# Patient Record
Sex: Female | Born: 1950 | Race: Black or African American | Hispanic: No | Marital: Single | Smoking: Current every day smoker
Health system: Southern US, Community
[De-identification: ages and names within clinical notes are randomized; demographics above are authoritative.]

## PROBLEM LIST (undated history)

## (undated) DIAGNOSIS — F329 Major depressive disorder, single episode, unspecified: Secondary | ICD-10-CM

## (undated) DIAGNOSIS — J9610 Chronic respiratory failure, unspecified whether with hypoxia or hypercapnia: Secondary | ICD-10-CM

## (undated) DIAGNOSIS — I10 Essential (primary) hypertension: Secondary | ICD-10-CM

## (undated) DIAGNOSIS — R7303 Prediabetes: Secondary | ICD-10-CM

## (undated) DIAGNOSIS — I5032 Chronic diastolic (congestive) heart failure: Secondary | ICD-10-CM

## (undated) DIAGNOSIS — N183 Chronic kidney disease, stage 3 unspecified: Secondary | ICD-10-CM

## (undated) DIAGNOSIS — K5520 Angiodysplasia of colon without hemorrhage: Secondary | ICD-10-CM

## (undated) DIAGNOSIS — F419 Anxiety disorder, unspecified: Secondary | ICD-10-CM

## (undated) DIAGNOSIS — M1990 Unspecified osteoarthritis, unspecified site: Secondary | ICD-10-CM

## (undated) DIAGNOSIS — E785 Hyperlipidemia, unspecified: Secondary | ICD-10-CM

## (undated) DIAGNOSIS — F32A Depression, unspecified: Secondary | ICD-10-CM

## (undated) DIAGNOSIS — J8410 Pulmonary fibrosis, unspecified: Secondary | ICD-10-CM

## (undated) DIAGNOSIS — D649 Anemia, unspecified: Secondary | ICD-10-CM

## (undated) DIAGNOSIS — R51 Headache: Secondary | ICD-10-CM

## (undated) DIAGNOSIS — R0602 Shortness of breath: Secondary | ICD-10-CM

## (undated) DIAGNOSIS — K219 Gastro-esophageal reflux disease without esophagitis: Secondary | ICD-10-CM

## (undated) DIAGNOSIS — I480 Paroxysmal atrial fibrillation: Secondary | ICD-10-CM

## (undated) HISTORY — DX: Chronic respiratory failure, unspecified whether with hypoxia or hypercapnia: J96.10

## (undated) HISTORY — DX: Angiodysplasia of colon without hemorrhage: K55.20

## (undated) HISTORY — PX: HEMORRHOID SURGERY: SHX153

---

## 1999-11-10 ENCOUNTER — Encounter: Payer: Self-pay | Admitting: Emergency Medicine

## 1999-11-10 ENCOUNTER — Emergency Department (HOSPITAL_COMMUNITY): Admission: EM | Admit: 1999-11-10 | Discharge: 1999-11-10 | Payer: Self-pay | Admitting: Emergency Medicine

## 2000-07-15 ENCOUNTER — Emergency Department (HOSPITAL_COMMUNITY): Admission: EM | Admit: 2000-07-15 | Discharge: 2000-07-15 | Payer: Self-pay | Admitting: Emergency Medicine

## 2001-03-19 ENCOUNTER — Encounter: Payer: Self-pay | Admitting: Emergency Medicine

## 2001-03-19 ENCOUNTER — Emergency Department (HOSPITAL_COMMUNITY): Admission: EM | Admit: 2001-03-19 | Discharge: 2001-03-19 | Payer: Self-pay | Admitting: Internal Medicine

## 2001-09-30 ENCOUNTER — Emergency Department (HOSPITAL_COMMUNITY): Admission: EM | Admit: 2001-09-30 | Discharge: 2001-09-30 | Payer: Self-pay | Admitting: *Deleted

## 2003-04-22 ENCOUNTER — Encounter: Payer: Self-pay | Admitting: Emergency Medicine

## 2003-04-22 ENCOUNTER — Emergency Department (HOSPITAL_COMMUNITY): Admission: EM | Admit: 2003-04-22 | Discharge: 2003-04-22 | Payer: Self-pay | Admitting: Emergency Medicine

## 2004-12-26 ENCOUNTER — Ambulatory Visit: Payer: Self-pay | Admitting: Family Medicine

## 2004-12-27 ENCOUNTER — Ambulatory Visit: Payer: Self-pay | Admitting: *Deleted

## 2005-01-01 ENCOUNTER — Ambulatory Visit: Payer: Self-pay | Admitting: Family Medicine

## 2005-01-07 ENCOUNTER — Ambulatory Visit: Payer: Self-pay | Admitting: Family Medicine

## 2005-01-16 ENCOUNTER — Ambulatory Visit: Payer: Self-pay | Admitting: Family Medicine

## 2005-01-21 ENCOUNTER — Ambulatory Visit: Payer: Self-pay | Admitting: Family Medicine

## 2005-01-29 ENCOUNTER — Ambulatory Visit: Payer: Self-pay | Admitting: Family Medicine

## 2005-02-04 ENCOUNTER — Ambulatory Visit: Payer: Self-pay | Admitting: Family Medicine

## 2005-02-13 ENCOUNTER — Ambulatory Visit: Payer: Self-pay | Admitting: Family Medicine

## 2005-02-28 ENCOUNTER — Ambulatory Visit: Payer: Self-pay | Admitting: Family Medicine

## 2005-03-06 ENCOUNTER — Ambulatory Visit: Payer: Self-pay | Admitting: Family Medicine

## 2005-03-24 ENCOUNTER — Ambulatory Visit: Payer: Self-pay | Admitting: Family Medicine

## 2005-03-24 LAB — CONVERTED CEMR LAB: Pap Smear: NORMAL

## 2005-04-01 ENCOUNTER — Ambulatory Visit (HOSPITAL_COMMUNITY): Admission: RE | Admit: 2005-04-01 | Discharge: 2005-04-01 | Payer: Self-pay | Admitting: Family Medicine

## 2005-04-02 ENCOUNTER — Ambulatory Visit: Payer: Self-pay | Admitting: Family Medicine

## 2005-04-02 ENCOUNTER — Ambulatory Visit (HOSPITAL_COMMUNITY): Admission: RE | Admit: 2005-04-02 | Discharge: 2005-04-02 | Payer: Self-pay | Admitting: Internal Medicine

## 2005-04-10 ENCOUNTER — Ambulatory Visit: Payer: Self-pay | Admitting: Family Medicine

## 2005-04-14 ENCOUNTER — Ambulatory Visit: Payer: Self-pay | Admitting: Family Medicine

## 2005-04-15 ENCOUNTER — Ambulatory Visit (HOSPITAL_COMMUNITY): Admission: RE | Admit: 2005-04-15 | Discharge: 2005-04-15 | Payer: Self-pay | Admitting: Internal Medicine

## 2005-04-17 ENCOUNTER — Ambulatory Visit: Payer: Self-pay | Admitting: Family Medicine

## 2005-04-28 ENCOUNTER — Ambulatory Visit: Payer: Self-pay | Admitting: Family Medicine

## 2005-05-05 ENCOUNTER — Ambulatory Visit: Payer: Self-pay | Admitting: Family Medicine

## 2005-06-02 ENCOUNTER — Ambulatory Visit: Payer: Self-pay | Admitting: Family Medicine

## 2005-06-30 ENCOUNTER — Ambulatory Visit: Payer: Self-pay | Admitting: Family Medicine

## 2005-07-23 ENCOUNTER — Ambulatory Visit: Payer: Self-pay | Admitting: Internal Medicine

## 2005-08-21 ENCOUNTER — Ambulatory Visit: Payer: Self-pay | Admitting: Internal Medicine

## 2005-09-17 ENCOUNTER — Ambulatory Visit: Payer: Self-pay | Admitting: Family Medicine

## 2005-10-20 ENCOUNTER — Ambulatory Visit: Payer: Self-pay | Admitting: Family Medicine

## 2005-11-19 ENCOUNTER — Ambulatory Visit: Payer: Self-pay | Admitting: Family Medicine

## 2005-12-11 ENCOUNTER — Ambulatory Visit: Payer: Self-pay | Admitting: Family Medicine

## 2006-01-01 ENCOUNTER — Ambulatory Visit: Payer: Self-pay | Admitting: Family Medicine

## 2006-01-29 ENCOUNTER — Ambulatory Visit: Payer: Self-pay | Admitting: Family Medicine

## 2006-02-19 ENCOUNTER — Ambulatory Visit: Payer: Self-pay | Admitting: Family Medicine

## 2006-02-23 ENCOUNTER — Ambulatory Visit: Payer: Self-pay | Admitting: Family Medicine

## 2006-03-02 ENCOUNTER — Ambulatory Visit: Payer: Self-pay | Admitting: Family Medicine

## 2006-03-09 ENCOUNTER — Ambulatory Visit: Payer: Self-pay | Admitting: Family Medicine

## 2006-03-12 ENCOUNTER — Ambulatory Visit: Payer: Self-pay | Admitting: Family Medicine

## 2006-03-19 ENCOUNTER — Ambulatory Visit: Payer: Self-pay | Admitting: Family Medicine

## 2006-04-02 ENCOUNTER — Ambulatory Visit: Payer: Self-pay | Admitting: Internal Medicine

## 2006-04-23 ENCOUNTER — Ambulatory Visit: Payer: Self-pay | Admitting: Internal Medicine

## 2006-05-21 ENCOUNTER — Ambulatory Visit: Payer: Self-pay | Admitting: Internal Medicine

## 2006-06-16 ENCOUNTER — Emergency Department (HOSPITAL_COMMUNITY): Admission: EM | Admit: 2006-06-16 | Discharge: 2006-06-16 | Payer: Self-pay | Admitting: Emergency Medicine

## 2006-06-18 ENCOUNTER — Ambulatory Visit: Payer: Self-pay | Admitting: Internal Medicine

## 2006-07-13 ENCOUNTER — Ambulatory Visit: Payer: Self-pay | Admitting: Family Medicine

## 2006-07-15 ENCOUNTER — Ambulatory Visit: Payer: Self-pay | Admitting: Family Medicine

## 2006-07-22 ENCOUNTER — Ambulatory Visit: Payer: Self-pay | Admitting: Family Medicine

## 2006-08-18 ENCOUNTER — Ambulatory Visit: Payer: Self-pay | Admitting: Family Medicine

## 2006-09-16 ENCOUNTER — Ambulatory Visit: Payer: Self-pay | Admitting: Family Medicine

## 2006-10-14 ENCOUNTER — Ambulatory Visit: Payer: Self-pay | Admitting: Family Medicine

## 2006-11-11 ENCOUNTER — Ambulatory Visit: Payer: Self-pay | Admitting: Family Medicine

## 2006-12-09 ENCOUNTER — Ambulatory Visit: Payer: Self-pay | Admitting: Family Medicine

## 2007-01-06 ENCOUNTER — Ambulatory Visit: Payer: Self-pay | Admitting: Family Medicine

## 2007-01-16 ENCOUNTER — Emergency Department (HOSPITAL_COMMUNITY): Admission: EM | Admit: 2007-01-16 | Discharge: 2007-01-16 | Payer: Self-pay | Admitting: Emergency Medicine

## 2007-01-20 ENCOUNTER — Ambulatory Visit: Payer: Self-pay | Admitting: Family Medicine

## 2007-02-15 ENCOUNTER — Ambulatory Visit: Payer: Self-pay | Admitting: Family Medicine

## 2007-03-22 ENCOUNTER — Ambulatory Visit: Payer: Self-pay | Admitting: Family Medicine

## 2007-04-05 ENCOUNTER — Ambulatory Visit: Payer: Self-pay | Admitting: Family Medicine

## 2007-05-03 ENCOUNTER — Ambulatory Visit: Payer: Self-pay | Admitting: Family Medicine

## 2007-05-12 ENCOUNTER — Ambulatory Visit: Payer: Self-pay | Admitting: Family Medicine

## 2007-05-13 ENCOUNTER — Encounter (INDEPENDENT_AMBULATORY_CARE_PROVIDER_SITE_OTHER): Payer: Self-pay | Admitting: Family Medicine

## 2007-05-13 DIAGNOSIS — Z8679 Personal history of other diseases of the circulatory system: Secondary | ICD-10-CM

## 2007-05-13 DIAGNOSIS — M109 Gout, unspecified: Secondary | ICD-10-CM | POA: Insufficient documentation

## 2007-05-13 DIAGNOSIS — I10 Essential (primary) hypertension: Secondary | ICD-10-CM | POA: Insufficient documentation

## 2007-05-13 DIAGNOSIS — F17210 Nicotine dependence, cigarettes, uncomplicated: Secondary | ICD-10-CM

## 2007-05-13 DIAGNOSIS — E785 Hyperlipidemia, unspecified: Secondary | ICD-10-CM | POA: Insufficient documentation

## 2007-05-13 DIAGNOSIS — K644 Residual hemorrhoidal skin tags: Secondary | ICD-10-CM | POA: Insufficient documentation

## 2007-05-13 DIAGNOSIS — Z9189 Other specified personal risk factors, not elsewhere classified: Secondary | ICD-10-CM | POA: Insufficient documentation

## 2007-05-13 DIAGNOSIS — F329 Major depressive disorder, single episode, unspecified: Secondary | ICD-10-CM | POA: Insufficient documentation

## 2007-05-26 ENCOUNTER — Ambulatory Visit: Payer: Self-pay | Admitting: Family Medicine

## 2007-06-09 ENCOUNTER — Ambulatory Visit: Payer: Self-pay | Admitting: Family Medicine

## 2007-06-16 ENCOUNTER — Ambulatory Visit: Payer: Self-pay | Admitting: Family Medicine

## 2007-06-29 ENCOUNTER — Ambulatory Visit: Payer: Self-pay | Admitting: Internal Medicine

## 2007-06-29 LAB — CONVERTED CEMR LAB
Alkaline Phosphatase: 86 units/L (ref 39–117)
CO2: 23 meq/L (ref 19–32)
Cholesterol: 155 mg/dL (ref 0–200)
Creatinine, Ser: 1.03 mg/dL (ref 0.40–1.20)
Glucose, Bld: 83 mg/dL (ref 70–99)
HDL: 47 mg/dL (ref >39)
LDL Cholesterol: 88 mg/dL (ref 0–99)
Sodium: 143 meq/L (ref 135–145)
Total Bilirubin: 0.4 mg/dL (ref 0.3–1.2)
Total CHOL/HDL Ratio: 3.3
Total Protein: 7.4 g/dL (ref 6.0–8.3)
Triglycerides: 101 mg/dL (ref <150)
VLDL: 20 mg/dL (ref 0–40)

## 2007-07-14 ENCOUNTER — Ambulatory Visit: Payer: Self-pay | Admitting: Family Medicine

## 2007-08-11 ENCOUNTER — Encounter (INDEPENDENT_AMBULATORY_CARE_PROVIDER_SITE_OTHER): Payer: Self-pay | Admitting: *Deleted

## 2007-08-11 ENCOUNTER — Ambulatory Visit: Payer: Self-pay | Admitting: Internal Medicine

## 2007-09-21 ENCOUNTER — Ambulatory Visit: Payer: Self-pay | Admitting: Internal Medicine

## 2007-10-18 ENCOUNTER — Ambulatory Visit: Payer: Self-pay | Admitting: Internal Medicine

## 2007-11-15 ENCOUNTER — Ambulatory Visit: Payer: Self-pay | Admitting: Internal Medicine

## 2007-12-13 ENCOUNTER — Ambulatory Visit: Payer: Self-pay | Admitting: Internal Medicine

## 2008-01-13 ENCOUNTER — Ambulatory Visit: Payer: Self-pay | Admitting: Family Medicine

## 2008-02-10 ENCOUNTER — Ambulatory Visit: Payer: Self-pay | Admitting: Internal Medicine

## 2008-03-09 ENCOUNTER — Ambulatory Visit: Payer: Self-pay | Admitting: Family Medicine

## 2008-04-06 ENCOUNTER — Ambulatory Visit: Payer: Self-pay | Admitting: Internal Medicine

## 2008-05-09 ENCOUNTER — Ambulatory Visit: Payer: Self-pay | Admitting: Internal Medicine

## 2008-06-06 ENCOUNTER — Ambulatory Visit: Payer: Self-pay | Admitting: Internal Medicine

## 2008-07-04 ENCOUNTER — Encounter (INDEPENDENT_AMBULATORY_CARE_PROVIDER_SITE_OTHER): Payer: Self-pay | Admitting: Family Medicine

## 2008-07-04 ENCOUNTER — Ambulatory Visit: Payer: Self-pay | Admitting: Internal Medicine

## 2008-08-07 ENCOUNTER — Ambulatory Visit: Payer: Self-pay | Admitting: Family Medicine

## 2008-08-07 LAB — CONVERTED CEMR LAB: Prothrombin Time: 29.4 s — ABNORMAL HIGH (ref 11.6–15.2)

## 2008-08-30 ENCOUNTER — Ambulatory Visit: Payer: Self-pay | Admitting: Family Medicine

## 2008-08-30 LAB — CONVERTED CEMR LAB
Albumin: 3.9 g/dL (ref 3.5–5.2)
Alkaline Phosphatase: 75 units/L (ref 39–117)
BUN: 13 mg/dL (ref 6–23)
Basophils Absolute: 0.1 10*3/uL (ref 0.0–0.1)
Basophils Relative: 1 % (ref 0–1)
CO2: 21 meq/L (ref 19–32)
Eosinophils Relative: 4 % (ref 0–5)
Glucose, Bld: 102 mg/dL — ABNORMAL HIGH (ref 70–99)
HCT: 39 % (ref 36.0–46.0)
Hemoglobin: 12.4 g/dL (ref 12.0–15.0)
MCHC: 31.8 g/dL (ref 30.0–36.0)
MCV: 81.8 fL (ref 78.0–100.0)
Monocytes Absolute: 0.5 10*3/uL (ref 0.1–1.0)
Monocytes Relative: 6 % (ref 3–12)
Neutro Abs: 4.9 10*3/uL (ref 1.7–7.7)
RBC: 4.77 M/uL (ref 3.87–5.11)
RDW: 15.8 % — ABNORMAL HIGH (ref 11.5–15.5)
Sodium: 141 meq/L (ref 135–145)
TSH: 0.845 microintl units/mL (ref 0.350–4.50)
Total Bilirubin: 0.4 mg/dL (ref 0.3–1.2)
Total Protein: 7 g/dL (ref 6.0–8.3)

## 2008-09-26 ENCOUNTER — Ambulatory Visit: Payer: Self-pay | Admitting: Family Medicine

## 2008-09-26 LAB — CONVERTED CEMR LAB
INR: 1.9 — ABNORMAL HIGH (ref 0.0–1.5)
Prothrombin Time: 23.2 s — ABNORMAL HIGH (ref 11.6–15.2)

## 2008-10-30 ENCOUNTER — Ambulatory Visit: Payer: Self-pay | Admitting: Internal Medicine

## 2008-10-30 ENCOUNTER — Encounter: Payer: Self-pay | Admitting: Family Medicine

## 2008-10-30 LAB — CONVERTED CEMR LAB: INR: 2.1 — ABNORMAL HIGH (ref 0.0–1.5)

## 2008-12-05 ENCOUNTER — Ambulatory Visit: Payer: Self-pay | Admitting: Family Medicine

## 2008-12-20 ENCOUNTER — Ambulatory Visit: Payer: Self-pay | Admitting: Family Medicine

## 2009-01-17 ENCOUNTER — Ambulatory Visit: Payer: Self-pay | Admitting: Adult Health

## 2009-01-17 ENCOUNTER — Ambulatory Visit: Payer: Self-pay | Admitting: Family Medicine

## 2009-02-14 ENCOUNTER — Ambulatory Visit: Payer: Self-pay | Admitting: Family Medicine

## 2009-03-14 ENCOUNTER — Ambulatory Visit: Payer: Self-pay | Admitting: Family Medicine

## 2009-04-11 ENCOUNTER — Ambulatory Visit: Payer: Self-pay | Admitting: Family Medicine

## 2009-04-19 ENCOUNTER — Inpatient Hospital Stay (HOSPITAL_COMMUNITY): Admission: EM | Admit: 2009-04-19 | Discharge: 2009-04-21 | Payer: Self-pay | Admitting: Cardiovascular Disease

## 2009-04-19 ENCOUNTER — Encounter: Payer: Self-pay | Admitting: Emergency Medicine

## 2009-04-24 ENCOUNTER — Ambulatory Visit: Payer: Self-pay | Admitting: Internal Medicine

## 2009-05-14 ENCOUNTER — Ambulatory Visit: Payer: Self-pay | Admitting: Family Medicine

## 2009-05-14 ENCOUNTER — Ambulatory Visit: Payer: Self-pay | Admitting: Internal Medicine

## 2009-06-04 ENCOUNTER — Ambulatory Visit: Payer: Self-pay | Admitting: Family Medicine

## 2009-07-02 ENCOUNTER — Ambulatory Visit: Payer: Self-pay | Admitting: Family Medicine

## 2009-07-09 ENCOUNTER — Emergency Department (HOSPITAL_COMMUNITY): Admission: EM | Admit: 2009-07-09 | Discharge: 2009-07-09 | Payer: Self-pay | Admitting: Emergency Medicine

## 2009-07-25 ENCOUNTER — Ambulatory Visit: Payer: Self-pay | Admitting: Internal Medicine

## 2009-07-25 ENCOUNTER — Ambulatory Visit: Payer: Self-pay | Admitting: Family Medicine

## 2009-07-25 LAB — CONVERTED CEMR LAB
AST: 16 units/L (ref 0–37)
Alkaline Phosphatase: 56 units/L (ref 39–117)
BUN: 7 mg/dL (ref 6–23)
Basophils Absolute: 0 10*3/uL (ref 0.0–0.1)
Creatinine, Ser: 0.88 mg/dL (ref 0.40–1.20)
Eosinophils Absolute: 0.2 10*3/uL (ref 0.0–0.7)
Eosinophils Relative: 2 % (ref 0–5)
HCT: 38.3 % (ref 36.0–46.0)
HDL: 48 mg/dL (ref >39)
LDL Cholesterol: 83 mg/dL (ref 0–99)
MCV: 82.2 fL (ref 78.0–100.0)
Neutrophils Relative %: 72 % (ref 43–77)
Platelets: 303 10*3/uL (ref 150–400)
RDW: 16.7 % — ABNORMAL HIGH (ref 11.5–15.5)
TSH: 1.061 microintl units/mL (ref 0.350–4.500)
Total Bilirubin: 0.3 mg/dL (ref 0.3–1.2)
Total CHOL/HDL Ratio: 3.1
VLDL: 18 mg/dL (ref 0–40)

## 2009-08-22 ENCOUNTER — Ambulatory Visit: Payer: Self-pay | Admitting: Family Medicine

## 2009-09-19 ENCOUNTER — Ambulatory Visit: Payer: Self-pay | Admitting: Family Medicine

## 2009-10-17 ENCOUNTER — Ambulatory Visit: Payer: Self-pay | Admitting: Family Medicine

## 2009-10-31 ENCOUNTER — Ambulatory Visit: Payer: Self-pay | Admitting: Family Medicine

## 2009-11-01 ENCOUNTER — Ambulatory Visit: Payer: Self-pay | Admitting: Internal Medicine

## 2009-11-14 ENCOUNTER — Ambulatory Visit: Payer: Self-pay | Admitting: Internal Medicine

## 2009-12-05 ENCOUNTER — Ambulatory Visit: Payer: Self-pay | Admitting: Internal Medicine

## 2010-01-02 ENCOUNTER — Ambulatory Visit: Payer: Self-pay | Admitting: Internal Medicine

## 2010-01-02 ENCOUNTER — Encounter (INDEPENDENT_AMBULATORY_CARE_PROVIDER_SITE_OTHER): Payer: Self-pay | Admitting: Adult Health

## 2010-01-02 LAB — CONVERTED CEMR LAB: Chlamydia, DNA Probe: NEGATIVE

## 2010-01-07 ENCOUNTER — Ambulatory Visit (HOSPITAL_COMMUNITY): Admission: RE | Admit: 2010-01-07 | Discharge: 2010-01-07 | Payer: Self-pay | Admitting: Internal Medicine

## 2010-01-30 ENCOUNTER — Ambulatory Visit: Payer: Self-pay | Admitting: Internal Medicine

## 2010-01-30 ENCOUNTER — Ambulatory Visit: Payer: Self-pay | Admitting: Adult Health

## 2010-01-30 LAB — CONVERTED CEMR LAB
Albumin: 4 g/dL (ref 3.5–5.2)
Alkaline Phosphatase: 78 units/L (ref 39–117)
CO2: 24 meq/L (ref 19–32)
Chloride: 109 meq/L (ref 96–112)
Glucose, Bld: 88 mg/dL (ref 70–99)
Hgb A1c MFr Bld: 6.2 % — ABNORMAL HIGH (ref 4.6–6.1)
LDL Cholesterol: 74 mg/dL (ref 0–99)
Potassium: 4.1 meq/L (ref 3.5–5.3)
Sodium: 143 meq/L (ref 135–145)
Total Protein: 6.9 g/dL (ref 6.0–8.3)
Triglycerides: 72 mg/dL (ref <150)

## 2010-02-28 ENCOUNTER — Ambulatory Visit: Payer: Self-pay | Admitting: Adult Health

## 2010-03-28 ENCOUNTER — Ambulatory Visit: Payer: Self-pay | Admitting: Adult Health

## 2010-04-23 ENCOUNTER — Ambulatory Visit: Payer: Self-pay | Admitting: Internal Medicine

## 2010-04-25 ENCOUNTER — Ambulatory Visit: Payer: Self-pay | Admitting: Adult Health

## 2010-05-18 ENCOUNTER — Inpatient Hospital Stay (HOSPITAL_COMMUNITY): Admission: EM | Admit: 2010-05-18 | Discharge: 2010-05-27 | Payer: Self-pay | Admitting: Emergency Medicine

## 2010-05-18 ENCOUNTER — Emergency Department (HOSPITAL_COMMUNITY): Admission: EM | Admit: 2010-05-18 | Discharge: 2010-05-18 | Payer: Self-pay | Admitting: Family Medicine

## 2010-05-19 ENCOUNTER — Ambulatory Visit: Payer: Self-pay | Admitting: Internal Medicine

## 2010-05-20 ENCOUNTER — Encounter (INDEPENDENT_AMBULATORY_CARE_PROVIDER_SITE_OTHER): Payer: Self-pay | Admitting: Family Medicine

## 2010-05-23 ENCOUNTER — Ambulatory Visit: Payer: Self-pay | Admitting: Psychiatry

## 2010-05-24 ENCOUNTER — Ambulatory Visit: Payer: Self-pay | Admitting: Vascular Surgery

## 2010-05-24 ENCOUNTER — Ambulatory Visit: Payer: Self-pay | Admitting: Psychiatry

## 2010-05-24 ENCOUNTER — Encounter (INDEPENDENT_AMBULATORY_CARE_PROVIDER_SITE_OTHER): Payer: Self-pay | Admitting: Internal Medicine

## 2010-06-03 ENCOUNTER — Ambulatory Visit: Payer: Self-pay | Admitting: Family Medicine

## 2010-06-04 ENCOUNTER — Encounter (INDEPENDENT_AMBULATORY_CARE_PROVIDER_SITE_OTHER): Payer: Self-pay | Admitting: Adult Health

## 2010-06-04 LAB — CONVERTED CEMR LAB
BUN: 15 mg/dL (ref 6–23)
CO2: 23 meq/L (ref 19–32)
Calcium: 9.7 mg/dL (ref 8.4–10.5)
Chloride: 106 meq/L (ref 96–112)
Creatinine, Ser: 0.97 mg/dL (ref 0.40–1.20)
Hemoglobin: 12 g/dL (ref 12.0–15.0)
Lymphocytes Relative: 18 % (ref 12–46)
MCHC: 31.3 g/dL (ref 30.0–36.0)
Monocytes Absolute: 0.4 10*3/uL (ref 0.1–1.0)
Monocytes Relative: 4 % (ref 3–12)
Neutro Abs: 8.3 10*3/uL — ABNORMAL HIGH (ref 1.7–7.7)
RBC: 4.43 M/uL (ref 3.87–5.11)

## 2010-07-01 ENCOUNTER — Ambulatory Visit: Payer: Self-pay | Admitting: Internal Medicine

## 2010-07-01 ENCOUNTER — Ambulatory Visit: Payer: Self-pay | Admitting: Adult Health

## 2010-07-08 ENCOUNTER — Ambulatory Visit: Payer: Self-pay | Admitting: Internal Medicine

## 2010-07-08 LAB — CONVERTED CEMR LAB
Eosinophils Relative: 2 % (ref 0–5)
HCT: 36.1 % (ref 36.0–46.0)
Hemoglobin: 11.3 g/dL — ABNORMAL LOW (ref 12.0–15.0)
Lymphocytes Relative: 16 % (ref 12–46)
Lymphs Abs: 1.7 10*3/uL (ref 0.7–4.0)
Monocytes Absolute: 0.6 10*3/uL (ref 0.1–1.0)
TSH: 1.488 microintl units/mL (ref 0.350–4.500)
WBC: 10.7 10*3/uL — ABNORMAL HIGH (ref 4.0–10.5)

## 2010-07-15 ENCOUNTER — Telehealth (INDEPENDENT_AMBULATORY_CARE_PROVIDER_SITE_OTHER): Payer: Self-pay | Admitting: *Deleted

## 2010-07-22 ENCOUNTER — Ambulatory Visit: Payer: Self-pay | Admitting: Internal Medicine

## 2010-08-08 ENCOUNTER — Encounter (INDEPENDENT_AMBULATORY_CARE_PROVIDER_SITE_OTHER): Payer: Self-pay | Admitting: Internal Medicine

## 2010-08-29 ENCOUNTER — Encounter (INDEPENDENT_AMBULATORY_CARE_PROVIDER_SITE_OTHER): Payer: Self-pay | Admitting: Internal Medicine

## 2010-08-29 LAB — CONVERTED CEMR LAB
INR: 2.25 — ABNORMAL HIGH (ref <1.50)
Prothrombin Time: 25 s — ABNORMAL HIGH (ref 11.6–15.2)

## 2010-09-23 ENCOUNTER — Encounter (INDEPENDENT_AMBULATORY_CARE_PROVIDER_SITE_OTHER): Payer: Self-pay | Admitting: Internal Medicine

## 2010-09-23 LAB — CONVERTED CEMR LAB
INR: 2.52 — ABNORMAL HIGH
Prothrombin Time: 27.3 s — ABNORMAL HIGH

## 2010-12-15 ENCOUNTER — Encounter: Payer: Self-pay | Admitting: Internal Medicine

## 2010-12-24 NOTE — Progress Notes (Signed)
Summary: triage/bruise/coumadin patient  Phone Note Call from Patient   Caller: Patient Reason for Call: Talk to Nurse Summary of Call: Patient came in today to pick up medications and wanted to talk to nurse. She is on coumadin and has a large bruise approx. 13cm x 5cm on her right leg. She isn't sure how she got it. She just noticed it today. Patient denies any other s/s, SOB, other bruising or bleeding when brushing her teeth. Her next PT/INR appointment is next Monday 08/22/10. I discussed this with pharmacist Toni Amend. Patient to watch bruise and let us know if it gets larger or if anyother areas appear, or any bleeding. Patient states understanding and says she is taking her coumadin as prescribed. Initial call taken by: Conchita Paris,  July 15, 2010 3:24 PM

## 2011-01-13 ENCOUNTER — Inpatient Hospital Stay (HOSPITAL_COMMUNITY)
Admission: EM | Admit: 2011-01-13 | Discharge: 2011-01-24 | DRG: 347 | Disposition: A | Discharge status: Home Health | Payer: Medicaid Other | Attending: Emergency Medicine | Admitting: Emergency Medicine

## 2011-01-13 DIAGNOSIS — K2990 Gastroduodenitis, unspecified, without bleeding: Secondary | ICD-10-CM

## 2011-01-13 DIAGNOSIS — I2510 Atherosclerotic heart disease of native coronary artery without angina pectoris: Secondary | ICD-10-CM | POA: Diagnosis present

## 2011-01-13 DIAGNOSIS — K2970 Gastritis, unspecified, without bleeding: Secondary | ICD-10-CM

## 2011-01-13 DIAGNOSIS — R791 Abnormal coagulation profile: Secondary | ICD-10-CM | POA: Diagnosis present

## 2011-01-13 DIAGNOSIS — F209 Schizophrenia, unspecified: Secondary | ICD-10-CM | POA: Diagnosis present

## 2011-01-13 DIAGNOSIS — E876 Hypokalemia: Secondary | ICD-10-CM | POA: Diagnosis present

## 2011-01-13 DIAGNOSIS — E785 Hyperlipidemia, unspecified: Secondary | ICD-10-CM | POA: Diagnosis present

## 2011-01-13 DIAGNOSIS — R578 Other shock: Secondary | ICD-10-CM | POA: Diagnosis present

## 2011-01-13 DIAGNOSIS — R7309 Other abnormal glucose: Secondary | ICD-10-CM | POA: Diagnosis present

## 2011-01-13 DIAGNOSIS — I10 Essential (primary) hypertension: Secondary | ICD-10-CM | POA: Diagnosis present

## 2011-01-13 DIAGNOSIS — F17200 Nicotine dependence, unspecified, uncomplicated: Secondary | ICD-10-CM | POA: Diagnosis present

## 2011-01-13 DIAGNOSIS — F329 Major depressive disorder, single episode, unspecified: Secondary | ICD-10-CM | POA: Diagnosis present

## 2011-01-13 DIAGNOSIS — D649 Anemia, unspecified: Secondary | ICD-10-CM

## 2011-01-13 DIAGNOSIS — Z7901 Long term (current) use of anticoagulants: Secondary | ICD-10-CM

## 2011-01-13 DIAGNOSIS — D72829 Elevated white blood cell count, unspecified: Secondary | ICD-10-CM | POA: Diagnosis present

## 2011-01-13 DIAGNOSIS — I4891 Unspecified atrial fibrillation: Secondary | ICD-10-CM | POA: Diagnosis present

## 2011-01-13 DIAGNOSIS — T45515A Adverse effect of anticoagulants, initial encounter: Secondary | ICD-10-CM | POA: Diagnosis present

## 2011-01-13 DIAGNOSIS — K921 Melena: Secondary | ICD-10-CM

## 2011-01-13 DIAGNOSIS — F3289 Other specified depressive episodes: Secondary | ICD-10-CM | POA: Diagnosis present

## 2011-01-13 DIAGNOSIS — K644 Residual hemorrhoidal skin tags: Secondary | ICD-10-CM | POA: Diagnosis present

## 2011-01-13 DIAGNOSIS — I509 Heart failure, unspecified: Secondary | ICD-10-CM | POA: Diagnosis present

## 2011-01-13 DIAGNOSIS — I5033 Acute on chronic diastolic (congestive) heart failure: Secondary | ICD-10-CM | POA: Diagnosis not present

## 2011-01-13 DIAGNOSIS — N179 Acute kidney failure, unspecified: Secondary | ICD-10-CM | POA: Diagnosis present

## 2011-01-13 DIAGNOSIS — D62 Acute posthemorrhagic anemia: Secondary | ICD-10-CM | POA: Diagnosis present

## 2011-01-13 DIAGNOSIS — K648 Other hemorrhoids: Principal | ICD-10-CM | POA: Diagnosis present

## 2011-01-13 LAB — DIFFERENTIAL
Basophils Absolute: 0 10*3/uL (ref 0.0–0.1)
Lymphs Abs: 1.8 10*3/uL (ref 0.7–4.0)
Monocytes Absolute: 0.8 10*3/uL (ref 0.1–1.0)
Monocytes Relative: 4 % (ref 3–12)
Neutrophils Relative %: 87 % — ABNORMAL HIGH (ref 43–77)

## 2011-01-13 LAB — COMPREHENSIVE METABOLIC PANEL
AST: 17 U/L (ref 0–37)
Albumin: 2.4 g/dL — ABNORMAL LOW (ref 3.5–5.2)
BUN: 111 mg/dL — ABNORMAL HIGH (ref 6–23)
Calcium: 7.7 mg/dL — ABNORMAL LOW (ref 8.4–10.5)
Chloride: 98 mEq/L (ref 96–112)
Creatinine, Ser: 2.87 mg/dL — ABNORMAL HIGH (ref 0.4–1.2)
GFR calc Af Amer: 20 mL/min — ABNORMAL LOW (ref >60)
Total Bilirubin: 0.4 mg/dL (ref 0.3–1.2)

## 2011-01-13 LAB — POCT I-STAT, CHEM 8
Calcium, Ion: 0.99 mmol/L — ABNORMAL LOW (ref 1.12–1.32)
Glucose, Bld: 120 mg/dL — ABNORMAL HIGH (ref 70–99)
HCT: 15 % — ABNORMAL LOW (ref 36.0–46.0)
TCO2: 20 mmol/L (ref 0–100)

## 2011-01-13 LAB — URINALYSIS, ROUTINE W REFLEX MICROSCOPIC
Protein, ur: NEGATIVE mg/dL
Specific Gravity, Urine: 1.019 (ref 1.005–1.030)
Urine Glucose, Fasting: NEGATIVE mg/dL
Urobilinogen, UA: 0.2 mg/dL (ref 0.0–1.0)

## 2011-01-13 LAB — CBC
MCHC: 34.2 g/dL (ref 30.0–36.0)
RDW: 15.4 % (ref 11.5–15.5)
WBC: 20.2 10*3/uL — ABNORMAL HIGH (ref 4.0–10.5)

## 2011-01-13 LAB — ABO/RH: ABO/RH(D): O POS

## 2011-01-13 LAB — PROTIME-INR
INR: >10 (ref 0.00–1.49)
Prothrombin Time: >90 seconds — ABNORMAL HIGH (ref 11.6–15.2)

## 2011-01-13 LAB — OCCULT BLOOD, POC DEVICE: Fecal Occult Bld: POSITIVE

## 2011-01-13 LAB — CK TOTAL AND CKMB (NOT AT ARMC)
CK, MB: 1.7 ng/mL (ref 0.3–4.0)
Relative Index: INVALID (ref 0.0–2.5)
Total CK: 71 U/L (ref 7–177)

## 2011-01-14 ENCOUNTER — Encounter: Payer: Self-pay | Admitting: Gastroenterology

## 2011-01-14 DIAGNOSIS — K2990 Gastroduodenitis, unspecified, without bleeding: Secondary | ICD-10-CM | POA: Diagnosis not present

## 2011-01-14 DIAGNOSIS — K921 Melena: Secondary | ICD-10-CM | POA: Diagnosis not present

## 2011-01-14 DIAGNOSIS — D649 Anemia, unspecified: Secondary | ICD-10-CM | POA: Diagnosis not present

## 2011-01-14 DIAGNOSIS — K2970 Gastritis, unspecified, without bleeding: Secondary | ICD-10-CM

## 2011-01-14 LAB — PREPARE FRESH FROZEN PLASMA
Unit division: 0
Unit division: 0

## 2011-01-14 LAB — IRON AND TIBC
Iron: 30 ug/dL — ABNORMAL LOW (ref 42–135)
Saturation Ratios: 8 % — ABNORMAL LOW (ref 20–55)
TIBC: 363 ug/dL (ref 250–470)
UIBC: 333 ug/dL

## 2011-01-14 LAB — PROTIME-INR
INR: 1.95 — ABNORMAL HIGH (ref 0.00–1.49)
Prothrombin Time: 22.4 seconds — ABNORMAL HIGH (ref 11.6–15.2)

## 2011-01-14 LAB — LIPID PANEL
Cholesterol: 113 mg/dL (ref 0–200)
Total CHOL/HDL Ratio: 2.6 RATIO

## 2011-01-14 LAB — HEMOGLOBIN AND HEMATOCRIT, BLOOD: Hemoglobin: 9.3 g/dL — ABNORMAL LOW (ref 12.0–15.0)

## 2011-01-14 LAB — BASIC METABOLIC PANEL
CO2: 25 mEq/L (ref 19–32)
Calcium: 7.9 mg/dL — ABNORMAL LOW (ref 8.4–10.5)
Creatinine, Ser: 1.12 mg/dL (ref 0.4–1.2)
GFR calc Af Amer: >60 mL/min (ref >60)

## 2011-01-14 LAB — CBC
Platelets: 211 10*3/uL (ref 150–400)
RDW: 15.9 % — ABNORMAL HIGH (ref 11.5–15.5)
WBC: 12.9 10*3/uL — ABNORMAL HIGH (ref 4.0–10.5)

## 2011-01-15 ENCOUNTER — Encounter: Payer: Self-pay | Admitting: Gastroenterology

## 2011-01-15 DIAGNOSIS — K623 Rectal prolapse: Secondary | ICD-10-CM

## 2011-01-15 DIAGNOSIS — K922 Gastrointestinal hemorrhage, unspecified: Secondary | ICD-10-CM | POA: Diagnosis not present

## 2011-01-15 DIAGNOSIS — K648 Other hemorrhoids: Secondary | ICD-10-CM

## 2011-01-15 LAB — BASIC METABOLIC PANEL
BUN: 12 mg/dL (ref 6–23)
CO2: 28 mEq/L (ref 19–32)
Chloride: 109 mEq/L (ref 96–112)
GFR calc non Af Amer: >60 mL/min (ref >60)
Glucose, Bld: 90 mg/dL (ref 70–99)
Potassium: 3.5 mEq/L (ref 3.5–5.1)

## 2011-01-15 LAB — CROSSMATCH
Unit division: 0
Unit division: 0

## 2011-01-15 LAB — CBC
HCT: 25.9 % — ABNORMAL LOW (ref 36.0–46.0)
Hemoglobin: 8.6 g/dL — ABNORMAL LOW (ref 12.0–15.0)
MCV: 84.9 fL (ref 78.0–100.0)
RDW: 15.6 % — ABNORMAL HIGH (ref 11.5–15.5)
WBC: 16.2 10*3/uL — ABNORMAL HIGH (ref 4.0–10.5)

## 2011-01-15 LAB — PREPARE FRESH FROZEN PLASMA: Unit division: 0

## 2011-01-16 ENCOUNTER — Inpatient Hospital Stay (HOSPITAL_COMMUNITY): Payer: Medicaid Other

## 2011-01-16 ENCOUNTER — Other Ambulatory Visit: Payer: Self-pay | Admitting: General Surgery

## 2011-01-16 LAB — BASIC METABOLIC PANEL
BUN: 9 mg/dL (ref 6–23)
CO2: 26 mEq/L (ref 19–32)
Chloride: 107 mEq/L (ref 96–112)
Creatinine, Ser: 0.88 mg/dL (ref 0.4–1.2)
Glucose, Bld: 96 mg/dL (ref 70–99)
Potassium: 3.5 mEq/L (ref 3.5–5.1)

## 2011-01-16 LAB — CBC
HCT: 25.1 % — ABNORMAL LOW (ref 36.0–46.0)
Hemoglobin: 8 g/dL — ABNORMAL LOW (ref 12.0–15.0)
MCH: 27.3 pg (ref 26.0–34.0)
MCHC: 31.9 g/dL (ref 30.0–36.0)
MCV: 85.7 fL (ref 78.0–100.0)
RDW: 16.1 % — ABNORMAL HIGH (ref 11.5–15.5)

## 2011-01-17 DIAGNOSIS — I4891 Unspecified atrial fibrillation: Secondary | ICD-10-CM

## 2011-01-17 LAB — MAGNESIUM: Magnesium: 1.5 mg/dL (ref 1.5–2.5)

## 2011-01-17 LAB — CBC
HCT: 27.8 % — ABNORMAL LOW (ref 36.0–46.0)
Hemoglobin: 9.1 g/dL — ABNORMAL LOW (ref 12.0–15.0)
MCHC: 32.7 g/dL (ref 30.0–36.0)
MCV: 85.5 fL (ref 78.0–100.0)
RDW: 15.6 % — ABNORMAL HIGH (ref 11.5–15.5)

## 2011-01-17 LAB — BASIC METABOLIC PANEL
BUN: 6 mg/dL (ref 6–23)
CO2: 26 mEq/L (ref 19–32)
Calcium: 8.6 mg/dL (ref 8.4–10.5)
GFR calc non Af Amer: 55 mL/min — ABNORMAL LOW (ref >60)
Glucose, Bld: 164 mg/dL — ABNORMAL HIGH (ref 70–99)
Sodium: 134 mEq/L — ABNORMAL LOW (ref 135–145)

## 2011-01-17 LAB — CARDIAC PANEL(CRET KIN+CKTOT+MB+TROPI)
Relative Index: INVALID (ref 0.0–2.5)
Total CK: 89 U/L (ref 7–177)

## 2011-01-18 LAB — URINALYSIS, MICROSCOPIC ONLY
Bilirubin Urine: NEGATIVE
Ketones, ur: NEGATIVE mg/dL
Leukocytes, UA: NEGATIVE
Nitrite: NEGATIVE
Protein, ur: NEGATIVE mg/dL
Urobilinogen, UA: 0.2 mg/dL (ref 0.0–1.0)

## 2011-01-18 LAB — CARDIAC PANEL(CRET KIN+CKTOT+MB+TROPI)
CK, MB: 2 ng/mL (ref 0.3–4.0)
Relative Index: INVALID (ref 0.0–2.5)
Troponin I: 0.09 ng/mL — ABNORMAL HIGH (ref 0.00–0.06)

## 2011-01-18 LAB — BASIC METABOLIC PANEL
BUN: 17 mg/dL (ref 6–23)
Calcium: 9 mg/dL (ref 8.4–10.5)
GFR calc non Af Amer: 49 mL/min — ABNORMAL LOW (ref >60)
Glucose, Bld: 125 mg/dL — ABNORMAL HIGH (ref 70–99)

## 2011-01-18 LAB — CBC
HCT: 26 % — ABNORMAL LOW (ref 36.0–46.0)
MCHC: 32.7 g/dL (ref 30.0–36.0)
MCV: 85.8 fL (ref 78.0–100.0)
Platelets: 344 10*3/uL (ref 150–400)
RDW: 16 % — ABNORMAL HIGH (ref 11.5–15.5)
WBC: 19.8 10*3/uL — ABNORMAL HIGH (ref 4.0–10.5)

## 2011-01-19 ENCOUNTER — Inpatient Hospital Stay (HOSPITAL_COMMUNITY): Payer: Medicaid Other

## 2011-01-19 LAB — BASIC METABOLIC PANEL
BUN: 16 mg/dL (ref 6–23)
Calcium: 8.7 mg/dL (ref 8.4–10.5)
Chloride: 101 mEq/L (ref 96–112)
Creatinine, Ser: 1.12 mg/dL (ref 0.4–1.2)
GFR calc Af Amer: >60 mL/min (ref >60)

## 2011-01-19 LAB — CBC
MCH: 27.3 pg (ref 26.0–34.0)
MCHC: 31.3 g/dL (ref 30.0–36.0)
MCV: 87.2 fL (ref 78.0–100.0)
Platelets: 360 10*3/uL (ref 150–400)
RBC: 3.04 MIL/uL — ABNORMAL LOW (ref 3.87–5.11)
RDW: 16.2 % — ABNORMAL HIGH (ref 11.5–15.5)

## 2011-01-19 LAB — MAGNESIUM: Magnesium: 1.8 mg/dL (ref 1.5–2.5)

## 2011-01-20 LAB — CBC
HCT: 27.4 % — ABNORMAL LOW (ref 36.0–46.0)
Hemoglobin: 8.7 g/dL — ABNORMAL LOW (ref 12.0–15.0)
MCH: 28 pg (ref 26.0–34.0)
MCHC: 31.8 g/dL (ref 30.0–36.0)
RDW: 16.3 % — ABNORMAL HIGH (ref 11.5–15.5)

## 2011-01-20 LAB — DIFFERENTIAL
Basophils Relative: 1 % (ref 0–1)
Eosinophils Relative: 1 % (ref 0–5)
Monocytes Absolute: 1.4 10*3/uL — ABNORMAL HIGH (ref 0.1–1.0)
Monocytes Relative: 8 % (ref 3–12)
Neutro Abs: 12.1 10*3/uL — ABNORMAL HIGH (ref 1.7–7.7)

## 2011-01-20 LAB — BASIC METABOLIC PANEL
CO2: 26 mEq/L (ref 19–32)
Calcium: 8.4 mg/dL (ref 8.4–10.5)
Creatinine, Ser: 0.96 mg/dL (ref 0.4–1.2)
GFR calc Af Amer: >60 mL/min (ref >60)
Glucose, Bld: 101 mg/dL — ABNORMAL HIGH (ref 70–99)

## 2011-01-20 LAB — BRAIN NATRIURETIC PEPTIDE: Pro B Natriuretic peptide (BNP): 556 pg/mL — ABNORMAL HIGH (ref 0.0–100.0)

## 2011-01-21 LAB — CBC
HCT: 28.4 % — ABNORMAL LOW (ref 36.0–46.0)
MCV: 87.7 fL (ref 78.0–100.0)
Platelets: 226 10*3/uL (ref 150–400)
RBC: 3.24 MIL/uL — ABNORMAL LOW (ref 3.87–5.11)
WBC: 13 10*3/uL — ABNORMAL HIGH (ref 4.0–10.5)

## 2011-01-21 LAB — BASIC METABOLIC PANEL
Chloride: 104 mEq/L (ref 96–112)
Creatinine, Ser: 1.04 mg/dL (ref 0.4–1.2)
GFR calc Af Amer: >60 mL/min (ref >60)
GFR calc non Af Amer: 54 mL/min — ABNORMAL LOW (ref >60)
Potassium: 4.2 mEq/L (ref 3.5–5.1)

## 2011-01-21 NOTE — Procedures (Signed)
Summary: Upper Endoscopy  Patient: [name] Note: All result statuses are Final unless otherwise noted.  Tests: (1) Upper Endoscopy (EGD)   EGD Upper Endoscopy       DONE     [facility]     [address] [place], [place]  [address]           ENDOSCOPY PROCEDURE REPORT           PATIENT:  [name], [name]  MR#:  [id number]     BIRTHDATE:  06-02-1951, 59 yrs. old  GENDER:  female     ENDOSCOPIST:  Rachael Fee, MD     PROCEDURE DATE:  01/14/2011     PROCEDURE:  EGD, diagnostic 43235     ASA CLASS:  Class II     INDICATIONS:  Gi bleeding, minor hematemsis; Hb 5.1, INR>10, BUN     very high     MEDICATIONS:  Fentanyl 100 mcg IV, Versed 7.5 mg IV     TOPICAL ANESTHETIC:  Cetacaine Spray           DESCRIPTION OF PROCEDURE:   After the risks benefits and     alternatives of the procedure were thoroughly explained, informed     consent was obtained.  The  endoscope was introduced through the     mouth and advanced to the third portion of the duodenum, without     limitations.  The instrument was slowly withdrawn as the mucosa     was fully examined.     <<PROCEDUREIMAGES>>     There was very minor non-specific gastritis. Otherwise completely     normal examination deep into duodenum (see image1, image2, image3,     image4, image5, and image6).    Retroflexed views revealed no     abnormalities.    The scope was then withdrawn from the patient     and the procedure completed.     COMPLICATIONS:  None           ENDOSCOPIC IMPRESSION:     1) Mild gastritis, otherwise normal examination.     2) No clear explanation for recent bleeding, perhaps small     Dielafoy's lesion?           RECOMMENDATIONS:     She really presented like an UGI bleed, but the EGD deep into     duodenum was quite unrevealing.  Will plan on colonoscopy     tomorrow.  Can stop PPI drip, change to once daily ppi.           ______________________________     Rachael Fee, MD        n.     eSIGNED:   Rachael Fee at 01/14/2011 09:25 AM           Manson Passey, [id number]  Note: An exclamation mark (!) indicates a result that was not dispersed into the flowsheet. Document Creation Date: 01/14/2011 9:25 AM _______________________________________________________________________  (1) Order result status: Final Collection or observation date-time: 01/14/2011 09:18 Requested date-time:  Receipt date-time:  Reported date-time:  Referring Physician:   Ordering Physician: Rob Bunting [phone]) Specimen Source:  Source: Launa Grill Order Number: [id number] Lab site:

## 2011-01-21 NOTE — Procedures (Signed)
Summary: Colonoscopy  Patient: Allisha Harter Note: All result statuses are Final unless otherwise noted.  Tests: (1) Colonoscopy (COL)   COL Colonoscopy           DONE     [facility]     [address] [place], [place]  [address]           COLONOSCOPY PROCEDURE REPORT     PATIENT:  Caley, Ciaramitaro  MR#:  [id number]     BIRTHDATE:  [birth year], 59 yrs. old  GENDER:  female     ENDOSCOPIST:  Rachael Fee, MD     PROCEDURE DATE:  01/15/2011     PROCEDURE:  Diagnostic Colonoscopy     ASA CLASS:  Class II     INDICATIONS:  GI bleeding, EGD yesterday essentially normal; EGD     04/2010 by Dr. Elnoria Howard for CGE was normal     MEDICATIONS:   Fentanyl 100 mcg IV, Versed 8 mg IV     DESCRIPTION OF PROCEDURE:   After the risks benefits and     alternatives of the procedure were thoroughly explained, informed     consent was obtained.  Digital rectal exam was performed and     revealed rectal prolapse.   The Pentax Colonoscope [id number]     endoscope was introduced through the anus and advanced to the     cecum, which was identified by both the appendix and ileocecal     valve, without limitations.  The quality of the prep was adequate,     using MoviPrep.  The instrument was then slowly withdrawn as the     colon was fully examined.     <<PROCEDUREIMAGES>>     FINDINGS:  There was a 2cm rectal prolapse that I reduced at     begining of procedure. There were also clear external and internal     hemorrhoids. Internal hemorrhoids appeared thrombosed vs. anal     mass vs. inflammation from rectal prolasping. There was     intermittent oozing at this internal site (see image1 and image4).     This was otherwise a normal examination of the colon (see image2     and image3).   Retroflexed views in the rectum revealed no     abnormalities.    The scope was then withdrawn from the patient     and the procedure completed.     COMPLICATIONS:  None           ENDOSCOPIC IMPRESSION:    1) Internal and external hemorrhoids, large and     thrombosed/inflammed     2) Short rectal prolapse that was reduced (this may simply have     been very large hemorrhoids but seemed more like short segment of     prolapse.  Internally, appeared that she has thrombosed hemorrhoid     but anal mass is a possibility.  This ano-rectal process is     probably cause of her recent bleeding, INR >10 obviously     contributed to the amount of blood loss.     2) Otherwise normal examination           RECOMMENDATIONS:     She needs to be on twice daily anal care (suppositories), needs     to be on miralax to keep stools soft.     Should consult general surgery to evaluate rectal prolapse as     well as get opinion  on possible anal mass.     Would hold coumadin for another 7 days.           ______________________________     Rachael Fee, MD           n.     eSIGNED:   Rachael Fee at 01/15/2011 10:00 AM           Manson Passey, [id number]  Note: An exclamation mark (!) indicates a result that was not dispersed into the flowsheet. Document Creation Date: 01/15/2011 10:01 AM _______________________________________________________________________  (1) Order result status: Final Collection or observation date-time: 01/15/2011 09:46 Requested date-time:  Receipt date-time:  Reported date-time:  Referring Physician:   Ordering Physician: Rob Bunting [phone]) Specimen Source:  Source: Launa Grill Order Number: [id number] Lab site:

## 2011-01-22 LAB — CARDIAC PANEL(CRET KIN+CKTOT+MB+TROPI)
CK, MB: 1.7 ng/mL (ref 0.3–4.0)
Total CK: 51 U/L (ref 7–177)
Troponin I: 0.03 ng/mL (ref 0.00–0.06)

## 2011-01-22 LAB — CBC
HCT: 26.8 % — ABNORMAL LOW (ref 36.0–46.0)
Hemoglobin: 8.2 g/dL — ABNORMAL LOW (ref 12.0–15.0)
MCHC: 30.6 g/dL (ref 30.0–36.0)
RBC: 3.07 MIL/uL — ABNORMAL LOW (ref 3.87–5.11)

## 2011-01-22 LAB — BRAIN NATRIURETIC PEPTIDE: Pro B Natriuretic peptide (BNP): 587 pg/mL — ABNORMAL HIGH (ref 0.0–100.0)

## 2011-01-23 LAB — CBC
HCT: 24.8 % — ABNORMAL LOW (ref 36.0–46.0)
MCHC: 31.5 g/dL (ref 30.0–36.0)
Platelets: 437 10*3/uL — ABNORMAL HIGH (ref 150–400)
RDW: 16.5 % — ABNORMAL HIGH (ref 11.5–15.5)
WBC: 12.5 10*3/uL — ABNORMAL HIGH (ref 4.0–10.5)

## 2011-01-23 LAB — BASIC METABOLIC PANEL
Calcium: 9 mg/dL (ref 8.4–10.5)
GFR calc Af Amer: 56 mL/min — ABNORMAL LOW (ref >60)
GFR calc non Af Amer: 46 mL/min — ABNORMAL LOW (ref >60)
Glucose, Bld: 113 mg/dL — ABNORMAL HIGH (ref 70–99)
Potassium: 4.2 mEq/L (ref 3.5–5.1)
Sodium: 140 mEq/L (ref 135–145)

## 2011-01-23 LAB — CARDIAC PANEL(CRET KIN+CKTOT+MB+TROPI)
CK, MB: 1.7 ng/mL (ref 0.3–4.0)
Relative Index: INVALID (ref 0.0–2.5)
Total CK: 49 U/L (ref 7–177)
Total CK: 54 U/L (ref 7–177)
Troponin I: 0.03 ng/mL (ref 0.00–0.06)

## 2011-01-24 LAB — CBC
HCT: 25.7 % — ABNORMAL LOW (ref 36.0–46.0)
Hemoglobin: 8 g/dL — ABNORMAL LOW (ref 12.0–15.0)
MCH: 26.6 pg (ref 26.0–34.0)
MCHC: 31.1 g/dL (ref 30.0–36.0)
RBC: 3.01 MIL/uL — ABNORMAL LOW (ref 3.87–5.11)

## 2011-01-24 LAB — BASIC METABOLIC PANEL
CO2: 27 mEq/L (ref 19–32)
Calcium: 9.3 mg/dL (ref 8.4–10.5)
Chloride: 105 mEq/L (ref 96–112)
Creatinine, Ser: 1.08 mg/dL (ref 0.4–1.2)
GFR calc Af Amer: >60 mL/min (ref >60)
Glucose, Bld: 108 mg/dL — ABNORMAL HIGH (ref 70–99)

## 2011-02-07 ENCOUNTER — Emergency Department (HOSPITAL_COMMUNITY): Payer: Medicaid Other

## 2011-02-07 ENCOUNTER — Inpatient Hospital Stay (HOSPITAL_COMMUNITY)
Admission: EM | Admit: 2011-02-07 | Discharge: 2011-02-13 | DRG: 392 | Disposition: A | Discharge status: Home / Self Care | Payer: Medicaid Other | Attending: Internal Medicine | Admitting: Internal Medicine

## 2011-02-07 DIAGNOSIS — I5032 Chronic diastolic (congestive) heart failure: Secondary | ICD-10-CM | POA: Diagnosis present

## 2011-02-07 DIAGNOSIS — R066 Hiccough: Secondary | ICD-10-CM | POA: Diagnosis present

## 2011-02-07 DIAGNOSIS — R4182 Altered mental status, unspecified: Secondary | ICD-10-CM | POA: Diagnosis present

## 2011-02-07 DIAGNOSIS — N179 Acute kidney failure, unspecified: Secondary | ICD-10-CM | POA: Diagnosis present

## 2011-02-07 DIAGNOSIS — E785 Hyperlipidemia, unspecified: Secondary | ICD-10-CM | POA: Diagnosis present

## 2011-02-07 DIAGNOSIS — D649 Anemia, unspecified: Secondary | ICD-10-CM | POA: Diagnosis present

## 2011-02-07 DIAGNOSIS — A088 Other specified intestinal infections: Principal | ICD-10-CM | POA: Diagnosis present

## 2011-02-07 DIAGNOSIS — I4892 Unspecified atrial flutter: Secondary | ICD-10-CM | POA: Diagnosis present

## 2011-02-07 DIAGNOSIS — T433X5A Adverse effect of phenothiazine antipsychotics and neuroleptics, initial encounter: Secondary | ICD-10-CM | POA: Diagnosis present

## 2011-02-07 DIAGNOSIS — I4891 Unspecified atrial fibrillation: Secondary | ICD-10-CM | POA: Diagnosis present

## 2011-02-07 DIAGNOSIS — E876 Hypokalemia: Secondary | ICD-10-CM | POA: Diagnosis present

## 2011-02-07 DIAGNOSIS — I509 Heart failure, unspecified: Secondary | ICD-10-CM | POA: Diagnosis present

## 2011-02-07 LAB — BLOOD GAS, ARTERIAL
Bicarbonate: 21 mEq/L (ref 20.0–24.0)
FIO2: 0.21 %
Patient temperature: 98.6
pCO2 arterial: 27.1 mmHg — ABNORMAL LOW (ref 35.0–45.0)
pH, Arterial: 7.501 — ABNORMAL HIGH (ref 7.350–7.400)

## 2011-02-07 LAB — POCT I-STAT, CHEM 8
Calcium, Ion: 1.14 mmol/L (ref 1.12–1.32)
Chloride: 109 mEq/L (ref 96–112)
Creatinine, Ser: 1.7 mg/dL — ABNORMAL HIGH (ref 0.4–1.2)
Glucose, Bld: 142 mg/dL — ABNORMAL HIGH (ref 70–99)
Hemoglobin: 10.5 g/dL — ABNORMAL LOW (ref 12.0–15.0)
Potassium: 3.3 mEq/L — ABNORMAL LOW (ref 3.5–5.1)

## 2011-02-07 LAB — CARDIAC PANEL(CRET KIN+CKTOT+MB+TROPI)
CK, MB: 1.8 ng/mL (ref 0.3–4.0)
Total CK: 67 U/L (ref 7–177)

## 2011-02-07 LAB — URINALYSIS, ROUTINE W REFLEX MICROSCOPIC
Bilirubin Urine: NEGATIVE
Bilirubin Urine: NEGATIVE
Hgb urine dipstick: NEGATIVE
Hgb urine dipstick: NEGATIVE
Ketones, ur: NEGATIVE mg/dL
Protein, ur: NEGATIVE mg/dL
Protein, ur: 30 mg/dL — AB
Urobilinogen, UA: 0.2 mg/dL (ref 0.0–1.0)
Urobilinogen, UA: 0.2 mg/dL (ref 0.0–1.0)

## 2011-02-07 LAB — MAGNESIUM: Magnesium: 1.9 mg/dL (ref 1.5–2.5)

## 2011-02-07 LAB — COMPREHENSIVE METABOLIC PANEL
Albumin: 3.8 g/dL (ref 3.5–5.2)
BUN: 14 mg/dL (ref 6–23)
Calcium: 9.9 mg/dL (ref 8.4–10.5)
Chloride: 103 mEq/L (ref 96–112)
Creatinine, Ser: 1.33 mg/dL — ABNORMAL HIGH (ref 0.4–1.2)
Total Bilirubin: 0.7 mg/dL (ref 0.3–1.2)
Total Protein: 8.5 g/dL — ABNORMAL HIGH (ref 6.0–8.3)

## 2011-02-07 LAB — DIFFERENTIAL
Eosinophils Absolute: 0.1 10*3/uL (ref 0.0–0.7)
Eosinophils Relative: 1 % (ref 0–5)
Lymphocytes Relative: 12 % (ref 12–46)
Lymphs Abs: 1.5 10*3/uL (ref 0.7–4.0)
Monocytes Absolute: 0.6 10*3/uL (ref 0.1–1.0)

## 2011-02-07 LAB — POCT CARDIAC MARKERS
CKMB, poc: 1.2 ng/mL (ref 1.0–8.0)
Myoglobin, poc: 69.1 ng/mL (ref 12–200)
Troponin i, poc: <0.05 ng/mL (ref 0.00–0.09)

## 2011-02-07 LAB — GLUCOSE, CAPILLARY
Glucose-Capillary: 150 mg/dL — ABNORMAL HIGH (ref 70–99)
Glucose-Capillary: 148 mg/dL — ABNORMAL HIGH (ref 70–99)

## 2011-02-07 LAB — CBC
HCT: 30.5 % — ABNORMAL LOW (ref 36.0–46.0)
MCHC: 30.2 g/dL (ref 30.0–36.0)
MCV: 82.7 fL (ref 78.0–100.0)
Platelets: 537 10*3/uL — ABNORMAL HIGH (ref 150–400)
RDW: 16.2 % — ABNORMAL HIGH (ref 11.5–15.5)

## 2011-02-07 LAB — URINE MICROSCOPIC-ADD ON

## 2011-02-07 LAB — CK TOTAL AND CKMB (NOT AT ARMC): CK, MB: 1.6 ng/mL (ref 0.3–4.0)

## 2011-02-08 LAB — COMPREHENSIVE METABOLIC PANEL
Albumin: 3.6 g/dL (ref 3.5–5.2)
BUN: 11 mg/dL (ref 6–23)
Creatinine, Ser: 0.85 mg/dL (ref 0.4–1.2)
Glucose, Bld: 165 mg/dL — ABNORMAL HIGH (ref 70–99)
Total Bilirubin: 0.8 mg/dL (ref 0.3–1.2)
Total Protein: 7.7 g/dL (ref 6.0–8.3)

## 2011-02-08 LAB — CARDIAC PANEL(CRET KIN+CKTOT+MB+TROPI)
CK, MB: 2.8 ng/mL (ref 0.3–4.0)
Total CK: 59 U/L (ref 7–177)
Troponin I: 0.03 ng/mL (ref 0.00–0.06)

## 2011-02-08 LAB — URINE CULTURE: Culture  Setup Time: 201203160855

## 2011-02-08 LAB — CBC
Hemoglobin: 9.4 g/dL — ABNORMAL LOW (ref 12.0–15.0)
RBC: 3.73 MIL/uL — ABNORMAL LOW (ref 3.87–5.11)
WBC: 14.4 10*3/uL — ABNORMAL HIGH (ref 4.0–10.5)

## 2011-02-08 LAB — BRAIN NATRIURETIC PEPTIDE: Pro B Natriuretic peptide (BNP): 341 pg/mL — ABNORMAL HIGH (ref 0.0–100.0)

## 2011-02-09 ENCOUNTER — Inpatient Hospital Stay (HOSPITAL_COMMUNITY): Payer: Medicaid Other

## 2011-02-09 LAB — LIPID PANEL
Cholesterol: 158 mg/dL (ref 0–200)
Triglycerides: 87 mg/dL (ref <150)

## 2011-02-09 LAB — BASIC METABOLIC PANEL
BUN: 7 mg/dL (ref 6–23)
BUN: 10 mg/dL (ref 6–23)
BUN: 12 mg/dL (ref 6–23)
BUN: 12 mg/dL (ref 6–23)
BUN: 5 mg/dL — ABNORMAL LOW (ref 6–23)
BUN: 9 mg/dL (ref 6–23)
CO2: 22 mEq/L (ref 19–32)
CO2: 23 mEq/L (ref 19–32)
CO2: 24 mEq/L (ref 19–32)
CO2: 24 mEq/L (ref 19–32)
CO2: 25 mEq/L (ref 19–32)
CO2: 26 mEq/L (ref 19–32)
CO2: 27 mEq/L (ref 19–32)
Calcium: 9.6 mg/dL (ref 8.4–10.5)
Calcium: 9 mg/dL (ref 8.4–10.5)
Chloride: 101 mEq/L (ref 96–112)
Chloride: 102 mEq/L (ref 96–112)
Chloride: 103 mEq/L (ref 96–112)
Chloride: 107 mEq/L (ref 96–112)
Creatinine, Ser: 0.81 mg/dL (ref 0.4–1.2)
Creatinine, Ser: 0.76 mg/dL (ref 0.4–1.2)
Creatinine, Ser: 0.93 mg/dL (ref 0.4–1.2)
GFR calc Af Amer: >60 mL/min (ref >60)
GFR calc Af Amer: >60 mL/min (ref >60)
GFR calc non Af Amer: >60 mL/min (ref >60)
GFR calc non Af Amer: 49 mL/min — ABNORMAL LOW (ref >60)
GFR calc non Af Amer: 57 mL/min — ABNORMAL LOW (ref >60)
GFR calc non Af Amer: >60 mL/min (ref >60)
GFR calc non Af Amer: >60 mL/min (ref >60)
Glucose, Bld: 131 mg/dL — ABNORMAL HIGH (ref 70–99)
Glucose, Bld: 112 mg/dL — ABNORMAL HIGH (ref 70–99)
Glucose, Bld: 116 mg/dL — ABNORMAL HIGH (ref 70–99)
Glucose, Bld: 117 mg/dL — ABNORMAL HIGH (ref 70–99)
Glucose, Bld: 124 mg/dL — ABNORMAL HIGH (ref 70–99)
Glucose, Bld: 127 mg/dL — ABNORMAL HIGH (ref 70–99)
Glucose, Bld: 90 mg/dL (ref 70–99)
Potassium: 2.9 mEq/L — ABNORMAL LOW (ref 3.5–5.1)
Potassium: 3.1 mEq/L — ABNORMAL LOW (ref 3.5–5.1)
Potassium: 3.4 mEq/L — ABNORMAL LOW (ref 3.5–5.1)
Potassium: 3.5 mEq/L (ref 3.5–5.1)
Potassium: 3.5 mEq/L (ref 3.5–5.1)
Sodium: 137 mEq/L (ref 135–145)
Sodium: 137 mEq/L (ref 135–145)
Sodium: 137 mEq/L (ref 135–145)
Sodium: 138 mEq/L (ref 135–145)
Sodium: 139 mEq/L (ref 135–145)

## 2011-02-09 LAB — URINALYSIS, MICROSCOPIC ONLY
Bilirubin Urine: NEGATIVE
Glucose, UA: NEGATIVE mg/dL
Hgb urine dipstick: NEGATIVE
Ketones, ur: NEGATIVE mg/dL
Leukocytes, UA: NEGATIVE
Nitrite: NEGATIVE
Protein, ur: NEGATIVE mg/dL
Specific Gravity, Urine: 1.01 (ref 1.005–1.030)
Urobilinogen, UA: 0.2 mg/dL (ref 0.0–1.0)
pH: 5.5 (ref 5.0–8.0)

## 2011-02-09 LAB — CROSSMATCH
ABO/RH(D): O POS
Antibody Screen: NEGATIVE

## 2011-02-09 LAB — PREPARE FRESH FROZEN PLASMA

## 2011-02-09 LAB — CARDIAC PANEL(CRET KIN+CKTOT+MB+TROPI)
CK, MB: 1.2 ng/mL (ref 0.3–4.0)
CK, MB: 1.9 ng/mL (ref 0.3–4.0)
CK, MB: 2 ng/mL (ref 0.3–4.0)
CK, MB: 3 ng/mL (ref 0.3–4.0)
Relative Index: 1.3 (ref 0.0–2.5)
Relative Index: INVALID (ref 0.0–2.5)
Relative Index: INVALID (ref 0.0–2.5)
Total CK: 234 U/L — ABNORMAL HIGH (ref 7–177)
Total CK: 49 U/L (ref 7–177)
Total CK: 55 U/L (ref 7–177)

## 2011-02-09 LAB — URINALYSIS, ROUTINE W REFLEX MICROSCOPIC
Ketones, ur: NEGATIVE mg/dL
Leukocytes, UA: NEGATIVE
Protein, ur: 100 mg/dL — AB
Urobilinogen, UA: 0.2 mg/dL (ref 0.0–1.0)

## 2011-02-09 LAB — PROTIME-INR
INR: 1.28 (ref 0.00–1.49)
INR: 1.59 — ABNORMAL HIGH (ref 0.00–1.49)
INR: 2.29 — ABNORMAL HIGH (ref 0.00–1.49)
INR: 1.13 (ref 0.00–1.49)
INR: 1.47 (ref 0.00–1.49)
INR: 2 — ABNORMAL HIGH (ref 0.00–1.49)
Prothrombin Time: 18.8 seconds — ABNORMAL HIGH (ref 11.6–15.2)
Prothrombin Time: 25 seconds — ABNORMAL HIGH (ref 11.6–15.2)
Prothrombin Time: 28.8 seconds — ABNORMAL HIGH (ref 11.6–15.2)
Prothrombin Time: 14.4 s (ref 11.6–15.2)
Prothrombin Time: 17.7 s — ABNORMAL HIGH (ref 11.6–15.2)

## 2011-02-09 LAB — CBC
HCT: 31 % — ABNORMAL LOW (ref 36.0–46.0)
HCT: 33 % — ABNORMAL LOW (ref 36.0–46.0)
HCT: 35.9 % — ABNORMAL LOW (ref 36.0–46.0)
HCT: 42.1 % (ref 36.0–46.0)
HCT: 44.1 % (ref 36.0–46.0)
HCT: 46 % (ref 36.0–46.0)
HCT: 47.5 % — ABNORMAL HIGH (ref 36.0–46.0)
HCT: 48 % — ABNORMAL HIGH (ref 36.0–46.0)
HCT: 49.4 % — ABNORMAL HIGH (ref 36.0–46.0)
Hemoglobin: 10.7 g/dL — ABNORMAL LOW (ref 12.0–15.0)
Hemoglobin: 11.1 g/dL — ABNORMAL LOW (ref 12.0–15.0)
Hemoglobin: 12.1 g/dL (ref 12.0–15.0)
Hemoglobin: 14.4 g/dL (ref 12.0–15.0)
Hemoglobin: 15.7 g/dL — ABNORMAL HIGH (ref 12.0–15.0)
Hemoglobin: 15.7 g/dL — ABNORMAL HIGH (ref 12.0–15.0)
Hemoglobin: 16.5 g/dL — ABNORMAL HIGH (ref 12.0–15.0)
MCH: 25.4 pg — ABNORMAL LOW (ref 26.0–34.0)
MCH: 28.5 pg (ref 26.0–34.0)
MCH: 28.1 pg (ref 26.0–34.0)
MCH: 28.3 pg (ref 26.0–34.0)
MCH: 28.4 pg (ref 26.0–34.0)
MCH: 28.7 pg (ref 26.0–34.0)
MCH: 28.8 pg (ref 26.0–34.0)
MCH: 29.2 pg (ref 26.0–34.0)
MCHC: 31.3 g/dL (ref 30.0–36.0)
MCHC: 32.8 g/dL (ref 30.0–36.0)
MCHC: 33 g/dL (ref 30.0–36.0)
MCHC: 33.4 g/dL (ref 30.0–36.0)
MCHC: 33.4 g/dL (ref 30.0–36.0)
MCHC: 33.6 g/dL (ref 30.0–36.0)
MCHC: 34 g/dL (ref 30.0–36.0)
MCV: 85.8 fL (ref 78.0–100.0)
MCV: 85 fL (ref 78.0–100.0)
MCV: 85.3 fL (ref 78.0–100.0)
MCV: 85.7 fL (ref 78.0–100.0)
MCV: 85.7 fL (ref 78.0–100.0)
MCV: 85.7 fL (ref 78.0–100.0)
Platelets: 212 10*3/uL (ref 150–400)
Platelets: 233 10*3/uL (ref 150–400)
Platelets: 168 10*3/uL (ref 150–400)
Platelets: 213 K/uL (ref 150–400)
Platelets: 231 K/uL (ref 150–400)
RBC: 3.73 MIL/uL — ABNORMAL LOW (ref 3.87–5.11)
RBC: 3.76 MIL/uL — ABNORMAL LOW (ref 3.87–5.11)
RBC: 3.85 MIL/uL — ABNORMAL LOW (ref 3.87–5.11)
RBC: 4.96 MIL/uL (ref 3.87–5.11)
RBC: 5.15 MIL/uL — ABNORMAL HIGH (ref 3.87–5.11)
RBC: 5.37 MIL/uL — ABNORMAL HIGH (ref 3.87–5.11)
RBC: 5.53 MIL/uL — ABNORMAL HIGH (ref 3.87–5.11)
RBC: 5.63 MIL/uL — ABNORMAL HIGH (ref 3.87–5.11)
RDW: 15.9 % — ABNORMAL HIGH (ref 11.5–15.5)
RDW: 14.3 % (ref 11.5–15.5)
RDW: 14.6 % (ref 11.5–15.5)
RDW: 14.7 % (ref 11.5–15.5)
RDW: 14.9 % (ref 11.5–15.5)
RDW: 15 % (ref 11.5–15.5)
WBC: 10.9 10*3/uL — ABNORMAL HIGH (ref 4.0–10.5)
WBC: 11.1 10*3/uL — ABNORMAL HIGH (ref 4.0–10.5)
WBC: 12.9 10*3/uL — ABNORMAL HIGH (ref 4.0–10.5)
WBC: 13.4 10*3/uL — ABNORMAL HIGH (ref 4.0–10.5)
WBC: 16.6 K/uL — ABNORMAL HIGH (ref 4.0–10.5)
WBC: 17.4 K/uL — ABNORMAL HIGH (ref 4.0–10.5)
WBC: 19.4 10*3/uL — ABNORMAL HIGH (ref 4.0–10.5)
WBC: 20.6 10*3/uL — ABNORMAL HIGH (ref 4.0–10.5)

## 2011-02-09 LAB — BASIC METABOLIC PANEL WITH GFR
BUN: 8 mg/dL (ref 6–23)
CO2: 23 meq/L (ref 19–32)
Calcium: 9 mg/dL (ref 8.4–10.5)
Chloride: 100 meq/L (ref 96–112)
Creatinine, Ser: 0.9 mg/dL (ref 0.4–1.2)
GFR calc non Af Amer: >60 mL/min
Glucose, Bld: 123 mg/dL — ABNORMAL HIGH (ref 70–99)
Potassium: 3.5 meq/L (ref 3.5–5.1)
Sodium: 134 meq/L — ABNORMAL LOW (ref 135–145)

## 2011-02-09 LAB — DIFFERENTIAL
Basophils Absolute: 0 10*3/uL (ref 0.0–0.1)
Basophils Absolute: 0.1 10*3/uL (ref 0.0–0.1)
Basophils Absolute: 0.1 K/uL (ref 0.0–0.1)
Basophils Relative: 0 % (ref 0–1)
Basophils Relative: 0 % (ref 0–1)
Basophils Relative: 1 % (ref 0–1)
Basophils Relative: 1 % (ref 0–1)
Eosinophils Absolute: 0 10*3/uL (ref 0.0–0.7)
Eosinophils Absolute: 0 10*3/uL (ref 0.0–0.7)
Eosinophils Absolute: 0.1 K/uL (ref 0.0–0.7)
Eosinophils Relative: 0 % (ref 0–5)
Eosinophils Relative: 0 % (ref 0–5)
Eosinophils Relative: 0 % (ref 0–5)
Eosinophils Relative: 1 % (ref 0–5)
Lymphocytes Relative: 11 % — ABNORMAL LOW (ref 12–46)
Lymphocytes Relative: 12 % (ref 12–46)
Lymphocytes Relative: 14 % (ref 12–46)
Lymphocytes Relative: 15 % (ref 12–46)
Lymphs Abs: 1.6 K/uL (ref 0.7–4.0)
Lymphs Abs: 2.3 10*3/uL (ref 0.7–4.0)
Lymphs Abs: 2.7 10*3/uL (ref 0.7–4.0)
Lymphs Abs: 2.8 10*3/uL (ref 0.7–4.0)
Monocytes Absolute: 0.8 K/uL (ref 0.1–1.0)
Monocytes Absolute: 0.9 10*3/uL (ref 0.1–1.0)
Monocytes Absolute: 0.9 10*3/uL (ref 0.1–1.0)
Monocytes Absolute: 0.9 10*3/uL (ref 0.1–1.0)
Monocytes Absolute: 1.4 10*3/uL — ABNORMAL HIGH (ref 0.1–1.0)
Monocytes Relative: 5 % (ref 3–12)
Monocytes Relative: 6 % (ref 3–12)
Monocytes Relative: 6 % (ref 3–12)
Monocytes Relative: 7 % (ref 3–12)
Neutro Abs: 10.9 K/uL — ABNORMAL HIGH (ref 1.7–7.7)
Neutro Abs: 13.1 10*3/uL — ABNORMAL HIGH (ref 1.7–7.7)
Neutro Abs: 17 10*3/uL — ABNORMAL HIGH (ref 1.7–7.7)
Neutrophils Relative %: 78 % — ABNORMAL HIGH (ref 43–77)
Neutrophils Relative %: 81 % — ABNORMAL HIGH (ref 43–77)

## 2011-02-09 LAB — URINE CULTURE: Culture  Setup Time: 201203170036

## 2011-02-09 LAB — TSH: TSH: 0.581 u[IU]/mL (ref 0.350–4.500)

## 2011-02-09 LAB — POCT I-STAT, CHEM 8
BUN: 11 mg/dL (ref 6–23)
Chloride: 105 mEq/L (ref 96–112)
Creatinine, Ser: 1 mg/dL (ref 0.4–1.2)
Sodium: 136 mEq/L (ref 135–145)

## 2011-02-09 LAB — CULTURE, ROUTINE-ABSCESS: Culture: NO GROWTH

## 2011-02-09 LAB — CK TOTAL AND CKMB (NOT AT ARMC)
Total CK: 555 U/L — ABNORMAL HIGH (ref 7–177)
Total CK: 566 U/L — ABNORMAL HIGH (ref 7–177)

## 2011-02-09 LAB — URINE MICROSCOPIC-ADD ON

## 2011-02-09 LAB — POCT CARDIAC MARKERS
CKMB, poc: 10.8 ng/mL (ref 1.0–8.0)
Myoglobin, poc: >500 ng/mL (ref 12–200)
Troponin i, poc: <0.05 ng/mL (ref 0.00–0.09)

## 2011-02-09 LAB — MAGNESIUM
Magnesium: 1.7 mg/dL (ref 1.5–2.5)
Magnesium: 1.8 mg/dL (ref 1.5–2.5)

## 2011-02-09 LAB — CULTURE, BLOOD (ROUTINE X 2)
Culture: NO GROWTH
Culture: NO GROWTH

## 2011-02-09 LAB — T4, FREE: Free T4: 1.51 ng/dL (ref 0.80–1.80)

## 2011-02-09 LAB — RAPID URINE DRUG SCREEN, HOSP PERFORMED
Amphetamines: NOT DETECTED
Barbiturates: NOT DETECTED
Benzodiazepines: NOT DETECTED
Cocaine: NOT DETECTED
Opiates: NOT DETECTED
Tetrahydrocannabinol: NOT DETECTED

## 2011-02-09 LAB — TROPONIN I: Troponin I: 0.03 ng/mL (ref 0.00–0.06)

## 2011-02-09 LAB — LACTIC ACID, PLASMA: Lactic Acid, Venous: 2 mmol/L (ref 0.5–2.2)

## 2011-02-09 LAB — HEMOGLOBIN AND HEMATOCRIT, BLOOD: HCT: 34.1 % — ABNORMAL LOW (ref 36.0–46.0)

## 2011-02-09 LAB — ABO/RH: ABO/RH(D): O POS

## 2011-02-09 LAB — HEMOCCULT GUIAC POC 1CARD (OFFICE): Fecal Occult Bld: NEGATIVE

## 2011-02-09 MED ORDER — IOHEXOL 300 MG/ML  SOLN
125.0000 mL | Freq: Once | INTRAMUSCULAR | Status: AC | PRN
  Administered 2011-02-09: 125 mL via INTRAVENOUS

## 2011-02-10 ENCOUNTER — Inpatient Hospital Stay (HOSPITAL_COMMUNITY): Payer: Medicaid Other

## 2011-02-10 LAB — COMPREHENSIVE METABOLIC PANEL
AST: 18 U/L (ref 0–37)
Albumin: 2.9 g/dL — ABNORMAL LOW (ref 3.5–5.2)
Alkaline Phosphatase: 58 U/L (ref 39–117)
Chloride: 108 mEq/L (ref 96–112)
Creatinine, Ser: 2.03 mg/dL — ABNORMAL HIGH (ref 0.4–1.2)
GFR calc Af Amer: 30 mL/min — ABNORMAL LOW (ref >60)
Potassium: 3.4 mEq/L — ABNORMAL LOW (ref 3.5–5.1)
Total Bilirubin: 0.3 mg/dL (ref 0.3–1.2)
Total Protein: 6.2 g/dL (ref 6.0–8.3)

## 2011-02-10 LAB — CBC
Platelets: 533 10*3/uL — ABNORMAL HIGH (ref 150–400)
RBC: 3.48 MIL/uL — ABNORMAL LOW (ref 3.87–5.11)
WBC: 13.1 10*3/uL — ABNORMAL HIGH (ref 4.0–10.5)

## 2011-02-10 LAB — MAGNESIUM: Magnesium: 1.9 mg/dL (ref 1.5–2.5)

## 2011-02-10 LAB — BASIC METABOLIC PANEL
Chloride: 106 mEq/L (ref 96–112)
GFR calc Af Amer: 29 mL/min — ABNORMAL LOW (ref >60)
Potassium: 3.4 mEq/L — ABNORMAL LOW (ref 3.5–5.1)

## 2011-02-11 LAB — CBC
Hemoglobin: 8 g/dL — ABNORMAL LOW (ref 12.0–15.0)
MCH: 25.1 pg — ABNORMAL LOW (ref 26.0–34.0)
MCHC: 30.9 g/dL (ref 30.0–36.0)
MCV: 81.2 fL (ref 78.0–100.0)

## 2011-02-11 LAB — BASIC METABOLIC PANEL
BUN: 29 mg/dL — ABNORMAL HIGH (ref 6–23)
CO2: 22 mEq/L (ref 19–32)
Calcium: 9 mg/dL (ref 8.4–10.5)
Creatinine, Ser: 1.8 mg/dL — ABNORMAL HIGH (ref 0.4–1.2)
GFR calc Af Amer: 35 mL/min — ABNORMAL LOW (ref >60)

## 2011-02-11 LAB — HEMOGLOBIN AND HEMATOCRIT, BLOOD: HCT: 26.3 % — ABNORMAL LOW (ref 36.0–46.0)

## 2011-02-11 NOTE — Progress Notes (Signed)
[name], [name] NO.:  [id number]  MEDICAL RECORD NO.:  [id number]           PATIENT TYPE:  I  LOCATION:  1231                         FACILITY:  [facility]  PHYSICIAN:  Hillery Aldo, M.D.   DATE OF BIRTH:  1951-02-17                                PROGRESS NOTE   PRIMARY CARE PHYSICIAN: Dineen Kid. Reche Dixon, MD.  CURRENT DIAGNOSES: 1. Gastroenteritis with intractable nausea and vomiting. 2. Acute renal failure secondary to dehydration, contrast nephropathy,     and ACE inhibitor use. 3. Leukocytosis, resolved. 4. Acute-on-chronic normocytic anemia with a history of     gastrointestinal bleeding. 5. Hypokalemia. 6. Hypertension. 7. Atrial fibrillation/flutter 8. Tobacco abuse. 9. Chronic diastolic congestive heart failure. 10.Hyperlipidemia. 11.History of schizophrenia. 12.Thrombocytosis. 13.Transient altered mental status secondary to Thorazine     administration for hiccups. 14.Hiccups.  DISCHARGE MEDICATIONS: Will be dictated at the time of actual discharge.  CONSULTATIONS: None.  BRIEF ADMISSION HISTORY OF PRESENT ILLNESS: [name] is a 60 year old female who presented to the hospital with a chief complaint of intractable nausea and vomiting.  The patient was also found to have mild leukocytosis and dehydration on presentation and subsequently was referred to the hospitalist service for inpatient evaluation and treatment.  For the full details, please see my dictated history and physical on the chart.  PROCEDURES AND DIAGNOSTIC STUDIES: 1. Acute abdominal series on February 07, 2011, showed no acute disease.     Cardiomegaly.  Nonspecific and nonobstructive bowel gas pattern     with no free abdominal air. 2. CT scan of the head on February 07, 2011, was normal appearance for     age. 3. CT scan of the chest on February 09, 2011, showed mild mediastinal     adenopathy with consideration for neoplasm or opportunistic     infection in the  differential.  There are nonspecific increased     interstitial markings in the lungs. 4. CT scan of the abdomen and pelvis on February 09, 2011, showed high-     density material in the gallbladder with the differential including     sludge, milk of calcium bile, and recent IV contrast     administration. 5. Abdominal ultrasound on February 10, 2011, was negative.  DISCHARGE LABORATORY VALUES: Will be dictated at  the time of actual discharge.  HOSPITAL COURSE BY PROBLEM: 1. Viral gastroenteritis:  The patient did present with intractable     nausea and vomiting with pyelonephritis, colitis, small bowel     obstruction, and pancreatitis all in the differential.  These were     effectively ruled out early in her hospital course.  Because of     concerns for a possible cholecystitis, CT scanning of the abdomen     and pelvis as well as abdominal ultrasound was performed, which was     also negative.  At this point, the patient's symptoms have resolved     with bowel rest and her diet has successfully been advanced and she     is tolerating solid foods without any reoccurrence of nausea or     vomiting. 2. Acute renal failure:  The patient initially presented with acute     renal failure secondary to dehydration.  Her ACE inhibitor was held     and she was hydrated with resolution of the renal failure.  Because     of hypertension, once her renal function was back to baseline, the     ACE inhibitor was resumed.  She received IV contrast for diagnostic     studies as noted above and her renal function deteriorated.  At     this time, she is being hydrated and the ACE inhibitor is again on     hold.  Her current renal failure is likely from contrast     nephropathy in the setting of ACE inhibitor use.  We expect full     resolution of her kidney function with hydration and we will     continue to monitor this closely. 3. Leukocytosis, likely due to viral gastroenteritis.  The patient's      white count has normalized. 4. Normocytic anemia:  The patient does have a history of recent GI     bleeding and her hemoglobin, which had been stable over the course     of her hospital stay, decreased by 1.7 g over the past 24 hours and     she does report some rectal bleeding that she thinks is     hemorrhoidal.  We will do serial hemoglobin and hematocrit checks     to ensure stability of her hemoglobin and treat her hemorrhoids     with Anusol-HC suppositories.  She is not on any blood thinning     medications or aspirin at this time. 5. Hypokalemia:  The patient's potassium is fully repleted in her IV     fluids. 6. Hypertension.  The patient's blood pressure is currently     controlled. 7. Atrial fibrillation/flutter:  The patient is not a candidate for     anticoagulation given her recent GI bleeding.  She originally had     rate control issues and was put on an IV Cardizem drip.  When she     was able to tolerate p.o.'s, her beta blocker was resumed.  She is     currently rate controlled and in normal sinus rhythm.  We are also     resuming amiodarone. 8. Chronic diastolic congestive heart failure:  The patient has     remained well compensated throughout her hospital stay. 9. Hyperlipidemia:  The patient's medications are currently on hold     due to intractable nausea and vomiting.  We are slowly     reintroducing her medications as she feels that some of her home     medicines were contributing to her nausea and vomiting. 10.History of schizophrenia:  The patient's mood and affect have been     stable. 11.Thrombocytosis:  Felt to be reactive.  We will continue to monitor     her platelet count closely. 12.Altered mental status:  Secondary to Thorazine administration for     hiccupping.  CT scan of her head was obtained and was negative and     a blood gas did not reveal any evidence of hypercarbia or hypoxia.     Her mental status is currently back to  baseline.  DISPOSITION: The patient will be monitored for further reduction in her creatinine and for stability of her hemoglobin and hematocrit.  She can safely be discharged home when her creatinine continues to trend down and her hemoglobin and hematocrit  prove stable.  A discharge summary addendum will be dictated at the time of actual discharge.     Hillery Aldo, M.D.     CR/MEDQ  D:  02/11/2011  T:  02/11/2011  Job:  [id number]  cc:   Dineen Kid. Reche Dixon, M.D. Fax: [phone]  Electronically Signed by Hillery Aldo M.D. on 02/11/2011 03:31:52 PM

## 2011-02-12 LAB — CBC
HCT: 23.9 % — ABNORMAL LOW (ref 36.0–46.0)
Hemoglobin: 7.4 g/dL — ABNORMAL LOW (ref 12.0–15.0)
MCH: 25.3 pg — ABNORMAL LOW (ref 26.0–34.0)
MCHC: 31 g/dL (ref 30.0–36.0)
MCV: 81.6 fL (ref 78.0–100.0)
RDW: 16.1 % — ABNORMAL HIGH (ref 11.5–15.5)

## 2011-02-12 LAB — BASIC METABOLIC PANEL
BUN: 20 mg/dL (ref 6–23)
CO2: 22 mEq/L (ref 19–32)
Calcium: 8.7 mg/dL (ref 8.4–10.5)
Creatinine, Ser: 1.13 mg/dL (ref 0.4–1.2)
GFR calc non Af Amer: 49 mL/min — ABNORMAL LOW (ref >60)
Glucose, Bld: 121 mg/dL — ABNORMAL HIGH (ref 70–99)

## 2011-02-13 LAB — CBC
Hemoglobin: 8.1 g/dL — ABNORMAL LOW (ref 12.0–15.0)
MCH: 24.6 pg — ABNORMAL LOW (ref 26.0–34.0)
MCHC: 30.3 g/dL (ref 30.0–36.0)
RDW: 16.1 % — ABNORMAL HIGH (ref 11.5–15.5)

## 2011-02-13 LAB — DIFFERENTIAL
Basophils Relative: 1 % (ref 0–1)
Lymphocytes Relative: 14 % (ref 12–46)
Monocytes Absolute: 0.5 10*3/uL (ref 0.1–1.0)
Monocytes Relative: 6 % (ref 3–12)
Neutro Abs: 7.3 10*3/uL (ref 1.7–7.7)
Neutrophils Relative %: 78 % — ABNORMAL HIGH (ref 43–77)

## 2011-02-13 LAB — BASIC METABOLIC PANEL
Calcium: 9.3 mg/dL (ref 8.4–10.5)
Creatinine, Ser: 0.93 mg/dL (ref 0.4–1.2)
GFR calc Af Amer: >60 mL/min (ref >60)

## 2011-02-13 LAB — CARDIAC PANEL(CRET KIN+CKTOT+MB+TROPI)
CK, MB: 2.1 ng/mL (ref 0.3–4.0)
Relative Index: INVALID (ref 0.0–2.5)
Troponin I: 0.01 ng/mL (ref 0.00–0.06)
Troponin I: 0.01 ng/mL (ref 0.00–0.06)

## 2011-02-13 LAB — MAGNESIUM: Magnesium: 2 mg/dL (ref 1.5–2.5)

## 2011-02-21 NOTE — H&P (Signed)
[name], [name] NO.:  [id number]  MEDICAL RECORD NO.:  [id number]           PATIENT TYPE:  E  LOCATION:  WLED                         FACILITY:  [facility]  PHYSICIAN:  Hillery Aldo, M.D.   DATE OF BIRTH:  1951-01-10  DATE OF ADMISSION:  02/07/2011 DATE OF DISCHARGE:                             HISTORY & PHYSICAL   PRIMARY CARE PHYSICIAN:  Insurance underwriter.  CARDIOLOGY:  Dr. Clarene Duke with [facility].  CHIEF COMPLAINT:  Nausea.  HISTORY OF PRESENT ILLNESS:  [name] is a 60 year old African American female with past medical history of atrial fibrillation and recent admission February 20 through March 2 secondary to GI bleed related to hemorrhoids in setting of supratherapeutic INR.  The patient presents to [facility] emergency department today with complaints of sudden onset of nausea last evening.  The patient states she has not been actively vomiting; however, she has been spitting up, quite frequently, white sputum.  She reports normal bowel movement yesterday with no recent diarrhea, melena, hematochezia, or abdominal pain.  She denies any recent fever or chills.  The patient does endorse some burning upon urination yesterday as well as some nonradiating midsternal chest pain last evening that is since resolved.  Upon evaluation in the ER, the patient continues to have persistent nausea with slightly elevated white count of 12,000.  The patient is to be admitted at this time by [facility] for further evaluation and treatment.  PAST MEDICAL HISTORY: 1. Recent admission, January 13, 2011 through January 24, 2011,     secondary to GI bleed related to hemorrhoids with supratherapeutic     INR.     a.     Patient also found to have mild gastritis and discharged on      PPI.     b.     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 Donell Beers. 2. History of  atrial fibrillation, currently off Coumadin secondary to     recent GI bleed. 3. Hypertension. 4. History of schizophrenia. 5. Hyperlipidemia. 6. Tobacco abuse. 7. Grade 1 diastolic dysfunction per 2D echocardiogram obtained     January 17, 2011, with left ventricular ejection fraction of 55%     to 60%. 8. Question of appendicitis in May 2010, treated conservatively with     antibiotic therapy only.  MEDICATIONS: 1. Potassium chloride 20 mEq p.o. daily. 2. Vytorin 10/20 p.o. q.h.s. 3. Dibucaine 1% ointment applied q.i.d. p.r.n. hemorrhoids. 4. Calcium 600 mg plus vitamin D 400 mg p.o. daily. 5. Acetaminophen 325 mg 2 tabs p.o. q.4 h. p.r.n. pain. 6. Metoprolol 25 mg tab, 1 tab p.o. q.a.m., half tab p.o. q.h.s. 7. Loratadine 10 mg p.o. daily. 8. Lisinopril 10 mg p.o. q.a.m. 9. Bisacodyl 5 mg p.o. as needed for constipation. 10.Protonix 40 mg p.o. daily. 11.Colace 100 mg p.o. q.h.s. 12.Furosemide 40 mg p.o. daily. 13.Amiodarone 200 mg p.o. daily. 14.Tramadol 50 mg p.o. b.i.d. p.r.n. pain. 15.Citalopram 20 mg p.o. daily 16.Aspirin 325 mg p.o. daily.  ALLERGIES:  PENICILLIN causes facial swelling.  FAMILY HISTORY:  Has been reviewed and is noncontributory to this admission.  SOCIAL HISTORY:  The patient is single.  She lives with her son and his children.  She is currently unemployed.  She smokes about a pack of cigarettes per day.  She denies any alcohol use.  REVIEW OF SYSTEMS:  As stated in the HPI, otherwise negative.  PHYSICAL EXAM:  VITAL SIGNS:  Blood pressure on arrival 208/124 now down to 176/88, heart rate 66, respirations 20, temperature 97.6, O2 sat is 99% on room air. GENERAL:  This is an [place] American female, slightly diaphoretic, in no acute distress. HEAD:  Normocephalic, atraumatic. EYES:  Extraocular movements are intact without scleral icterus or injection. EAR, NOSE, AND THROAT:  Mucous membranes are slightly dry.  No oropharyngeal lesions are  noted. NECK:  Supple with no thyromegaly or lymphadenopathy.  No JVD or carotid bruits. CARDIOVASCULAR:  S1-S2, regular rate and rhythm.  No murmur, rub, or gallop.  No lower extremity edema. RESPIRATORY:  Patient with decreased inspiratory effort.  Lung sounds are clear to auscultation bilaterally.  No wheezes, rales, or crackles. No increased work of breathing. GI:  Abdomen is soft, nontender, nondistended, with hypoactive bowel sounds.  No appreciated masses or hepatosplenomegaly.  No rebound tenderness. NEUROLOGICALLY:  The patient is able to move all extremities x4 without motor or sensory deficit on exam. PSYCHOLOGICALLY:  The patient is alert and oriented x4 with normal mood and affect.  PERTINENT LABS:  White cell count 12.3 with an absolute neutrophilic count of 10.0, platelet count 537, hemoglobin 9.2 up from 8.0 on March 2, hematocrit 30.5.  Sodium 141, potassium 3.3, chloride 109, CO2 22, BUN 19, creatinine 1.7 up from 1.08 on March 2, glucose 142.  Cardiac point of cares are negative x2.  At time of dictation, CMET and lipase are pending.  EKG shows sinus rhythm at 61 beats per minute with no acute T-wave abnormalities.  ASSESSMENT/PLAN: 1. Persistent nausea.  Unclear etiology at this point.  Question     infectious versus viral versus other.  Will admit the patient     overnight.  Will check liver function tests as well as lipase.     Will start with a plain film abdomen and consider CT pending     abdominal x-ray result.  Will ask staff to re-collect urine sample     as first sample is contaminated.  Will check urine culture and     sensitivity.  Will order for supportive treatment. 2. Leukocytosis.  The patient is afebrile and nontoxic appearing.     Physical exam does not show an acute abdomen.  Will hold empiric     treatment for now.  Again, will check acute abdominal film and     recheck urinalysis with culture and sensitivity. 3. Acute renal failure.  Suspect  prerenal in nature secondary to     dehydration in setting of problem #1 and with home diuretic and     angiotensin-converting enzyme inhibitor.  Will hold home     medications and order for very gentle intravenous fluids,     rechecking the patient's kidney function in the morning to     determine need for further workup. 4. Hypokalemia.  Will replete intravenously and recheck in the     morning. 5. Anemia.  Improved from January 24, 2011.  Will continue to monitor. 6. Hypertension, uncontrolled.  Improved since arrival to the     emergency department.  The patient has not taken her medications  this morning and is unable to tolerate p.o.'s right now.  Will     order for p.r.n. hydralazine. 7. History of atrial fibrillation.  Patient no longer on Coumadin     secondary to recent gastrointestinal bleed.  Will monitor the     patient on Telemetry Unit. 8. Recent chest pain.  The patient's symptoms are nonspecific.  No     evidence of coronary artery disease in review of the patient's     chart; however, given that the patient is a poor historian and     symptoms are nonspecific, will cycle cardiac enzymes to rule out     acute coronary syndrome. 9. Chronic diastolic heart failure.  Again, the patient actually     appears volume depleted on exam.  Will monitor volume status with     intravenous fluids. 10.Prophylaxis.  Will order for sequential compression devices for     deep vein thrombosis prophylaxis and proton pump inhibitor therapy     for gastrointestinal prophylaxis. 11.Code status.  The patient is a full code.     Cordelia Pen, NP   ______________________________ Hillery Aldo, M.D.    LE/MEDQ  D:  02/07/2011  T:  02/07/2011  Job:  [id number]  cc:   [facility]  Electronically Signed by Cordelia Pen NP on 02/14/2011 10:48:34 AM Electronically Signed by Hillery Aldo M.D. on 02/21/2011 07:05:13 AM

## 2011-03-01 LAB — URINALYSIS, ROUTINE W REFLEX MICROSCOPIC
Glucose, UA: NEGATIVE mg/dL
Hgb urine dipstick: NEGATIVE
Protein, ur: NEGATIVE mg/dL

## 2011-03-01 LAB — URINE MICROSCOPIC-ADD ON

## 2011-03-01 LAB — COMPREHENSIVE METABOLIC PANEL
AST: 30 U/L (ref 0–37)
Albumin: 4 g/dL (ref 3.5–5.2)
Alkaline Phosphatase: 70 U/L (ref 39–117)
Chloride: 104 mEq/L (ref 96–112)
GFR calc Af Amer: >60 mL/min (ref >60)
Potassium: 3 mEq/L — ABNORMAL LOW (ref 3.5–5.1)
Sodium: 138 mEq/L (ref 135–145)
Total Bilirubin: 0.7 mg/dL (ref 0.3–1.2)

## 2011-03-01 LAB — CBC
Platelets: 242 10*3/uL (ref 150–400)
WBC: 11.1 10*3/uL — ABNORMAL HIGH (ref 4.0–10.5)

## 2011-03-01 LAB — POCT CARDIAC MARKERS: Troponin i, poc: <0.05 ng/mL (ref 0.00–0.09)

## 2011-03-01 LAB — DIFFERENTIAL
Basophils Absolute: 0.1 10*3/uL (ref 0.0–0.1)
Basophils Relative: 1 % (ref 0–1)
Eosinophils Relative: 1 % (ref 0–5)
Lymphocytes Relative: 27 % (ref 12–46)
Monocytes Absolute: 0.5 10*3/uL (ref 0.1–1.0)

## 2011-03-04 LAB — CBC
HCT: 37.4 % (ref 36.0–46.0)
HCT: 40.9 % (ref 36.0–46.0)
Hemoglobin: 11.8 g/dL — ABNORMAL LOW (ref 12.0–15.0)
Hemoglobin: 12.6 g/dL (ref 12.0–15.0)
Hemoglobin: 13.3 g/dL (ref 12.0–15.0)
MCHC: 33.6 g/dL (ref 30.0–36.0)
MCV: 82.6 fL (ref 78.0–100.0)
MCV: 82.7 fL (ref 78.0–100.0)
RBC: 4.25 MIL/uL (ref 3.87–5.11)
RBC: 4.53 MIL/uL (ref 3.87–5.11)
RBC: 4.91 MIL/uL (ref 3.87–5.11)
RDW: 15.6 % — ABNORMAL HIGH (ref 11.5–15.5)
WBC: 11.2 10*3/uL — ABNORMAL HIGH (ref 4.0–10.5)
WBC: 14.5 10*3/uL — ABNORMAL HIGH (ref 4.0–10.5)

## 2011-03-04 LAB — COMPREHENSIVE METABOLIC PANEL
BUN: 7 mg/dL (ref 6–23)
CO2: 22 mEq/L (ref 19–32)
Calcium: 9.8 mg/dL (ref 8.4–10.5)
Creatinine, Ser: 0.96 mg/dL (ref 0.4–1.2)
GFR calc non Af Amer: 60 mL/min — ABNORMAL LOW (ref >60)
Glucose, Bld: 145 mg/dL — ABNORMAL HIGH (ref 70–99)

## 2011-03-04 LAB — BASIC METABOLIC PANEL
BUN: 7 mg/dL (ref 6–23)
CO2: 25 mEq/L (ref 19–32)
CO2: 27 mEq/L (ref 19–32)
Calcium: 8.9 mg/dL (ref 8.4–10.5)
Chloride: 107 mEq/L (ref 96–112)
Chloride: 107 mEq/L (ref 96–112)
Creatinine, Ser: 0.82 mg/dL (ref 0.4–1.2)
GFR calc Af Amer: >60 mL/min (ref >60)
Glucose, Bld: 148 mg/dL — ABNORMAL HIGH (ref 70–99)
Sodium: 140 mEq/L (ref 135–145)

## 2011-03-04 LAB — DIFFERENTIAL
Eosinophils Relative: 0 % (ref 0–5)
Monocytes Absolute: 0.7 10*3/uL (ref 0.1–1.0)
Monocytes Relative: 6 % (ref 3–12)
Neutrophils Relative %: 83 % — ABNORMAL HIGH (ref 43–77)

## 2011-03-04 LAB — LIPASE, BLOOD: Lipase: 15 U/L (ref 11–59)

## 2011-03-04 LAB — URINALYSIS, ROUTINE W REFLEX MICROSCOPIC
Bilirubin Urine: NEGATIVE
Nitrite: NEGATIVE
Specific Gravity, Urine: 1.012 (ref 1.005–1.030)
pH: 6 (ref 5.0–8.0)

## 2011-03-04 LAB — GLUCOSE, CAPILLARY: Glucose-Capillary: 137 mg/dL — ABNORMAL HIGH (ref 70–99)

## 2011-03-04 LAB — POCT CARDIAC MARKERS: Troponin i, poc: <0.05 ng/mL (ref 0.00–0.09)

## 2011-03-04 LAB — CARDIAC PANEL(CRET KIN+CKTOT+MB+TROPI)
CK, MB: 3.2 ng/mL (ref 0.3–4.0)
Relative Index: 1.4 (ref 0.0–2.5)
Troponin I: 0.03 ng/mL (ref 0.00–0.06)
Troponin I: 0.04 ng/mL (ref 0.00–0.06)

## 2011-03-04 LAB — URINE CULTURE: Colony Count: NO GROWTH

## 2011-03-04 LAB — PROTIME-INR
INR: 1.8 — ABNORMAL HIGH (ref 0.00–1.49)
INR: 2.9 — ABNORMAL HIGH (ref 0.00–1.49)

## 2011-03-04 LAB — TSH: TSH: 0.545 u[IU]/mL (ref 0.350–4.500)

## 2011-03-10 ENCOUNTER — Emergency Department (HOSPITAL_COMMUNITY): Payer: Medicaid Other

## 2011-03-10 ENCOUNTER — Encounter (HOSPITAL_COMMUNITY): Payer: Self-pay

## 2011-03-10 ENCOUNTER — Inpatient Hospital Stay (HOSPITAL_COMMUNITY): Payer: Medicaid Other

## 2011-03-10 ENCOUNTER — Inpatient Hospital Stay (HOSPITAL_COMMUNITY)
Admission: EM | Admit: 2011-03-10 | Discharge: 2011-03-14 | DRG: 872 | Disposition: A | Discharge status: Home / Self Care | Payer: Medicaid Other | Attending: Internal Medicine | Admitting: Internal Medicine

## 2011-03-10 DIAGNOSIS — E876 Hypokalemia: Secondary | ICD-10-CM | POA: Diagnosis present

## 2011-03-10 DIAGNOSIS — E860 Dehydration: Secondary | ICD-10-CM | POA: Diagnosis present

## 2011-03-10 DIAGNOSIS — I4892 Unspecified atrial flutter: Secondary | ICD-10-CM | POA: Diagnosis present

## 2011-03-10 DIAGNOSIS — I4891 Unspecified atrial fibrillation: Secondary | ICD-10-CM | POA: Diagnosis present

## 2011-03-10 DIAGNOSIS — I5032 Chronic diastolic (congestive) heart failure: Secondary | ICD-10-CM | POA: Diagnosis present

## 2011-03-10 DIAGNOSIS — D509 Iron deficiency anemia, unspecified: Secondary | ICD-10-CM | POA: Diagnosis present

## 2011-03-10 DIAGNOSIS — F17200 Nicotine dependence, unspecified, uncomplicated: Secondary | ICD-10-CM | POA: Diagnosis present

## 2011-03-10 DIAGNOSIS — F411 Generalized anxiety disorder: Secondary | ICD-10-CM | POA: Diagnosis present

## 2011-03-10 DIAGNOSIS — I10 Essential (primary) hypertension: Secondary | ICD-10-CM | POA: Diagnosis present

## 2011-03-10 DIAGNOSIS — F209 Schizophrenia, unspecified: Secondary | ICD-10-CM | POA: Diagnosis present

## 2011-03-10 DIAGNOSIS — I509 Heart failure, unspecified: Secondary | ICD-10-CM | POA: Diagnosis present

## 2011-03-10 DIAGNOSIS — R1031 Right lower quadrant pain: Secondary | ICD-10-CM | POA: Diagnosis present

## 2011-03-10 DIAGNOSIS — N179 Acute kidney failure, unspecified: Secondary | ICD-10-CM | POA: Diagnosis present

## 2011-03-10 DIAGNOSIS — R112 Nausea with vomiting, unspecified: Secondary | ICD-10-CM | POA: Diagnosis present

## 2011-03-10 DIAGNOSIS — K2990 Gastroduodenitis, unspecified, without bleeding: Secondary | ICD-10-CM | POA: Diagnosis present

## 2011-03-10 DIAGNOSIS — A419 Sepsis, unspecified organism: Principal | ICD-10-CM | POA: Diagnosis present

## 2011-03-10 DIAGNOSIS — K2970 Gastritis, unspecified, without bleeding: Secondary | ICD-10-CM | POA: Diagnosis present

## 2011-03-10 DIAGNOSIS — E785 Hyperlipidemia, unspecified: Secondary | ICD-10-CM | POA: Diagnosis present

## 2011-03-10 DIAGNOSIS — D72829 Elevated white blood cell count, unspecified: Secondary | ICD-10-CM | POA: Diagnosis present

## 2011-03-10 LAB — URINALYSIS, ROUTINE W REFLEX MICROSCOPIC
Bilirubin Urine: NEGATIVE
Nitrite: NEGATIVE
Nitrite: NEGATIVE
Protein, ur: 100 mg/dL — AB
Specific Gravity, Urine: 1.024 (ref 1.005–1.030)
Urobilinogen, UA: 1 mg/dL (ref 0.0–1.0)
Urobilinogen, UA: 0.2 mg/dL (ref 0.0–1.0)

## 2011-03-10 LAB — URINE MICROSCOPIC-ADD ON

## 2011-03-10 LAB — CBC
Hemoglobin: 11.9 g/dL — ABNORMAL LOW (ref 12.0–15.0)
Platelets: 534 10*3/uL — ABNORMAL HIGH (ref 150–400)
RBC: 4.95 MIL/uL (ref 3.87–5.11)
WBC: 20.5 10*3/uL — ABNORMAL HIGH (ref 4.0–10.5)

## 2011-03-10 LAB — DIFFERENTIAL
Basophils Absolute: 0 10*3/uL (ref 0.0–0.1)
Basophils Relative: 0 % (ref 0–1)
Eosinophils Absolute: 0 10*3/uL (ref 0.0–0.7)
Neutro Abs: 17.2 10*3/uL — ABNORMAL HIGH (ref 1.7–7.7)
Neutrophils Relative %: 84 % — ABNORMAL HIGH (ref 43–77)

## 2011-03-10 LAB — HEPATIC FUNCTION PANEL
ALT: 17 U/L (ref 0–35)
AST: 29 U/L (ref 0–37)
Albumin: 4.2 g/dL (ref 3.5–5.2)
Alkaline Phosphatase: 74 U/L (ref 39–117)
Indirect Bilirubin: 0.5 mg/dL (ref 0.3–0.9)
Total Protein: 8.6 g/dL — ABNORMAL HIGH (ref 6.0–8.3)

## 2011-03-10 LAB — POCT CARDIAC MARKERS
Myoglobin, poc: >500 ng/mL (ref 12–200)
Troponin i, poc: <0.05 ng/mL (ref 0.00–0.09)

## 2011-03-10 LAB — PROCALCITONIN: Procalcitonin: 0.32 ng/mL

## 2011-03-10 LAB — GLUCOSE, CAPILLARY: Glucose-Capillary: 154 mg/dL — ABNORMAL HIGH (ref 70–99)

## 2011-03-10 LAB — POCT I-STAT, CHEM 8
BUN: 32 mg/dL — ABNORMAL HIGH (ref 6–23)
Chloride: 99 mEq/L (ref 96–112)
Creatinine, Ser: 4.1 mg/dL — ABNORMAL HIGH (ref 0.4–1.2)
Sodium: 135 mEq/L (ref 135–145)
TCO2: 22 mmol/L (ref 0–100)

## 2011-03-10 LAB — TYPE AND SCREEN: Antibody Screen: NEGATIVE

## 2011-03-10 LAB — BLOOD GAS, VENOUS
Bicarbonate: 21.3 mEq/L (ref 20.0–24.0)
O2 Saturation: 39.3 %
Patient temperature: 98.6
TCO2: 19.8 mmol/L (ref 0–100)
pH, Ven: 7.352 — ABNORMAL HIGH (ref 7.250–7.300)
pO2, Ven: 28.8 mmHg — CL (ref 30.0–45.0)

## 2011-03-10 LAB — MRSA PCR SCREENING: MRSA by PCR: NEGATIVE

## 2011-03-10 LAB — LACTIC ACID, PLASMA: Lactic Acid, Venous: 3.5 mmol/L — ABNORMAL HIGH (ref 0.5–2.2)

## 2011-03-11 LAB — CBC
HCT: 31 % — ABNORMAL LOW (ref 36.0–46.0)
HCT: 29.7 % — ABNORMAL LOW (ref 36.0–46.0)
Hemoglobin: 9.4 g/dL — ABNORMAL LOW (ref 12.0–15.0)
MCHC: 31.3 g/dL (ref 30.0–36.0)
MCV: 76.5 fL — ABNORMAL LOW (ref 78.0–100.0)
MCV: 75.6 fL — ABNORMAL LOW (ref 78.0–100.0)
RBC: 4.05 MIL/uL (ref 3.87–5.11)
RDW: 16.5 % — ABNORMAL HIGH (ref 11.5–15.5)
WBC: 17.9 10*3/uL — ABNORMAL HIGH (ref 4.0–10.5)

## 2011-03-11 LAB — BASIC METABOLIC PANEL
BUN: 19 mg/dL (ref 6–23)
BUN: 13 mg/dL (ref 6–23)
CO2: 24 mEq/L (ref 19–32)
Calcium: 9.4 mg/dL (ref 8.4–10.5)
Chloride: 107 mEq/L (ref 96–112)
GFR calc non Af Amer: 57 mL/min — ABNORMAL LOW (ref >60)
Glucose, Bld: 125 mg/dL — ABNORMAL HIGH (ref 70–99)
Glucose, Bld: 115 mg/dL — ABNORMAL HIGH (ref 70–99)
Potassium: 3.8 mEq/L (ref 3.5–5.1)
Sodium: 137 mEq/L (ref 135–145)

## 2011-03-11 LAB — DIFFERENTIAL
Lymphocytes Relative: 7 % — ABNORMAL LOW (ref 12–46)
Lymphs Abs: 1.3 10*3/uL (ref 0.7–4.0)
Neutrophils Relative %: 85 % — ABNORMAL HIGH (ref 43–77)

## 2011-03-11 LAB — CLOSTRIDIUM DIFFICILE BY PCR: Toxigenic C. Difficile by PCR: NEGATIVE

## 2011-03-11 LAB — TSH: TSH: 0.637 u[IU]/mL (ref 0.350–4.500)

## 2011-03-12 ENCOUNTER — Inpatient Hospital Stay (HOSPITAL_COMMUNITY): Payer: Medicaid Other

## 2011-03-12 DIAGNOSIS — R112 Nausea with vomiting, unspecified: Secondary | ICD-10-CM

## 2011-03-12 DIAGNOSIS — R109 Unspecified abdominal pain: Secondary | ICD-10-CM

## 2011-03-12 LAB — DIFFERENTIAL
Lymphs Abs: 2 10*3/uL (ref 0.7–4.0)
Monocytes Relative: 5 % (ref 3–12)
Neutro Abs: 15.9 10*3/uL — ABNORMAL HIGH (ref 1.7–7.7)
Neutrophils Relative %: 84 % — ABNORMAL HIGH (ref 43–77)

## 2011-03-12 LAB — BASIC METABOLIC PANEL
BUN: 9 mg/dL (ref 6–23)
CO2: 22 mEq/L (ref 19–32)
Chloride: 106 mEq/L (ref 96–112)
Creatinine, Ser: 0.95 mg/dL (ref 0.4–1.2)

## 2011-03-12 LAB — CBC
Hemoglobin: 9.9 g/dL — ABNORMAL LOW (ref 12.0–15.0)
MCH: 23.9 pg — ABNORMAL LOW (ref 26.0–34.0)
MCV: 75.6 fL — ABNORMAL LOW (ref 78.0–100.0)
RBC: 4.14 MIL/uL (ref 3.87–5.11)

## 2011-03-12 LAB — AMYLASE: Amylase: 58 U/L (ref 0–105)

## 2011-03-12 LAB — LIPASE, BLOOD: Lipase: 27 U/L (ref 11–59)

## 2011-03-12 MED ORDER — TECHNETIUM TC 99M MEBROFENIN IV KIT
5.2000 | PACK | Freq: Once | INTRAVENOUS | Status: AC | PRN
  Administered 2011-03-12: 5 via INTRAVENOUS

## 2011-03-13 ENCOUNTER — Inpatient Hospital Stay (HOSPITAL_COMMUNITY): Payer: Medicaid Other

## 2011-03-13 LAB — CBC
MCH: 23 pg — ABNORMAL LOW (ref 26.0–34.0)
Platelets: 328 10*3/uL (ref 150–400)
RBC: 3.74 MIL/uL — ABNORMAL LOW (ref 3.87–5.11)
RDW: 17.2 % — ABNORMAL HIGH (ref 11.5–15.5)
WBC: 15 10*3/uL — ABNORMAL HIGH (ref 4.0–10.5)

## 2011-03-13 LAB — COMPREHENSIVE METABOLIC PANEL
AST: 16 U/L (ref 0–37)
Albumin: 3 g/dL — ABNORMAL LOW (ref 3.5–5.2)
Chloride: 105 mEq/L (ref 96–112)
Creatinine, Ser: 0.98 mg/dL (ref 0.4–1.2)
GFR calc Af Amer: >60 mL/min (ref >60)
Total Bilirubin: 0.8 mg/dL (ref 0.3–1.2)
Total Protein: 6.4 g/dL (ref 6.0–8.3)

## 2011-03-14 LAB — STOOL CULTURE

## 2011-03-14 LAB — BASIC METABOLIC PANEL
BUN: 9 mg/dL (ref 6–23)
CO2: 24 mEq/L (ref 19–32)
Chloride: 107 mEq/L (ref 96–112)
Creatinine, Ser: 1.06 mg/dL (ref 0.4–1.2)
Glucose, Bld: 86 mg/dL (ref 70–99)

## 2011-03-14 LAB — CBC
HCT: 25.5 % — ABNORMAL LOW (ref 36.0–46.0)
Hemoglobin: 8 g/dL — ABNORMAL LOW (ref 12.0–15.0)
MCH: 24.1 pg — ABNORMAL LOW (ref 26.0–34.0)
MCV: 76.8 fL — ABNORMAL LOW (ref 78.0–100.0)
RBC: 3.32 MIL/uL — ABNORMAL LOW (ref 3.87–5.11)

## 2011-03-16 LAB — CULTURE, BLOOD (ROUTINE X 2)
Culture  Setup Time: 201204162317
Culture: NO GROWTH

## 2011-03-18 NOTE — H&P (Signed)
[name], BOYDEN NO.:  [id number]  MEDICAL RECORD NO.:  [id number]           PATIENT TYPE:  E  LOCATION:  WLED                         FACILITY:  [facility]  PHYSICIAN:  Erick Blinks, MD     DATE OF BIRTH:  [birth year]  DATE OF ADMISSION:  03/10/2011 DATE OF DISCHARGE:                             HISTORY & PHYSICAL   PRIMARY CARE PHYSICIAN:  [facility].  CHIEF COMPLAINT:  Abdominal pain.  HISTORY OF PRESENT ILLNESS:  [name] who presents to the hospital with complaints of abdominal pain.  The patient reports onset of her symptoms approximately 2 to 3 days ago when she was visiting her daughter.  The patient reports that 3 to 4 hours after dinner the patient started to have abdominal pain and vomiting.  She has vomited multiple times and is unable to keep anything down.  She reports suprapubic and right lower quadrant abdominal pain.  She reports feeling feverish at home, also having some chills at that time as well.  Denies any dysuria.  She says she has not had any diarrhea, although during my exam she has had to move her bowels twice with liquidy stool.  She denies any chest pain or shortness of breath.  She has no significant cough.  She feels generally weak and fatigued and often dizzy upon standing.  On arrival to the emergency room, the patient was found to have elevated white count of 20,000 and acute renal failure with a creatinine of 4.1.  She has been referred for admission.  PAST MEDICAL HISTORY: 1. Hypertension. 2. Hyperlipidemia. 3. History of atrial fibrillation, not a candidate for Coumadin due to     GI bleeding. 4. Chronic diastolic congestive heart failure. 5. Schizophrenia. 6. Tobacco abuse. 7. Chronic normocytic anemia.  ALLERGIES:  PENICILLIN which causes swelling  MEDICATIONS:  Prior to admission: 1. Xanax. 2. Aspirin. 3. Calcium. 4. Feosol. 5. Ferrex. 6. Furosemide. 7. Lisinopril. 8.  Metoclopramide. 9. Metoprolol. 10.MiraLax. 11.Potassium. 12.Stool softener. 13.Vytorin.  SOCIAL HISTORY:  The patient smokes less than half a pack a day.  She denies any alcohol use and she denies any illicit drug use.  She lives by herself.  FAMILY HISTORY:  Noncontributory to this admission.  REVIEW OF SYSTEMS:  As per HPI.  PHYSICAL EXAMINATION:  VITAL SIGNS:  Temperature 97.6, respiratory rate 20, heart rate was initially 102 and currently ranging between 70 and occasionally going up to 120, blood pressure initially 92/56 and currently 107/63, pulse ox of 100%. GENERAL:  The patient appears uncomfortable, lying in bed. HEENT:  Normocephalic, atraumatic.  Mucous membranes are very dry. NECK:  Supple. CHEST:  Clear to auscultation bilaterally. CARDIAC:  Exam shows S1, S2 with irregular rate and rhythm. ABDOMEN:  Soft, nontender.  Bowel sounds are active. EXTREMITIES:  Showed no signs of cyanosis, clubbing or edema. NEUROLOGIC:  The patient has equal strength bilaterally.  Cranial nerves II through XII appear grossly intact.LABORATORY DATA:  WBC 20.5, hemoglobin 11.9, platelet count of 534, lactic acid of 3.5, CK of 235.  Cardiac enzymes point-of-care negative. Liver function tests within normal limits.  Sodium 135, potassium  3.2, chloride 99, bicarb 22, BUN 32, creatinine 4.1, glucose of 135, ionized calcium of 1.21.  ABG shows pH of 7.35.  CT of abdomen and pelvis does not show any significant change from prior films.  EKG shows sinus rhythm with frequent PVCs and PACs.  ASSESSMENT/PLAN: 1. Sepsis, unclear etiology. 2. Acute renal failure. 3. Nausea and vomiting., possibly gastroenteritis. 4. Leukocytosis. 5. History of hypertension. 6. Hyperlipidemia. 7. History of atrial fibrillation, not a candidate for Coumadin. 8. Chronic diastolic congestive heart failure. 9. Tobacco abuse. 10.Hypokalemia. 11.Full code.  PLAN:  Due to the patient's current presentation, we  will admit her to the step-down unit for closer observation.  There is definitely a concern for sepsis due to the hemodynamic instability, elevated WBC count, and lactic acid, although right now clear source of infection is not found, we will check a urinalysis as well as a chest x-ray as this has not been checked in the emergency room.  We will start the patient empirically on antibiotics with vancomycin and Primaxin since she is allergic to penicillin.  We will check blood cultures, urine culture, and since the patient has had frequent bowel movements here in the emergency room and she has recently been hospitalized, we will check stool C diff as well as culture and ova and parasites.  Regarding her renal failure, this is likely secondary to dehydration in the setting of an ACE inhibitor.  We will check a renal ultrasound to rule out any structural abnormalities and continue with IV hydration.  The patient currently has blood pressure on lower side.  We will continue to hold her antihypertensives for now until her blood pressure improves.  We will ask for tobacco cessation consult.  Further orders will be per the clinical course.   Erick Blinks, MD     JM/MEDQ  D:  03/10/2011  T:  03/10/2011  Job:  [id number]  Electronically Signed by Durward Mallard Eusebio Blazejewski  on 03/18/2011 09:43:49 PM

## 2011-03-26 NOTE — Discharge Summary (Signed)
[name], [name]            ACCOUNT NO.:  [id number]  MEDICAL RECORD NO.:  [id number]           PATIENT TYPE:  I  LOCATION:  1422                         FACILITY:  [facility]  PHYSICIAN:  Kathlen Mody, MD       DATE OF BIRTH:  28-Nov-1950  DATE OF ADMISSION:  03/10/2011 DATE OF DISCHARGE:  03/14/2011                              DISCHARGE SUMMARY   PRIMARY CARE PHYSICIAN:  [facility].  DISCHARGE DIAGNOSES: 1. Gastroenteritis. 2. Acute renal failure. 3. Hypertension. 4. Hyperlipidemia. 5. Atrial fibrillation, not on Coumadin. 6. Diastolic congestive heart failure. 7. Hypokalemia. 8. Tobacco abuse. 9. Normocytic anemia. 10.Schizophrenia.  DISCHARGE MEDICATIONS: 1. Hydralazine 25 mg 3 times a day. 2. Zofran 4 mg every 6 hours. 3. Protonix 40 mg daily. 4. Metoprolol 25 mg b.i.d. 5. Alprazolam 0.5 mg 3 times a day. 6. Aspirin 325 mg daily 7. Celexa 1 tablet daily. 8. Docusate 100 mg at bedtime. 9. Metoclopramide 5 mg t.i.d. a.c. 10.Nu-Iron 150 mg daily. 11.Calcium tablets 500/125 mg 1 tablet daily. 12.Polyethylene glycol 17 g as needed. 13.Potassium chloride 20 mEq daily. 14.Vytorin 10/20 mg daily at bedtime.  LABORATORY DATA:  Pertinent labs, on admission, the patient had CBC which showed a WBC count of 20.5, hemoglobin of 11.9, hematocrit of 36.9, platelets of 534,000.  Point-of-care cardiac markers showed a myoglobin of 500.  Hepatic panel showed a total protein of 8.6, CK was 235.  Lactic acid was 3.5.  Urinalysis showed negative for nitrites and leukocytes.  Stool occult was positive.  Procalcitonin was normal.  MRSA PCR screen negative.  TSH was normal.  Repeat CBC the next day showed a WBC count 17.9, hemoglobin of 9.4.  C. diff was negative.  Urine culture showed no growth.  On the day of discharge, the patient's CBC showed a WBC count of 11.2, hemoglobin of 8, hematocrit of 25.5, MCV of 76.8. Basic metabolic panel showed a sodium of 137, potassium of  3.4, creatinine of 1.06, BUN of 9.  On admission, the patient's creatinine was 4.1.  RADIOLOGICAL STUDIES:  The patient had CT abdomen and pelvis without contrast which showed increased density demonstrating the gallbladder. No evidence of focal infiltration, fluid collection or lymphadenopathy. No change since prior study.  Chest x-ray showed increased prominence in the right lung base, could represent a typical infection.  No consolidated airspace disease.  Ultrasound of the kidney did not show any hydronephrosis.  HIDA scan was normal.  Repeat x-ray did not show any acute cardiopulmonary disease and the chest x-ray also showed no acute cardiopulmonary disease.  Mild pulmonary venous congestion cannot be excluded.  CONSULTS:  GI consult called.  PROCEDURES:  None.  BRIEF HOSPITAL COURSE:  I have a 59 year old lady with history of atrial fibrillation, not a candidate for Coumadin, secondary to GI bleed, chronic diastolic heart failure, schizophrenia, hypertension, hyperlipidemia, chronic abdominal pain with complaints of abdominal pain, nausea, vomiting and decreased p.o. intake on admission.  She was initially admitted for possible gastroenteritis.  She was put n.p.o. She was also worked up for sepsis.  Her procalcitonin was normal.  Even though on admission, she was found to have  high leukocytosis.  Her chest x-ray was clear.  Her CT abdomen and pelvis was clear.  HIDA scan was negative.  Her leukocytosis could be secondary to dehydration, as her creatinine was 4.1 on admission.  The patient was also empirically started on antibiotics, vancomycin and Primaxin.  She could be having a urinary tract infection.  Her UA was clean and the culture also did not grow any organisms.  Over the days of her hospitalization, her WBC count was gradually improving.  She was afebrile and her antibiotics were stopped.  Over the days of her hospitalization, her nausea, vomiting has also improved.   Gastroenterology consult was also called, who recommended HIDA scan.  Further increased density in the CT abdomen and pelvis.  HIDA scan was normal.  GI suggested that her symptoms of nausea, vomiting, could be secondary to gastroparesis.  She was on Reglan already t.i.d. a.c. and h.s.  On the day of discharge, the patient's nausea and vomiting has completely resolved.  We could not find a source of infection.  Her antibiotics were stopped and she remained afebrile over 48 hours.  Her WBC count was 11.2.  Acute renal failure and that the patient's creatinine was 4.1.  Urine was clean.  Ultrasound of kidney did not show any hydronephrosis.  She was most likely dehydrated and IV fluids were given.  Her creatinine normalized.  Hypertension, controlled.  Hyperlipidemia, controlled.  Chronic atrial fibrillation, not on Coumadin secondary to GI bleed.  She was on aspirin and she was controlled with metoprolol.  Tobacco cessation, counseling was given. Hypokalemia: kd ur 40 meq was given prior to discharge.   On the day of discharge, the patient's vitals include temperature of 98.2, pulse is 72, respirations 18, blood pressure of 100/58, saturating 100% on room air.  Exam was within normal limits.  FOLLOWUP:  Follow up with PCP in about 1 to 2 weeks.  Follow up with the gastroenterologist in about 2 to 4 weeks as needed.          ______________________________ Kathlen Mody, MD     VA/MEDQ  D:  03/25/2011  T:  03/26/2011  Job:  [id number]  Electronically Signed by Kathlen Mody MD on 03/26/2011 04:59:19 PM

## 2011-04-06 ENCOUNTER — Emergency Department (HOSPITAL_COMMUNITY)
Admission: EM | Admit: 2011-04-06 | Discharge: 2011-04-06 | Disposition: A | Discharge status: Home / Self Care | Payer: Self-pay | Attending: Emergency Medicine | Admitting: Emergency Medicine

## 2011-04-06 ENCOUNTER — Emergency Department (HOSPITAL_COMMUNITY): Payer: Self-pay

## 2011-04-06 DIAGNOSIS — Z7982 Long term (current) use of aspirin: Secondary | ICD-10-CM | POA: Insufficient documentation

## 2011-04-06 DIAGNOSIS — I509 Heart failure, unspecified: Secondary | ICD-10-CM | POA: Insufficient documentation

## 2011-04-06 DIAGNOSIS — E785 Hyperlipidemia, unspecified: Secondary | ICD-10-CM | POA: Insufficient documentation

## 2011-04-06 DIAGNOSIS — Z8659 Personal history of other mental and behavioral disorders: Secondary | ICD-10-CM | POA: Insufficient documentation

## 2011-04-06 DIAGNOSIS — R109 Unspecified abdominal pain: Secondary | ICD-10-CM | POA: Insufficient documentation

## 2011-04-06 DIAGNOSIS — F411 Generalized anxiety disorder: Secondary | ICD-10-CM | POA: Insufficient documentation

## 2011-04-06 DIAGNOSIS — I4891 Unspecified atrial fibrillation: Secondary | ICD-10-CM | POA: Insufficient documentation

## 2011-04-06 DIAGNOSIS — Z79899 Other long term (current) drug therapy: Secondary | ICD-10-CM | POA: Insufficient documentation

## 2011-04-06 DIAGNOSIS — R112 Nausea with vomiting, unspecified: Secondary | ICD-10-CM | POA: Insufficient documentation

## 2011-04-06 DIAGNOSIS — I10 Essential (primary) hypertension: Secondary | ICD-10-CM | POA: Insufficient documentation

## 2011-04-06 DIAGNOSIS — R61 Generalized hyperhidrosis: Secondary | ICD-10-CM | POA: Insufficient documentation

## 2011-04-06 LAB — COMPREHENSIVE METABOLIC PANEL
Alkaline Phosphatase: 72 U/L (ref 39–117)
BUN: 19 mg/dL (ref 6–23)
CO2: 20 mEq/L (ref 19–32)
Chloride: 105 mEq/L (ref 96–112)
Glucose, Bld: 121 mg/dL — ABNORMAL HIGH (ref 70–99)
Potassium: 2.9 mEq/L — ABNORMAL LOW (ref 3.5–5.1)
Total Bilirubin: 0.2 mg/dL — ABNORMAL LOW (ref 0.3–1.2)

## 2011-04-06 LAB — URINALYSIS, ROUTINE W REFLEX MICROSCOPIC
Bilirubin Urine: NEGATIVE
Glucose, UA: NEGATIVE mg/dL
Ketones, ur: NEGATIVE mg/dL
pH: 7 (ref 5.0–8.0)

## 2011-04-06 LAB — POCT I-STAT, CHEM 8
BUN: 20 mg/dL (ref 6–23)
Calcium, Ion: 1.02 mmol/L — ABNORMAL LOW (ref 1.12–1.32)
Chloride: 109 mEq/L (ref 96–112)
Glucose, Bld: 117 mg/dL — ABNORMAL HIGH (ref 70–99)

## 2011-04-06 LAB — DIFFERENTIAL
Eosinophils Relative: 1 % (ref 0–5)
Lymphocytes Relative: 20 % (ref 12–46)
Lymphs Abs: 2.8 10*3/uL (ref 0.7–4.0)
Monocytes Relative: 6 % (ref 3–12)

## 2011-04-06 LAB — CBC
HCT: 24 % — ABNORMAL LOW (ref 36.0–46.0)
MCH: 21.9 pg — ABNORMAL LOW (ref 26.0–34.0)
MCV: 71.9 fL — ABNORMAL LOW (ref 78.0–100.0)
RBC: 3.34 MIL/uL — ABNORMAL LOW (ref 3.87–5.11)
RDW: 17.5 % — ABNORMAL HIGH (ref 11.5–15.5)
WBC: 13.7 10*3/uL — ABNORMAL HIGH (ref 4.0–10.5)

## 2011-04-06 LAB — LIPASE, BLOOD: Lipase: 40 U/L (ref 11–59)

## 2011-04-06 LAB — GLUCOSE, CAPILLARY: Glucose-Capillary: 124 mg/dL — ABNORMAL HIGH (ref 70–99)

## 2011-04-07 LAB — URINE CULTURE

## 2011-04-08 NOTE — Discharge Summary (Signed)
NAMEENEDINA, PAIR NO.:  [id number]   MEDICAL RECORD NO.:  [id number]          PATIENT TYPE:  INP   LOCATION:  3741                         FACILITY:  [facility]   PHYSICIAN:  Thereasa Solo. Little, M.D. DATE OF BIRTH:  [birth year]   DATE OF ADMISSION:  04/19/2009  DATE OF DISCHARGE:  04/21/2009                               DISCHARGE SUMMARY   ADDENDUM   After discussion with Dr. Daiva Eves and Dr. Lindie Spruce, it was decided to send  Ms. Escalante home on Cipro 500 mg b.i.d. for 7 days and Flagyl 500 mg  t.i.d. for 7 days.  She is allergic to PENICILLIN.  We realize this may  interfere with her Coumadin, but Dr. Daiva Eves felt there was really no  other good choice for antibiotics.  That being the case, we will hold  her Coumadin today, Apr 21, 2009 and starting tomorrow, Apr 22, 2009.  She will take half her usual dose, she will take 2.5 mg daily.  She will  have a protime on Tuesday at [facility].  She had been instructed to  contact Dr. Rosezena Sensor office for followup next week.  These instructions  will be also given to her family.      Abelino Derrick, P.A.    ______________________________  Thereasa Solo. Little, M.D.    Lenard Lance  D:  04/21/2009  T:  04/22/2009  Job:  [id number]

## 2011-04-08 NOTE — Consult Note (Signed)
NAMENAYLAH, CORK NO.:  [id number]   MEDICAL RECORD NO.:  [id number]          PATIENT TYPE:  INP   LOCATION:  2922                         FACILITY:  [facility]   PHYSICIAN:  Maisie Fus A. Cornett, M.D.DATE OF BIRTH:  Aug 15, 1951   DATE OF CONSULTATION:  04/19/2009  DATE OF DISCHARGE:                                 CONSULTATION   TIME OF CONSULTATION:  At 07:40 a.m.   REQUESTING PHYSICIAN:  April Palumbo-Rasch, MD in the emergency  department at [facility].   CONSULTING SURGEON:  Thomas A. Cornett, MD   ADMITTING PHYSICIAN:  Richard A. Alanda Amass, MD, Cardiology.   PRIMARY CARE PHYSICIAN:  [facility].   REASON FOR CONSULTATION:  Acute appendicitis.   HISTORY OF PRESENT ILLNESS:  Ms. Barman is a 60 year old black female  with a history of atrial fibrillation, myocardial infarction 4 years  ago, and a question of DVT versus a PE per the patient, hypertension,  dyslipidemia, and borderline diabetes mellitus, who presented to [facility] Emergency Department last night with a complaint of nausea,  vomiting, and right lower quadrant abdominal pain.  The patient  currently states that she began to feel sick with nausea and vomiting on  Monday morning.  She then states that she began to get right lower  quadrant abdominal pain Monday evening.  She states that this pain  occasionally radiates to her umbilicus, but mostly stays in her right  lower quadrant.  She states at this time she has been unable to eat or  drink anything because she cannot keep it down.  She denies any fevers  or chills.  At this time due her pain as well as nausea and vomiting,  she presented to the emergency department where she was found to be in  atrial fibrillation with rapid ventricular response with a pulse of 158.  Also, due to her abdominal pain, a CT scan of the abdomen and pelvis  were completed, which subsequently showed acute appendicitis without  evidence of abscess or rupture.   She was then transferred to [facility]  due to her cardiac history and placed on IV diltiazem.  At this time, we  were consulted secondary to the patient's acute appendicitis.   REVIEW OF SYSTEMS:  Please see HPI.  The patient admits to having  abdominal pain, nausea, and vomiting with some slight diarrhea.  No  blood was seen.  She does admit to having atrial fibrillation and  myocardial infarction 4 years ago in [place] while visiting some  friends.  She also states that she was placed on her Coumadin in  [place] because she got told she had a blood clot, but she is not  sure where the blood clot was.  Since then, she has not followed up with  the cardiologist.  She only follows up with [facility].  All other  systems are currently negative.   PAST MEDICAL HISTORY:  1. History of atrial fibrillation.  2. History of myocardial infarction 4 years ago in [place].  3. History of hypertension.  4. History of dyslipidemia.  5. History of borderline diabetes  mellitus.  6. Questionable history of DVT versus PE.   PAST SURGICAL HISTORY:  None.   SOCIAL HISTORY:  The patient is widowed and has 4 children.  She admits  to smoking less than half pack of cigarettes a day.  She denies alcohol,  but does admit to occasional marijuana use.   ALLERGIES:  PENICILLIN.   MEDICATIONS:  1. Vytorin 10/20 mg.  2. Toprol-XL 50 mg.  3. Tiazac 300 mg.  4. Coumadin 5 mg.  5. Budesonide spray 2 puffs daily.  6. Currently, the patient is on diltiazem 30 mg an hour .   PHYSICAL EXAMINATION:  GENERAL:  Ms. Abee is a 60 year old black  female who is well developed, well nourished, and currently lying in  bed, in no acute distress.  VITAL SIGNS:  Temperature has not been recorded, pulse ranges anywhere  between 110 and 140, respirations anywhere from 13-24, and blood  pressure ranging from 111/62 to 169/90.  HEENT:  Head is normocephalic and atraumatic.  Sclerae noninjected.   Pupils are equal, round, and reactive to light.  Ears and nose without  any obvious masses or lesions.  No rhinorrhea.  Mouth is pink and dry.  Throat shows no exudate.  NECK:  Supple.  Trachea is midline.  No thyromegaly.  HEART:  Irregularly irregular and tachycardiac without any evidence of  murmurs, gallops, or rubs.  +2 carotid, radial, and pedal pulses  bilaterally.  LUNGS:  Clear to auscultation bilaterally.  No wheezes, rhonchi, or  rales noted.  Respiratory effort is nonlabored.  ABDOMEN:  Soft, nontender, and nondistended with active bowel sounds.  Peritoneal signs are present.  No scars or hernias are noted.  The  patient does state that she got pain medicine, however, the nurse states  that she got pain medicine at 05:30 this a.m. which was 0.5 mg dose of  Dilaudid and still the patient currently has no abdominal pain.  MUSCULOSKELETAL:  All 4 extremities are symmetrical with no cyanosis,  clubbing, or edema noted.  NEUROLOGIC:  Cranial nerves II through XII appear to be grossly intact.  PSYCH:  The patient is alert and oriented x3 with an appropriate affect.   LABORATORY DATA AND DIAGNOSTICS:  White blood cell count 14,500,  hemoglobin 12.6, hematocrit 37.4, and platelet count is 257,000.  Sodium  140, potassium 3.0, glucose 148, BUN 7, creatinine 0.82, and INR 2.8.  Cardiac markers were all negative.  CT of the abdomen and pelvis shows  acute appendicitis without evidence of abscess or perforation.  Chest x-  ray shows minimal cardiac enlargement and a recent EKG shows atrial  fibrillation with rapid ventricular response.   IMPRESSION:  1. Acute appendicitis.  2. Atrial fibrillation with rapid ventricular response.  3. History of myocardial infarction 4 years ago in [place].  4. History of hypertension.  5. Dyslipidemia.  6. Borderline diabetes mellitus.  7. Hypokalemia.  8. History of deep vein thrombosis versus pulmonary embolism in the      past per the  patient.   PLAN:  At this time, not much is known about the patient's cardiac  history from when she was in [place] 4 years ago.  She currently  does not follow up with the cardiologist or has not seen a cardiologist  in the past 4 years.  She is followed, however, by [facility].  The  patient is stable and at this time, we would recommend starting the  patient on broad-spectrum antibiotics including Invanz and  allow  Cardiology to sort the patient out and make sure that she is stable from  a cardiac standpoint in case the patient does not improve with  conservative measures and would need surgical intervention.  I agree  with holding Coumadin at this time as her INR is 2.8.  If the patient  does need surgical intervention in the future and her INR continues to  remain supratherapeutic, then the patient may need FFP at that time.  Otherwise, the patient has low potassium and therefore, we will replace  this.  Otherwise, we will follow the patient closely with you.      Letha Cape, PA      Maisie Fus A. Cornett, M.D.  Electronically Signed    KEO/MEDQ  D:  04/19/2009  T:  04/19/2009  Job:  [id number]   cc:   [facility]  Richard A. Alanda Amass, M.D.

## 2011-04-08 NOTE — Discharge Summary (Signed)
[name], [name] NO.:  [id number]   MEDICAL RECORD NO.:  [id number]          PATIENT TYPE:  INP   LOCATION:  3741                         FACILITY:  [facility]   PHYSICIAN:  Thereasa Solo. Little, M.D. DATE OF BIRTH:  1951/03/12   DATE OF ADMISSION:  04/19/2009  DATE OF DISCHARGE:  04/21/2009                               DISCHARGE SUMMARY   DISCHARGE DIAGNOSES:  1. Paroxysmal atrial fibrillation, the patient has been on Coumadin.      Review of her records from [facility] indicates she      had atrial fibrillation at that time as well.  2. Possible acute appendicitis, plan is for conservative therapy.  3. History of schizophrenia according to [facility] records      from 2006.  4. History of smoking.  5. Treated dyslipidemia.   HOSPITAL COURSE:  The patient is a 60 year old female who is followed at  [facility], who was admitted to the [facility] in 2006.  She presented to [facility] on Apr 19, 2009, with abdominal pain,  nausea, vomiting, and was suspected to have an acute appendicitis.  She  was noted to be in atrial fibrillation.  She is on Coumadin.  We were  asked to see her in consult.  Plan was to hold her Coumadin and she was  put on antibiotics.  The patient initially said that she had had a  stent, had a heart attack in 2006.  Subsequent review of her records  from [facility] from 2006 indicate that she had atrial  fibrillation.  There is no mention of coronary disease or heart  catheterization and later, the patient denied ever saying that she had  had a stent or heart cath.  From a surgical standpoint, the plan is to  continue with conservative therapy at this time since she is stable.  She will follow up with surgeons as an outpatient.  She will continue  with Coumadin.   LABORATORY DATA:  White count 11.2, hemoglobin 11.8, hematocrit 35,  platelets 224.  INR is 2.9.  Sodium 140, potassium  3.4, BUN 14,  creatinine 1.12.  CK-MB and troponins were negative.  TSH is 0.54.  Urinalysis is unremarkable.  Liver functions were normal.  CT scan shows  no acute findings in the abdomen with lumbar spondylosis.  CT of the  pelvis suggestive of acute appendicitis without abscess or perforation.   DISCHARGE MEDICATIONS:  1. Vytorin 10/20 daily.  2. Diltiazem 300 mg a day.  3. Coumadin 5 mg a day or as directed.  4. Metoprolol 50 mg a day.  5. Diphenhydramine 50 mg at bedtime.  6. Antibiotics to be determined at discharge.   DISPOSITION:  The patient will be discharged to home.  She will have a  pro times followed up with [facility] as prior to admission.  We will  see her back in the office in a couple of weeks.   Echocardiogram in 2006 showed normal LV function, that was from  [facility].      Abelino Derrick, P.A.    ______________________________  Thereasa Solo. Little, M.D.    Lenard Lance  D:  04/20/2009  T:  04/21/2009  Job:  [id number]   cc:   Gerlene Burdock A. Alanda Amass, M.D.  [facility]  [facility]

## 2011-05-12 ENCOUNTER — Inpatient Hospital Stay (HOSPITAL_COMMUNITY)
Admission: EM | Admit: 2011-05-12 | Discharge: 2011-05-17 | DRG: 392 | Disposition: A | Discharge status: Home / Self Care | Payer: Medicaid Other | Attending: Internal Medicine | Admitting: Internal Medicine

## 2011-05-12 ENCOUNTER — Emergency Department (HOSPITAL_COMMUNITY): Payer: Medicaid Other

## 2011-05-12 ENCOUNTER — Inpatient Hospital Stay (HOSPITAL_COMMUNITY): Payer: Medicaid Other

## 2011-05-12 DIAGNOSIS — E785 Hyperlipidemia, unspecified: Secondary | ICD-10-CM | POA: Diagnosis present

## 2011-05-12 DIAGNOSIS — D72829 Elevated white blood cell count, unspecified: Secondary | ICD-10-CM | POA: Diagnosis present

## 2011-05-12 DIAGNOSIS — F209 Schizophrenia, unspecified: Secondary | ICD-10-CM | POA: Diagnosis present

## 2011-05-12 DIAGNOSIS — F17200 Nicotine dependence, unspecified, uncomplicated: Secondary | ICD-10-CM | POA: Diagnosis present

## 2011-05-12 DIAGNOSIS — T454X5A Adverse effect of iron and its compounds, initial encounter: Secondary | ICD-10-CM | POA: Diagnosis present

## 2011-05-12 DIAGNOSIS — K644 Residual hemorrhoidal skin tags: Secondary | ICD-10-CM | POA: Diagnosis present

## 2011-05-12 DIAGNOSIS — I10 Essential (primary) hypertension: Secondary | ICD-10-CM | POA: Diagnosis present

## 2011-05-12 DIAGNOSIS — R112 Nausea with vomiting, unspecified: Secondary | ICD-10-CM | POA: Diagnosis present

## 2011-05-12 DIAGNOSIS — E860 Dehydration: Secondary | ICD-10-CM | POA: Diagnosis present

## 2011-05-12 DIAGNOSIS — N179 Acute kidney failure, unspecified: Secondary | ICD-10-CM | POA: Diagnosis present

## 2011-05-12 DIAGNOSIS — D649 Anemia, unspecified: Secondary | ICD-10-CM | POA: Diagnosis present

## 2011-05-12 DIAGNOSIS — K3184 Gastroparesis: Principal | ICD-10-CM | POA: Diagnosis present

## 2011-05-12 DIAGNOSIS — Z7982 Long term (current) use of aspirin: Secondary | ICD-10-CM

## 2011-05-12 DIAGNOSIS — Z79899 Other long term (current) drug therapy: Secondary | ICD-10-CM

## 2011-05-12 DIAGNOSIS — I5032 Chronic diastolic (congestive) heart failure: Secondary | ICD-10-CM | POA: Diagnosis present

## 2011-05-12 DIAGNOSIS — I509 Heart failure, unspecified: Secondary | ICD-10-CM | POA: Diagnosis present

## 2011-05-12 DIAGNOSIS — I4891 Unspecified atrial fibrillation: Secondary | ICD-10-CM | POA: Diagnosis present

## 2011-05-12 DIAGNOSIS — E876 Hypokalemia: Secondary | ICD-10-CM | POA: Diagnosis present

## 2011-05-12 DIAGNOSIS — K648 Other hemorrhoids: Secondary | ICD-10-CM | POA: Diagnosis present

## 2011-05-12 LAB — POCT I-STAT, CHEM 8
BUN: 16 mg/dL (ref 6–23)
Calcium, Ion: 0.98 mmol/L — ABNORMAL LOW (ref 1.12–1.32)
Chloride: 101 mEq/L (ref 96–112)
Creatinine, Ser: 2.1 mg/dL — ABNORMAL HIGH (ref 0.50–1.10)
Glucose, Bld: 133 mg/dL — ABNORMAL HIGH (ref 70–99)
HCT: 41 % (ref 36.0–46.0)
Hemoglobin: 13.9 g/dL (ref 12.0–15.0)
Potassium: 2.7 mEq/L — CL (ref 3.5–5.1)
Sodium: 134 meq/L — ABNORMAL LOW (ref 135–145)
TCO2: 17 mmol/L (ref 0–100)

## 2011-05-12 LAB — PROTIME-INR
INR: 1.05 (ref 0.00–1.49)
Prothrombin Time: 13.9 seconds (ref 11.6–15.2)

## 2011-05-12 LAB — COMPREHENSIVE METABOLIC PANEL
ALT: 18 U/L (ref 0–35)
AST: 34 U/L (ref 0–37)
Albumin: 4.9 g/dL (ref 3.5–5.2)
Alkaline Phosphatase: 88 U/L (ref 39–117)
Potassium: 2.6 mEq/L — CL (ref 3.5–5.1)
Sodium: 132 mEq/L — ABNORMAL LOW (ref 135–145)
Total Protein: 9 g/dL — ABNORMAL HIGH (ref 6.0–8.3)

## 2011-05-12 LAB — URINALYSIS, ROUTINE W REFLEX MICROSCOPIC
Bilirubin Urine: NEGATIVE
Ketones, ur: NEGATIVE mg/dL
Leukocytes, UA: NEGATIVE
Nitrite: NEGATIVE
Protein, ur: 30 mg/dL — AB

## 2011-05-12 LAB — CBC
MCV: 68.8 fL — ABNORMAL LOW (ref 78.0–100.0)
Platelets: 863 10*3/uL — ABNORMAL HIGH (ref 150–400)
RBC: 5 MIL/uL (ref 3.87–5.11)
WBC: 24.1 10*3/uL — ABNORMAL HIGH (ref 4.0–10.5)

## 2011-05-12 LAB — DIFFERENTIAL
Basophils Absolute: 0 10*3/uL (ref 0.0–0.1)
Lymphocytes Relative: 5 % — ABNORMAL LOW (ref 12–46)
Monocytes Relative: 4 % (ref 3–12)
Neutro Abs: 21.9 10*3/uL — ABNORMAL HIGH (ref 1.7–7.7)

## 2011-05-12 LAB — URINE MICROSCOPIC-ADD ON

## 2011-05-12 LAB — CK TOTAL AND CKMB (NOT AT ARMC): Relative Index: 1 (ref 0.0–2.5)

## 2011-05-12 LAB — TYPE AND SCREEN: ABO/RH(D): O POS

## 2011-05-12 LAB — AMYLASE: Amylase: 71 U/L (ref 0–105)

## 2011-05-12 LAB — TROPONIN I: Troponin I: <0.3 ng/mL (ref <0.30)

## 2011-05-12 LAB — LIPASE, BLOOD: Lipase: 20 U/L (ref 11–59)

## 2011-05-13 LAB — URINE CULTURE
Colony Count: 5000
Culture  Setup Time: 201206190229
Special Requests: NEGATIVE

## 2011-05-13 LAB — COMPREHENSIVE METABOLIC PANEL
CO2: 17 mEq/L — ABNORMAL LOW (ref 19–32)
Calcium: 9.6 mg/dL (ref 8.4–10.5)
Creatinine, Ser: 1.11 mg/dL — ABNORMAL HIGH (ref 0.50–1.10)
GFR calc Af Amer: >60 mL/min (ref >60)
GFR calc non Af Amer: 50 mL/min — ABNORMAL LOW (ref >60)
Glucose, Bld: 109 mg/dL — ABNORMAL HIGH (ref 70–99)

## 2011-05-13 LAB — MAGNESIUM: Magnesium: 1.7 mg/dL (ref 1.5–2.5)

## 2011-05-13 LAB — PHOSPHORUS: Phosphorus: 3.5 mg/dL (ref 2.3–4.6)

## 2011-05-13 LAB — HEMOGLOBIN A1C: Mean Plasma Glucose: 128 mg/dL — ABNORMAL HIGH (ref <117)

## 2011-05-13 LAB — LACTIC ACID, PLASMA: Lactic Acid, Venous: 1.4 mmol/L (ref 0.5–2.2)

## 2011-05-14 LAB — CBC
MCH: 20.1 pg — ABNORMAL LOW (ref 26.0–34.0)
MCV: 70.2 fL — ABNORMAL LOW (ref 78.0–100.0)
Platelets: 661 10*3/uL — ABNORMAL HIGH (ref 150–400)
RBC: 4.77 MIL/uL (ref 3.87–5.11)
RDW: 23.4 % — ABNORMAL HIGH (ref 11.5–15.5)

## 2011-05-14 LAB — TECHNOLOGIST SMEAR REVIEW

## 2011-05-14 LAB — BASIC METABOLIC PANEL
CO2: 23 mEq/L (ref 19–32)
Calcium: 10.3 mg/dL (ref 8.4–10.5)
Creatinine, Ser: 0.91 mg/dL (ref 0.50–1.10)

## 2011-05-15 LAB — CBC
HCT: 29.1 % — ABNORMAL LOW (ref 36.0–46.0)
Hemoglobin: 8.5 g/dL — ABNORMAL LOW (ref 12.0–15.0)
MCH: 20.5 pg — ABNORMAL LOW (ref 26.0–34.0)
MCHC: 29.2 g/dL — ABNORMAL LOW (ref 30.0–36.0)
MCV: 70.3 fL — ABNORMAL LOW (ref 78.0–100.0)
Platelets: 594 10*3/uL — ABNORMAL HIGH (ref 150–400)
RBC: 4.14 MIL/uL (ref 3.87–5.11)
RDW: 23 % — ABNORMAL HIGH (ref 11.5–15.5)
WBC: 12.6 10*3/uL — ABNORMAL HIGH (ref 4.0–10.5)

## 2011-05-15 LAB — BASIC METABOLIC PANEL
BUN: 9 mg/dL (ref 6–23)
Calcium: 9.4 mg/dL (ref 8.4–10.5)
Creatinine, Ser: 0.93 mg/dL (ref 0.50–1.10)
GFR calc Af Amer: >60 mL/min (ref >60)
GFR calc non Af Amer: >60 mL/min (ref >60)
Potassium: 3.5 mEq/L (ref 3.5–5.1)

## 2011-05-16 LAB — BASIC METABOLIC PANEL
Calcium: 9.6 mg/dL (ref 8.4–10.5)
Chloride: 107 mEq/L (ref 96–112)
Creatinine, Ser: 1.06 mg/dL (ref 0.50–1.10)
GFR calc Af Amer: >60 mL/min (ref >60)
GFR calc non Af Amer: 53 mL/min — ABNORMAL LOW (ref >60)

## 2011-05-20 NOTE — Discharge Summary (Signed)
NAMEMARENDA, [name] NO.:  [id number]  MEDICAL RECORD NO.:  [id number]  LOCATION:  1423                         FACILITY:  [facility]  PHYSICIAN:  [name], M.D.   DATE OF BIRTH:  05-24-1951  DATE OF ADMISSION:  05/12/2011 DATE OF DISCHARGE:  05/17/2011                              DISCHARGE SUMMARY   PRIMARY CARE PHYSICIAN:  Dr. Andrey [name] at [facility].  DISCHARGE DIAGNOSES: 1. Paroxysmal atrial fibrillation. 2. Hypertension. 3. Intractable nausea and vomiting. 4. Gastroparesis. 5. Hypokalemia. 6. Acute renal failure. 7. Chronic normocytic anemia. 8. Chronic leukocytosis. 9. Chronic diastolic congestive heart failure.  DISCHARGE MEDICATIONS: 1. Ativan 1 mg p.o. q.8 h p.r.n. nerves. 2. Zofran 4 mg p.o. q.6 h p.r.n. nausea. 3. Reglan 5 mg p.o. q.a.c. h.s. 4. Amiodarone 200 mg p.o. daily/ 5. Aspirin 325 mg p.o. daily. 6. Citalopram 20 mg p.o. daily. 7. Furosemide 40 mg p.o. daily. 8. Laxative/stool softener 5 mg tabs 2 tablets p.o. daily. 9. Lisinopril 10 mg p.o. daily. 10.Loratadine 10 mg p.o. daily. 11.Metoprolol tartrate 25 mg p.o. b.i.d. 12.Oyster Shell Calcium 500/125 mg tablets 1 p.o. daily. 13.Potassium chloride 20 mEq p.o. daily. 14.Protonix 40 mg p.o. daily. 15.Temazepam 15 mg p.o. q.h.s. p.r.n. sleep. 16.Tramadol 50 mg p.o. b.i.d. p.r.n. pain. 17.Tylenol 650 mg p.o. daily p.r.n. pain. 18.Vytorin 10/20 one tablet p.o. q.h.s.  Note:  The patient's ferrous sulfate has been discontinued due to the concern that it was contributing to her symptoms.  CONSULTATIONS:  Physical and occupational therapy.  BRIEF ADMISSION HPI:  The patient is a 60 year old female who presented to the hospital with 1-week history of intractable nausea, vomiting, and abdominal pain.  Pain and vomiting seemed to have a temporary relationship to having started an iron supplement.  While evaluated in the ER, the patient was noted to have a potassium of 2.6 and  therefore was referred to the hospitalist service for further evaluation and treatment.  For full details, please see the dictated report done by Dr. Blake [name].  PROCEDURES AND DIAGNOSTIC STUDIES: 1. Chest x-ray on May 12, 2011, showed no active disease in 1 view. 2. CT scan of the abdomen and pelvis on May 12, 2011 showed no acute     findings within the abdomen or pelvis.  DISCHARGE LABORATORY VALUES:  Sodium was 136, potassium 4.9, chloride 107, bicarb 19, BUN 8, creatinine 1.06, glucose 87, calcium 9.6.  White blood cell count was 12.6, hemoglobin 8.5, hematocrit 29.1, platelets 594.  Urine cultures were negative.  Hemoglobin A1c was 6.1%.  Liver function studies done on admission were completely within normal limits.  HOSPITAL COURSE BY PROBLEM: 1. Intractable nausea and vomiting:  This was felt to be     multifactorial.  The patient has a history of gastroparesis and her     symptoms seem to the temporarily related to beginning an iron     supplement.  Iron supplement was discontinued and she was put on     proton pump inhibitor therapy and bowel rest.  A review of her     records indicated that she had an upper endoscopy in February 2012     which only showed gastritis and a colonoscopy as well  in February     which only showed internal and external hemorrhoids.  The patient     had a CT scan done on admission which was completely normal.  She     had an abdominal ultrasound back in March which was also normal.     The patient was put on IV Reglan, supportive care, bowel rest, IV     fluids, antiemetics and had gradual resolution of her symptoms.     She was able to tolerate advancement of her diet and this has been     completely advanced and tolerated for the past 48 hours.  This     point, the patient is felt to be stable for discharge and she has     been instructed to discontinue her iron supplement. 2. Paroxysmal atrial fibrillation/rapid ventricular response:  The      patient was initially put on IV Cardizem and she ultimately     converted to normal sinus rhythm.  She is maintained on amiodarone     and a beta blocker.  Her rate has been well controlled and she has     been maintaining normal sinus rhythm for the past several days.     She is not felt to be a Coumadin candidate and has refused to go on     Coumadin secondary to history of GI bleeding on 3 separate     episodes.  She has been evaluated by [facility] and     Vascular Cardiology in the past and has been recommended to     continue aspirin.  She will be discharged on a combination of     aspirin, amiodarone and beta-blocker. 3. Hypokalemia:  The patient's potassium was fully repleted prior to     discharge. 4. Acute renal failure:  The patient's presenting creatinine was much     higher than her baseline creatinine.  Creatinine was 2.10 on     presentation and with IV fluid hydration gradually has come down to     her baseline values. 5. Chronic normocytic anemia:  The patient's hemoglobin and hematocrit     are low but stable.  At this point, I do not recommend further     therapy with iron.  If she continues to be iron deficient,     consideration for IV iron infusion can be made by her outpatient     treating physicians. 6. Chronic leukocytosis:  This apparently dates back to 2010 and the     etiology is not entirely clear.  There is no evidence of an     infectious etiology.  Chest x-ray was clear, urinalysis and     cultures were negative, and a CT scan of abdomen and pelvis were     likewise unrevealing.  This dates back to 2010.  She likely has a     bone marrow issue and do recommend outpatient hematology followup     for consideration of bone marrow biopsy if felt to be indicated by     her primary care physician. 7. Chronic diastolic congestive heart failure:  The patient has     remained well compensated throughout her hospital stay.  She has     been given IV fluids  throughout her hospital stay and has had a     mild increase in weight.  She is asymptomatic with regard to     respiratory standpoint but we will resume her Lasix at discharge.  DISPOSITION:  The patient is medically stable and will be discharged home.  CONDITION ON DISCHARGE:  Improved.  DISCHARGE INSTRUCTIONS:  ACTIVITY:  No restrictions.  DIET:  Low-sodium, heart-healthy, small frequent meals.  FOLLOWUP:  With Dr. Andrey [name] on June 06, 2011, at 10:45 a.m.  Time spent coordinating care for discharge and discharge instructions including face-to-face time equals approximately 35 minutes.     [name], M.D.     CR/MEDQ  D:  05/17/2011  T:  05/17/2011  Job:  [id number]  cc:   Dr. Andrey [name] at [facility]  Electronically Signed by [name] M.D. on 05/20/2011 06:03:07 PM

## 2011-05-21 NOTE — H&P (Signed)
[name], Joy NO.:  [id number]  MEDICAL RECORD NO.:  [id number]  LOCATION:  WLED                         FACILITY:  [facility]  PHYSICIAN:  Kathlen Mody, MD       DATE OF BIRTH:  Sep 15, 1951  DATE OF ADMISSION:  05/12/2011 DATE OF DISCHARGE:                             HISTORY & PHYSICAL   PRIMARY CARE PHYSICIAN:  [facility] at [facility].  CHIEF COMPLAINT:  Nausea, vomiting, abdominal pain since 1 week.  HISTORY OF PRESENT ILLNESS:  This is a 60 year old lady with a history of atrial fibrillation, not on Coumadin; chronic diastolic heart failure; schizophrenia; hypertension; hyperlipidemia; iron-deficiency anemia; who has multiple admissions in the past with similar complaints, comes back in for nausea, vomiting and abdominal pain going on for about a week.  The patient does not report any fever.  She has occasional chills.  Nausea, vomiting and abdominal pain started after she started taking iron supplement that was prescribed by the [facility].  No hematemesis.  She has not had a bowel movement in over 3 days mostly from decreased p.o. intake.  Her abdominal pain started in the lower quadrant at first, now it is more in the epigastrium.  The patient denies any chest pain, shortness of breath, palpitations, syncope, cough.  She denies any headache or blurry vision.  Denies any tingling or numbness anywhere in her body.  While in the ER, she was found to have a potassium of 2.6.  She also reports to generalized weakness.  She is being given iron supplementation and fluids.  REVIEW OF SYSTEMS:  See HPI, otherwise negative.  PAST MEDICAL HISTORY: 1. Hypertension. 2. Hyperlipidemia. 3. Atrial fibrillation, not on Coumadin secondary to GI bleed. 4. Chronic diastolic heart failure. 5. Iron-deficiency anemia. 6. Schizophrenia. 7. Tobacco abuse. 8. History of GI bleed secondary to hemorrhoids.  HOME MEDICATIONS: 1. Temazepam 50 mg 1 tablet daily. 2. Ferrous sulfate  325 mg 1 tablet three times a day. 3. Celexa 20 mg 1 tablet daily. 4. Amiodarone 200 mg daily. 5. Loratadine 10 mg daily. 6. Tylenol 325 mg 2 tablets daily as needed. 7. Oyster Calcium 500/125 one tablet daily. 8. Lisinopril 10 mg daily. 9. Protonix 40 mg daily. 10.Laxative 5 mg 2 tablets daily. 11.Metoprolol 25 mg 1 tablet twice daily. 12.Tramadol 50 mg twice daily. 13.Aspirin 325 mg daily. 14.Vytorin 10/20 mg 1 tablet daily at bedtime. 15.Potassium chloride 20 mEq 1 tablet daily. 16.Lasix 40 mg p.o. 1 tablet daily.  SOCIAL HISTORY:  She lives by herself, still smokes cigarettes.  Denies EtOH or IV drug abuse.  FAMILY HISTORY:  No significant family history.  ALLERGIES:  PENICILLIN.  PHYSICAL EXAMINATION:  VITAL SIGNS ON ADMISSION:  Temperature of 97.2, pulse ranging from 98 to 110, respiratory rate 22, saturating 100% on 2 L nasal cannula.  Blood pressure was ranging from 150/86 to 160/90. GENERAL:  She is alert, afebrile, oriented x3, in mild distress. HEENT:  Pupils equal and reacting to light.  Dry mucous membranes.  No JVD. CARDIOVASCULAR:  S1, S2 heard, irregular, regular rhythm. RESPIRATORY:  Chest clear to auscultation bilaterally. ABDOMEN:  Soft.  Epigastric tenderness noted.  No rebound tenderness. Bowel sounds are heard.  Abdomen  is nondistended. EXTREMITIES:  No pedal edema. NEUROLOGIC:  No focal deficits.  PERTINENT LABORATORY AND X-RAY DATA:  EKG atrial fibrillation.  The patient had Chem-8 significant for sodium of 132, potassium of 2.7, glucose 133, BUN 16, creatinine 2.14.  INR was 1.  Comprehensive metabolic panel significant once again for potassium of 2.6, creatinine 1.87.  CK-MB 7, total creatinine kinase 687, normal troponin.  CBC significant for WBC count of 24.1, hemoglobin of 10.3, hematocrit of 34.4, platelets of 863.  Urinalysis shows negative nitrites and no leukocytes.  Urine microscopy shows hyaline casts, no bacteria.  She had a chest  x-ray, which shows no active disease.  ASSESSMENT AND PLAN:  This is a 60 year old lady with history of atrial fibrillation, hypertension, hyperlipidemia, chronic diastolic heart failure, iron-deficiency anemia with a history of multiple hospitalizations for nausea, vomiting and abdominal pain who comes back to [facility] ED for similar complaints of nausea, vomiting, and abdominal pain.  Now she reports these symptoms have started after she took the iron supplements.  While in the ED, her heart rate has been between 98 to 110.  She is in atrial fibrillation with rapid ventricular response.  The patient was started on Cardizem drip, heart rate is between 90 to 100 at this time.  She will be admitted to telemetry for atrial fibrillation with rapid ventricular response.  She is on a Cardizem drip, will continue the same to control her heart rate.  Will get cardiac enzymes q.6 h x3, get an EKG in the morning.  Nausea, vomiting, and abdominal pain:  She has recent admissions for similar complaints and a Gastroenterology consult was called and suggested that her symptoms could be secondary to gastroparesis and she was started on Reglan t.i.d. a.c.  At this time, we will keep her on clear liquid diet if she tolerates it, give her IV fluids, normal saline at 100 mL/hour, put her on IV Reglan t.i.d. a.c. and put her on IV Protonix 40 mg daily for possible gastritis.  Will get a CT abdomen and pelvis without contrast as her creatinine is 1.8 today.  Will hold her iron supplementation until she cannot tolerate a regular diet.  The patient is afebrile.  She has a leukocytosis of 24,000.  She has this kind of leukocytosis on previous admission.  Her cultures have been negative and we could not find the source of infection.  I think this leukocytosis is mostly a combination of reactive and dehydration.  I have a low threshold to start the antibiotics at this time.  We will await CT abdomen and  pelvis, and if abnormal or if showing infectious gastroenteritis, we will start her on antibiotics.  Acute renal failure:  The patient's creatinine was normal on discharge last month.  Her creatinine at this time is 1.8; I feel it is secondary to dehydration.  She is being hydrated with normal saline IV fluids 125 mL/hour.  Hypokalemia:  The patient's potassium is 2.6.  Even though she has not been eating over the last 1 week, she has been taking her Lasix and her potassium is being repleted at this time.  Iron deficiency anemia:  The patient has a history of GI bleed secondary to hemorrhoids.  She was on iron supplements.  We are holding the supplements for now because of nausea and vomiting.  We will restart them later on discharge.  Hypertension:  Blood pressure parameters are on the high side, most likely secondary to her symptoms.  Lisinopril is  being held for acute renal failure.  The patient will get metoprolol and amiodarone for her blood pressure.  Deep venous thrombosis prophylaxis:  Sequential compressive devices.  GI prophylaxis:  IV Protonix 40 mg daily until she can tolerate p.o.  The patient is full code.          ______________________________ Kathlen Mody, MD     VA/MEDQ  D:  05/12/2011  T:  05/12/2011  Job:  [id number]  Electronically Signed by Kathlen Mody MD on 05/21/2011 07:35:56 AM

## 2011-06-13 IMAGING — CR DG CHEST 1V PORT
1 series · 1 of 1 positions shown · non-contrast
Comparison: 1 day prior

CLINICAL DATA: PICC line placement.

PORTABLE CHEST - 1 VIEW

[AP]
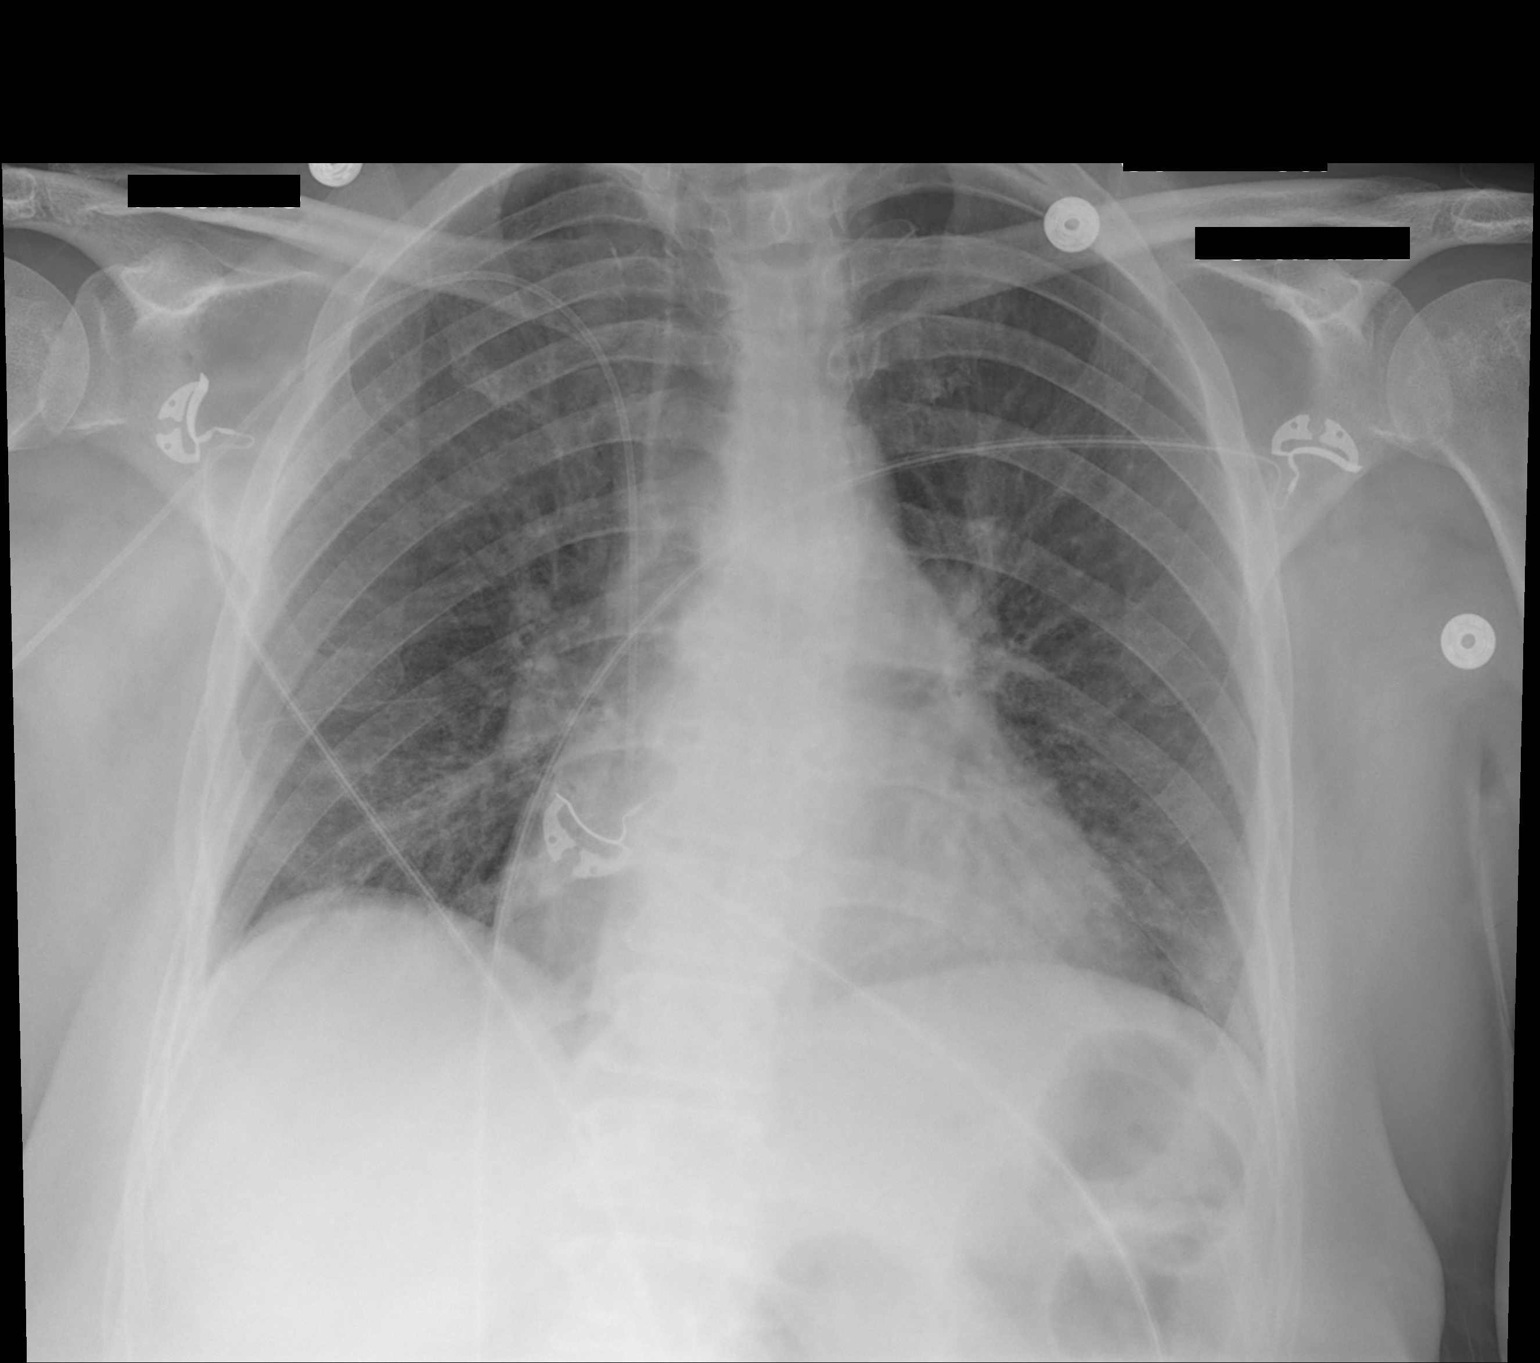

[1 of 1 positions shown; findings below may reference images not displayed]

FINDINGS: Right-sided PICC line terminates at the low SVC, just
above the cavoatrial junction. No pneumothorax.

Mild convex left thoracic spine curvature. Midline trachea.
Borderline cardiomegaly.  No pleural fluid.  Pulmonary interstitial
prominence is favored to be due to AP portable technique.
IMPRESSION: 1.  Right-sided PICC line appropriately positioned without
pneumothorax.
2.  Interstitial prominence, likely related to low lung volumes.
Mild pulmonary venous congestion cannot be excluded.

## 2011-07-08 ENCOUNTER — Encounter: Payer: Self-pay | Admitting: Internal Medicine

## 2011-07-15 ENCOUNTER — Inpatient Hospital Stay (HOSPITAL_COMMUNITY)
Admission: EM | Admit: 2011-07-15 | Discharge: 2011-07-20 | DRG: 309 | Disposition: A | Discharge status: Home / Self Care | Payer: Medicaid Other | Attending: Internal Medicine | Admitting: Internal Medicine

## 2011-07-15 ENCOUNTER — Emergency Department (HOSPITAL_COMMUNITY): Payer: Medicaid Other

## 2011-07-15 DIAGNOSIS — I4892 Unspecified atrial flutter: Secondary | ICD-10-CM | POA: Diagnosis present

## 2011-07-15 DIAGNOSIS — F1210 Cannabis abuse, uncomplicated: Secondary | ICD-10-CM | POA: Diagnosis present

## 2011-07-15 DIAGNOSIS — F209 Schizophrenia, unspecified: Secondary | ICD-10-CM | POA: Diagnosis present

## 2011-07-15 DIAGNOSIS — D72829 Elevated white blood cell count, unspecified: Secondary | ICD-10-CM | POA: Diagnosis present

## 2011-07-15 DIAGNOSIS — E876 Hypokalemia: Secondary | ICD-10-CM | POA: Diagnosis present

## 2011-07-15 DIAGNOSIS — I509 Heart failure, unspecified: Secondary | ICD-10-CM | POA: Diagnosis present

## 2011-07-15 DIAGNOSIS — E785 Hyperlipidemia, unspecified: Secondary | ICD-10-CM | POA: Diagnosis present

## 2011-07-15 DIAGNOSIS — I5032 Chronic diastolic (congestive) heart failure: Secondary | ICD-10-CM | POA: Diagnosis present

## 2011-07-15 DIAGNOSIS — I10 Essential (primary) hypertension: Secondary | ICD-10-CM | POA: Diagnosis present

## 2011-07-15 DIAGNOSIS — I4891 Unspecified atrial fibrillation: Principal | ICD-10-CM | POA: Diagnosis present

## 2011-07-15 DIAGNOSIS — R112 Nausea with vomiting, unspecified: Secondary | ICD-10-CM | POA: Diagnosis present

## 2011-07-15 DIAGNOSIS — D509 Iron deficiency anemia, unspecified: Secondary | ICD-10-CM | POA: Diagnosis present

## 2011-07-15 DIAGNOSIS — F17200 Nicotine dependence, unspecified, uncomplicated: Secondary | ICD-10-CM | POA: Diagnosis present

## 2011-07-15 LAB — CBC
HCT: 41.1 % (ref 36.0–46.0)
Hemoglobin: 13.3 g/dL (ref 12.0–15.0)
MCH: 24.4 pg — ABNORMAL LOW (ref 26.0–34.0)
MCHC: 32.4 g/dL (ref 30.0–36.0)
RBC: 5.45 MIL/uL — ABNORMAL HIGH (ref 3.87–5.11)

## 2011-07-15 LAB — DIFFERENTIAL
Basophils Absolute: 0 10*3/uL (ref 0.0–0.1)
Lymphocytes Relative: 6 % — ABNORMAL LOW (ref 12–46)
Monocytes Absolute: 0.3 10*3/uL (ref 0.1–1.0)
Monocytes Relative: 2 % — ABNORMAL LOW (ref 3–12)
Neutro Abs: 15.8 10*3/uL — ABNORMAL HIGH (ref 1.7–7.7)

## 2011-07-15 LAB — COMPREHENSIVE METABOLIC PANEL
Alkaline Phosphatase: 88 U/L (ref 39–117)
BUN: 19 mg/dL (ref 6–23)
CO2: 17 mEq/L — ABNORMAL LOW (ref 19–32)
GFR calc Af Amer: >60 mL/min (ref >60)
GFR calc non Af Amer: 55 mL/min — ABNORMAL LOW (ref >60)
Glucose, Bld: 162 mg/dL — ABNORMAL HIGH (ref 70–99)
Potassium: 3.6 mEq/L (ref 3.5–5.1)
Total Protein: 9.9 g/dL — ABNORMAL HIGH (ref 6.0–8.3)

## 2011-07-15 LAB — URINALYSIS, ROUTINE W REFLEX MICROSCOPIC
Bilirubin Urine: NEGATIVE
Glucose, UA: NEGATIVE mg/dL
Ketones, ur: NEGATIVE mg/dL
Protein, ur: 100 mg/dL — AB

## 2011-07-15 LAB — PROTIME-INR: Prothrombin Time: 13.8 seconds (ref 11.6–15.2)

## 2011-07-15 LAB — TROPONIN I: Troponin I: <0.3 ng/mL (ref <0.30)

## 2011-07-15 LAB — LIPASE, BLOOD: Lipase: 18 U/L (ref 11–59)

## 2011-07-16 LAB — MRSA PCR SCREENING: MRSA by PCR: NEGATIVE

## 2011-07-16 LAB — TSH: TSH: 0.882 u[IU]/mL (ref 0.350–4.500)

## 2011-07-17 ENCOUNTER — Inpatient Hospital Stay (HOSPITAL_COMMUNITY): Payer: Medicaid Other

## 2011-07-17 DIAGNOSIS — R633 Feeding difficulties: Secondary | ICD-10-CM

## 2011-07-17 DIAGNOSIS — R112 Nausea with vomiting, unspecified: Secondary | ICD-10-CM

## 2011-07-17 LAB — BASIC METABOLIC PANEL
Calcium: 10.2 mg/dL (ref 8.4–10.5)
GFR calc Af Amer: >60 mL/min (ref >60)
GFR calc non Af Amer: >60 mL/min (ref >60)
Glucose, Bld: 139 mg/dL — ABNORMAL HIGH (ref 70–99)
Potassium: 3.2 mEq/L — ABNORMAL LOW (ref 3.5–5.1)
Sodium: 132 mEq/L — ABNORMAL LOW (ref 135–145)

## 2011-07-17 LAB — CARDIAC PANEL(CRET KIN+CKTOT+MB+TROPI)
CK, MB: 2.5 ng/mL (ref 0.3–4.0)
Relative Index: INVALID (ref 0.0–2.5)
Troponin I: <0.3 ng/mL (ref <0.30)

## 2011-07-17 LAB — MAGNESIUM: Magnesium: 1.7 mg/dL (ref 1.5–2.5)

## 2011-07-17 LAB — POTASSIUM: Potassium: 2.9 mEq/L — ABNORMAL LOW (ref 3.5–5.1)

## 2011-07-17 LAB — D-DIMER, QUANTITATIVE: D-Dimer, Quant: 1.38 ug/mL-FEU — ABNORMAL HIGH (ref 0.00–0.48)

## 2011-07-17 MED ORDER — IOHEXOL 300 MG/ML  SOLN
80.0000 mL | Freq: Once | INTRAMUSCULAR | Status: AC | PRN
  Administered 2011-07-17: 80 mL via INTRAVENOUS

## 2011-07-18 ENCOUNTER — Inpatient Hospital Stay (HOSPITAL_COMMUNITY): Payer: Medicaid Other

## 2011-07-18 LAB — BASIC METABOLIC PANEL
BUN: 11 mg/dL (ref 6–23)
CO2: 24 mEq/L (ref 19–32)
Calcium: 10.2 mg/dL (ref 8.4–10.5)
Creatinine, Ser: 0.95 mg/dL (ref 0.50–1.10)
Creatinine, Ser: 0.95 mg/dL (ref 0.50–1.10)
GFR calc Af Amer: >60 mL/min (ref >60)
GFR calc non Af Amer: 60 mL/min — ABNORMAL LOW (ref >60)
Glucose, Bld: 120 mg/dL — ABNORMAL HIGH (ref 70–99)
Potassium: 2.7 mEq/L — CL (ref 3.5–5.1)

## 2011-07-18 LAB — CBC
MCV: 74.5 fL — ABNORMAL LOW (ref 78.0–100.0)
Platelets: 320 10*3/uL (ref 150–400)
RDW: 19 % — ABNORMAL HIGH (ref 11.5–15.5)
WBC: 16.1 10*3/uL — ABNORMAL HIGH (ref 4.0–10.5)

## 2011-07-18 LAB — HEPARIN LEVEL (UNFRACTIONATED)
Heparin Unfractionated: 0.62 IU/mL (ref 0.30–0.70)
Heparin Unfractionated: 0.7 IU/mL (ref 0.30–0.70)
Heparin Unfractionated: 0.71 IU/mL — ABNORMAL HIGH (ref 0.30–0.70)

## 2011-07-18 LAB — DIFFERENTIAL
Basophils Relative: 0 % (ref 0–1)
Eosinophils Absolute: 0 10*3/uL (ref 0.0–0.7)
Eosinophils Relative: 0 % (ref 0–5)
Lymphs Abs: 1.6 10*3/uL (ref 0.7–4.0)
Neutrophils Relative %: 81 % — ABNORMAL HIGH (ref 43–77)

## 2011-07-19 LAB — BASIC METABOLIC PANEL
BUN: 12 mg/dL (ref 6–23)
CO2: 23 mEq/L (ref 19–32)
Calcium: 9.6 mg/dL (ref 8.4–10.5)
Creatinine, Ser: 0.9 mg/dL (ref 0.50–1.10)
Glucose, Bld: 105 mg/dL — ABNORMAL HIGH (ref 70–99)

## 2011-07-19 LAB — CBC
HCT: 41 % (ref 36.0–46.0)
MCH: 24.6 pg — ABNORMAL LOW (ref 26.0–34.0)
MCHC: 32.7 g/dL (ref 30.0–36.0)
MCV: 75.2 fL — ABNORMAL LOW (ref 78.0–100.0)
RDW: 18.6 % — ABNORMAL HIGH (ref 11.5–15.5)

## 2011-07-20 ENCOUNTER — Inpatient Hospital Stay (HOSPITAL_COMMUNITY): Payer: Medicaid Other

## 2011-07-20 LAB — CBC
HCT: 36.4 % (ref 36.0–46.0)
MCHC: 32.1 g/dL (ref 30.0–36.0)
MCV: 76.5 fL — ABNORMAL LOW (ref 78.0–100.0)
RDW: 18.6 % — ABNORMAL HIGH (ref 11.5–15.5)
WBC: 10.7 10*3/uL — ABNORMAL HIGH (ref 4.0–10.5)

## 2011-07-20 LAB — URINALYSIS, ROUTINE W REFLEX MICROSCOPIC
Glucose, UA: NEGATIVE mg/dL
Hgb urine dipstick: NEGATIVE
Ketones, ur: NEGATIVE mg/dL
Protein, ur: NEGATIVE mg/dL

## 2011-07-20 LAB — COMPREHENSIVE METABOLIC PANEL
Albumin: 2.9 g/dL — ABNORMAL LOW (ref 3.5–5.2)
BUN: 18 mg/dL (ref 6–23)
Chloride: 106 mEq/L (ref 96–112)
Creatinine, Ser: 1.24 mg/dL — ABNORMAL HIGH (ref 0.50–1.10)
GFR calc Af Amer: 53 mL/min — ABNORMAL LOW (ref >60)
Total Bilirubin: 0.2 mg/dL — ABNORMAL LOW (ref 0.3–1.2)

## 2011-07-21 LAB — URINE CULTURE: Special Requests: NEGATIVE

## 2011-08-04 NOTE — Discharge Summary (Signed)
[name], OSTERLOH NO.:  [id number]  MEDICAL RECORD NO.:  [id number]  LOCATION:  1403                         FACILITY:  [facility]  PHYSICIAN:  Richarda Overlie, MD       DATE OF BIRTH:  1951-01-01  DATE OF ADMISSION:  07/15/2011 DATE OF DISCHARGE:  07/19/2011                              DISCHARGE SUMMARY   PRIMARY CARE PHYSICIAN:  [facility].  PRIMARY CARDIOLOGIST:  Lyn Records, M.D.  DISCHARGE DIAGNOSES: 1. Atrial fibrillation with a 1:1 conduction. 2. Intractable nausea and vomiting of unclear etiology. 3. Recent history of gastrointestinal bleed, therefore, not a     candidate for anticoagulation. 4. Hypertension. 5. Dyslipidemia. 6. Chronic diastolic heart failure. 7. Iron-deficiency anemia. 8. Schizophrenia. 9. History of tobacco use.  DISCHARGE MEDICATIONS: 1. Aspirin 325 mg p.o. daily. 2. Metoclopramide 5 mg 3 times a day. 3. Amiodarone 200 mg in the morning. 4. Ativan 1 mg q.8 h. p.r.n. 5. Calcium carbonate 1 tablet p.o. daily. 6. Celexa 1 tablet in the morning. 7. Colace 200 mg p.o. daily. 8. Lasix 40 mg p.o. daily. 9. Ferrous sulfate 100 mg p.o. daily. 10.Lisinopril 10 mg p.o. daily. 11.Loratadine 10 mg p.o. daily. 12.Metoprolol 25 mg p.o. twice daily. 13.K-Dur 20 mEq p.o. daily. 14.Protonix 40 mg p.o. daily. 15.Temazepam 15 mg p.o. daily. 16.Tramadol 50 mg p.o. twice daily as needed. 17.Extra strength Tylenol 1000 mg p.o. q.6 h. p.r.n. 18.Zofran 1 mg p.o. q.6 h. as needed.  SUBJECTIVE:  [name] with a history of GI bleed, paroxysmal atrial flutter, hypertension, schizophrenia, tobacco abuse, diastolic dysfunction who presents to the ED with a chief complaint of intractable nausea, vomiting, and chest pain because she has not been able to keep her p.o. medications down.  The patient has also experienced atrial fibrillation with rapid ventricular response.  She also complained of epigastric pain 7/10 associated  with some occasional headache.  HOSPITAL COURSE: 1. Atrial fibrillation with rapid ventricular response thought to be     secondary to inability to keep her p.o. medications down and also     exacerbated by the nausea and vomiting.  She had a CT angio of the     chest that did not show any obvious PE.  She had a chest x-ray that     did not show any obvious pneumonia.  The patient was started on an     amiodarone drip.  Subsequently, she was converted to normal sinus     rhythm on the 25th.  [facility] was consulted     because of difficulty with rate control and the patient was thought     to be a candidate for transesophageal echo as well as cardioversion     if the patient remained in atrial fibrillation.  However, the     patient converted before that.  The patient was also found to have     atrial fibrillation in the setting of her hypokalemia.  Her     potassium was repleted during this admission.  Currently, the     patient is in normal sinus rhythm.  [facility]     has resumed her  amiodarone and metoprolol.  It is felt that the     patient may be a candidate for ablation in the future.  The patient     was not a candidate for anticoagulation as a risk of bleeding     outweighs the benefit per Cardiology.  She will be maintained on a     full-dose aspirin at 325 mg p.o. daily. 2. Intractable nausea and vomiting.  [facility] was consulted.  No     obvious cause of the patient's intractable nausea and vomiting has     been found thus far.  The patient has had extensive workup     including an EGD in February 2012 and a colonoscopy.  The patient's     hemoglobin was found to be at baseline.  It was recommended to     continue the patient on Reglan before meals 3 times a day and a     gastric emptying study was attempted, but not completed during this     admission because of the patient's nausea.  This may be reattempted     in the  future.  PHYSICAL EXAMINATION PRIOR TO DISCHARGE:  VITAL SIGNS:  Temperature 99.0, pulses 68, respirations 18, blood pressure 114/70, 99% on room air. GENERAL:  Comfortable currently, in no acute cardiopulmonary distress. HEENT:  Pupils equal and reactive.  Extraocular movements intact. LUNGS:  Clear to auscultation bilaterally.  No wheezes.  No crackles or rhonchi. CARDIOVASCULAR:  Regular rate and rhythm.  No murmurs, rubs, or gallops. ABDOMEN:  Soft, nontender, nondistended. EXTREMITIES:  Without cyanosis, clubbing, or edema. NEUROLOGIC:  Cranial nerves II through XII grossly intact.  DISCHARGE INSTRUCTIONS: 1. Follow up with PCP in 5-7 days. 2. Follow up with Dr. Verdis Prime in 1 week.     Richarda Overlie, MD     NA/MEDQ  D:  07/20/2011  T:  07/20/2011  Job:  [id number]  cc:   Park Meo, M.D. Fax: [phone]  Electronically Signed by Richarda Overlie MD on 08/04/2011 07:21:35 AM

## 2011-11-11 ENCOUNTER — Other Ambulatory Visit (HOSPITAL_COMMUNITY): Payer: Self-pay | Admitting: Family Medicine

## 2011-11-11 DIAGNOSIS — Z1231 Encounter for screening mammogram for malignant neoplasm of breast: Secondary | ICD-10-CM

## 2011-12-13 ENCOUNTER — Other Ambulatory Visit: Payer: Self-pay

## 2011-12-13 ENCOUNTER — Emergency Department (HOSPITAL_COMMUNITY)
Admission: EM | Admit: 2011-12-13 | Discharge: 2011-12-14 | Disposition: A | Discharge status: Home / Self Care | Payer: Medicaid Other | Attending: Emergency Medicine | Admitting: Emergency Medicine

## 2011-12-13 ENCOUNTER — Other Ambulatory Visit (HOSPITAL_COMMUNITY): Payer: Self-pay

## 2011-12-13 ENCOUNTER — Encounter (HOSPITAL_COMMUNITY): Payer: Self-pay | Admitting: *Deleted

## 2011-12-13 ENCOUNTER — Emergency Department (HOSPITAL_COMMUNITY): Payer: Medicaid Other

## 2011-12-13 DIAGNOSIS — E119 Type 2 diabetes mellitus without complications: Secondary | ICD-10-CM | POA: Insufficient documentation

## 2011-12-13 DIAGNOSIS — I169 Hypertensive crisis, unspecified: Secondary | ICD-10-CM

## 2011-12-13 DIAGNOSIS — F17200 Nicotine dependence, unspecified, uncomplicated: Secondary | ICD-10-CM | POA: Insufficient documentation

## 2011-12-13 DIAGNOSIS — Z79899 Other long term (current) drug therapy: Secondary | ICD-10-CM | POA: Insufficient documentation

## 2011-12-13 DIAGNOSIS — Z7982 Long term (current) use of aspirin: Secondary | ICD-10-CM | POA: Insufficient documentation

## 2011-12-13 DIAGNOSIS — R079 Chest pain, unspecified: Secondary | ICD-10-CM | POA: Insufficient documentation

## 2011-12-13 HISTORY — DX: Prediabetes: R73.03

## 2011-12-13 HISTORY — DX: Essential (primary) hypertension: I10

## 2011-12-13 LAB — POCT I-STAT TROPONIN I: Troponin i, poc: 0 ng/mL (ref 0.00–0.08)

## 2011-12-13 LAB — CBC
HCT: 32.8 % — ABNORMAL LOW (ref 36.0–46.0)
Hemoglobin: 10.4 g/dL — ABNORMAL LOW (ref 12.0–15.0)
MCH: 22.6 pg — ABNORMAL LOW (ref 26.0–34.0)
MCHC: 31.7 g/dL (ref 30.0–36.0)
MCV: 71.1 fL — ABNORMAL LOW (ref 78.0–100.0)

## 2011-12-13 LAB — COMPREHENSIVE METABOLIC PANEL
ALT: 12 U/L (ref 0–35)
AST: 18 U/L (ref 0–37)
Alkaline Phosphatase: 77 U/L (ref 39–117)
BUN: 15 mg/dL (ref 6–23)
CO2: 23 mEq/L (ref 19–32)
Chloride: 100 mEq/L (ref 96–112)
Creatinine, Ser: 1.37 mg/dL — ABNORMAL HIGH (ref 0.50–1.10)
Glucose, Bld: 107 mg/dL — ABNORMAL HIGH (ref 70–99)

## 2011-12-13 LAB — PROTIME-INR: INR: 0.92 (ref 0.00–1.49)

## 2011-12-13 MED ORDER — LABETALOL HCL 5 MG/ML IV SOLN: 20.0000 mg | INTRAVENOUS | Status: DC | PRN

## 2011-12-13 MED ORDER — LORAZEPAM 2 MG/ML IJ SOLN
INTRAMUSCULAR | Status: AC
  Administered 2011-12-13: 1 mg via INTRAVENOUS
  Filled 2011-12-13: qty 1

## 2011-12-13 MED ORDER — ONDANSETRON HCL 4 MG/2ML IJ SOLN
INTRAMUSCULAR | Status: AC
  Filled 2011-12-13: qty 2

## 2011-12-13 MED ORDER — LORAZEPAM 2 MG/ML IJ SOLN: 1.0000 mg | Freq: Once | INTRAMUSCULAR | Status: DC

## 2011-12-13 MED ORDER — ONDANSETRON HCL 4 MG/2ML IJ SOLN
INTRAMUSCULAR | Status: AC
  Administered 2011-12-13: 19:00:00
  Filled 2011-12-13: qty 2

## 2011-12-13 MED ORDER — ASPIRIN 300 MG RE SUPP
RECTAL | Status: AC
  Administered 2011-12-13: 19:00:00
  Filled 2011-12-13: qty 1

## 2011-12-13 MED ORDER — LORAZEPAM 2 MG/ML IJ SOLN
1.0000 mg | Freq: Once | INTRAMUSCULAR | Status: AC
  Administered 2011-12-13: 1 mg via INTRAVENOUS

## 2011-12-13 MED ORDER — NITROGLYCERIN 0.4 MG SL SUBL
SUBLINGUAL_TABLET | SUBLINGUAL | Status: AC
  Filled 2011-12-13: qty 25

## 2011-12-13 MED ORDER — ONDANSETRON 8 MG/NS 50 ML IVPB
8.0000 mg | Freq: Once | INTRAVENOUS | Status: AC
  Administered 2011-12-13: 8 mg via INTRAVENOUS
  Filled 2011-12-13: qty 8

## 2011-12-13 MED ORDER — METOPROLOL TARTRATE 25 MG PO TABS: 50.0000 mg | ORAL_TABLET | Freq: Two times a day (BID) | ORAL | Status: DC

## 2011-12-13 MED ORDER — ASPIRIN 81 MG PO CHEW
CHEWABLE_TABLET | ORAL | Status: AC
  Filled 2011-12-13: qty 4

## 2011-12-13 MED ORDER — LORAZEPAM 1 MG PO TABS: 1.0000 mg | ORAL_TABLET | Freq: Three times a day (TID) | ORAL | Status: AC | PRN

## 2011-12-13 MED ORDER — LABETALOL HCL 5 MG/ML IV SOLN
20.0000 mg | Freq: Once | INTRAVENOUS | Status: AC
  Administered 2011-12-13: 20 mg via INTRAVENOUS
  Filled 2011-12-13: qty 4

## 2011-12-13 NOTE — ED Notes (Signed)
Pt is diaphoretic, BP 220/114, c/o chest pain, nausea and vomiting. EDP notified

## 2011-12-13 NOTE — ED Notes (Signed)
MD at bedside. 

## 2011-12-13 NOTE — ED Notes (Signed)
Pt sts n/v and chest pain beginning at approximately 1pm today.

## 2011-12-13 NOTE — ED Notes (Signed)
Rx given to pt. Pt understands to follow-up

## 2011-12-13 NOTE — ED Notes (Addendum)
Rn in room 

## 2011-12-13 NOTE — ED Provider Notes (Signed)
History     CSN: [id number]  Arrival date & time 12/13/11  4098   First MD Initiated Contact with Patient 12/13/11 1752      Chief Complaint  Patient presents with  . Chest Pain    (Consider location/radiation/quality/duration/timing/severity/associated sxs/prior treatment) HPI Patient presents with chest pain, nausea, vomiting.  Her symptoms began approximately 5 hours prior to presentation, though according to the patient's daughter.  She has had similar complaints for 2 or 3 weeks, but they worsened today.  The pain is described as sternal, nonradiating, sharp, no attempt at analgesia.  Thus far.  Pain seems to the worse with activity.   Past Medical History  Diagnosis Date  . Hypertension   . Borderline diabetes     Past Surgical History  Procedure Date  . Hemmoroid     No family history on file.  History  Substance Use Topics  . Smoking status: Current Everyday Smoker    Types: Cigarettes  . Smokeless tobacco: Current User  . Alcohol Use:     OB History    Grav Para Term Preterm Abortions TAB SAB Ect Mult Living                  Review of Systems  Constitutional:       HPI  HENT:       HPI otherwise negative  Eyes: Negative.   Respiratory:       HPI, otherwise negative  Cardiovascular:       HPI, otherwise nmegative  Gastrointestinal: Negative for vomiting.  Genitourinary:       HPI, otherwise negative  Musculoskeletal:       HPI, otherwise negative  Skin: Negative.   Neurological: Negative for syncope.    Allergies  Penicillins  Home Medications   Current Outpatient Rx  Name Route Sig Dispense Refill  . AMIODARONE HCL 200 MG PO TABS Oral Take 200 mg by mouth daily.    . ASPIRIN 325 MG PO TBEC Oral Take 325 mg by mouth daily.    Marland Kitchen CALCIUM CARBONATE 600 MG PO TABS Oral Take 600 mg by mouth 2 (two) times daily with a meal.    . FERROUS FUMARATE ER 50 MG PO TBCR Oral Take 50 mg by mouth 3 (three) times daily with meals.    . FUROSEMIDE 40  MG PO TABS Oral Take 40 mg by mouth daily.    Marland Kitchen LISINOPRIL 10 MG PO TABS Oral Take 10 mg by mouth daily.    . MELOXICAM 7.5 MG PO TABS Oral Take 7.5 mg by mouth daily.    Marland Kitchen METOCLOPRAMIDE HCL 5 MG PO TBDP Oral Take by mouth.    . METOPROLOL TARTRATE 25 MG PO TABS Oral Take 25 mg by mouth 2 (two) times daily.    Marland Kitchen POTASSIUM CHLORIDE CRYS ER 20 MEQ PO TBCR Oral Take 20 mEq by mouth 2 (two) times daily.    . TRAMADOL HCL 50 MG PO TABS Oral Take 50 mg by mouth every 6 (six) hours as needed. For pain relief      BP 173/92  Pulse 67  Resp 20  Ht 5\' 7"  (1.702 m)  SpO2 100%  Physical Exam  Constitutional: She is oriented to person, place, and time. She appears well-developed. She appears distressed.  HENT:  Head: Normocephalic and atraumatic.  Eyes: Conjunctivae and EOM are normal. Pupils are equal, round, and reactive to light.  Cardiovascular: Normal rate and regular rhythm.   Pulmonary/Chest: Effort normal. No  stridor. No respiratory distress.  Abdominal: Soft. She exhibits no distension.  Musculoskeletal: She exhibits no edema.  Neurological: She is alert and oriented to person, place, and time. No cranial nerve deficit. She exhibits normal muscle tone. Coordination normal.  Skin: Skin is warm. She is diaphoretic.  Psychiatric: She has a normal mood and affect.    ED Course  Procedures (including critical care time)  Labs Reviewed  COMPREHENSIVE METABOLIC PANEL - Abnormal; Notable for the following:    Glucose, Bld 107 (*)    Creatinine, Ser 1.37 (*)    Total Bilirubin 0.2 (*)    GFR calc non Af Amer 41 (*)    GFR calc Af Amer 47 (*)    All other components within normal limits  CBC - Abnormal; Notable for the following:    WBC 15.0 (*) WHITE COUNT CONFIRMED ON SMEAR   Hemoglobin 10.4 (*)    HCT 32.8 (*)    MCV 71.1 (*)    MCH 22.6 (*)    RDW 18.6 (*)    All other components within normal limits  PROTIME-INR  TROPONIN I  TROPONIN I  I-STAT TROPONIN I   No results  found.   No diagnosis found.   Date: 12/13/2011  Rate: 65  Rhythm: normal sinus rhythm  QRS Axis: left  Intervals: normal  ST/T Wave abnormalities: nonspecific T wave changes  Conduction Disutrbances:nonspecific intraventricular conduction delay  Narrative Interpretation:   Old EKG Reviewed: changes noted ABNORMAL ECG  Cardiac monitor: 71 sr- normal Pulse ox 97% ra - normal  CXR: cardiomegaly, reviewed by me   MDM  This 61 year old female sounds with several weeks of complaints, chest pain, dyspnea, nausea, that all worsened today.  On exam, the patient is diaphoretic, uncomfortable appearing, with blood pressure notable for systolic greater than 220.  The patient's initial ECGs reassuring, and her labs do not demonstrate end organ effects of sustained hypertension.  The patient's blood pressure improved following IV pushes of beta blocker.  When the patient was more stable.  She reported that she was inconsistently taking her blood pressure medications.  On repeat evaluation.  The patient was in no distress, with no complaints.  Given the absence of acute evidence of sustained hypertension, the patient's resolution of symptoms, her noted.  Occasional complaints, and the resolution of her blood pressure issue.  She was discharged with explicit instructions to resume her medications and to follow up with her primary care physician.  CRITICAL CARE Performed by: Gerhard Munch   Total critical care time: 35  Critical care time was exclusive of separately billable procedures and treating other patients.  Critical care was necessary to treat or prevent imminent or life-threatening deterioration.  Critical care was time spent personally by me on the following activities: development of treatment plan with patient and/or surrogate as well as nursing, discussions with consultants, evaluation of patient's response to treatment, examination of patient, obtaining history from patient or  surrogate, ordering and performing treatments and interventions, ordering and review of laboratory studies, ordering and review of radiographic studies, pulse oximetry and re-evaluation of patient's condition.         Gerhard Munch, MD 12/13/11 2321

## 2011-12-13 NOTE — ED Notes (Signed)
Pharmacy called to tube up the labetalol

## 2011-12-13 NOTE — ED Notes (Addendum)
Dr. Jeraldine Loots at bedside. Attempting to obtain IV access at this moment. EKG obtained

## 2011-12-13 NOTE — ED Notes (Signed)
Pt sts that she began having N/V this afternoon approx. 1:00PM. Pt sts that she was not doing anything when she started experiencing chest pain. Patient still experiencing N/V after 8mg  zofran IV. Patient extremely diaphoretic. Family at bedside. Pt has family hx of cardiac disease. Pt with hx of HTN and borderline diabetes.

## 2011-12-16 ENCOUNTER — Ambulatory Visit (HOSPITAL_COMMUNITY): Payer: Medicaid Other | Attending: Family Medicine

## 2012-03-02 ENCOUNTER — Emergency Department (HOSPITAL_COMMUNITY): Payer: Medicare Other

## 2012-03-02 ENCOUNTER — Inpatient Hospital Stay (HOSPITAL_COMMUNITY)
Admission: EM | Admit: 2012-03-02 | Discharge: 2012-03-09 | DRG: 193 | Disposition: A | Discharge status: Home / Self Care | Payer: Medicare Other | Attending: Internal Medicine | Admitting: Internal Medicine

## 2012-03-02 ENCOUNTER — Encounter (HOSPITAL_COMMUNITY): Payer: Self-pay | Admitting: Emergency Medicine

## 2012-03-02 DIAGNOSIS — I951 Orthostatic hypotension: Secondary | ICD-10-CM | POA: Diagnosis not present

## 2012-03-02 DIAGNOSIS — E785 Hyperlipidemia, unspecified: Secondary | ICD-10-CM | POA: Diagnosis present

## 2012-03-02 DIAGNOSIS — R112 Nausea with vomiting, unspecified: Secondary | ICD-10-CM | POA: Diagnosis present

## 2012-03-02 DIAGNOSIS — I509 Heart failure, unspecified: Secondary | ICD-10-CM | POA: Diagnosis present

## 2012-03-02 DIAGNOSIS — I5031 Acute diastolic (congestive) heart failure: Secondary | ICD-10-CM | POA: Diagnosis present

## 2012-03-02 DIAGNOSIS — D72829 Elevated white blood cell count, unspecified: Secondary | ICD-10-CM | POA: Diagnosis present

## 2012-03-02 DIAGNOSIS — N170 Acute kidney failure with tubular necrosis: Secondary | ICD-10-CM | POA: Diagnosis present

## 2012-03-02 DIAGNOSIS — J189 Pneumonia, unspecified organism: Principal | ICD-10-CM | POA: Diagnosis present

## 2012-03-02 DIAGNOSIS — I10 Essential (primary) hypertension: Secondary | ICD-10-CM | POA: Diagnosis present

## 2012-03-02 DIAGNOSIS — D649 Anemia, unspecified: Secondary | ICD-10-CM | POA: Diagnosis present

## 2012-03-02 DIAGNOSIS — I4891 Unspecified atrial fibrillation: Secondary | ICD-10-CM

## 2012-03-02 DIAGNOSIS — R509 Fever, unspecified: Secondary | ICD-10-CM

## 2012-03-02 HISTORY — DX: Hyperlipidemia, unspecified: E78.5

## 2012-03-02 LAB — URINALYSIS, ROUTINE W REFLEX MICROSCOPIC
Nitrite: NEGATIVE
Protein, ur: NEGATIVE mg/dL
Specific Gravity, Urine: 1.017 (ref 1.005–1.030)
Urobilinogen, UA: 0.2 mg/dL (ref 0.0–1.0)

## 2012-03-02 LAB — POCT I-STAT TROPONIN I: Troponin i, poc: 0.01 ng/mL (ref 0.00–0.08)

## 2012-03-02 LAB — CBC
Hemoglobin: 8.8 g/dL — ABNORMAL LOW (ref 12.0–15.0)
RBC: 4.03 MIL/uL (ref 3.87–5.11)
WBC: 12.5 10*3/uL — ABNORMAL HIGH (ref 4.0–10.5)

## 2012-03-02 LAB — COMPREHENSIVE METABOLIC PANEL
BUN: 29 mg/dL — ABNORMAL HIGH (ref 6–23)
CO2: 22 mEq/L (ref 19–32)
Calcium: 9.6 mg/dL (ref 8.4–10.5)
Creatinine, Ser: 1.7 mg/dL — ABNORMAL HIGH (ref 0.50–1.10)
GFR calc Af Amer: 36 mL/min — ABNORMAL LOW (ref >90)
GFR calc non Af Amer: 31 mL/min — ABNORMAL LOW (ref >90)
Glucose, Bld: 98 mg/dL (ref 70–99)
Total Protein: 8.1 g/dL (ref 6.0–8.3)

## 2012-03-02 LAB — LIPASE, BLOOD: Lipase: 36 U/L (ref 11–59)

## 2012-03-02 MED ORDER — MORPHINE SULFATE 4 MG/ML IJ SOLN
4.0000 mg | Freq: Once | INTRAMUSCULAR | Status: AC
  Administered 2012-03-02: 4 mg via INTRAVENOUS
  Filled 2012-03-02: qty 1

## 2012-03-02 MED ORDER — ASPIRIN 325 MG PO TABS: 325.0000 mg | ORAL_TABLET | ORAL | Status: DC

## 2012-03-02 MED ORDER — ONDANSETRON HCL 4 MG/2ML IJ SOLN
4.0000 mg | Freq: Once | INTRAMUSCULAR | Status: AC
  Administered 2012-03-02: 4 mg via INTRAVENOUS
  Filled 2012-03-02: qty 2

## 2012-03-02 MED ORDER — MOXIFLOXACIN HCL IN NACL 400 MG/250ML IV SOLN
400.0000 mg | Freq: Once | INTRAVENOUS | Status: AC
  Administered 2012-03-02: 400 mg via INTRAVENOUS
  Filled 2012-03-02: qty 250

## 2012-03-02 MED ORDER — SODIUM CHLORIDE 0.9 % IV SOLN
INTRAVENOUS | Status: DC
  Administered 2012-03-02: 21:00:00 via INTRAVENOUS

## 2012-03-02 NOTE — ED Provider Notes (Addendum)
History     CSN: [id number]  Arrival date & time 03/02/12  1478   First MD Initiated Contact with Patient 03/02/12 2000      Chief Complaint  Patient presents with  . Chest Pain    (Consider location/radiation/quality/duration/timing/severity/associated sxs/prior treatment) Patient is a 61 y.o. female presenting with chest pain. The history is provided by the patient and a relative. The history is limited by the condition of the patient.  Chest Pain Primary symptoms include abdominal pain, nausea and vomiting. Pertinent negatives for primary symptoms include no fever, no shortness of breath and no cough.  Associated symptoms include diaphoresis.    the patient is a 61 year old, female, with a history of hypertension, who also smokes.  She presents to emergency department complaining of epigastric pain with nausea, vomiting, and profuse sweating, since yesterday.  She has not recorded a fever, but has had shaking chills.  At home.  She denies urinary symptoms, and she denies diarrhea.  She denies a history of abdominal surgery.  Alcohol use or peptic ulcer disease.  She has a cough but denies shortness of breath.  Level V caveat applies for urgent need for intervention.  Because of severe pain  Past Medical History  Diagnosis Date  . Hypertension   . Borderline diabetes   . Irregular heart beat     Past Surgical History  Procedure Date  . Hemmoroid     No family history on file.  History  Substance Use Topics  . Smoking status: Current Everyday Smoker    Types: Cigarettes  . Smokeless tobacco: Current User  . Alcohol Use:     OB History    Grav Para Term Preterm Abortions TAB SAB Ect Mult Living                  Review of Systems  Constitutional: Positive for diaphoresis. Negative for fever and chills.  Respiratory: Negative for cough and shortness of breath.   Cardiovascular: Negative for chest pain.  Gastrointestinal: Positive for nausea, vomiting and abdominal  pain. Negative for diarrhea.       Pain is in the epigastric area.  Not the chest  Genitourinary: Negative for dysuria.  Skin: Negative for rash.  Neurological: Negative for headaches.  Psychiatric/Behavioral: Negative for confusion.  All other systems reviewed and are negative.    Allergies  Penicillins  Home Medications   Current Outpatient Rx  Name Route Sig Dispense Refill  . AMIODARONE HCL 200 MG PO TABS Oral Take 200 mg by mouth daily.    . ASPIRIN 325 MG PO TBEC Oral Take 325 mg by mouth daily.    Marland Kitchen CALCIUM CARBONATE 600 MG PO TABS Oral Take 600 mg by mouth 2 (two) times daily with a meal.    . CITALOPRAM HYDROBROMIDE 20 MG PO TABS Oral Take 20 mg by mouth daily.    Marland Kitchen DOCUSATE SODIUM 100 MG PO CAPS Oral Take 100 mg by mouth 2 (two) times daily as needed. For constipation    . FERROUS FUMARATE ER 50 MG PO TBCR Oral Take 50 mg by mouth 3 (three) times daily with meals.    . FUROSEMIDE 40 MG PO TABS Oral Take 40 mg by mouth daily.    Marland Kitchen LISINOPRIL 10 MG PO TABS Oral Take 10 mg by mouth daily.    Marland Kitchen LORATADINE 10 MG PO TABS Oral Take 10 mg by mouth daily.    . MELOXICAM 7.5 MG PO TABS Oral Take 7.5 mg by  mouth daily.    Marland Kitchen METOCLOPRAMIDE HCL 5 MG PO TBDP Oral Take 1 tablet by mouth 4 (four) times daily -  before meals and at bedtime.     Marland Kitchen METOPROLOL TARTRATE 25 MG PO TABS Oral Take 2 tablets (50 mg total) by mouth 2 (two) times daily. 60 tablet 0  . POTASSIUM CHLORIDE CRYS ER 20 MEQ PO TBCR Oral Take 20 mEq by mouth daily.     . TRAMADOL HCL 50 MG PO TABS Oral Take 50 mg by mouth every 6 (six) hours as needed. For pain relief      BP 187/96  Pulse 62  Temp 99.6 F (37.6 C)  Resp 20  SpO2 100%  Physical Exam  Constitutional: She is oriented to person, place, and time. She appears well-developed and well-nourished. She appears distressed.       In severe pain with profuse diaphoresis  HENT:  Head: Normocephalic and atraumatic.  Eyes: Conjunctivae and EOM are normal.    Neck: Normal range of motion. Neck supple.  Cardiovascular: Normal rate.   No murmur heard. Pulmonary/Chest: Effort normal and breath sounds normal. No respiratory distress. She has no wheezes. She has no rales.  Abdominal: Soft. There is tenderness.       Epigastric right upper quadrant and right lower quadrant tenderness, with no peritoneal signs  Genitourinary: Guaiac negative stool.       Rectal examination performed with CNA chaperone.  She is heme negative  Musculoskeletal: Normal range of motion.  Neurological: She is alert and oriented to person, place, and time.  Skin: Skin is warm.       Soaking diaphoresis  Psychiatric: She has a normal mood and affect. Judgment and thought content normal.    ED Course  Procedures (including critical care time) 61 year old, female, who smokes cigarettes, presents with severe epigastric pain and nausea and vomiting since yesterday.  She denies a history of coronary artery disease.  She does not have diabetes.  On examination.  She is in distress with profuse diaphoresis and epigastric, right upper quadrant and right lower quadrant tenderness.  Her lungs are clear to auscultation and she is not in respiratory distress.  We will establish an IV perform a chest x-ray, laboratory testing, and EKG for evaluation, who will give her analgesics, and antiemetics, to control her symptoms.     Labs Reviewed  CBC  COMPREHENSIVE METABOLIC PANEL  LIPASE, BLOOD  URINALYSIS, ROUTINE W REFLEX MICROSCOPIC   No results found.   No diagnosis found.  ED ECG REPORT   Date: 03/02/2012  EKG Time: 8:17 PM  Rate: 61  Rhythm: normal sinus rhythm,    Axis: nl  Intervals:none  ST&T Change: none  Narrative Interpretation: nsr with LVH and LAE       9:59 PM Spoke with [facility]. They will admit IV avelox given     MDM  Pneumonia Leukocytosis Hypertension fever        Cheri Guppy, MD 03/02/12 2159  Cheri Guppy, MD 03/02/12  2159  Cheri Guppy, MD 03/02/12 2214

## 2012-03-02 NOTE — ED Notes (Signed)
To ed from home for CP since last night, arrives in 8/10 pain, vomiting, diaphoretic, SOB

## 2012-03-02 NOTE — H&P (Addendum)
[name] is an 61 y.o. female.   PCP - [facility].  Chief Complaint: Fever chills with chest pain and shortness of breath. HPI: 61 year old female with known history of hypertension, atrial fibrillation not a candidate for Coumadin secondary GI bleed on amiodarone presently in sinus rhythm, anemia, presented to the ER because of ongoing subjective feeling of fever chills and drenching sweats and cough productive of sputum and chest pain. Patient states his pain is retrosternal stabbing in nature and happens only on deep inspiration and has no exertional component. She's been having cough or productive last 2 days with some nausea and vomiting. Denies any abdominal pain or diarrhea. In the ER patient was found to be mildly febrile with leukocytosis and chest x-ray stitching possibility of pneumonia. Patient in addition was found to be in drinking sweats. Patient has been [name] antibiotics for community acquired pneumonia. Patient has anemia, for stool for occult blood was negative and patient states she did not have any bleeding per rectum.  Past Medical History  Diagnosis Date  . Hypertension   . Borderline diabetes   . Irregular heart beat   . Hyperlipidemia     Past Surgical History  Procedure Date  . Hemmoroid     History reviewed. No pertinent family history. Social History:  reports that she has been smoking Cigarettes.  She uses smokeless tobacco. She reports that she does not drink alcohol or use illicit drugs.  Allergies:  Allergies  Allergen Reactions  . Penicillins Swelling    Medications Prior to Admission  Medication Dose Route Frequency Provider Last Rate Last Dose  . 0.9 %  sodium chloride infusion   Intravenous Continuous Cheri Guppy, MD 125 mL/hr at 03/02/12 2111    . morphine 4 MG/ML injection 4 mg  4 mg Intravenous Once Cheri Guppy, MD   4 mg at 03/02/12 2015  . morphine 4 MG/ML injection 4 mg  4 mg Intravenous Once Cheri Guppy, MD   4 mg  at 03/02/12 2110  . moxifloxacin (AVELOX) IVPB 400 mg  400 mg Intravenous Once Cheri Guppy, MD   400 mg at 03/02/12 2209  . ondansetron (ZOFRAN) injection 4 mg  4 mg Intravenous Once Cheri Guppy, MD   4 mg at 03/02/12 2015  . ondansetron (ZOFRAN) injection 4 mg  4 mg Intravenous Once Cheri Guppy, MD   4 mg at 03/02/12 2111  . DISCONTD: aspirin tablet 325 mg  325 mg Oral STAT Cheri Guppy, MD       Medications Prior to Admission  Medication Sig Dispense Refill  . amiodarone (PACERONE) 200 MG tablet Take 200 mg by mouth daily.      Marland Kitchen aspirin 325 MG EC tablet Take 325 mg by mouth daily.      . calcium carbonate (OS-CAL) 600 MG TABS Take 600 mg by mouth 2 (two) times daily with a meal.      . citalopram (CELEXA) 20 MG tablet Take 20 mg by mouth daily.      . ferrous fumarate (FERRO-SEQUELS) 50 MG CR tablet Take 50 mg by mouth 3 (three) times daily with meals.      . furosemide (LASIX) 40 MG tablet Take 40 mg by mouth daily.      Marland Kitchen lisinopril (PRINIVIL,ZESTRIL) 10 MG tablet Take 10 mg by mouth daily.      Marland Kitchen loratadine (CLARITIN) 10 MG tablet Take 10 mg by mouth daily.      . meloxicam (MOBIC) 7.5 MG tablet Take 7.5 mg  by mouth daily.      . Metoclopramide HCl 5 MG TBDP Take 1 tablet by mouth 4 (four) times daily -  before meals and at bedtime.       . metoprolol tartrate (LOPRESSOR) 25 MG tablet Take 2 tablets (50 mg total) by mouth 2 (two) times daily.  60 tablet  0  . potassium chloride SA (K-DUR,KLOR-CON) 20 MEQ tablet Take 20 mEq by mouth daily.       . traMADol (ULTRAM) 50 MG tablet Take 50 mg by mouth every 6 (six) hours as needed. For pain relief        Results for orders placed during the hospital encounter of 03/02/12 (from the past 48 hour(s))  CBC     Status: Abnormal   Collection Time   03/02/12  8:03 PM      Component Value Range Comment   WBC 12.5 (*) 4.0 - 10.5 (K/uL)    RBC 4.03  3.87 - 5.11 (MIL/uL)    Hemoglobin 8.8 (*) 12.0 - 15.0 (g/dL)    HCT 16.1  (*) 09.6 - 46.0 (%)    MCV 71.7 (*) 78.0 - 100.0 (fL)    MCH 21.8 (*) 26.0 - 34.0 (pg)    MCHC 30.4  30.0 - 36.0 (g/dL)    RDW 04.5 (*) 40.9 - 15.5 (%)    Platelets 631 (*) 150 - 400 (K/uL)   COMPREHENSIVE METABOLIC PANEL     Status: Abnormal   Collection Time   03/02/12  8:11 PM      Component Value Range Comment   Sodium 137  135 - 145 (mEq/L)    Potassium 3.6  3.5 - 5.1 (mEq/L)    Chloride 102  96 - 112 (mEq/L)    CO2 22  19 - 32 (mEq/L)    Glucose, Bld 98  70 - 99 (mg/dL)    BUN 29 (*) 6 - 23 (mg/dL)    Creatinine, Ser 8.11 (*) 0.50 - 1.10 (mg/dL)    Calcium 9.6  8.4 - 10.5 (mg/dL)    Total Protein 8.1  6.0 - 8.3 (g/dL)    Albumin 3.8  3.5 - 5.2 (g/dL)    AST 14  0 - 37 (U/L)    ALT 21  0 - 35 (U/L)    Alkaline Phosphatase 106  39 - 117 (U/L)    Total Bilirubin 0.2 (*) 0.3 - 1.2 (mg/dL)    GFR calc non Af Amer 31 (*) >90 (mL/min)    GFR calc Af Amer 36 (*) >90 (mL/min)   LIPASE, BLOOD     Status: Normal   Collection Time   03/02/12  8:11 PM      Component Value Range Comment   Lipase 36  11 - 59 (U/L)   POCT I-STAT TROPONIN I     Status: Normal   Collection Time   03/02/12  8:16 PM      Component Value Range Comment   Troponin i, poc 0.01  0.00 - 0.08 (ng/mL)    Comment 3            URINALYSIS, ROUTINE W REFLEX MICROSCOPIC     Status: Normal   Collection Time   03/02/12  8:20 PM      Component Value Range Comment   Color, Urine YELLOW  YELLOW     APPearance CLEAR  CLEAR     Specific Gravity, Urine 1.017  1.005 - 1.030     pH 5.5  5.0 - 8.0  Glucose, UA NEGATIVE  NEGATIVE (mg/dL)    Hgb urine dipstick NEGATIVE  NEGATIVE     Bilirubin Urine NEGATIVE  NEGATIVE     Ketones, ur NEGATIVE  NEGATIVE (mg/dL)    Protein, ur NEGATIVE  NEGATIVE (mg/dL)    Urobilinogen, UA 0.2  0.0 - 1.0 (mg/dL)    Nitrite NEGATIVE  NEGATIVE     Leukocytes, UA NEGATIVE  NEGATIVE  MICROSCOPIC NOT DONE ON URINES WITH NEGATIVE PROTEIN, BLOOD, LEUKOCYTES, NITRITE, OR GLUCOSE <1000 mg/dL.  OCCULT  BLOOD, POC DEVICE     Status: Normal   Collection Time   03/02/12  9:54 PM      Component Value Range Comment   Fecal Occult Bld NEGATIVE      Chest Portable 1 View  03/02/2012  *RADIOLOGY REPORT*  Clinical Data: Chest pain.  PORTABLE CHEST - 1 VIEW  Comparison: Chest radiograph performed 12/13/2011  Findings: Vascular congestion is again noted.  Increased interstitial markings could reflect mild interstitial edema. This is slightly more prominent than on the prior study.  Pneumonia could conceivably have a similar appearance.  No pleural effusion or pneumothorax is seen.  The cardiomediastinal silhouette is borderline enlarged. Calcification is noted within the aortic arch.  No acute osseous abnormalities are identified.  IMPRESSION: Vascular congestion and borderline cardiomegaly, with increased interstitial markings; this could reflect mild interstitial edema, though pneumonia could conceivably have a similar appearance.  Original Report Authenticated By: Tonia Ghent, M.D.    Review of Systems  Constitutional: Positive for chills.  HENT: Negative.   Eyes: Negative.   Respiratory: Positive for cough and sputum production.   Cardiovascular: Positive for chest pain.  Gastrointestinal: Positive for nausea and vomiting.  Genitourinary: Negative.   Musculoskeletal: Negative.   Skin: Negative.   Neurological: Positive for weakness.  Endo/Heme/Allergies: Negative.   Psychiatric/Behavioral: Negative.     Blood pressure 187/90, pulse 62, temperature 99.9 F (37.7 C), temperature source Rectal, resp. rate 20, SpO2 100.00%. Physical Exam  Constitutional: She is oriented to person, place, and time. She appears well-developed and well-nourished. No distress.  HENT:  Head: Normocephalic and atraumatic.  Right Ear: External ear normal.  Left Ear: External ear normal.  Mouth/Throat: No oropharyngeal exudate.  Eyes: Conjunctivae are normal. Pupils are equal, round, and reactive to light. Right eye  exhibits no discharge. Left eye exhibits no discharge. No scleral icterus.  Neck: Normal range of motion. Neck supple.  Cardiovascular: Normal rate and regular rhythm.   Respiratory: Effort normal and breath sounds normal. No respiratory distress. She has no wheezes. She has no rales.  GI: Soft. Bowel sounds are normal. She exhibits no distension. There is no tenderness. There is no rebound.  Musculoskeletal: Normal range of motion. She exhibits no edema and no tenderness.  Neurological: She is alert and oriented to person, place, and time. No cranial nerve deficit. Coordination normal.  Skin: Skin is warm and dry. She is not diaphoretic.  Psychiatric: Her behavior is normal.     Assessment/Plan #1. Community-acquired pneumonia - continue with Levaquin. Since patient is having drenching sweats we'll get blood cultures.  #2. Acute renal failure - her creatinine is mildly increased may be from dehydration. Hold off Lasix for now and closely follow her metabolic panel intake and output. #3. Uncontrolled hypertension - continue lisinopril and I have added when necessary labetalol IV for systolic blood pressure more than 160. #4. Pleuritic-type of chest pain - probably from #1 reason. But we'll cycle cardiac markers. If patient's chest pain  persisted may have to repeat chest x-ray to make sure patient is not developing loculated effusions. #5. History of atrial fibrillation not a candidate for Coumadin secondary to GI bleed presently in sinus rhythm and rate controlled and is on amiodarone. #6. Anemia - stool for occult blood is negative. Closely follow CBC. #7. Nausea and vomiting - this could be from the pneumonia. Her abdomen appears benign and LFTs are normal.  CODE STATUS - full code.   Eduard Clos. 03/02/2012, 11:14 PM

## 2012-03-03 ENCOUNTER — Other Ambulatory Visit: Payer: Self-pay

## 2012-03-03 DIAGNOSIS — I369 Nonrheumatic tricuspid valve disorder, unspecified: Secondary | ICD-10-CM

## 2012-03-03 LAB — DIFFERENTIAL
Basophils Relative: 0 % (ref 0–1)
Eosinophils Absolute: 0 10*3/uL (ref 0.0–0.7)
Eosinophils Relative: 0 % (ref 0–5)
Lymphs Abs: 0.7 10*3/uL (ref 0.7–4.0)
Monocytes Absolute: 0.3 10*3/uL (ref 0.1–1.0)
Neutrophils Relative %: 93 % — ABNORMAL HIGH (ref 43–77)

## 2012-03-03 LAB — COMPREHENSIVE METABOLIC PANEL
ALT: 22 U/L (ref 0–35)
AST: 15 U/L (ref 0–37)
Albumin: 4.3 g/dL (ref 3.5–5.2)
CO2: 22 mEq/L (ref 19–32)
Calcium: 10.4 mg/dL (ref 8.4–10.5)
Chloride: 96 mEq/L (ref 96–112)
GFR calc non Af Amer: 71 mL/min — ABNORMAL LOW (ref >90)
Sodium: 135 mEq/L (ref 135–145)
Total Bilirubin: 0.4 mg/dL (ref 0.3–1.2)

## 2012-03-03 LAB — CBC
Hemoglobin: 10.1 g/dL — ABNORMAL LOW (ref 12.0–15.0)
MCH: 21.6 pg — ABNORMAL LOW (ref 26.0–34.0)
MCHC: 30.7 g/dL (ref 30.0–36.0)
MCV: 70.4 fL — ABNORMAL LOW (ref 78.0–100.0)
RBC: 4.67 MIL/uL (ref 3.87–5.11)

## 2012-03-03 LAB — RAPID URINE DRUG SCREEN, HOSP PERFORMED
Amphetamines: NOT DETECTED
Barbiturates: NOT DETECTED
Opiates: POSITIVE — AB
Tetrahydrocannabinol: POSITIVE — AB

## 2012-03-03 LAB — CARDIAC PANEL(CRET KIN+CKTOT+MB+TROPI)
CK, MB: 2.1 ng/mL (ref 0.3–4.0)
Total CK: 107 U/L (ref 7–177)

## 2012-03-03 LAB — PRO B NATRIURETIC PEPTIDE: Pro B Natriuretic peptide (BNP): 1500 pg/mL — ABNORMAL HIGH (ref 0–125)

## 2012-03-03 MED ORDER — LEVOFLOXACIN IN D5W 750 MG/150ML IV SOLN
750.0000 mg | INTRAVENOUS | Status: DC
  Administered 2012-03-04 – 2012-03-05 (×2): 750 mg via INTRAVENOUS
  Filled 2012-03-03 (×2): qty 150

## 2012-03-03 MED ORDER — LORATADINE 10 MG PO TABS
10.0000 mg | ORAL_TABLET | Freq: Every day | ORAL | Status: DC
  Administered 2012-03-03 – 2012-03-09 (×8): 10 mg via ORAL
  Filled 2012-03-03 (×8): qty 1

## 2012-03-03 MED ORDER — SODIUM CHLORIDE 0.9 % IJ SOLN
3.0000 mL | Freq: Two times a day (BID) | INTRAMUSCULAR | Status: DC
  Administered 2012-03-04 – 2012-03-06 (×2): 3 mL via INTRAVENOUS

## 2012-03-03 MED ORDER — SODIUM CHLORIDE 0.9 % IJ SOLN
3.0000 mL | Freq: Two times a day (BID) | INTRAMUSCULAR | Status: DC
  Administered 2012-03-03 – 2012-03-08 (×8): 3 mL via INTRAVENOUS

## 2012-03-03 MED ORDER — TRAMADOL HCL 50 MG PO TABS
50.0000 mg | ORAL_TABLET | Freq: Four times a day (QID) | ORAL | Status: DC | PRN
  Administered 2012-03-08: 50 mg via ORAL
  Filled 2012-03-03: qty 1

## 2012-03-03 MED ORDER — ONDANSETRON HCL 4 MG PO TABS: 4.0000 mg | ORAL_TABLET | Freq: Four times a day (QID) | ORAL | Status: DC | PRN

## 2012-03-03 MED ORDER — ACETAMINOPHEN 650 MG RE SUPP: 650.0000 mg | Freq: Four times a day (QID) | RECTAL | Status: DC | PRN

## 2012-03-03 MED ORDER — ASPIRIN EC 325 MG PO TBEC
325.0000 mg | DELAYED_RELEASE_TABLET | Freq: Every day | ORAL | Status: DC
  Administered 2012-03-03 – 2012-03-09 (×7): 325 mg via ORAL
  Filled 2012-03-03 (×8): qty 1

## 2012-03-03 MED ORDER — ONDANSETRON HCL 4 MG/2ML IJ SOLN
4.0000 mg | INTRAMUSCULAR | Status: DC | PRN
  Administered 2012-03-03 – 2012-03-06 (×4): 4 mg via INTRAVENOUS
  Filled 2012-03-03 (×4): qty 2

## 2012-03-03 MED ORDER — AMIODARONE HCL 200 MG PO TABS
200.0000 mg | ORAL_TABLET | Freq: Every day | ORAL | Status: DC
  Administered 2012-03-03 – 2012-03-09 (×6): 200 mg via ORAL
  Filled 2012-03-03 (×8): qty 1

## 2012-03-03 MED ORDER — CITALOPRAM HYDROBROMIDE 20 MG PO TABS
20.0000 mg | ORAL_TABLET | Freq: Every day | ORAL | Status: DC
  Administered 2012-03-03 – 2012-03-09 (×7): 20 mg via ORAL
  Filled 2012-03-03 (×8): qty 1

## 2012-03-03 MED ORDER — AMLODIPINE BESYLATE 10 MG PO TABS
10.0000 mg | ORAL_TABLET | Freq: Every day | ORAL | Status: DC
  Administered 2012-03-03 – 2012-03-05 (×3): 10 mg via ORAL
  Filled 2012-03-03 (×5): qty 1

## 2012-03-03 MED ORDER — FERROUS FUMARATE 50 MG PO TBCR: 50.0000 mg | EXTENDED_RELEASE_TABLET | Freq: Three times a day (TID) | ORAL | Status: DC

## 2012-03-03 MED ORDER — ALBUTEROL SULFATE (5 MG/ML) 0.5% IN NEBU: 2.5000 mg | INHALATION_SOLUTION | RESPIRATORY_TRACT | Status: DC | PRN

## 2012-03-03 MED ORDER — ALPRAZOLAM 0.5 MG PO TABS
0.5000 mg | ORAL_TABLET | Freq: Three times a day (TID) | ORAL | Status: DC | PRN
  Administered 2012-03-03 – 2012-03-09 (×8): 0.5 mg via ORAL
  Filled 2012-03-03 (×8): qty 1

## 2012-03-03 MED ORDER — LABETALOL HCL 5 MG/ML IV SOLN
10.0000 mg | INTRAVENOUS | Status: DC | PRN
  Administered 2012-03-03: 10 mg via INTRAVENOUS
  Filled 2012-03-03: qty 4

## 2012-03-03 MED ORDER — ONDANSETRON HCL 4 MG/2ML IJ SOLN
4.0000 mg | Freq: Four times a day (QID) | INTRAMUSCULAR | Status: DC | PRN
  Administered 2012-03-03: 4 mg via INTRAVENOUS
  Filled 2012-03-03: qty 2

## 2012-03-03 MED ORDER — LISINOPRIL 10 MG PO TABS
10.0000 mg | ORAL_TABLET | Freq: Every day | ORAL | Status: DC
  Filled 2012-03-03: qty 1

## 2012-03-03 MED ORDER — ACETAMINOPHEN 325 MG PO TABS: 650.0000 mg | ORAL_TABLET | Freq: Four times a day (QID) | ORAL | Status: DC | PRN

## 2012-03-03 MED ORDER — POTASSIUM CHLORIDE 10 MEQ/100ML IV SOLN
10.0000 meq | INTRAVENOUS | Status: AC
  Administered 2012-03-03 (×3): 10 meq via INTRAVENOUS
  Filled 2012-03-03 (×3): qty 100

## 2012-03-03 MED ORDER — FERROUS FUMARATE 50 MG PO TBCR
50.0000 mg | EXTENDED_RELEASE_TABLET | Freq: Three times a day (TID) | ORAL | Status: DC
  Filled 2012-03-03 (×3): qty 1

## 2012-03-03 MED ORDER — FUROSEMIDE 10 MG/ML IJ SOLN
20.0000 mg | Freq: Two times a day (BID) | INTRAMUSCULAR | Status: DC
  Administered 2012-03-03 – 2012-03-04 (×2): 20 mg via INTRAVENOUS
  Filled 2012-03-03 (×5): qty 2

## 2012-03-03 MED ORDER — FERROUS FUMARATE 325 (106 FE) MG PO TABS
1.0000 | ORAL_TABLET | Freq: Three times a day (TID) | ORAL | Status: DC
  Administered 2012-03-03: 106 mg via ORAL
  Filled 2012-03-03 (×9): qty 1

## 2012-03-03 MED ORDER — LEVOFLOXACIN IN D5W 750 MG/150ML IV SOLN
750.0000 mg | INTRAVENOUS | Status: DC
  Administered 2012-03-03: 750 mg via INTRAVENOUS
  Filled 2012-03-03: qty 150

## 2012-03-03 MED ORDER — DOCUSATE SODIUM 100 MG PO CAPS
100.0000 mg | ORAL_CAPSULE | Freq: Two times a day (BID) | ORAL | Status: DC | PRN
  Filled 2012-03-03: qty 1

## 2012-03-03 MED ORDER — METOPROLOL TARTRATE 50 MG PO TABS
50.0000 mg | ORAL_TABLET | Freq: Two times a day (BID) | ORAL | Status: DC
  Administered 2012-03-03 – 2012-03-04 (×5): 50 mg via ORAL
  Filled 2012-03-03 (×9): qty 1

## 2012-03-03 NOTE — Progress Notes (Signed)
Pt converted to A fib at 0944.  Pt had 4 beats of V tach at 1102.  MD notified.  Will continue to monitor.

## 2012-03-03 NOTE — Progress Notes (Signed)
   CARE MANAGEMENT NOTE 03/03/2012  Patient:  [name], [name]   Account Number:  [id number]  Date Initiated:  03/03/2012  Documentation initiated by:  Lanier Clam  Subjective/Objective Assessment:   ADMITTED W/FEVER,CHILLS     Action/Plan:   FROM HOME W/SON.   Anticipated DC Date:  03/05/2012   Anticipated DC Plan:  HOME/SELF CARE         Choice offered to / List presented to:             Status of service:  In process, will continue to follow Medicare Important Message given?   (If response is "NO", the following Medicare IM given date fields will be blank) Date Medicare IM given:   Date Additional Medicare IM given:    Discharge Disposition:    Per UR Regulation:  Reviewed for med. necessity/level of care/duration of stay  If discussed at Long Length of Stay Meetings, dates discussed:    Comments:  03/03/12 [facility] Amyjo Mizrachi RN,BSN NCM [phone]

## 2012-03-03 NOTE — Progress Notes (Signed)
  Echocardiogram 2D Echocardiogram has been performed.  Cathie Beams Deneen 03/03/2012, 1:30 PM

## 2012-03-03 NOTE — Progress Notes (Addendum)
ANTIBIOTIC CONSULT NOTE - INITIAL  Pharmacy Consult for levofloxacin Indication: pneumonia  Allergies  Allergen Reactions  . Penicillins Swelling    Patient Measurements: Height: 5\' 8"  (172.7 cm) Weight: 179 lb 7.3 oz (81.4 kg) IBW/kg (Calculated) : 63.9  Adjusted Body Weight:   Vital Signs: Temp: 97.7 F (36.5 C) (04/09 2336) Temp src: Oral (04/09 2336) BP: 177/99 mmHg (04/09 2336) Pulse Rate: 51  (04/09 2336) Intake/Output from previous day: 04/09 0701 - 04/10 0700 In: 150 [IV Piggyback:150] Out: 400 [Urine:400] Intake/Output from this shift: Total I/O In: 150 [IV Piggyback:150] Out: 400 [Urine:400]  Labs:  [facility] 03/02/12 2011 03/02/12 2003  WBC -- 12.5*  HGB -- 8.8*  PLT -- 631*  LABCREA -- --  CREATININE 1.70* --   Estimated Creatinine Clearance: 38.9 ml/min (by C-G formula based on Cr of 1.7). No results found for this basename: VANCOTROUGH:2,VANCOPEAK:2,VANCORANDOM:2,GENTTROUGH:2,GENTPEAK:2,GENTRANDOM:2,TOBRATROUGH:2,TOBRAPEAK:2,TOBRARND:2,AMIKACINPEAK:2,AMIKACINTROU:2,AMIKACIN:2, in the last 72 hours   Microbiology: Recent Results (from the past 720 hour(s))  MRSA PCR SCREENING     Status: Normal   Collection Time   03/03/12 12:33 AM      Component Value Range Status Comment   MRSA by PCR NEGATIVE  NEGATIVE  Final     Medical History: Past Medical History  Diagnosis Date  . Hypertension   . Borderline diabetes   . Irregular heart beat   . Hyperlipidemia     Medications:  Anti-infectives     Start     Dose/Rate Route Frequency Ordered Stop   03/03/12 0030   levofloxacin (LEVAQUIN) IVPB 750 mg        750 mg 100 mL/hr over 90 Minutes Intravenous Every 48 hours 03/03/12 0017     03/02/12 2145   moxifloxacin (AVELOX) IVPB 400 mg        400 mg 250 mL/hr over 60 Minutes Intravenous  Once 03/02/12 2136 03/02/12 2309         Assessment: Patient with PNA and poor renal function.  Goal of Therapy:  Levofloxacin dosed based on patient  weight and renal function   Plan:  Levofloxacin 750mg  iv q48hr Watch renal function, adjust dose if needed  Aleene Davidson Crowford 03/03/2012,3:29 AM  ADDENDUM: SCr has dramatically improved (1.7 --> 0.86). CrCl now > 36ml/min. Will increase Levaquin to q24h dosing.  Darrol Angel, PharmD Pager: [phone] 03/03/2012 1:30 PM

## 2012-03-03 NOTE — Progress Notes (Signed)
Subjective: Nausea better, still sweating profusely  Objective: Vital signs in last 24 hours: Temp:  [97.7 F (36.5 C)-99.9 F (37.7 C)] 98.4 F (36.9 C) (04/10 0512) Pulse Rate:  [51-69] 69  (04/10 0512) Resp:  [18-20] 20  (04/10 0512) BP: (152-196)/(84-99) 152/88 mmHg (04/10 0628) SpO2:  [99 %-100 %] 99 % (04/10 0512) Weight:  [81.4 kg (179 lb 7.3 oz)-81.6 kg (179 lb 14.3 oz)] 81.6 kg (179 lb 14.3 oz) (04/10 0512) Weight change:  Last BM Date: 03/02/12  Intake/Output from previous day: 04/09 0701 - 04/10 0700 In: 150 [IV Piggyback:150] Out: 1450 [Urine:1450] Total I/O In: -  Out: 350 [Urine:350]   Physical Exam: General: Alert, awake, oriented x3, in no acute distress, drenching sweats HEENT: No bruits, no goiter. Heart: Regular rate and rhythm, without murmurs, rubs, gallops. Lungs: Clear to auscultation bilaterally. Abdomen: Soft, nontender, nondistended, positive bowel sounds. Extremities: No clubbing cyanosis or edema with positive pedal pulses. Neuro: Grossly intact, nonfocal.   Lab Results: Basic Metabolic Panel:  [facility] 03/02/12 2011  NA 137  K 3.6  CL 102  CO2 22  GLUCOSE 98  BUN 29*  CREATININE 1.70*  CALCIUM 9.6  MG --  PHOS --   Liver Function Tests:  [facility] 03/02/12 2011  AST 14  ALT 21  ALKPHOS 106  BILITOT 0.2*  PROT 8.1  ALBUMIN 3.8    Basename 03/02/12 2011  LIPASE 36  AMYLASE --   No results found for this basename: AMMONIA:2 in the last 72 hours CBC:  Basename 03/02/12 2003  WBC 12.5*  NEUTROABS --  HGB 8.8*  HCT 28.9*  MCV 71.7*  PLT 631*   Cardiac Enzymes:  Basename 03/03/12 0120  CKTOTAL 107  CKMB 2.1  CKMBINDEX --  TROPONINI <0.30   BNP: No results found for this basename: PROBNP:3 in the last 72 hours D-Dimer: No results found for this basename: DDIMER:2 in the last 72 hours CBG: No results found for this basename: GLUCAP:6 in the last 72 hours Hemoglobin A1C: No results found for this basename:  HGBA1C in the last 72 hours Fasting Lipid Panel: No results found for this basename: CHOL,HDL,LDLCALC,TRIG,CHOLHDL,LDLDIRECT in the last 72 hours Thyroid Function Tests: No results found for this basename: TSH,T4TOTAL,FREET4,T3FREE,THYROIDAB in the last 72 hours Anemia Panel: No results found for this basename: VITAMINB12,FOLATE,FERRITIN,TIBC,IRON,RETICCTPCT in the last 72 hours Coagulation: No results found for this basename: LABPROT:2,INR:2 in the last 72 hours Urine Drug Screen: Drugs of Abuse     Component Value Date/Time   LABOPIA POSITIVE* 03/03/2012 0132   COCAINSCRNUR NONE DETECTED 03/03/2012 0132   LABBENZ NONE DETECTED 03/03/2012 0132   AMPHETMU NONE DETECTED 03/03/2012 0132   THCU POSITIVE* 03/03/2012 0132   LABBARB NONE DETECTED 03/03/2012 0132    Alcohol Level: No results found for this basename: ETH:2 in the last 72 hours Urinalysis:  Basename 03/02/12 2020  COLORURINE YELLOW  LABSPEC 1.017  PHURINE 5.5  GLUCOSEU NEGATIVE  HGBUR NEGATIVE  BILIRUBINUR NEGATIVE  KETONESUR NEGATIVE  PROTEINUR NEGATIVE  UROBILINOGEN 0.2  NITRITE NEGATIVE  LEUKOCYTESUR NEGATIVE    Recent Results (from the past 240 hour(s))  MRSA PCR SCREENING     Status: Normal   Collection Time   03/03/12 12:33 AM      Component Value Range Status Comment   MRSA by PCR NEGATIVE  NEGATIVE  Final     Studies/Results: Chest Portable 1 View  03/02/2012  *RADIOLOGY REPORT*  Clinical Data: Chest pain.  PORTABLE CHEST - 1 VIEW  Comparison: Chest radiograph performed 12/13/2011  Findings: Vascular congestion is again noted.  Increased interstitial markings could reflect mild interstitial edema. This is slightly more prominent than on the prior study.  Pneumonia could conceivably have a similar appearance.  No pleural effusion or pneumothorax is seen.  The cardiomediastinal silhouette is borderline enlarged. Calcification is noted within the aortic arch.  No acute osseous abnormalities are identified.   IMPRESSION: Vascular congestion and borderline cardiomegaly, with increased interstitial markings; this could reflect mild interstitial edema, though pneumonia could conceivably have a similar appearance.  Original Report Authenticated By: Tonia Ghent, M.D.    Medications: Scheduled Meds:   . amiodarone  200 mg Oral Daily  . aspirin  325 mg Oral Daily  . citalopram  20 mg Oral Daily  . ferrous fumarate  1 tablet Oral TID WC  . levofloxacin (LEVAQUIN) IV  750 mg Intravenous Q48H  . loratadine  10 mg Oral Daily  . metoprolol tartrate  50 mg Oral BID  .  morphine injection  4 mg Intravenous Once  .  morphine injection  4 mg Intravenous Once  . moxifloxacin  400 mg Intravenous Once  . ondansetron (ZOFRAN) IV  4 mg Intravenous Once  . ondansetron (ZOFRAN) IV  4 mg Intravenous Once  . sodium chloride  3 mL Intravenous Q12H  . sodium chloride  3 mL Intravenous Q12H  . DISCONTD: aspirin  325 mg Oral STAT  . DISCONTD: ferrous fumarate  50 mg Oral TID WC  . DISCONTD: ferrous fumarate  50 mg Oral TID WC  . DISCONTD: lisinopril  10 mg Oral Daily   Continuous Infusions:   . DISCONTD: sodium chloride 125 mL/hr at 03/02/12 2111   PRN Meds:.acetaminophen, acetaminophen, albuterol, docusate sodium, labetalol, ondansetron (ZOFRAN) IV, ondansetron, traMADol  Assessment/Plan:  #1. Community-acquired pneumonia - continue with Levaquin. FU blood cultures Also concern for CHF- check BNP/repeat CXR in am #2. Acute renal failure - her creatinine is mildly increased may be from dehydration, ACE. Hold off Lasix for now and closely follow her metabolic panel intake and output.  #3. Uncontrolled hypertension - hold lisinopril, add amlodipine, and continue PRN labetalol IV for systolic blood pressure more than 160.  #4. Pleuritic-type of chest pain - probably from #1 reason. But we'll cycle cardiac markers. If patient's chest pain persisted may have to repeat chest x-ray to make sure patient is not  developing loculated effusions.  #5. History of atrial fibrillation not a candidate for Coumadin secondary to GI bleed presently in sinus rhythm and rate controlled and is on amiodarone.  #6. Anemia - stool for occult blood is negative. Closely follow CBC.  #7. Nausea and vomiting - this could be from the pneumonia. Her abdomen appears benign and LFTs are normal.  CODE STATUS - full code.   LOS: 1 day   [facility] Pager: [phone] 03/03/2012, 8:24 AM

## 2012-03-04 ENCOUNTER — Inpatient Hospital Stay (HOSPITAL_COMMUNITY): Payer: Medicare Other

## 2012-03-04 LAB — URINALYSIS, ROUTINE W REFLEX MICROSCOPIC
Bilirubin Urine: NEGATIVE
Glucose, UA: NEGATIVE mg/dL
Ketones, ur: NEGATIVE mg/dL
pH: 6 (ref 5.0–8.0)

## 2012-03-04 LAB — CBC
MCH: 22.2 pg — ABNORMAL LOW (ref 26.0–34.0)
MCHC: 31.1 g/dL (ref 30.0–36.0)
MCV: 71.6 fL — ABNORMAL LOW (ref 78.0–100.0)
Platelets: 737 10*3/uL — ABNORMAL HIGH (ref 150–400)
RBC: 4.72 MIL/uL (ref 3.87–5.11)

## 2012-03-04 LAB — URINE MICROSCOPIC-ADD ON

## 2012-03-04 LAB — BASIC METABOLIC PANEL
CO2: 25 mEq/L (ref 19–32)
Calcium: 10 mg/dL (ref 8.4–10.5)
Creatinine, Ser: 1.17 mg/dL — ABNORMAL HIGH (ref 0.50–1.10)

## 2012-03-04 LAB — PRO B NATRIURETIC PEPTIDE: Pro B Natriuretic peptide (BNP): 2374 pg/mL — ABNORMAL HIGH (ref 0–125)

## 2012-03-04 MED ORDER — METOCLOPRAMIDE HCL 5 MG/ML IJ SOLN
5.0000 mg | Freq: Three times a day (TID) | INTRAMUSCULAR | Status: DC
  Administered 2012-03-04 – 2012-03-09 (×16): 5 mg via INTRAVENOUS
  Filled 2012-03-04 (×5): qty 1
  Filled 2012-03-04: qty 2
  Filled 2012-03-04 (×6): qty 1
  Filled 2012-03-04: qty 2
  Filled 2012-03-04 (×3): qty 1
  Filled 2012-03-04: qty 2
  Filled 2012-03-04: qty 1

## 2012-03-04 MED ORDER — FUROSEMIDE 20 MG PO TABS
20.0000 mg | ORAL_TABLET | Freq: Every day | ORAL | Status: DC
  Administered 2012-03-05: 20 mg via ORAL
  Filled 2012-03-04 (×2): qty 1

## 2012-03-04 NOTE — Progress Notes (Signed)
Subjective: Overall better, still nauseous  Objective: Vital signs in last 24 hours: Temp:  [98.5 F (36.9 C)-99.2 F (37.3 C)] 98.5 F (36.9 C) (04/11 0528) Pulse Rate:  [62-118] 62  (04/11 0528) Resp:  [18-20] 18  (04/11 0528) BP: (145-154)/(82-114) 151/82 mmHg (04/11 0528) SpO2:  [97 %-100 %] 99 % (04/11 0528) Weight:  [81.2 kg (179 lb 0.2 oz)] 81.2 kg (179 lb 0.2 oz) (04/11 0528) Weight change: -0.2 kg (-7.1 oz) Last BM Date: 03/02/12  Intake/Output from previous day: 04/10 0701 - 04/11 0700 In: 1050 [P.O.:600; IV Piggyback:450] Out: 2075 [Urine:2075]     Physical Exam: General: Alert, awake, oriented x3, in no acute distress, drenching sweats HEENT: No bruits, no goiter. Heart: Regular rate and rhythm, without murmurs, rubs, gallops. Lungs: Clear to auscultation bilaterally. Abdomen: Soft, nontender, nondistended, positive bowel sounds. Extremities: No clubbing cyanosis or edema with positive pedal pulses. Neuro: Grossly intact, nonfocal.   Lab Results: Basic Metabolic Panel:  Basename 03/04/12 0510 03/03/12 1010  NA 136 135  K 3.2* 3.3*  CL 98 96  CO2 25 22  GLUCOSE 136* 163*  BUN 18 13  CREATININE 1.17* 0.86  CALCIUM 10.0 10.4  MG -- 1.9  PHOS -- --   Liver Function Tests:  [facility] 03/03/12 1010 03/02/12 2011  AST 15 14  ALT 22 21  ALKPHOS 120* 106  BILITOT 0.4 0.2*  PROT 9.5* 8.1  ALBUMIN 4.3 3.8    Basename 03/02/12 2011  LIPASE 36  AMYLASE --   No results found for this basename: AMMONIA:2 in the last 72 hours CBC:  Basename 03/04/12 0510 03/03/12 1010  WBC 19.8* 13.2*  NEUTROABS -- 12.2*  HGB 10.5* 10.1*  HCT 33.8* 32.9*  MCV 71.6* 70.4*  PLT 737* 716*   Cardiac Enzymes:  Basename 03/03/12 0120  CKTOTAL 107  CKMB 2.1  CKMBINDEX --  TROPONINI <0.30   BNP:  Basename 03/04/12 0510 03/03/12 1010  PROBNP 2374.0* 1500.0*   D-Dimer: No results found for this basename: DDIMER:2 in the last 72 hours CBG: No results found  for this basename: GLUCAP:6 in the last 72 hours Hemoglobin A1C: No results found for this basename: HGBA1C in the last 72 hours Fasting Lipid Panel: No results found for this basename: CHOL,HDL,LDLCALC,TRIG,CHOLHDL,LDLDIRECT in the last 72 hours Thyroid Function Tests: No results found for this basename: TSH,T4TOTAL,FREET4,T3FREE,THYROIDAB in the last 72 hours Anemia Panel: No results found for this basename: VITAMINB12,FOLATE,FERRITIN,TIBC,IRON,RETICCTPCT in the last 72 hours Coagulation: No results found for this basename: LABPROT:2,INR:2 in the last 72 hours Urine Drug Screen: Drugs of Abuse     Component Value Date/Time   LABOPIA POSITIVE* 03/03/2012 0132   COCAINSCRNUR NONE DETECTED 03/03/2012 0132   LABBENZ NONE DETECTED 03/03/2012 0132   AMPHETMU NONE DETECTED 03/03/2012 0132   THCU POSITIVE* 03/03/2012 0132   LABBARB NONE DETECTED 03/03/2012 0132    Alcohol Level: No results found for this basename: ETH:2 in the last 72 hours Urinalysis:  Basename 03/02/12 2020  COLORURINE YELLOW  LABSPEC 1.017  PHURINE 5.5  GLUCOSEU NEGATIVE  HGBUR NEGATIVE  BILIRUBINUR NEGATIVE  KETONESUR NEGATIVE  PROTEINUR NEGATIVE  UROBILINOGEN 0.2  NITRITE NEGATIVE  LEUKOCYTESUR NEGATIVE    Recent Results (from the past 240 hour(s))  MRSA PCR SCREENING     Status: Normal   Collection Time   03/03/12 12:33 AM      Component Value Range Status Comment   MRSA by PCR NEGATIVE  NEGATIVE  Final   CULTURE, BLOOD (ROUTINE X  2)     Status: Normal (Preliminary result)   Collection Time   03/03/12  1:20 AM      Component Value Range Status Comment   Specimen Description BLOOD LEFT HAND   Final    Special Requests BOTTLES DRAWN AEROBIC AND ANAEROBIC 3CC   Final    Culture  Setup Time [id number]   Final    Culture     Final    Value:        BLOOD CULTURE RECEIVED NO GROWTH TO DATE CULTURE WILL BE HELD FOR 5 DAYS BEFORE ISSUING A FINAL NEGATIVE REPORT   Report Status PENDING   Incomplete      Studies/Results: Dg Chest 2 View  03/04/2012  *RADIOLOGY REPORT*  Clinical Data: Follow up pneumonia  CHEST - 2 VIEW  Comparison: 03/02/2012  Findings: Cardiomegaly again noted.  There is improved aeration without convincing pulmonary edema.  No focal infiltrate.  Stable mild degenerative changes thoracic spine.  IMPRESSION: Improvement in aeration.  No convincing pulmonary edema.  No focal infiltrate.  Original Report Authenticated By: Natasha Mead, M.D.   Chest Portable 1 View  03/02/2012  *RADIOLOGY REPORT*  Clinical Data: Chest pain.  PORTABLE CHEST - 1 VIEW  Comparison: Chest radiograph performed 12/13/2011  Findings: Vascular congestion is again noted.  Increased interstitial markings could reflect mild interstitial edema. This is slightly more prominent than on the prior study.  Pneumonia could conceivably have a similar appearance.  No pleural effusion or pneumothorax is seen.  The cardiomediastinal silhouette is borderline enlarged. Calcification is noted within the aortic arch.  No acute osseous abnormalities are identified.  IMPRESSION: Vascular congestion and borderline cardiomegaly, with increased interstitial markings; this could reflect mild interstitial edema, though pneumonia could conceivably have a similar appearance.  Original Report Authenticated By: Tonia Ghent, M.D.    Medications: Scheduled Meds:    . amiodarone  200 mg Oral Daily  . amLODipine  10 mg Oral Daily  . aspirin  325 mg Oral Daily  . citalopram  20 mg Oral Daily  . ferrous fumarate  1 tablet Oral TID WC  . furosemide  20 mg Oral Daily  . levofloxacin (LEVAQUIN) IV  750 mg Intravenous Q24H  . loratadine  10 mg Oral Daily  . metoprolol tartrate  50 mg Oral BID  . potassium chloride  10 mEq Intravenous Q1 Hr x 3  . sodium chloride  3 mL Intravenous Q12H  . sodium chloride  3 mL Intravenous Q12H  . DISCONTD: furosemide  20 mg Intravenous Q12H  . DISCONTD: levofloxacin (LEVAQUIN) IV  750 mg Intravenous Q48H    Continuous Infusions:  PRN Meds:.acetaminophen, acetaminophen, albuterol, ALPRAZolam, docusate sodium, labetalol, ondansetron (ZOFRAN) IV, ondansetron, traMADol, DISCONTD: ondansetron (ZOFRAN) IV, DISCONTD: ondansetron  Assessment/Plan:  #1. Community-acquired pneumonia vs bronchitis - continue with Levaquin. FU blood cultures #2. Acute on chronic diastolic CHF-improved, change lasix to PO Also concern for CHF- check BNP/repeat CXR in am, continue metoprolol #3. Uncontrolled hypertension - hold lisinopril, add amlodipine, and continue PRN labetalol IV for systolic blood pressure more than 160.  #4. Intractable nausea and vomiting: had extensive w/u with negative EGD/colonoscopy/HIDA in past, couldn't tolerate Gastric emptying scan last time, re-attempt now Add reglan Q AC Stop Ferrous fumarate #5. History of atrial fibrillation not a candidate for Coumadin secondary to GI bleed presently in sinus rhythm and rate controlled and is on amiodarone.  #6. Anemia - stool for occult blood is negative. Closely follow CBC.  #7. Leukocytosis: check  UA, she has chronic leukocytosis, may need a heme evaluation as outpt CODE STATUS - full code.   LOS: 2 days   [facility] Pager: [phone] 03/04/2012, 10:10 AM

## 2012-03-05 LAB — CARDIAC PANEL(CRET KIN+CKTOT+MB+TROPI)
CK, MB: 1.6 ng/mL (ref 0.3–4.0)
Relative Index: INVALID (ref 0.0–2.5)
Total CK: 62 U/L (ref 7–177)
Troponin I: <0.3 ng/mL (ref <0.30)

## 2012-03-05 LAB — T3, FREE: T3, Free: 1.9 pg/mL — ABNORMAL LOW (ref 2.3–4.2)

## 2012-03-05 LAB — BASIC METABOLIC PANEL
CO2: 22 mEq/L (ref 19–32)
Chloride: 98 mEq/L (ref 96–112)
Sodium: 134 mEq/L — ABNORMAL LOW (ref 135–145)

## 2012-03-05 LAB — TSH: TSH: 1.218 u[IU]/mL (ref 0.350–4.500)

## 2012-03-05 LAB — T4, FREE: Free T4: 1.92 ng/dL — ABNORMAL HIGH (ref 0.80–1.80)

## 2012-03-05 LAB — CBC
Platelets: 720 10*3/uL — ABNORMAL HIGH (ref 150–400)
RBC: 5.09 MIL/uL (ref 3.87–5.11)
WBC: 18.5 10*3/uL — ABNORMAL HIGH (ref 4.0–10.5)

## 2012-03-05 MED ORDER — DILTIAZEM HCL 100 MG IV SOLR
5.0000 mg/h | INTRAVENOUS | Status: DC
  Administered 2012-03-05: 5 mg/h via INTRAVENOUS
  Filled 2012-03-05: qty 100

## 2012-03-05 MED ORDER — METOPROLOL TARTRATE 1 MG/ML IV SOLN
5.0000 mg | Freq: Once | INTRAVENOUS | Status: AC
  Administered 2012-03-05: 5 mg via INTRAVENOUS
  Filled 2012-03-05: qty 5

## 2012-03-05 MED ORDER — METOPROLOL TARTRATE 1 MG/ML IV SOLN
5.0000 mg | Freq: Once | INTRAVENOUS | Status: AC
  Administered 2012-03-05: 5 mg via INTRAVENOUS

## 2012-03-05 MED ORDER — METOPROLOL TARTRATE 1 MG/ML IV SOLN
INTRAVENOUS | Status: AC
  Filled 2012-03-05: qty 5

## 2012-03-05 MED ORDER — METOPROLOL TARTRATE 50 MG PO TABS
75.0000 mg | ORAL_TABLET | Freq: Two times a day (BID) | ORAL | Status: DC
  Administered 2012-03-05 (×2): 75 mg via ORAL
  Filled 2012-03-05 (×4): qty 1

## 2012-03-05 MED ORDER — LORAZEPAM 2 MG/ML IJ SOLN
1.0000 mg | Freq: Once | INTRAMUSCULAR | Status: AC
  Administered 2012-03-05: 1 mg via INTRAVENOUS
  Filled 2012-03-05: qty 1

## 2012-03-05 MED ORDER — LEVOFLOXACIN 500 MG PO TABS
500.0000 mg | ORAL_TABLET | Freq: Every day | ORAL | Status: DC
  Administered 2012-03-05 – 2012-03-06 (×2): 500 mg via ORAL
  Filled 2012-03-05 (×2): qty 1

## 2012-03-05 MED ORDER — PANTOPRAZOLE SODIUM 40 MG PO TBEC
40.0000 mg | DELAYED_RELEASE_TABLET | Freq: Every day | ORAL | Status: DC
  Administered 2012-03-06 – 2012-03-09 (×4): 40 mg via ORAL
  Filled 2012-03-05 (×4): qty 1

## 2012-03-05 NOTE — Progress Notes (Signed)
Subjective:  still nauseous, very poor PO intake, Overnight afib with RVR  Objective: Vital signs in last 24 hours: Temp:  [98.3 F (36.8 C)-99.3 F (37.4 C)] 98.6 F (37 C) (04/12 0639) Pulse Rate:  [59-127] 99  (04/12 0639) Resp:  [18] 18  (04/12 0639) BP: (121-169)/(72-98) 159/98 mmHg (04/12 0639) SpO2:  [97 %-98 %] 97 % (04/12 0639) Weight:  [77.7 kg (171 lb 4.8 oz)] 77.7 kg (171 lb 4.8 oz) (04/12 0639) Weight change: -3.5 kg (-7 lb 11.5 oz) Last BM Date: 03/02/12  Intake/Output from previous day: 04/11 0701 - 04/12 0700 In: 120 [P.O.:120] Out: -    Physical Exam: General: Alert, awake, oriented x3, in no acute distress, drenching sweats HEENT: No bruits, no goiter. Heart: Irregular rate and rhythm, without murmurs, rubs, gallops. Lungs: Clear to auscultation bilaterally. Abdomen: Soft, nontender, nondistended, positive bowel sounds. Extremities: No clubbing cyanosis or edema with positive pedal pulses. Neuro: Grossly intact, nonfocal.   Lab Results: Basic Metabolic Panel:  Basename 03/05/12 0415 03/04/12 0510 03/03/12 1010  NA 134* 136 --  K 3.2* 3.2* --  CL 98 98 --  CO2 22 25 --  GLUCOSE 120* 136* --  BUN 23 18 --  CREATININE 1.33* 1.17* --  CALCIUM 10.2 10.0 --  MG -- -- 1.9  PHOS -- -- --   Liver Function Tests:  [facility] 03/03/12 1010 03/02/12 2011  AST 15 14  ALT 22 21  ALKPHOS 120* 106  BILITOT 0.4 0.2*  PROT 9.5* 8.1  ALBUMIN 4.3 3.8    Basename 03/02/12 2011  LIPASE 36  AMYLASE --   No results found for this basename: AMMONIA:2 in the last 72 hours CBC:  Basename 03/05/12 0415 03/04/12 0510 03/03/12 1010  WBC 18.5* 19.8* --  NEUTROABS -- -- 12.2*  HGB 11.2* 10.5* --  HCT 35.9* 33.8* --  MCV 70.5* 71.6* --  PLT 720* 737* --   Cardiac Enzymes:  Basename 03/05/12 0415 03/03/12 0120  CKTOTAL 62 107  CKMB 1.6 2.1  CKMBINDEX -- --  TROPONINI <0.30 <0.30   BNP:  Basename 03/04/12 0510 03/03/12 1010  PROBNP 2374.0* 1500.0*    D-Dimer: No results found for this basename: DDIMER:2 in the last 72 hours CBG: No results found for this basename: GLUCAP:6 in the last 72 hours Hemoglobin A1C: No results found for this basename: HGBA1C in the last 72 hours Fasting Lipid Panel: No results found for this basename: CHOL,HDL,LDLCALC,TRIG,CHOLHDL,LDLDIRECT in the last 72 hours Thyroid Function Tests: No results found for this basename: TSH,T4TOTAL,FREET4,T3FREE,THYROIDAB in the last 72 hours Anemia Panel: No results found for this basename: VITAMINB12,FOLATE,FERRITIN,TIBC,IRON,RETICCTPCT in the last 72 hours Coagulation: No results found for this basename: LABPROT:2,INR:2 in the last 72 hours Urine Drug Screen: Drugs of Abuse     Component Value Date/Time   LABOPIA POSITIVE* 03/03/2012 0132   COCAINSCRNUR NONE DETECTED 03/03/2012 0132   LABBENZ NONE DETECTED 03/03/2012 0132   AMPHETMU NONE DETECTED 03/03/2012 0132   THCU POSITIVE* 03/03/2012 0132   LABBARB NONE DETECTED 03/03/2012 0132    Alcohol Level: No results found for this basename: ETH:2 in the last 72 hours Urinalysis:  Basename 03/04/12 1241 03/02/12 2020  COLORURINE YELLOW YELLOW  LABSPEC 1.018 1.017  PHURINE 6.0 5.5  GLUCOSEU NEGATIVE NEGATIVE  HGBUR NEGATIVE NEGATIVE  BILIRUBINUR NEGATIVE NEGATIVE  KETONESUR NEGATIVE NEGATIVE  PROTEINUR 30* NEGATIVE  UROBILINOGEN 0.2 0.2  NITRITE NEGATIVE NEGATIVE  LEUKOCYTESUR NEGATIVE NEGATIVE    Recent Results (from the past 240 hour(s))  MRSA PCR SCREENING     Status: Normal   Collection Time   03/03/12 12:33 AM      Component Value Range Status Comment   MRSA by PCR NEGATIVE  NEGATIVE  Final   CULTURE, BLOOD (ROUTINE X 2)     Status: Normal (Preliminary result)   Collection Time   03/03/12  1:20 AM      Component Value Range Status Comment   Specimen Description BLOOD LEFT HAND   Final    Special Requests BOTTLES DRAWN AEROBIC AND ANAEROBIC 3CC   Final    Culture  Setup Time [id number]   Final     Culture     Final    Value:        BLOOD CULTURE RECEIVED NO GROWTH TO DATE CULTURE WILL BE HELD FOR 5 DAYS BEFORE ISSUING A FINAL NEGATIVE REPORT   Report Status PENDING   Incomplete     Studies/Results: Dg Chest 2 View  03/04/2012  *RADIOLOGY REPORT*  Clinical Data: Follow up pneumonia  CHEST - 2 VIEW  Comparison: 03/02/2012  Findings: Cardiomegaly again noted.  There is improved aeration without convincing pulmonary edema.  No focal infiltrate.  Stable mild degenerative changes thoracic spine.  IMPRESSION: Improvement in aeration.  No convincing pulmonary edema.  No focal infiltrate.  Original Report Authenticated By: Natasha Mead, M.D.    Medications: Scheduled Meds:    . amiodarone  200 mg Oral Daily  . amLODipine  10 mg Oral Daily  . aspirin  325 mg Oral Daily  . citalopram  20 mg Oral Daily  . furosemide  20 mg Oral Daily  . levofloxacin (LEVAQUIN) IV  750 mg Intravenous Q24H  . loratadine  10 mg Oral Daily  . LORazepam  1 mg Intravenous Once  . metoCLOPramide (REGLAN) injection  5 mg Intravenous TID AC  . metoprolol  5 mg Intravenous Once  . metoprolol  5 mg Intravenous Once  . metoprolol tartrate  75 mg Oral BID  . sodium chloride  3 mL Intravenous Q12H  . sodium chloride  3 mL Intravenous Q12H  . DISCONTD: ferrous fumarate  1 tablet Oral TID WC  . DISCONTD: furosemide  20 mg Intravenous Q12H  . DISCONTD: metoprolol tartrate  50 mg Oral BID   Continuous Infusions:    . diltiazem (CARDIZEM) infusion 10 mg/hr (03/05/12 0615)   PRN Meds:.acetaminophen, acetaminophen, albuterol, ALPRAZolam, docusate sodium, labetalol, ondansetron (ZOFRAN) IV, ondansetron, traMADol  Assessment/Plan:  #1. Community-acquired pneumonia vs bronchitis - continue with Levaquin, Day 3/7. blood cultures negative #2. Acute on chronic diastolic CHF-improved, continue PO lasix continue metoprolol #3. Afib with RVR-continue Amiodarone, increase metoprolol dose, not candidate for anticoagulation due  to h/o GI bleeds Wean diltiazem gtt today #4. Uncontrolled hypertension - hold lisinopril, add amlodipine, and continue PRN labetalol IV for systolic blood pressure more than 160.  #5. Intractable nausea and vomiting: had extensive w/u with negative EGD/colonoscopy/HIDA in past, couldn't tolerate Gastric emptying scan last time, re-attempt once HR better Add reglan Q AC Stop Ferrous fumarate #6. Anemia - stool for occult blood is negative. Closely follow CBC.  #7. Leukocytosis: check UA, she has chronic leukocytosis, may need a heme evaluation as outpt, remains afebrile CODE STATUS - full code.   LOS: 3 days   [facility] Pager: [phone] 03/05/2012, 8:26 AM

## 2012-03-05 NOTE — Progress Notes (Signed)
NP, [name] called back with orders of one time dose Lopressor 5 mg IV for elevated HR and A-fib.  MD on call also notified.  Pt's BP 121/82, asymptomatic.  Will continue to monitor pt.

## 2012-03-05 NOTE — Progress Notes (Signed)
Pt was in NSR and flipped into A-fib since around 2300.  MD on call notified.  No new orders currently.

## 2012-03-05 NOTE — Progress Notes (Signed)
Second dose of IV Lopressor 5 mg ordered for pt.  HR briefly decreased to 80's and 90's, then jumped up into the 120's.  MD on call notified and saw pt.  May order Cardizem drip if pt's HR continues to increase/fluctuate.  NP and MD aware and following pt.  Will continue to monitor pt.

## 2012-03-05 NOTE — Progress Notes (Signed)
Pt had HR fluctuating between 80's-140's and even reached as high as 160 but did not sustain there.  Pt  asymptomatic, sleeping at time.  Pt's HR has fluctuated consistently throughout the night since around 2300 when we were able to get telemetry reconnected after pt had bed bath and perfuse sweating. Pt's HR tends to stay in the 100's-120's mostly.  Paged MD on call, [name], told that she will call back with orders and once we checked her BP.

## 2012-03-05 NOTE — Progress Notes (Signed)
0400: HR trending up x 1 to 2 hrs. Now A-Fib w/ RVR @ 117-160's  (from 60 in NSR). BP 121/82 from 169/92. Pt reports is dizzy and nauseated but denies CP. Dr Toniann Fail notified and has evaluated pt w/ me. Lopressor 5mg  given IVP w/ some improvement in rate (105-115), BP 141/94. Second bolus given (Lopressor 5mg  IVP). Brief rate in 80-90's but w/ rapid return to rate of 115-130. Bp 13081. Spoke w/ Dr Toniann Fail. Will initaite Cardizem drip at 5mg /hr and titrate for goal rate of 100 or <.Marland Kitchen

## 2012-03-05 NOTE — Progress Notes (Signed)
Patient converted from a.fib to NSR with a HR in mid 60's/low 70's. BP 136/85.  Pt. Still feels slightly nauseous but doesn't have any other complaints at this time.  Spoke with MD regarding rate change.  New orders to d/c IV cardizem gtt.  Will d/c gtt and monitor patient's HR for and changes.  [name], [name]

## 2012-03-05 NOTE — Progress Notes (Addendum)
Spoke with MD regarding patient's status change during night shift with converting to A.fib.  Patient was scheduled to have gastric emptying study today.  MD wanted to hold test at this time due to patient's condition and because she is currently on a cardizem gtt.  Spoke with nuclear med regarding holding the test at this time.  Will continue to monitor patient's HR and notify MD with changes.  Jvon Meroney, Joslyn Devon

## 2012-03-05 NOTE — Progress Notes (Signed)
NP, [name] ordered Cardizem drip for pt.  Began Cardizem drip.  Will continue to monitor BP and HR every 15 mins for the first hour.  Will titrate if unable to maintain target HR ordered by MD.

## 2012-03-06 LAB — BASIC METABOLIC PANEL
CO2: 21 mEq/L (ref 19–32)
Chloride: 97 mEq/L (ref 96–112)
Potassium: 3.6 mEq/L (ref 3.5–5.1)
Sodium: 134 mEq/L — ABNORMAL LOW (ref 135–145)

## 2012-03-06 LAB — CBC
Platelets: 516 10*3/uL — ABNORMAL HIGH (ref 150–400)
RBC: 5.07 MIL/uL (ref 3.87–5.11)
WBC: 15.1 10*3/uL — ABNORMAL HIGH (ref 4.0–10.5)

## 2012-03-06 MED ORDER — POLYETHYLENE GLYCOL 3350 17 G PO PACK
17.0000 g | PACK | Freq: Every day | ORAL | Status: DC
  Administered 2012-03-06 – 2012-03-07 (×2): 17 g via ORAL
  Filled 2012-03-06 (×3): qty 1

## 2012-03-06 MED ORDER — LEVOFLOXACIN 250 MG PO TABS
250.0000 mg | ORAL_TABLET | Freq: Every day | ORAL | Status: DC
  Administered 2012-03-07: 250 mg via ORAL
  Filled 2012-03-06 (×2): qty 1

## 2012-03-06 MED ORDER — METOPROLOL TARTRATE 25 MG PO TABS
25.0000 mg | ORAL_TABLET | Freq: Two times a day (BID) | ORAL | Status: DC
  Administered 2012-03-06: 75 mg via ORAL
  Filled 2012-03-06 (×6): qty 1

## 2012-03-06 NOTE — Progress Notes (Signed)
Subjective: Nausea better, HR better, rhythm back and forth with afib Ate some dinner  Objective: Vital signs in last 24 hours: Temp:  [97.6 F (36.4 C)-99.1 F (37.3 C)] 97.6 F (36.4 C) (04/13 0530) Pulse Rate:  [50-73] 50  (04/13 0530) Resp:  [16-18] 16  (04/13 0530) BP: (101-149)/(64-85) 101/64 mmHg (04/13 0530) SpO2:  [98 %-100 %] 100 % (04/13 0530) Weight:  [76.9 kg (169 lb 8.5 oz)] 76.9 kg (169 lb 8.5 oz) (04/13 0530) Weight change: -0.8 kg (-1 lb 12.2 oz) Last BM Date: 03/02/12 (Pt states "skipping" is normal to her.)  Intake/Output from previous day: 04/12 0701 - 04/13 0700 In: 43 [I.V.:40; IV Piggyback:3] Out: 400 [Urine:400]   Physical Exam: General: Alert, awake, oriented x3, in no acute distress, drenching sweats HEENT: No bruits, no goiter. Heart: Irregular rate and rhythm, without murmurs, rubs, gallops. Lungs: Clear to auscultation bilaterally. Abdomen: Soft, nontender, nondistended, positive bowel sounds. Extremities: No clubbing cyanosis or edema with positive pedal pulses. Neuro: Grossly intact, nonfocal.   Lab Results: Basic Metabolic Panel:  Basename 03/06/12 0555 03/05/12 0415 03/03/12 1010  NA 134* 134* --  K 3.6 3.2* --  CL 97 98 --  CO2 21 22 --  GLUCOSE 103* 120* --  BUN 34* 23 --  CREATININE PENDING 1.33* --  CALCIUM PENDING 10.2 --  MG -- -- 1.9  PHOS -- -- --   Liver Function Tests:  Basename 03/03/12 1010  AST 15  ALT 22  ALKPHOS 120*  BILITOT 0.4  PROT 9.5*  ALBUMIN 4.3   No results found for this basename: LIPASE:2,AMYLASE:2 in the last 72 hours No results found for this basename: AMMONIA:2 in the last 72 hours CBC:  Basename 03/06/12 0555 03/05/12 0415 03/03/12 1010  WBC 15.1* 18.5* --  NEUTROABS -- -- 12.2*  HGB 11.3* 11.2* --  HCT 36.2 35.9* --  MCV 71.4* 70.5* --  PLT 516* 720* --   Cardiac Enzymes:  Basename 03/05/12 0415  CKTOTAL 62  CKMB 1.6  CKMBINDEX --  TROPONINI <0.30   BNP:  Basename 03/04/12  0510 03/03/12 1010  PROBNP 2374.0* 1500.0*   D-Dimer: No results found for this basename: DDIMER:2 in the last 72 hours CBG: No results found for this basename: GLUCAP:6 in the last 72 hours Hemoglobin A1C: No results found for this basename: HGBA1C in the last 72 hours Fasting Lipid Panel: No results found for this basename: CHOL,HDL,LDLCALC,TRIG,CHOLHDL,LDLDIRECT in the last 72 hours Thyroid Function Tests:  Basename 03/05/12 0415  TSH 1.218  T4TOTAL --  FREET4 1.92*  T3FREE 1.9*  THYROIDAB --   Anemia Panel: No results found for this basename: VITAMINB12,FOLATE,FERRITIN,TIBC,IRON,RETICCTPCT in the last 72 hours Coagulation: No results found for this basename: LABPROT:2,INR:2 in the last 72 hours Urine Drug Screen: Drugs of Abuse     Component Value Date/Time   LABOPIA POSITIVE* 03/03/2012 0132   COCAINSCRNUR NONE DETECTED 03/03/2012 0132   LABBENZ NONE DETECTED 03/03/2012 0132   AMPHETMU NONE DETECTED 03/03/2012 0132   THCU POSITIVE* 03/03/2012 0132   LABBARB NONE DETECTED 03/03/2012 0132    Alcohol Level: No results found for this basename: ETH:2 in the last 72 hours Urinalysis:  Basename 03/04/12 1241  COLORURINE YELLOW  LABSPEC 1.018  PHURINE 6.0  GLUCOSEU NEGATIVE  HGBUR NEGATIVE  BILIRUBINUR NEGATIVE  KETONESUR NEGATIVE  PROTEINUR 30*  UROBILINOGEN 0.2  NITRITE NEGATIVE  LEUKOCYTESUR NEGATIVE    Recent Results (from the past 240 hour(s))  MRSA PCR SCREENING  Status: Normal   Collection Time   03/03/12 12:33 AM      Component Value Range Status Comment   MRSA by PCR NEGATIVE  NEGATIVE  Final   CULTURE, BLOOD (ROUTINE X 2)     Status: Normal (Preliminary result)   Collection Time   03/03/12  1:20 AM      Component Value Range Status Comment   Specimen Description BLOOD LEFT HAND   Final    Special Requests BOTTLES DRAWN AEROBIC AND ANAEROBIC 3CC   Final    Culture  Setup Time [id number]   Final    Culture     Final    Value:        BLOOD CULTURE  RECEIVED NO GROWTH TO DATE CULTURE WILL BE HELD FOR 5 DAYS BEFORE ISSUING A FINAL NEGATIVE REPORT   Report Status PENDING   Incomplete     Studies/Results: Dg Chest 2 View  03/04/2012  *RADIOLOGY REPORT*  Clinical Data: Follow up pneumonia  CHEST - 2 VIEW  Comparison: 03/02/2012  Findings: Cardiomegaly again noted.  There is improved aeration without convincing pulmonary edema.  No focal infiltrate.  Stable mild degenerative changes thoracic spine.  IMPRESSION: Improvement in aeration.  No convincing pulmonary edema.  No focal infiltrate.  Original Report Authenticated By: Natasha Mead, M.D.    Medications: Scheduled Meds:    . amiodarone  200 mg Oral Daily  . amLODipine  10 mg Oral Daily  . aspirin  325 mg Oral Daily  . citalopram  20 mg Oral Daily  . levofloxacin  500 mg Oral Daily  . loratadine  10 mg Oral Daily  . metoCLOPramide (REGLAN) injection  5 mg Intravenous TID AC  . metoprolol      . metoprolol tartrate  75 mg Oral BID  . pantoprazole  40 mg Oral Q1200  . sodium chloride  3 mL Intravenous Q12H  . sodium chloride  3 mL Intravenous Q12H  . DISCONTD: furosemide  20 mg Oral Daily  . DISCONTD: levofloxacin (LEVAQUIN) IV  750 mg Intravenous Q24H  . DISCONTD: metoprolol tartrate  50 mg Oral BID   Continuous Infusions:    . DISCONTD: diltiazem (CARDIZEM) infusion 10 mg/hr (03/05/12 0615)   PRN Meds:.acetaminophen, acetaminophen, albuterol, ALPRAZolam, docusate sodium, labetalol, ondansetron (ZOFRAN) IV, ondansetron, traMADol  Assessment/Plan:  #1. Community-acquired pneumonia vs bronchitis - continue with Levaquin, Day 4/7. blood cultures negative #2. Acute on chronic diastolic CHF-improved, hold lasix today , given rise in BUN/creatinine continue metoprolol #3. Afib with RVR-HR controlled continue Amiodarone, metoprolol , not candidate for anticoagulation due to h/o GI bleeds  #4. Uncontrolled hypertension -  amlodipine, and continue PRN labetalol IV for systolic blood  pressure more than 160.  #5. Intractable nausea and vomiting: had extensive w/u with negative EGD/colonoscopy/HIDA in past, couldn't tolerate Gastric emptying scan last time, unfortunately will not be done over weekend Symptoms improved a little Add reglan Q AC Stop Ferrous fumarate #6. Anemia - stool for occult blood is negative. Closely follow CBC.  #7. Leukocytosis: check UA, she has chronic leukocytosis, may need a heme evaluation as outpt, remains afebrile CODE STATUS - full code.   LOS: 4 days   Daisee Centner [facility] Pager: [phone] 03/06/2012, 7:54 AM  Subjective:  still nauseous, very poor PO intake, Overnight afib with RVR  Objective: Vital signs in last 24 hours: Temp:  [97.6 F (36.4 C)-99.1 F (37.3 C)] 97.6 F (36.4 C) (04/13 0530) Pulse Rate:  [50-73] 50  (04/13 0530) Resp:  [  16-18] 16  (04/13 0530) BP: (101-149)/(64-85) 101/64 mmHg (04/13 0530) SpO2:  [98 %-100 %] 100 % (04/13 0530) Weight:  [76.9 kg (169 lb 8.5 oz)] 76.9 kg (169 lb 8.5 oz) (04/13 0530) Weight change: -0.8 kg (-1 lb 12.2 oz) Last BM Date: 03/02/12 (Pt states "skipping" is normal to her.)  Intake/Output from previous day: 04/12 0701 - 04/13 0700 In: 43 [I.V.:40; IV Piggyback:3] Out: 400 [Urine:400]   Physical Exam: General: Alert, awake, oriented x3, in no acute distress, drenching sweats HEENT: No bruits, no goiter. Heart: Irregular rate and rhythm, without murmurs, rubs, gallops. Lungs: Clear to auscultation bilaterally. Abdomen: Soft, nontender, nondistended, positive bowel sounds. Extremities: No clubbing cyanosis or edema with positive pedal pulses. Neuro: Grossly intact, nonfocal.   Lab Results: Basic Metabolic Panel:  Basename 03/06/12 0555 03/05/12 0415 03/03/12 1010  NA 134* 134* --  K 3.6 3.2* --  CL 97 98 --  CO2 21 22 --  GLUCOSE 103* 120* --  BUN 34* 23 --  CREATININE PENDING 1.33* --  CALCIUM PENDING 10.2 --  MG -- -- 1.9  PHOS -- -- --   Liver  Function Tests:  Basename 03/03/12 1010  AST 15  ALT 22  ALKPHOS 120*  BILITOT 0.4  PROT 9.5*  ALBUMIN 4.3   No results found for this basename: LIPASE:2,AMYLASE:2 in the last 72 hours No results found for this basename: AMMONIA:2 in the last 72 hours CBC:  Basename 03/06/12 0555 03/05/12 0415 03/03/12 1010  WBC 15.1* 18.5* --  NEUTROABS -- -- 12.2*  HGB 11.3* 11.2* --  HCT 36.2 35.9* --  MCV 71.4* 70.5* --  PLT 516* 720* --   Cardiac Enzymes:  Basename 03/05/12 0415  CKTOTAL 62  CKMB 1.6  CKMBINDEX --  TROPONINI <0.30   BNP:  Basename 03/04/12 0510 03/03/12 1010  PROBNP 2374.0* 1500.0*   D-Dimer: No results found for this basename: DDIMER:2 in the last 72 hours CBG: No results found for this basename: GLUCAP:6 in the last 72 hours Hemoglobin A1C: No results found for this basename: HGBA1C in the last 72 hours Fasting Lipid Panel: No results found for this basename: CHOL,HDL,LDLCALC,TRIG,CHOLHDL,LDLDIRECT in the last 72 hours Thyroid Function Tests:  Basename 03/05/12 0415  TSH 1.218  T4TOTAL --  FREET4 1.92*  T3FREE 1.9*  THYROIDAB --   Anemia Panel: No results found for this basename: VITAMINB12,FOLATE,FERRITIN,TIBC,IRON,RETICCTPCT in the last 72 hours Coagulation: No results found for this basename: LABPROT:2,INR:2 in the last 72 hours Urine Drug Screen: Drugs of Abuse     Component Value Date/Time   LABOPIA POSITIVE* 03/03/2012 0132   COCAINSCRNUR NONE DETECTED 03/03/2012 0132   LABBENZ NONE DETECTED 03/03/2012 0132   AMPHETMU NONE DETECTED 03/03/2012 0132   THCU POSITIVE* 03/03/2012 0132   LABBARB NONE DETECTED 03/03/2012 0132    Alcohol Level: No results found for this basename: ETH:2 in the last 72 hours Urinalysis:  Basename 03/04/12 1241  COLORURINE YELLOW  LABSPEC 1.018  PHURINE 6.0  GLUCOSEU NEGATIVE  HGBUR NEGATIVE  BILIRUBINUR NEGATIVE  KETONESUR NEGATIVE  PROTEINUR 30*  UROBILINOGEN 0.2  NITRITE NEGATIVE  LEUKOCYTESUR NEGATIVE     Recent Results (from the past 240 hour(s))  MRSA PCR SCREENING     Status: Normal   Collection Time   03/03/12 12:33 AM      Component Value Range Status Comment   MRSA by PCR NEGATIVE  NEGATIVE  Final   CULTURE, BLOOD (ROUTINE X 2)     Status: Normal (Preliminary result)  Collection Time   03/03/12  1:20 AM      Component Value Range Status Comment   Specimen Description BLOOD LEFT HAND   Final    Special Requests BOTTLES DRAWN AEROBIC AND ANAEROBIC 3CC   Final    Culture  Setup Time [id number]   Final    Culture     Final    Value:        BLOOD CULTURE RECEIVED NO GROWTH TO DATE CULTURE WILL BE HELD FOR 5 DAYS BEFORE ISSUING A FINAL NEGATIVE REPORT   Report Status PENDING   Incomplete     Studies/Results: Dg Chest 2 View  03/04/2012  *RADIOLOGY REPORT*  Clinical Data: Follow up pneumonia  CHEST - 2 VIEW  Comparison: 03/02/2012  Findings: Cardiomegaly again noted.  There is improved aeration without convincing pulmonary edema.  No focal infiltrate.  Stable mild degenerative changes thoracic spine.  IMPRESSION: Improvement in aeration.  No convincing pulmonary edema.  No focal infiltrate.  Original Report Authenticated By: Natasha Mead, M.D.    Medications: Scheduled Meds:    . amiodarone  200 mg Oral Daily  . amLODipine  10 mg Oral Daily  . aspirin  325 mg Oral Daily  . citalopram  20 mg Oral Daily  . levofloxacin  500 mg Oral Daily  . loratadine  10 mg Oral Daily  . metoCLOPramide (REGLAN) injection  5 mg Intravenous TID AC  . metoprolol      . metoprolol tartrate  75 mg Oral BID  . pantoprazole  40 mg Oral Q1200  . sodium chloride  3 mL Intravenous Q12H  . sodium chloride  3 mL Intravenous Q12H  . DISCONTD: furosemide  20 mg Oral Daily  . DISCONTD: levofloxacin (LEVAQUIN) IV  750 mg Intravenous Q24H  . DISCONTD: metoprolol tartrate  50 mg Oral BID   Continuous Infusions:    . DISCONTD: diltiazem (CARDIZEM) infusion 10 mg/hr (03/05/12 0615)   PRN  Meds:.acetaminophen, acetaminophen, albuterol, ALPRAZolam, docusate sodium, labetalol, ondansetron (ZOFRAN) IV, ondansetron, traMADol  Assessment/Plan:  #1. Community-acquired pneumonia vs bronchitis - continue with Levaquin, Day 3/7. blood cultures negative #2. Acute on chronic diastolic CHF-improved, continue PO lasix continue metoprolol #3. Afib with RVR-continue Amiodarone, increase metoprolol dose, not candidate for anticoagulation due to h/o GI bleeds Wean diltiazem gtt today #4. Uncontrolled hypertension - hold lisinopril, add amlodipine, and continue PRN labetalol IV for systolic blood pressure more than 160.  #5. Intractable nausea and vomiting: had extensive w/u with negative EGD/colonoscopy/HIDA in past, couldn't tolerate Gastric emptying scan last time, re-attempt once HR better Add reglan Q AC Stop Ferrous fumarate #6. Anemia - stool for occult blood is negative. Closely follow CBC.  #7. Leukocytosis: check UA, she has chronic leukocytosis, may need a heme evaluation as outpt, remains afebrile CODE STATUS - full code.   LOS: 4 days   [facility] Pager: [phone] 03/06/2012, 7:54 AM

## 2012-03-06 NOTE — Progress Notes (Signed)
ANTIBIOTIC CONSULT NOTE - follow up  Pharmacy Consult for levofloxacin Indication: pneumonia  Allergies  Allergen Reactions  . Penicillins Swelling    Patient Measurements: Height: 5\' 8"  (172.7 cm) Weight: 169 lb 8.5 oz (76.9 kg) IBW/kg (Calculated) : 63.9  Adjusted Body Weight:   Vital Signs: Temp: 97.6 F (36.4 C) (04/13 0530) Temp src: Oral (04/13 0530) BP: 101/64 mmHg (04/13 0530) Pulse Rate: 50  (04/13 0530) Intake/Output from previous day: 04/12 0701 - 04/13 0700 In: 43 [I.V.:40; IV Piggyback:3] Out: 400 [Urine:400] Intake/Output from this shift:    Labs:  Basename 03/06/12 0555 03/05/12 0415 03/04/12 0510  WBC 15.1* 18.5* 19.8*  HGB 11.3* 11.2* 10.5*  PLT 516* 720* 737*  LABCREA -- -- --  CREATININE 2.63* 1.33* 1.17*   Estimated Creatinine Clearance: 24.5 ml/min (by C-G formula based on Cr of 2.63). No results found for this basename: VANCOTROUGH:2,VANCOPEAK:2,VANCORANDOM:2,GENTTROUGH:2,GENTPEAK:2,GENTRANDOM:2,TOBRATROUGH:2,TOBRAPEAK:2,TOBRARND:2,AMIKACINPEAK:2,AMIKACINTROU:2,AMIKACIN:2, in the last 72 hours   Microbiology: Recent Results (from the past 720 hour(s))  MRSA PCR SCREENING     Status: Normal   Collection Time   03/03/12 12:33 AM      Component Value Range Status Comment   MRSA by PCR NEGATIVE  NEGATIVE  Final   CULTURE, BLOOD (ROUTINE X 2)     Status: Normal (Preliminary result)   Collection Time   03/03/12  1:20 AM      Component Value Range Status Comment   Specimen Description BLOOD LEFT HAND   Final    Special Requests BOTTLES DRAWN AEROBIC AND ANAEROBIC 3CC   Final    Culture  Setup Time [id number]   Final    Culture     Final    Value:        BLOOD CULTURE RECEIVED NO GROWTH TO DATE CULTURE WILL BE HELD FOR 5 DAYS BEFORE ISSUING A FINAL NEGATIVE REPORT   Report Status PENDING   Incomplete     Medical History: Past Medical History  Diagnosis Date  . Hypertension   . Borderline diabetes   . Irregular heart beat   .  Hyperlipidemia     Medications:  Anti-infectives     Start     Dose/Rate Route Frequency Ordered Stop   03/05/12 1000   levofloxacin (LEVAQUIN) tablet 500 mg        500 mg Oral Daily 03/05/12 0833     03/04/12 0100   levofloxacin (LEVAQUIN) IVPB 750 mg  Status:  Discontinued        750 mg 100 mL/hr over 90 Minutes Intravenous Every 24 hours 03/03/12 1332 03/05/12 0833   03/03/12 0030   levofloxacin (LEVAQUIN) IVPB 750 mg  Status:  Discontinued        750 mg 100 mL/hr over 90 Minutes Intravenous Every 48 hours 03/03/12 0017 03/03/12 1332   03/02/12 2145   moxifloxacin (AVELOX) IVPB 400 mg        400 mg 250 mL/hr over 60 Minutes Intravenous  Once 03/02/12 2136 03/02/12 2309         Assessment: Day 4 of 7 Levaquin for CAP Worsening renal function Slightly improved WBC  Goal of Therapy:  Levofloxacin dosed based on patient weight and renal function   Plan:  Change Levaquin to 250mg  PO daily due to CrCl 20-49 ml/min   Hessie Knows, PharmD, BCPS Pager [phone] 03/06/2012 10:32 AM

## 2012-03-07 LAB — GLUCOSE, CAPILLARY: Glucose-Capillary: 100 mg/dL — ABNORMAL HIGH (ref 70–99)

## 2012-03-07 LAB — BASIC METABOLIC PANEL
BUN: 40 mg/dL — ABNORMAL HIGH (ref 6–23)
GFR calc Af Amer: 21 mL/min — ABNORMAL LOW (ref >90)
GFR calc non Af Amer: 18 mL/min — ABNORMAL LOW (ref >90)
Potassium: 4.1 mEq/L (ref 3.5–5.1)
Sodium: 135 mEq/L (ref 135–145)

## 2012-03-07 LAB — LACTIC ACID, PLASMA: Lactic Acid, Venous: 1.4 mmol/L (ref 0.5–2.2)

## 2012-03-07 LAB — PROCALCITONIN: Procalcitonin: <0.1 ng/mL

## 2012-03-07 MED ORDER — SODIUM CHLORIDE 0.9 % IV BOLUS (SEPSIS)
500.0000 mL | Freq: Once | INTRAVENOUS | Status: AC
  Administered 2012-03-07: 500 mL via INTRAVENOUS

## 2012-03-07 MED ORDER — SODIUM CHLORIDE 0.9 % IV SOLN
INTRAVENOUS | Status: DC
  Administered 2012-03-07: 08:00:00 via INTRAVENOUS
  Administered 2012-03-08: 100 mL/h via INTRAVENOUS
  Administered 2012-03-08 – 2012-03-09 (×2): via INTRAVENOUS

## 2012-03-07 NOTE — Progress Notes (Signed)
Pt converted to A-fib, HR=67. Pt resting quietly, no complaints.

## 2012-03-07 NOTE — Progress Notes (Signed)
Pt out of bed to obtain am weight x1 staff assist. Pt stood on scale and upon exiting scale, pt fell to floor. Pt states she does not recall falling, has no c/o pain & no injuries. Benedetto Coons, NP paged for notification. NP returned call promptly with orders received.  VS recorded on flowsheet, noting significant hypotension. Pt request family not to be notified at this time. Fall huddle completed with [facility] @ bedside. 500cc NS bolus initiated. Emotional support provided to pt.

## 2012-03-07 NOTE — Progress Notes (Signed)
Assumed care for patient at 0715 after patient had fall this am. Pt given 2-IVF bolus NS. Pt remains hypotensive SBP 70-80's MD aware. Pt now receiving NS @ 100cc/hr. Another IV attempted to be placed as per MD request. Attempt unsuccessful. Pt to get PICC line. Pt remain asymptomatic at this time. Pt ordered breakfast and remains on bedrest at this time. Will continue to monitor.

## 2012-03-07 NOTE — Progress Notes (Signed)
Subjective: Stood up today and fell down, BP dropped No vomiting /diarrhea  Objective: Vital signs in last 24 hours: Temp:  [97.7 F (36.5 C)-98.1 F (36.7 C)] 98.1 F (36.7 C) (04/14 4098) Pulse Rate:  [51-77] 77  (04/14 0647) Resp:  [20] 20  (04/14 0608) BP: (71-147)/(46-84) 83/64 mmHg (04/14 0652) SpO2:  [97 %-100 %] 99 % (04/14 0608) Weight:  [77.1 kg (169 lb 15.6 oz)] 77.1 kg (169 lb 15.6 oz) (04/14 1191) Weight change: 0.2 kg (7.1 oz) Last BM Date: 03/02/12  Intake/Output from previous day: 04/13 0701 - 04/14 0700 In: 363 [P.O.:360; I.V.:3] Out: 300 [Urine:300]   Physical Exam: General: Alert, awake, oriented x3, in no acute distress, drenching sweats HEENT: No bruits, no goiter. Heart: Irregular rate and rhythm, without murmurs, rubs, gallops. Lungs: Clear to auscultation bilaterally. Abdomen: Soft, nontender, nondistended, positive bowel sounds. Extremities: No clubbing cyanosis or edema with positive pedal pulses. Neuro: Grossly intact, nonfocal.   Lab Results: Basic Metabolic Panel:  Basename 03/06/12 0555 03/05/12 0415  NA 134* 134*  K 3.6 3.2*  CL 97 98  CO2 21 22  GLUCOSE 103* 120*  BUN 34* 23  CREATININE 2.63* 1.33*  CALCIUM 10.1 10.2  MG -- --  PHOS -- --   Liver Function Tests: No results found for this basename: AST:2,ALT:2,ALKPHOS:2,BILITOT:2,PROT:2,ALBUMIN:2 in the last 72 hours No results found for this basename: LIPASE:2,AMYLASE:2 in the last 72 hours No results found for this basename: AMMONIA:2 in the last 72 hours CBC:  Basename 03/06/12 0555 03/05/12 0415  WBC 15.1* 18.5*  NEUTROABS -- --  HGB 11.3* 11.2*  HCT 36.2 35.9*  MCV 71.4* 70.5*  PLT 516* 720*   Cardiac Enzymes:  Basename 03/05/12 0415  CKTOTAL 62  CKMB 1.6  CKMBINDEX --  TROPONINI <0.30   BNP: No results found for this basename: PROBNP:3 in the last 72 hours D-Dimer: No results found for this basename: DDIMER:2 in the last 72 hours CBG: No results found for  this basename: GLUCAP:6 in the last 72 hours Hemoglobin A1C: No results found for this basename: HGBA1C in the last 72 hours Fasting Lipid Panel: No results found for this basename: CHOL,HDL,LDLCALC,TRIG,CHOLHDL,LDLDIRECT in the last 72 hours Thyroid Function Tests:  Basename 03/05/12 0415  TSH 1.218  T4TOTAL --  FREET4 1.92*  T3FREE 1.9*  THYROIDAB --   Anemia Panel: No results found for this basename: VITAMINB12,FOLATE,FERRITIN,TIBC,IRON,RETICCTPCT in the last 72 hours Coagulation: No results found for this basename: LABPROT:2,INR:2 in the last 72 hours Urine Drug Screen: Drugs of Abuse     Component Value Date/Time   LABOPIA POSITIVE* 03/03/2012 0132   COCAINSCRNUR NONE DETECTED 03/03/2012 0132   LABBENZ NONE DETECTED 03/03/2012 0132   AMPHETMU NONE DETECTED 03/03/2012 0132   THCU POSITIVE* 03/03/2012 0132   LABBARB NONE DETECTED 03/03/2012 0132    Alcohol Level: No results found for this basename: ETH:2 in the last 72 hours Urinalysis:  Basename 03/04/12 1241  COLORURINE YELLOW  LABSPEC 1.018  PHURINE 6.0  GLUCOSEU NEGATIVE  HGBUR NEGATIVE  BILIRUBINUR NEGATIVE  KETONESUR NEGATIVE  PROTEINUR 30*  UROBILINOGEN 0.2  NITRITE NEGATIVE  LEUKOCYTESUR NEGATIVE    Recent Results (from the past 240 hour(s))  MRSA PCR SCREENING     Status: Normal   Collection Time   03/03/12 12:33 AM      Component Value Range Status Comment   MRSA by PCR NEGATIVE  NEGATIVE  Final   CULTURE, BLOOD (ROUTINE X 2)     Status: Normal (  Preliminary result)   Collection Time   03/03/12  1:20 AM      Component Value Range Status Comment   Specimen Description BLOOD LEFT HAND   Final    Special Requests BOTTLES DRAWN AEROBIC AND ANAEROBIC 3CC   Final    Culture  Setup Time [id number]   Final    Culture     Final    Value:        BLOOD CULTURE RECEIVED NO GROWTH TO DATE CULTURE WILL BE HELD FOR 5 DAYS BEFORE ISSUING A FINAL NEGATIVE REPORT   Report Status PENDING   Incomplete      Studies/Results: No results found.  Medications: Scheduled Meds:    . amiodarone  200 mg Oral Daily  . aspirin  325 mg Oral Daily  . citalopram  20 mg Oral Daily  . levofloxacin  250 mg Oral Daily  . loratadine  10 mg Oral Daily  . metoCLOPramide (REGLAN) injection  5 mg Intravenous TID AC  . metoprolol tartrate  25 mg Oral BID  . pantoprazole  40 mg Oral Q1200  . polyethylene glycol  17 g Oral Daily  . sodium chloride  500 mL Intravenous Once  . sodium chloride  500 mL Intravenous Once  . sodium chloride  3 mL Intravenous Q12H  . sodium chloride  3 mL Intravenous Q12H  . DISCONTD: amLODipine  10 mg Oral Daily  . DISCONTD: levofloxacin  500 mg Oral Daily  . DISCONTD: metoprolol tartrate  75 mg Oral BID   Continuous Infusions:    . sodium chloride     PRN Meds:.acetaminophen, acetaminophen, albuterol, ALPRAZolam, docusate sodium, labetalol, ondansetron (ZOFRAN) IV, ondansetron, traMADol  Assessment/Plan: #1. Hypotension: suspect secondary to overdiuresis, check Lactic acid/procalcitonin level, cultures from admission negative so far #2. ARF : ATN from hypotension and diuresis, replace volume and strict I/Os, bmet in am #3. Community-acquired pneumonia vs bronchitis - continue with Levaquin, Day 5/7. blood cultures negative #4. Acute on chronic diastolic CHF-improved, lasix on hold , given rise in BUN/creatinine continue metoprolol #5. Afib with RVR-HR controlled continue Amiodarone, metoprolol , not candidate for anticoagulation due to h/o GI bleeds  #6. Intractable nausea and vomiting: had extensive w/u with negative EGD/colonoscopy/HIDA in past, couldn't tolerate Gastric emptying scan last time, unfortunately will not be done over weekend Symptoms improved a little Add reglan Q AC Stop Ferrous fumarate #7. Anemia - stool for occult blood is negative. Closely follow CBC.  #8. Leukocytosis: check UA, she has chronic leukocytosis, may need a heme evaluation as outpt,  remains afebrile CODE STATUS - full code.   LOS: 5 days   [facility] Pager: [phone] 03/07/2012, 8:14 AM

## 2012-03-08 ENCOUNTER — Inpatient Hospital Stay (HOSPITAL_COMMUNITY): Payer: Medicare Other

## 2012-03-08 LAB — BASIC METABOLIC PANEL
Chloride: 102 mEq/L (ref 96–112)
Creatinine, Ser: 2.63 mg/dL — ABNORMAL HIGH (ref 0.50–1.10)
GFR calc Af Amer: 21 mL/min — ABNORMAL LOW (ref >90)
Potassium: 2.9 mEq/L — ABNORMAL LOW (ref 3.5–5.1)

## 2012-03-08 MED ORDER — LEVOFLOXACIN 750 MG PO TABS
750.0000 mg | ORAL_TABLET | ORAL | Status: AC
  Administered 2012-03-08: 750 mg via ORAL
  Filled 2012-03-08: qty 1

## 2012-03-08 MED ORDER — TECHNETIUM TC 99M SULFUR COLLOID
2.2000 | Freq: Once | INTRAVENOUS | Status: AC | PRN
  Administered 2012-03-08: 2.2 via INTRAVENOUS

## 2012-03-08 MED ORDER — COSYNTROPIN 0.25 MG IJ SOLR: 0.2500 mg | Freq: Once | INTRAMUSCULAR | Status: DC

## 2012-03-08 MED ORDER — POTASSIUM CHLORIDE 10 MEQ/100ML IV SOLN
10.0000 meq | INTRAVENOUS | Status: AC
  Administered 2012-03-08 (×2): 10 meq via INTRAVENOUS
  Filled 2012-03-08 (×5): qty 100

## 2012-03-08 MED ORDER — POLYETHYLENE GLYCOL 3350 17 G PO PACK
17.0000 g | PACK | Freq: Every day | ORAL | Status: DC | PRN
  Filled 2012-03-08: qty 1

## 2012-03-08 MED ORDER — COSYNTROPIN 0.25 MG IJ SOLR
0.2500 mg | Freq: Once | INTRAMUSCULAR | Status: AC
  Administered 2012-03-09: 0.25 mg via INTRAVENOUS
  Filled 2012-03-08 (×2): qty 0.25

## 2012-03-08 MED ORDER — POTASSIUM CHLORIDE CRYS ER 10 MEQ PO TBCR
30.0000 meq | EXTENDED_RELEASE_TABLET | Freq: Once | ORAL | Status: AC
  Administered 2012-03-08: 30 meq via ORAL
  Filled 2012-03-08: qty 1

## 2012-03-08 MED ORDER — POTASSIUM CHLORIDE 10 MEQ/100ML IV SOLN
10.0000 meq | INTRAVENOUS | Status: DC
  Filled 2012-03-08 (×3): qty 100

## 2012-03-08 NOTE — Progress Notes (Addendum)
Subjective: Feels better, no further nausea/vomting  Objective: Vital signs in last 24 hours: Temp:  [97.5 F (36.4 C)-98 F (36.7 C)] 98 F (36.7 C) (04/15 0440) Pulse Rate:  [48-53] 53  (04/15 0440) Resp:  [16-20] 20  (04/15 0440) BP: (76-104)/(46-68) 98/58 mmHg (04/15 0440) SpO2:  [97 %-99 %] 99 % (04/15 0440) Weight:  [81.103 kg (178 lb 12.8 oz)] 81.103 kg (178 lb 12.8 oz) (04/15 0440) Weight change: 4.003 kg (8 lb 13.2 oz) Last BM Date: 03/02/12  Intake/Output from previous day: 04/14 0701 - 04/15 0700 In: 1800 [P.O.:600; I.V.:1200] Out: 400 [Urine:400]   Physical Exam: General: Alert, awake, oriented x3, in no acute distress HEENT: No bruits, no goiter. Heart: Irregular rate and rhythm, without murmurs, rubs, gallops. Lungs: Clear to auscultation bilaterally. Abdomen: Soft, nontender, nondistended, positive bowel sounds. Extremities: No clubbing cyanosis or edema with positive pedal pulses. Neuro: Grossly intact, nonfocal.   Lab Results: Basic Metabolic Panel:  Basename 03/08/12 0440 03/07/12 1400  NA 134* 135  K 2.9* 4.1  CL 102 101  CO2 21 19  GLUCOSE 98 111*  BUN 44* 40*  CREATININE 2.63* 2.72*  CALCIUM 8.8 9.0  MG -- --  PHOS -- --   Liver Function Tests: No results found for this basename: AST:2,ALT:2,ALKPHOS:2,BILITOT:2,PROT:2,ALBUMIN:2 in the last 72 hours No results found for this basename: LIPASE:2,AMYLASE:2 in the last 72 hours No results found for this basename: AMMONIA:2 in the last 72 hours CBC:  Basename 03/06/12 0555  WBC 15.1*  NEUTROABS --  HGB 11.3*  HCT 36.2  MCV 71.4*  PLT 516*   Cardiac Enzymes: No results found for this basename: CKTOTAL:3,CKMB:3,CKMBINDEX:3,TROPONINI:3 in the last 72 hours BNP: No results found for this basename: PROBNP:3 in the last 72 hours D-Dimer: No results found for this basename: DDIMER:2 in the last 72 hours CBG:  Basename 03/07/12 2140  GLUCAP 100*   Hemoglobin A1C: No results found for  this basename: HGBA1C in the last 72 hours Fasting Lipid Panel: No results found for this basename: CHOL,HDL,LDLCALC,TRIG,CHOLHDL,LDLDIRECT in the last 72 hours Thyroid Function Tests: No results found for this basename: TSH,T4TOTAL,FREET4,T3FREE,THYROIDAB in the last 72 hours Anemia Panel: No results found for this basename: VITAMINB12,FOLATE,FERRITIN,TIBC,IRON,RETICCTPCT in the last 72 hours Coagulation: No results found for this basename: LABPROT:2,INR:2 in the last 72 hours Urine Drug Screen: Drugs of Abuse     Component Value Date/Time   LABOPIA POSITIVE* 03/03/2012 0132   COCAINSCRNUR NONE DETECTED 03/03/2012 0132   LABBENZ NONE DETECTED 03/03/2012 0132   AMPHETMU NONE DETECTED 03/03/2012 0132   THCU POSITIVE* 03/03/2012 0132   LABBARB NONE DETECTED 03/03/2012 0132    Alcohol Level: No results found for this basename: ETH:2 in the last 72 hours Urinalysis: No results found for this basename: COLORURINE:2,APPERANCEUR:2,LABSPEC:2,PHURINE:2,GLUCOSEU:2,HGBUR:2,BILIRUBINUR:2,KETONESUR:2,PROTEINUR:2,UROBILINOGEN:2,NITRITE:2,LEUKOCYTESUR:2 in the last 72 hours  Recent Results (from the past 240 hour(s))  MRSA PCR SCREENING     Status: Normal   Collection Time   03/03/12 12:33 AM      Component Value Range Status Comment   MRSA by PCR NEGATIVE  NEGATIVE  Final   CULTURE, BLOOD (ROUTINE X 2)     Status: Normal (Preliminary result)   Collection Time   03/03/12  1:20 AM      Component Value Range Status Comment   Specimen Description BLOOD LEFT HAND   Final    Special Requests BOTTLES DRAWN AEROBIC AND ANAEROBIC [facility]   Final    Culture  Setup Time [id number]   Final  Culture     Final    Value:        BLOOD CULTURE RECEIVED NO GROWTH TO DATE CULTURE WILL BE HELD FOR 5 DAYS BEFORE ISSUING A FINAL NEGATIVE REPORT   Report Status PENDING   Incomplete     Studies/Results: No results found.  Medications: Scheduled Meds:    . amiodarone  200 mg Oral Daily  . aspirin  325 mg Oral  Daily  . citalopram  20 mg Oral Daily  . levofloxacin  250 mg Oral Daily  . loratadine  10 mg Oral Daily  . metoCLOPramide (REGLAN) injection  5 mg Intravenous TID AC  . pantoprazole  40 mg Oral Q1200  . sodium chloride  500 mL Intravenous Once  . sodium chloride  3 mL Intravenous Q12H  . sodium chloride  3 mL Intravenous Q12H  . DISCONTD: amLODipine  10 mg Oral Daily  . DISCONTD: metoprolol tartrate  25 mg Oral BID  . DISCONTD: polyethylene glycol  17 g Oral Daily   Continuous Infusions:    . sodium chloride 100 mL/hr at 03/08/12 0700   PRN Meds:.acetaminophen, acetaminophen, albuterol, ALPRAZolam, docusate sodium, ondansetron (ZOFRAN) IV, ondansetron, polyethylene glycol, traMADol, DISCONTD: labetalol  Assessment/Plan: #1. Hypotension: improving, suspect secondary to overdiuresis,  Lactic acid/procalcitonin level normal, cultures from admission negative so far, Continue IVF today, strict I/Os, random cortisol level is low, will order ACTH stim test #2. ARF : ATN from hypotension and diuresis, replace volume Ns at 100cc/hr, Renal USG and strict I/Os, bmet in am #3. Community-acquired pneumonia vs bronchitis - continue with Levaquin, Day 6/7. blood cultures negative #4. Acute on chronic diastolic CHF-improved, lasix on hold , given rise in BUN/creatinine Metoprolol on hold due to BP being soft #5. Afib with RVR-HR controlled continue Amiodarone, metoprolol on hold , not candidate for anticoagulation due to h/o GI bleeds #6. Intractable nausea and vomiting: had extensive w/u with negative EGD/colonoscopy/HIDA in past, couldn't tolerate Gastric emptying scan last time, repeat gastric emptying scan negative Symptoms improved Add reglan Q AC Stop Ferrous fumarate #7. Anemia - stool for occult blood is negative. Closely follow CBC.  #8. Leukocytosis: she has chronic leukocytosis, may need a heme evaluation as outpt, remains afebrile CODE STATUS - full code.   LOS: 6 days    [facility] Pager: [phone] 03/08/2012, 8:00 AM

## 2012-03-08 NOTE — Progress Notes (Signed)
ANTIBIOTIC CONSULT NOTE - follow up  Pharmacy Consult for levofloxacin Indication: pneumonia  Allergies  Allergen Reactions  . Penicillins Swelling    Patient Measurements: Height: 5\' 8"  (172.7 cm) Weight: 178 lb 12.8 oz (81.103 kg) IBW/kg (Calculated) : 63.9   Vital Signs: Temp: 98 F (36.7 C) (04/15 0440) Temp src: Oral (04/15 0440) BP: 98/58 mmHg (04/15 0440) Pulse Rate: 53  (04/15 0440) Intake/Output from previous day: 04/14 0701 - 04/15 0700 In: 1800 [P.O.:600; I.V.:1200] Out: 400 [Urine:400] Intake/Output from this shift:    Labs:  Basename 03/08/12 0440 03/07/12 1400 03/06/12 0555  WBC -- -- 15.1*  HGB -- -- 11.3*  PLT -- -- 516*  LABCREA -- -- --  CREATININE 2.63* 2.72* 2.63*   Estimated Creatinine Clearance: 25.1 ml/min (by C-G formula based on Cr of 2.63).    Microbiology: Recent Results (from the past 720 hour(s))  MRSA PCR SCREENING     Status: Normal   Collection Time   03/03/12 12:33 AM      Component Value Range Status Comment   MRSA by PCR NEGATIVE  NEGATIVE  Final   CULTURE, BLOOD (ROUTINE X 2)     Status: Normal (Preliminary result)   Collection Time   03/03/12  1:20 AM      Component Value Range Status Comment   Specimen Description BLOOD LEFT HAND   Final    Special Requests BOTTLES DRAWN AEROBIC AND ANAEROBIC 3CC   Final    Culture  Setup Time [id number]   Final    Culture     Final    Value:        BLOOD CULTURE RECEIVED NO GROWTH TO DATE CULTURE WILL BE HELD FOR 5 DAYS BEFORE ISSUING A FINAL NEGATIVE REPORT   Report Status PENDING   Incomplete     Medical History: Past Medical History  Diagnosis Date  . Hypertension   . Borderline diabetes   . Irregular heart beat   . Hyperlipidemia     Assessment: Day 6/7 Levaquin for CAP Worsening renal function Slightly improved WBC  Goal of Therapy:  Levofloxacin dosed based on patient weight and renal function   Plan:  Levaquin 750mg  PO q48h for CrCl~25 ml/min.  Will receive dose  today and will be last dose for 7 day treatment.  Clance Boll, PharmD, BCPS Pager: [phone] 03/08/2012 8:36 AM

## 2012-03-09 LAB — BASIC METABOLIC PANEL
Calcium: 8.7 mg/dL (ref 8.4–10.5)
Creatinine, Ser: 1.8 mg/dL — ABNORMAL HIGH (ref 0.50–1.10)
GFR calc non Af Amer: 29 mL/min — ABNORMAL LOW (ref >90)
Glucose, Bld: 88 mg/dL (ref 70–99)
Sodium: 137 mEq/L (ref 135–145)

## 2012-03-09 LAB — CBC
MCH: 21.8 pg — ABNORMAL LOW (ref 26.0–34.0)
MCV: 72.2 fL — ABNORMAL LOW (ref 78.0–100.0)
Platelets: 478 10*3/uL — ABNORMAL HIGH (ref 150–400)
RBC: 3.81 MIL/uL — ABNORMAL LOW (ref 3.87–5.11)
RDW: 18.2 % — ABNORMAL HIGH (ref 11.5–15.5)
WBC: 11.9 10*3/uL — ABNORMAL HIGH (ref 4.0–10.5)

## 2012-03-09 LAB — CULTURE, BLOOD (ROUTINE X 2): Culture: NO GROWTH

## 2012-03-09 MED ORDER — METOPROLOL TARTRATE 25 MG PO TABS: 12.5000 mg | ORAL_TABLET | Freq: Two times a day (BID) | ORAL | Status: DC

## 2012-03-09 MED ORDER — ALPRAZOLAM 0.5 MG PO TABS: 0.5000 mg | ORAL_TABLET | Freq: Three times a day (TID) | ORAL | Status: AC | PRN

## 2012-03-09 MED ORDER — FUROSEMIDE 40 MG PO TABS: 40.0000 mg | ORAL_TABLET | ORAL | Status: DC | PRN

## 2012-03-09 NOTE — Discharge Summary (Signed)
Physician Discharge Summary  Patient ID: [name] MRN: [id number] DOB/AGE: [birth year] 61 y.o.  Admit date: 03/02/2012 Discharge date: 03/09/2012  Primary Care Physician:  Georganna Skeans, MD, MD   Discharge Diagnoses:   1. Community acquired pneumonia 2. Hypotension- resolved 3. Acute Diastolic CHF 4. atrial fibrillation not on Coumadin 5. HYPERLIPIDEMIA 6. HYPERTENSION 7. nausea and vomiting resolved 8. chronic leukocytosis   Medication List  As of 03/09/2012  1:43 PM   STOP taking these medications         ferrous fumarate 50 MG CR tablet      lisinopril 10 MG tablet      meloxicam 7.5 MG tablet         TAKE these medications         ALPRAZolam 0.5 MG tablet   Commonly known as: XANAX   Take 1 tablet (0.5 mg total) by mouth 3 (three) times daily as needed for anxiety.      amiodarone 200 MG tablet   Commonly known as: PACERONE   Take 200 mg by mouth daily.      aspirin 325 MG EC tablet   Take 325 mg by mouth daily.      calcium carbonate 600 MG Tabs   Commonly known as: OS-CAL   Take 600 mg by mouth 2 (two) times daily with a meal.      citalopram 20 MG tablet   Commonly known as: CELEXA   Take 20 mg by mouth daily.      docusate sodium 100 MG capsule   Commonly known as: COLACE   Take 100 mg by mouth 2 (two) times daily as needed. For constipation      furosemide 40 MG tablet   Commonly known as: LASIX   Take 1 tablet (40 mg total) by mouth every other day as needed (for leg swelling, Sob with weight gain).      loratadine 10 MG tablet   Commonly known as: CLARITIN   Take 10 mg by mouth daily.      Metoclopramide HCl 5 MG Tbdp   Take 1 tablet by mouth 4 (four) times daily -  before meals and at bedtime.      metoprolol tartrate 25 MG tablet   Commonly known as: LOPRESSOR   Take 0.5 tablets (12.5 mg total) by mouth 2 (two) times daily.      potassium chloride SA 20 MEQ tablet   Commonly known as: K-DUR,KLOR-CON   Take 20 mEq by mouth  daily.      traMADol 50 MG tablet   Commonly known as: ULTRAM   Take 50 mg by mouth every 6 (six) hours as needed. For pain relief           Disposition and Follow-up:  PCP in 1 week  Significant Diagnostic Studies:  Nm Gastric Emptying 03/08/2012   IMPRESSION: 6% of ingested activity remained within the stomach at 120 minutes. This is within normal range of less than 30% of ingested activity remaining within the stomach at 120 minutes.  Original Report Authenticated By: Crawford Givens, M.D.   US Renal 03/08/2012  IMPRESSION: Normal urinary tract ultrasound.   2D echo:Study Conclusions - Left ventricle: The cavity size was normal. Wall thickness was increased in a pattern of mild LVH. Systolic function was normal. The estimated ejection fraction was in the range of 55% to 60%. Wall motion was normal; there were no regional wall motion abnormalities. - Aortic valve: Trivial regurgitation. - Left atrium: The atrium  was mildly dilated. - Tricuspid valve: Moderate regurgitation. - Pulmonary arteries: Systolic pressure was mildly increased. PA peak pressure: 32mm Hg (S). - Pericardium, extracardiac: A trivial pericardial effusion was identified.   Brief H and P: Chief Complaint: Fever chills with chest pain and shortness of breath.  HPI: 61 year old female with known history of hypertension, atrial fibrillation not a candidate for Coumadin secondary GI bleed on amiodarone presently in sinus rhythm, anemia, presented to the ER because of ongoing subjective feeling of fever chills and drenching sweats and cough productive of sputum and chest pain. Patient states his pain is retrosternal stabbing in nature and happens only on deep inspiration and has no exertional component. She's been having cough or productive last 2 days with some nausea and vomiting. Denies any abdominal pain or diarrhea. In the ER patient was found to be mildly febrile with leukocytosis and chest x-ray stitching  possibility of pneumonia. Patient in addition was found to be in drinking sweats. Patient has been [name] antibiotics for community acquired pneumonia.  Patient has anemia, for stool for occult blood was negative and patient states she did not have any bleeding per rectum  Hospital Course:  1. Community acquired pneumonia: Treated with levofloxacin, cultures negative, completed 7 day course of antibiotics and hence discontinued today. 2. Hypotension: Secondary to overdiuresis Upon admission she was also noted to have a pulmonary vascular congestion on x-ray and elevated BNP of 2300, hence started on IV Lasix for diuresis which she received for 2 days, after this she had a sudden drop in blood pressure, orthostatic hypotension and acute renal failure, she was ruled out for sepsis based on negative cultures, normal lactic acid and calcitonin levels and remained afebrile. Echocardiogram showed normal EF, and no wall motion abnormalities. Subsequently was rehydrated with IV fluids for 48 hours, her diuretics and ACE inhibitors were discontinued and since yesterday her blood pressure has remained stable. She is being restarted on a lower dose of her beta blocker, Lasix is changed to when necessary for now could be increased to daily if stable upon followup. 3. mild acute diastolic CHF as noted above 4. acute renal failure secondary to ATN from hypotension and overdiuresis. Her creatinine was 1.7 on admission and went up as high as 2.7, started improving now and is 1.8 the time of discharge today. Her ACE inhibitors have also been discontinued. 5. A. Fib, transiently had an episode of rapid ventricular response requiring diltiazem and when necessary metoprolol, then converted back to sinus rhythm and rate has remained normal with metoprolol only. 6. chronic intermittent nausea and vomiting: Patient unfortunately has had an extensive prior workup for this with a normal EGD and colonoscopy back in February,  2012. She was restarted on Reglan a.c. and symptoms significantly improved currently tolerating a regular diet. I also did a gastric emptying scan on her which is normal.  Time spent on Discharge:  Signed: Matisse Salais [facility]  03/09/2012, 1:43 PM

## 2012-03-09 NOTE — Progress Notes (Signed)
   CARE MANAGEMENT NOTE 03/09/2012  Patient:  [name], [name]   Account Number:  [id number]  Date Initiated:  03/03/2012  Documentation initiated by:  Lanier Clam  Subjective/Objective Assessment:   ADMITTED W/FEVER,CHILLS     Action/Plan:   FROM HOME W/SON.   Anticipated DC Date:  03/09/2012   Anticipated DC Plan:  HOME/SELF CARE         Choice offered to / List presented to:             Status of service:  Completed, signed off Medicare Important Message given?   (If response is "NO", the following Medicare IM given date fields will be blank) Date Medicare IM given:   Date Additional Medicare IM given:    Discharge Disposition:  HOME/SELF CARE  Per UR Regulation:  Reviewed for med. necessity/level of care/duration of stay  If discussed at Long Length of Stay Meetings, dates discussed:    Comments:  03/09/12 Joy Luedke RN,BSN NCM [phone] PCP APPT SET.SEE D/C F/U.HAS PHARMACY,TRANSP.AMBULATING IN RM.NO FURTHER D/C NEEDS.  03/03/12 Rigby Leonhardt RN,BSN NCM [phone]

## 2012-03-11 LAB — ACTH STIMULATION, 3 TIME POINTS: Cortisol, Base: 9.5 ug/dL

## 2012-05-24 ENCOUNTER — Emergency Department (HOSPITAL_COMMUNITY): Payer: Medicare Other

## 2012-05-24 ENCOUNTER — Encounter (HOSPITAL_COMMUNITY): Payer: Self-pay | Admitting: *Deleted

## 2012-05-24 ENCOUNTER — Inpatient Hospital Stay (HOSPITAL_COMMUNITY)
Admission: EM | Admit: 2012-05-24 | Discharge: 2012-05-26 | DRG: 811 | Disposition: A | Discharge status: Home / Self Care | Payer: Medicare Other | Attending: Internal Medicine | Admitting: Internal Medicine

## 2012-05-24 DIAGNOSIS — I509 Heart failure, unspecified: Secondary | ICD-10-CM | POA: Diagnosis not present

## 2012-05-24 DIAGNOSIS — Z8679 Personal history of other diseases of the circulatory system: Secondary | ICD-10-CM

## 2012-05-24 DIAGNOSIS — I5033 Acute on chronic diastolic (congestive) heart failure: Secondary | ICD-10-CM

## 2012-05-24 DIAGNOSIS — E785 Hyperlipidemia, unspecified: Secondary | ICD-10-CM

## 2012-05-24 DIAGNOSIS — J189 Pneumonia, unspecified organism: Secondary | ICD-10-CM

## 2012-05-24 DIAGNOSIS — F3289 Other specified depressive episodes: Secondary | ICD-10-CM

## 2012-05-24 DIAGNOSIS — M7989 Other specified soft tissue disorders: Secondary | ICD-10-CM | POA: Diagnosis present

## 2012-05-24 DIAGNOSIS — F17200 Nicotine dependence, unspecified, uncomplicated: Secondary | ICD-10-CM

## 2012-05-24 DIAGNOSIS — D509 Iron deficiency anemia, unspecified: Principal | ICD-10-CM | POA: Diagnosis present

## 2012-05-24 DIAGNOSIS — D649 Anemia, unspecified: Secondary | ICD-10-CM | POA: Diagnosis not present

## 2012-05-24 DIAGNOSIS — I10 Essential (primary) hypertension: Secondary | ICD-10-CM

## 2012-05-24 DIAGNOSIS — K644 Residual hemorrhoidal skin tags: Secondary | ICD-10-CM

## 2012-05-24 DIAGNOSIS — F329 Major depressive disorder, single episode, unspecified: Secondary | ICD-10-CM

## 2012-05-24 DIAGNOSIS — D72829 Elevated white blood cell count, unspecified: Secondary | ICD-10-CM

## 2012-05-24 DIAGNOSIS — M109 Gout, unspecified: Secondary | ICD-10-CM

## 2012-05-24 DIAGNOSIS — I4891 Unspecified atrial fibrillation: Secondary | ICD-10-CM | POA: Diagnosis not present

## 2012-05-24 DIAGNOSIS — F209 Schizophrenia, unspecified: Secondary | ICD-10-CM

## 2012-05-24 DIAGNOSIS — J811 Chronic pulmonary edema: Secondary | ICD-10-CM

## 2012-05-24 DIAGNOSIS — Z9189 Other specified personal risk factors, not elsewhere classified: Secondary | ICD-10-CM

## 2012-05-24 LAB — CBC WITH DIFFERENTIAL/PLATELET
Basophils Absolute: 0.2 10*3/uL — ABNORMAL HIGH (ref 0.0–0.1)
Eosinophils Absolute: 0.5 10*3/uL (ref 0.0–0.7)
HCT: 23.8 % — ABNORMAL LOW (ref 36.0–46.0)
Lymphs Abs: 2.1 10*3/uL (ref 0.7–4.0)
MCHC: 28.2 g/dL — ABNORMAL LOW (ref 30.0–36.0)
MCV: 66.9 fL — ABNORMAL LOW (ref 78.0–100.0)
Monocytes Absolute: 0.8 10*3/uL (ref 0.1–1.0)
Monocytes Relative: 5 % (ref 3–12)
Neutro Abs: 12.3 10*3/uL — ABNORMAL HIGH (ref 1.7–7.7)
Platelets: 651 10*3/uL — ABNORMAL HIGH (ref 150–400)
RDW: 18.2 % — ABNORMAL HIGH (ref 11.5–15.5)
WBC: 15.9 10*3/uL — ABNORMAL HIGH (ref 4.0–10.5)

## 2012-05-24 LAB — BASIC METABOLIC PANEL
BUN: 21 mg/dL (ref 6–23)
Calcium: 9 mg/dL (ref 8.4–10.5)
Creatinine, Ser: 1.34 mg/dL — ABNORMAL HIGH (ref 0.50–1.10)
GFR calc Af Amer: 48 mL/min — ABNORMAL LOW (ref >90)
GFR calc non Af Amer: 42 mL/min — ABNORMAL LOW (ref >90)
Glucose, Bld: 93 mg/dL (ref 70–99)

## 2012-05-24 LAB — URINALYSIS, ROUTINE W REFLEX MICROSCOPIC
Bilirubin Urine: NEGATIVE
Hgb urine dipstick: NEGATIVE
Specific Gravity, Urine: 1.01 (ref 1.005–1.030)
pH: 5.5 (ref 5.0–8.0)

## 2012-05-24 LAB — RETICULOCYTES
RBC.: 3.58 MIL/uL — ABNORMAL LOW (ref 3.87–5.11)
Retic Count, Absolute: 153.9 10*3/uL (ref 19.0–186.0)

## 2012-05-24 LAB — POCT I-STAT TROPONIN I

## 2012-05-24 MED ORDER — TRAMADOL HCL 50 MG PO TABS
50.0000 mg | ORAL_TABLET | Freq: Four times a day (QID) | ORAL | Status: DC | PRN
  Administered 2012-05-26: 50 mg via ORAL
  Filled 2012-05-24: qty 1

## 2012-05-24 MED ORDER — ONDANSETRON HCL 4 MG PO TABS: 4.0000 mg | ORAL_TABLET | Freq: Four times a day (QID) | ORAL | Status: DC | PRN

## 2012-05-24 MED ORDER — PANTOPRAZOLE SODIUM 40 MG PO TBEC
40.0000 mg | DELAYED_RELEASE_TABLET | Freq: Every day | ORAL | Status: DC
  Administered 2012-05-25 – 2012-05-26 (×2): 40 mg via ORAL
  Filled 2012-05-24 (×2): qty 1

## 2012-05-24 MED ORDER — CITALOPRAM HYDROBROMIDE 20 MG PO TABS
20.0000 mg | ORAL_TABLET | Freq: Every morning | ORAL | Status: DC
  Administered 2012-05-25 – 2012-05-26 (×2): 20 mg via ORAL
  Filled 2012-05-24 (×2): qty 1

## 2012-05-24 MED ORDER — POTASSIUM CHLORIDE CRYS ER 20 MEQ PO TBCR
20.0000 meq | EXTENDED_RELEASE_TABLET | Freq: Every day | ORAL | Status: DC
  Administered 2012-05-25 – 2012-05-26 (×2): 20 meq via ORAL
  Filled 2012-05-24 (×2): qty 1

## 2012-05-24 MED ORDER — SODIUM CHLORIDE 0.9 % IJ SOLN
3.0000 mL | Freq: Two times a day (BID) | INTRAMUSCULAR | Status: DC
  Administered 2012-05-25 – 2012-05-26 (×3): 3 mL via INTRAVENOUS

## 2012-05-24 MED ORDER — NITROGLYCERIN 2 % TD OINT
1.0000 [in_us] | TOPICAL_OINTMENT | Freq: Four times a day (QID) | TRANSDERMAL | Status: DC
  Administered 2012-05-24: 1 [in_us] via TOPICAL
  Filled 2012-05-24: qty 30

## 2012-05-24 MED ORDER — METOPROLOL TARTRATE 12.5 MG HALF TABLET
12.5000 mg | ORAL_TABLET | Freq: Two times a day (BID) | ORAL | Status: DC
  Administered 2012-05-25 (×2): 12.5 mg via ORAL
  Filled 2012-05-24 (×5): qty 1

## 2012-05-24 MED ORDER — FERROUS SULFATE 325 (65 FE) MG PO TABS
325.0000 mg | ORAL_TABLET | Freq: Two times a day (BID) | ORAL | Status: DC
  Administered 2012-05-25 – 2012-05-26 (×3): 325 mg via ORAL
  Filled 2012-05-24 (×5): qty 1

## 2012-05-24 MED ORDER — AMIODARONE HCL 200 MG PO TABS
200.0000 mg | ORAL_TABLET | Freq: Every day | ORAL | Status: DC
  Administered 2012-05-25: 200 mg via ORAL
  Filled 2012-05-24 (×2): qty 1

## 2012-05-24 MED ORDER — FUROSEMIDE 10 MG/ML IJ SOLN
60.0000 mg | Freq: Two times a day (BID) | INTRAMUSCULAR | Status: DC
  Administered 2012-05-25 – 2012-05-26 (×3): 60 mg via INTRAVENOUS
  Filled 2012-05-24 (×5): qty 6

## 2012-05-24 MED ORDER — ONDANSETRON HCL 4 MG/2ML IJ SOLN: 4.0000 mg | Freq: Four times a day (QID) | INTRAMUSCULAR | Status: DC | PRN

## 2012-05-24 MED ORDER — LORATADINE 10 MG PO TABS
10.0000 mg | ORAL_TABLET | Freq: Every day | ORAL | Status: DC
  Administered 2012-05-25 – 2012-05-26 (×2): 10 mg via ORAL
  Filled 2012-05-24 (×2): qty 1

## 2012-05-24 MED ORDER — SODIUM CHLORIDE 0.9 % IJ SOLN
3.0000 mL | Freq: Two times a day (BID) | INTRAMUSCULAR | Status: DC
  Administered 2012-05-25: 3 mL via INTRAVENOUS

## 2012-05-24 MED ORDER — DOCUSATE SODIUM 100 MG PO CAPS
100.0000 mg | ORAL_CAPSULE | Freq: Two times a day (BID) | ORAL | Status: DC | PRN
  Filled 2012-05-24: qty 1

## 2012-05-24 MED ORDER — ACETAMINOPHEN 650 MG RE SUPP: 650.0000 mg | Freq: Four times a day (QID) | RECTAL | Status: DC | PRN

## 2012-05-24 MED ORDER — HYDRALAZINE HCL 25 MG PO TABS
25.0000 mg | ORAL_TABLET | Freq: Three times a day (TID) | ORAL | Status: DC
  Administered 2012-05-25 (×4): 25 mg via ORAL
  Filled 2012-05-24 (×7): qty 1

## 2012-05-24 MED ORDER — MELOXICAM 7.5 MG PO TABS
7.5000 mg | ORAL_TABLET | Freq: Every day | ORAL | Status: DC
  Administered 2012-05-25 – 2012-05-26 (×2): 7.5 mg via ORAL
  Filled 2012-05-24 (×2): qty 1

## 2012-05-24 MED ORDER — SODIUM CHLORIDE 0.9 % IV SOLN: 250.0000 mL | INTRAVENOUS | Status: DC | PRN

## 2012-05-24 MED ORDER — TEMAZEPAM 15 MG PO CAPS
15.0000 mg | ORAL_CAPSULE | Freq: Every evening | ORAL | Status: DC | PRN
  Administered 2012-05-25 (×2): 15 mg via ORAL
  Filled 2012-05-24 (×2): qty 1

## 2012-05-24 MED ORDER — METOCLOPRAMIDE HCL 5 MG PO TABS
5.0000 mg | ORAL_TABLET | Freq: Three times a day (TID) | ORAL | Status: DC
  Administered 2012-05-25 – 2012-05-26 (×5): 5 mg via ORAL
  Filled 2012-05-24 (×9): qty 1

## 2012-05-24 MED ORDER — FUROSEMIDE 10 MG/ML IJ SOLN
40.0000 mg | Freq: Once | INTRAMUSCULAR | Status: AC
  Administered 2012-05-24: 40 mg via INTRAVENOUS
  Filled 2012-05-24: qty 4

## 2012-05-24 MED ORDER — SODIUM CHLORIDE 0.9 % IJ SOLN
3.0000 mL | INTRAMUSCULAR | Status: DC | PRN
  Administered 2012-05-26: 3 mL via INTRAVENOUS

## 2012-05-24 MED ORDER — ACETAMINOPHEN 325 MG PO TABS: 650.0000 mg | ORAL_TABLET | Freq: Four times a day (QID) | ORAL | Status: DC | PRN

## 2012-05-24 NOTE — ED Notes (Signed)
Pt states "I am a very hard stick" and prefers for labs not to be drawn in triage

## 2012-05-24 NOTE — ED Provider Notes (Addendum)
History     CSN: [id number]  Arrival date & time 05/24/12  1514   First MD Initiated Contact with Patient 05/24/12 1858      Chief Complaint  Patient presents with  . Leg Swelling  .     HPI Pt has been having leg swelling for the last week.  She has also noticed that she gets short of breath when she exerts herself at times and when she lies flat she will be more short of breath as well.  She denies any fever or chest pain.  An occasional cough with her cigarette smoking.  History of blood of blood clots in the past but this is different.  No history of CHF.  No blood in her stool.    Past Medical History  Diagnosis Date  . Hypertension   . Borderline diabetes   . Irregular heart beat   . Hyperlipidemia     Past Surgical History  Procedure Date  . Hemmoroid     No family history on file.  History  Substance Use Topics  . Smoking status: Current Everyday Smoker    Types: Cigarettes  . Smokeless tobacco: Current User  . Alcohol Use: No    OB History    Grav Para Term Preterm Abortions TAB SAB Ect Mult Living                  Review of Systems  All other systems reviewed and are negative.    Allergies  Penicillins  Home Medications   Current Outpatient Rx  Name Route Sig Dispense Refill  . AMIODARONE HCL 200 MG PO TABS Oral Take 200 mg by mouth daily.    . ASPIRIN 325 MG PO TBEC Oral Take 325 mg by mouth daily.    Marland Kitchen CALCIUM + D3 PO Oral Take 1 tablet by mouth daily.    Marland Kitchen CITALOPRAM HYDROBROMIDE 20 MG PO TABS Oral Take 20 mg by mouth every morning.     Marland Kitchen DOCUSATE SODIUM 100 MG PO CAPS Oral Take 100 mg by mouth 2 (two) times daily as needed. For constipation    . FUROSEMIDE 40 MG PO TABS Oral Take 1 tablet (40 mg total) by mouth every other day as needed (for leg swelling, Sob with weight gain). 30 tablet 0  . HYDRALAZINE HCL 25 MG PO TABS Oral Take 25 mg by mouth 3 (three) times daily.    . IRON COMPLEX PO Oral Take 50 mg by mouth daily.    Marland Kitchen LORATADINE  10 MG PO TABS Oral Take 10 mg by mouth daily.    . MELOXICAM 7.5 MG PO TABS Oral Take 7.5 mg by mouth daily.    Marland Kitchen METOCLOPRAMIDE HCL 5 MG PO TBDP Oral Take 1 tablet by mouth 4 (four) times daily -  before meals and at bedtime.     Marland Kitchen METOPROLOL TARTRATE 25 MG PO TABS Oral Take 0.5 tablets (12.5 mg total) by mouth 2 (two) times daily. 60 tablet 0  . PANTOPRAZOLE SODIUM 40 MG PO TBEC Oral Take 40 mg by mouth daily.    Marland Kitchen POTASSIUM CHLORIDE CRYS ER 20 MEQ PO TBCR Oral Take 20 mEq by mouth daily.     Marland Kitchen TEMAZEPAM 15 MG PO CAPS Oral Take 15 mg by mouth at bedtime as needed.    Marland Kitchen TRAMADOL HCL 50 MG PO TABS Oral Take 50 mg by mouth every 6 (six) hours as needed. For pain relief      BP 128/90  Pulse 56  Temp 98.3 F (36.8 C) (Oral)  Resp 20  SpO2 100%  Physical Exam  Nursing note and vitals reviewed. Constitutional: She appears well-developed and well-nourished. No distress.  HENT:  Head: Normocephalic and atraumatic.  Right Ear: External ear normal.  Left Ear: External ear normal.  Eyes: Conjunctivae are normal. Right eye exhibits no discharge. Left eye exhibits no discharge. No scleral icterus.  Neck: Neck supple. No tracheal deviation present.  Cardiovascular: Normal rate, regular rhythm and intact distal pulses.   Pulmonary/Chest: Effort normal. No stridor. No respiratory distress. She has no wheezes. She has rales (bases bilaterally).  Abdominal: Soft. Bowel sounds are normal. She exhibits no distension. There is no tenderness. There is no rebound and no guarding.  Genitourinary:       Brown stool, no evidence of blood  Musculoskeletal: She exhibits edema (bilateral, pitting edema to calf and feet). She exhibits no tenderness.  Neurological: She is alert. She has normal strength. No sensory deficit. Cranial nerve deficit:  no gross defecits noted. She exhibits normal muscle tone. She displays no seizure activity. Coordination normal.  Skin: Skin is warm and dry. No rash noted.    Psychiatric: She has a normal mood and affect.    ED Course  Procedures (including critical care time) EKG sinus bradycardia, rate 59, nl axis, nl intervals LEFT ATRIAL ABNORMALITY ~ P,P'>67mS, <-0.78mV V1 Abnormal ECG Labs Reviewed  CBC WITH DIFFERENTIAL - Abnormal; Notable for the following:    WBC 15.9 (*)     RBC 3.56 (*)     Hemoglobin 6.8 (*)     HCT 23.8 (*)     MCV 66.9 (*)     MCH 18.8 (*)     MCHC 28.2 (*)     RDW 18.2 (*)     Platelets 651 (*)     Neutrophils Relative 78 (*)     Neutro Abs 12.3 (*)     Basophils Absolute 0.2 (*)     All other components within normal limits  BASIC METABOLIC PANEL - Abnormal; Notable for the following:    CO2 17 (*)     Creatinine, Ser 1.34 (*)     GFR calc non Af Amer 42 (*)     GFR calc Af Amer 48 (*)     All other components within normal limits  PRO B NATRIURETIC PEPTIDE - Abnormal; Notable for the following:    Pro B Natriuretic peptide (BNP) 2614.0 (*)     All other components within normal limits  POCT I-STAT TROPONIN I   Dg Chest 2 View  05/24/2012  *RADIOLOGY REPORT*  Clinical Data: Bilateral leg swelling.  CHEST - 2 VIEW  Comparison: 03/04/2012.  Findings: The heart is mildly enlarged.  The mediastinal and hilar contours are stable.  There is interstitial pulmonary edema.  No pleural effusion.  The bony thorax is intact.  IMPRESSION: Cardiac enlargement and interstitial pulmonary edema.  Original Report Authenticated By: P. Loralie Champagne, M.D.     MDM  The patient appears to worsening anemia however I believe the primary issue is that she has recurrent congestive heart failure. The patient will be started on IV Lasix and nitroglycerin paste will consult the hospitalist service for admission and further treatment. Stool guaiac  performed to  Assess for GI bleeding.        Celene Kras, MD 05/24/12 2114  Celene Kras, MD 06/10/12 [phone]

## 2012-05-24 NOTE — ED Notes (Addendum)
Pt reports edema on lower extremities x1 week. 2+ pitting edema on feet/ankles/legs, bilat. Skin slightly clammy. Denies pain. Reports hx of blood clot, was put on coumadin few years ago. Pt unsure where clot was

## 2012-05-25 LAB — BASIC METABOLIC PANEL
BUN: 21 mg/dL (ref 6–23)
Calcium: 9.2 mg/dL (ref 8.4–10.5)
Creatinine, Ser: 1.15 mg/dL — ABNORMAL HIGH (ref 0.50–1.10)
GFR calc non Af Amer: 50 mL/min — ABNORMAL LOW (ref >90)
Glucose, Bld: 135 mg/dL — ABNORMAL HIGH (ref 70–99)
Sodium: 136 mEq/L (ref 135–145)

## 2012-05-25 LAB — IRON AND TIBC: Iron: <10 ug/dL — ABNORMAL LOW (ref 42–135)

## 2012-05-25 LAB — FOLATE: Folate: 15.1 ng/mL

## 2012-05-25 LAB — MRSA PCR SCREENING: MRSA by PCR: NEGATIVE

## 2012-05-25 LAB — VITAMIN B12: Vitamin B-12: 1215 pg/mL — ABNORMAL HIGH (ref 211–911)

## 2012-05-25 MED ORDER — POTASSIUM CHLORIDE CRYS ER 20 MEQ PO TBCR
40.0000 meq | EXTENDED_RELEASE_TABLET | Freq: Once | ORAL | Status: AC
  Administered 2012-05-25: 40 meq via ORAL
  Filled 2012-05-25: qty 2

## 2012-05-25 MED ORDER — VITAMINS A & D EX OINT
TOPICAL_OINTMENT | CUTANEOUS | Status: AC
  Filled 2012-05-25: qty 5

## 2012-05-25 MED ORDER — FUROSEMIDE 10 MG/ML IJ SOLN
20.0000 mg | Freq: Once | INTRAMUSCULAR | Status: AC
  Administered 2012-05-25: 20 mg via INTRAVENOUS
  Filled 2012-05-25: qty 2

## 2012-05-25 NOTE — Progress Notes (Signed)
   CARE MANAGEMENT NOTE 05/25/2012  Patient:  [name], [name]   Account Number:  [id number]  Date Initiated:  05/25/2012  Documentation initiated by:  Jiles Crocker  Subjective/Objective Assessment:   ADMITTED WITH ANEMIA, CHF     Action/Plan:   PCP: Georganna Skeans, MD; LIVES AT HOME ALONE; POSSIBLY NEEDS HHC AT DISCHARGE   Anticipated DC Date:  06/01/2012   Anticipated DC Plan:  HOME W HOME HEALTH SERVICES      DC Planning Services  CM consult               Status of service:  In process, will continue to follow Medicare Important Message given?  NA - LOS <3 / Initial given by admissions (If response is "NO", the following Medicare IM given date fields will be blank)  Per UR Regulation:  Reviewed for med. necessity/level of care/duration of stay  Comments:  05/25/2012- B Nashira Mcglynn RN, BSN, MHA

## 2012-05-25 NOTE — Progress Notes (Signed)
[facility] PROGRESS NOTE  [name]:[id number] DOB: 11-05-1951 DOA: 05/24/2012 PCP: Georganna Skeans, MD  Brief narrative: This is a  61 yo patient with chronic diastolic heart failure, atrial fibrillation who presented to the emergency room with complaints of swelling in bilateral lower extremities for several days. The patient denied any shortness of breath, chest pain, PND, orthopnea. Chest x-ray showed evidence of pulmonary edema which correlates with an elevated pro BNP of 2,614. Review of her record shows that her pro BNP on 03/04/2012 with only minimally decreased at 2374. Also noted that HG 6.8. Pt admitted to hospitalist.    Consultants:    Procedures:    Antibiotics:    HPI/Subjective: Pt alert oriented x3. Reports feeling better. Denies pain/discomfort/sob. NAD  Objective: Filed Vitals:   05/25/12 0528 05/25/12 0552 05/25/12 0652 05/25/12 0750  BP: 126/72 122/72 109/66 119/63  Pulse: 78 74 93 110  Temp: 98.1 F (36.7 C) 98.1 F (36.7 C) 98.4 F (36.9 C) 98.3 F (36.8 C)  TempSrc: Oral Oral Oral Oral  Resp: 18 18 20 18   Height:      Weight:      SpO2:        Intake/Output Summary (Last 24 hours) at 05/25/12 1056 Last data filed at 05/25/12 0900  Gross per 24 hour  Intake 784.67 ml  Output   3650 ml  Net -2865.33 ml    Exam:   General:  Alert oriented x3, speech clear, well nourished NSD  Cardiovascular: RRR, No MGR, trace LEE PPP  Respiratory: Normal effort. BSCTAB. No rhonchi, wheeze  Abdomen: obese, soft +BS  Data Reviewed: Basic Metabolic Panel:  Lab 05/24/12 1478  NA 137  K 4.2  CL 105  CO2 17*  GLUCOSE 93  BUN 21  CREATININE 1.34*  CALCIUM 9.0  MG --  PHOS --   Liver Function Tests: No results found for this basename: AST:5,ALT:5,ALKPHOS:5,BILITOT:5,PROT:5,ALBUMIN:5 in the last 168 hours No results found for this basename: LIPASE:5,AMYLASE:5 in the last 168 hours No results found for this basename: AMMONIA:5  in the last 168 hours CBC:  Lab 05/24/12 1634  WBC 15.9*  NEUTROABS 12.3*  HGB 6.8*  HCT 23.8*  MCV 66.9*  PLT 651*   Cardiac Enzymes: No results found for this basename: CKTOTAL:5,CKMB:5,CKMBINDEX:5,TROPONINI:5 in the last 168 hours BNP: No components found with this basename: POCBNP:5 CBG: No results found for this basename: GLUCAP:5 in the last 168 hours  Recent Results (from the past 240 hour(s))  MRSA PCR SCREENING     Status: Normal   Collection Time   05/25/12  5:49 AM      Component Value Range Status Comment   MRSA by PCR NEGATIVE  NEGATIVE Final      Studies: Dg Chest 2 View  05/24/2012  *RADIOLOGY REPORT*  Clinical Data: Bilateral leg swelling.  CHEST - 2 VIEW  Comparison: 03/04/2012.  Findings: The heart is mildly enlarged.  The mediastinal and hilar contours are stable.  There is interstitial pulmonary edema.  No pleural effusion.  The bony thorax is intact.  IMPRESSION: Cardiac enlargement and interstitial pulmonary edema.  Original Report Authenticated By: P. Loralie Champagne, M.D.    Scheduled Meds:   . amiodarone  200 mg Oral Daily  . citalopram  20 mg Oral q morning - 10a  . ferrous sulfate  325 mg Oral BID WC  . furosemide  20 mg Intravenous Once  . furosemide  40 mg Intravenous Once  . furosemide  60 mg Intravenous  Q12H  . hydrALAZINE  25 mg Oral TID  . loratadine  10 mg Oral Daily  . meloxicam  7.5 mg Oral Daily  . metoCLOPramide  5 mg Oral TID AC & HS  . metoprolol tartrate  12.5 mg Oral BID  . pantoprazole  40 mg Oral Daily  . potassium chloride SA  20 mEq Oral Daily  . sodium chloride  3 mL Intravenous Q12H  . sodium chloride  3 mL Intravenous Q12H  . DISCONTD: nitroGLYCERIN  1 inch Topical Q6H   Continuous Infusions:    Assessment/Plan: Anemia (05/24/2012)  Patient has a significant anemia without any evidence of bleeding. S/P 2 units of packed red blood cells with labs pending. Anemia panel with iron <10 and B12 1215. FOBT pending.    Acute  on chronic diastolic CHF (congestive heart failure) (05/24/2012)  Likely related to #1 in setting of chronic HF. Volume status -2.8. Wt 87.9. Will continue IV lasix, strict I & O's and daily wt. 2decho in April 13 yields EF 55-60% with mild LVH.    Leukocytosis (05/24/2012) As noted in previous records the patient has a history of chronic leukocytosis. Her white blood cell count is 15,000 on admission. Urinalysis with few bacteria and 7-10 WBC and many epithelial. Afebrile, non-toxic. Will monitor  Atrial fibrillation (03/02/2012)  Patient has a history of atrial fibrillation who is not on any anticoagulation. Her heart rate is controlled at present .  HYPERLIPIDEMIA (05/13/2007)  Continue current medication   HYPERTENSION (05/13/2007)  Blood pressure well controlled will continue home meds   DVT prophylaxis with SCDs in light of her low hemoglobin.   Code Status:  Family Communication:  Disposition Plan:    Gwenyth Bender, MD  [facility] Pager [phone]  If 7PM-7AM, please contact night-coverage [email or web address] Password [email or web address] 05/25/2012, 10:56 AM   LOS: 1 day

## 2012-05-25 NOTE — Progress Notes (Signed)
Patient seen and examined. Please see not by [name]. Continue diureisis.  [name]

## 2012-05-25 NOTE — Progress Notes (Signed)
Pt HR deceased from the 80's-100's to 51. BP 109/68, HR 51, O2sat 96% RA. MD notified. Will continue to monitor.

## 2012-05-25 NOTE — H&P (Signed)
Hospital Admission Note Date: 05/25/2012  Patient name: [name] Medical record number: [id number] Date of birth: [birth year] Age: 61 y.o. Gender: female PCP: Georganna Skeans, MD  Attending physician: Joseph Art, DO  Chief Complaint: Swelling in bilateral lower extremities.  History of Present Illness: This is a patient with chronic diastolic heart failure atrial fibrillation who presents to the emergency room with complaints of swelling in bilateral lower extremities for several days. The patient denies any shortness of breath, chest pain, PND, orthopnea. Chest x-ray shows evidence of pulmonary edema which correlates with an elevated pro BNP of 2,614. I reviewed her record shows that her pro BNP on 03/04/2012 with only minimally decreased at 2374.   Patient also admits  to dizziness upon standing which she noted it began to occur today appear. During her evaluation in the emergency room the patient was found to have a hemoglobin of 6.8 which is significantly diminished from her hemoglobin of 8.7 approximately 1-1/2 months ago. The patient does have a history of hemorrhoids which requires surgical intervention the past. Over the patient denies seeing any bright red blood per rectum or having any melanotic stools.  We are asked to see the patient for further evaluation and management. I spoke with patient's daughter and updated her and the patient's current clinical condition. The patient's daughter states the patient has difficulty with memory and asked that one of her children be contacted for medical information to be indicated. The patient concurs with this. It is also important of the patient's discharge instructions be communicated to one of her daughters as patient admits that she does not remember the instructions at the time of discharge.  Scheduled Meds:   . amiodarone  200 mg Oral Daily  . citalopram  20 mg Oral q morning - 10a  . ferrous sulfate  325 mg Oral BID WC  .  furosemide  20 mg Intravenous Once  . furosemide  40 mg Intravenous Once  . furosemide  60 mg Intravenous Q12H  . hydrALAZINE  25 mg Oral TID  . loratadine  10 mg Oral Daily  . meloxicam  7.5 mg Oral Daily  . metoCLOPramide  5 mg Oral TID AC & HS  . metoprolol tartrate  12.5 mg Oral BID  . pantoprazole  40 mg Oral Daily  . potassium chloride SA  20 mEq Oral Daily  . sodium chloride  3 mL Intravenous Q12H  . sodium chloride  3 mL Intravenous Q12H  . DISCONTD: nitroGLYCERIN  1 inch Topical Q6H   Continuous Infusions:  PRN Meds:.sodium chloride, acetaminophen, acetaminophen, docusate sodium, ondansetron (ZOFRAN) IV, ondansetron, sodium chloride, temazepam, traMADol Allergies: Penicillins Past Medical History  Diagnosis Date  . Hypertension   . Borderline diabetes   . Irregular heart beat   . Hyperlipidemia    Past Surgical History  Procedure Date  . Hemmoroid    History reviewed. No pertinent family history. History   Social History  . Marital Status: Single    Spouse Name: N/A    Number of Children: N/A  . Years of Education: N/A   Occupational History  . Not on file.   Social History Main Topics  . Smoking status: Current Everyday Smoker    Types: Cigarettes  . Smokeless tobacco: Current User  . Alcohol Use: No  . Drug Use: No  . Sexually Active:    Other Topics Concern  . Not on file   Social History Narrative  . No narrative on file  Review of Systems: A comprehensive review of systems was negative except for elements noted in the history of present illness. Physical Exam:  Intake/Output Summary (Last 24 hours) at 05/25/12 0104 Last data filed at 05/25/12 0014  Gross per 24 hour  Intake      0 ml  Output    300 ml  Net   -300 ml   General: Alert, awake, oriented x3, in no acute distress.  HEENT: [place]/AT PEERL, EOMI Neck: Trachea midline,  no masses, no thyromegal,y no JVD, no carotid bruit OROPHARYNX:  Moist, No exudate/ erythema/lesions.  Heart:  Regular rate and rhythm, without murmurs, rubs, gallops.  Lungs: Patient has mild bibasilar rales however no wheezing or rhonchi noted. No increased vocal fremitus resonant to percussion  Abdomen: Soft, nontender, nondistended, positive bowel sounds, no masses no hepatosplenomegaly noted..  Neuro: No focal neurological deficits noted cranial nerves II through XII grossly intact. DTRs 2+ bilaterally upper and lower extremities.  Musculoskeletal: No warm swelling or erythema around joints, no spinal tenderness noted. Patient has 2+ edema in bilateral lower extremities Psychiatric: Patient alert and oriented x3, good insight and cognition, poor recent  recall.   Lab results:  [facility] 05/24/12 1634  NA 137  K 4.2  CL 105  CO2 17*  GLUCOSE 93  BUN 21  CREATININE 1.34*  CALCIUM 9.0  MG --  PHOS --   No results found for this basename: AST:2,ALT:2,ALKPHOS:2,BILITOT:2,PROT:2,ALBUMIN:2 in the last 72 hours No results found for this basename: LIPASE:2,AMYLASE:2 in the last 72 hours  Basename 05/24/12 1634  WBC 15.9*  NEUTROABS 12.3*  HGB 6.8*  HCT 23.8*  MCV 66.9*  PLT 651*   No results found for this basename: CKTOTAL:3,CKMB:3,CKMBINDEX:3,TROPONINI:3 in the last 72 hours No components found with this basename: POCBNP:3 No results found for this basename: DDIMER:2 in the last 72 hours No results found for this basename: HGBA1C:2 in the last 72 hours No results found for this basename: CHOL:2,HDL:2,LDLCALC:2,TRIG:2,CHOLHDL:2,LDLDIRECT:2 in the last 72 hours No results found for this basename: TSH,T4TOTAL,FREET3,T3FREE,THYROIDAB in the last 72 hours  Basename 05/24/12 1634  VITAMINB12 --  FOLATE --  FERRITIN --  TIBC --  IRON --  RETICCTPCT 4.3*   Imaging results:  Dg Chest 2 View  05/24/2012  *RADIOLOGY REPORT*  Clinical Data: Bilateral leg swelling.  CHEST - 2 VIEW  Comparison: 03/04/2012.  Findings: The heart is mildly enlarged.  The mediastinal and hilar contours are stable.   There is interstitial pulmonary edema.  No pleural effusion.  The bony thorax is intact.  IMPRESSION: Cardiac enlargement and interstitial pulmonary edema.  Original Report Authenticated By: P. Loralie Champagne, M.D.   Other results: WUJ:WJXBJY sinus rhythm.   Patient Active Hospital Problem List: Anemia (05/24/2012)   Assessment: Patient has a significant anemia without any evidence of bleeding. I will obtain anemia panel including reticulocytes. And the patient will be transfused 2 units of packed red blood cells in light of her cardiac conditions. She will receive Lasix between each unit and consideration of her decompensated diastolic heart failure.   Acute on chronic diastolic CHF (congestive heart failure) (05/24/2012)   Assessment: Patient presents with decompensated diastolic heart failure with findings of pulmonary edema the x-ray and an elevated pro BNP. The patient is to start IV Lasix and will continue. I will defer to the rounding physician to determine if she can be transitioned over to oral Lasix. The patient lacks education on how she should monitor her congestive heart failure by daily weights.  She will need CHF education prior to being discharged from the hospital.    Leukocytosis (05/24/2012)   Assessment: As noted in previous records the patient has a history of chronic leukocytosis. Her white blood cell count is 15,000 today and I will obtain a urinalysis to ensure the patient does not have an occult urinary tract infection at may be contributed to leukocytosis. Otherwise the patient has no signs of infection.   Atrial fibrillation (03/02/2012)   Assessment: Patient has a history of atrial fibrillation who is not on any anticoagulation. Her heart rate is controlled at present with a heart rate of 52. The patient is being placed on a telemetry bed and we will monitor her rhythm and rate.    HYPERLIPIDEMIA (05/13/2007)   Assessment: Continue current medication    HYPERTENSION  (05/13/2007)   Assessment: Blood pressure well controlled will continue.      DVT prophylaxis with SCDs in light of her  low hemoglobin.   Total time for this initial evaluation approximately 57 minutes.    Harveen Flesch A. 05/25/2012, 1:04 AM

## 2012-05-26 DIAGNOSIS — I509 Heart failure, unspecified: Secondary | ICD-10-CM | POA: Diagnosis not present

## 2012-05-26 LAB — TYPE AND SCREEN
ABO/RH(D): O POS
Unit division: 0

## 2012-05-26 LAB — CBC
MCH: 20.6 pg — ABNORMAL LOW (ref 26.0–34.0)
MCHC: 29.7 g/dL — ABNORMAL LOW (ref 30.0–36.0)
MCV: 69.4 fL — ABNORMAL LOW (ref 78.0–100.0)
Platelets: 586 10*3/uL — ABNORMAL HIGH (ref 150–400)
RDW: 20.1 % — ABNORMAL HIGH (ref 11.5–15.5)

## 2012-05-26 LAB — BASIC METABOLIC PANEL
Calcium: 9.6 mg/dL (ref 8.4–10.5)
Creatinine, Ser: 1.18 mg/dL — ABNORMAL HIGH (ref 0.50–1.10)
GFR calc non Af Amer: 49 mL/min — ABNORMAL LOW (ref >90)
Sodium: 140 mEq/L (ref 135–145)

## 2012-05-26 MED ORDER — FUROSEMIDE 40 MG PO TABS: 40.0000 mg | ORAL_TABLET | Freq: Two times a day (BID) | ORAL | Status: DC

## 2012-05-26 MED ORDER — POTASSIUM CHLORIDE CRYS ER 20 MEQ PO TBCR: 20.0000 meq | EXTENDED_RELEASE_TABLET | Freq: Two times a day (BID) | ORAL | Status: AC

## 2012-05-26 NOTE — Discharge Summary (Addendum)
Physician Discharge Summary  GLESSIE EUSTICE MRN: [id number] DOB/AGE: 11-29-1950 61 y.o.  PCP: Georganna Skeans, MD   Admit date: 05/24/2012 Discharge date: 05/26/2012  Discharge Diagnoses:     HYPERLIPIDEMIA  HYPERTENSION  Atrial fibrillation  Anemia  Acute on chronic diastolic CHF (congestive heart failure)  Leukocytosis   Medication List  As of 05/26/2012 11:41 AM   STOP taking these medications         hydrALAZINE 25 MG tablet      meloxicam 7.5 MG tablet         TAKE these medications         amiodarone 200 MG tablet   Commonly known as: PACERONE   Take 200 mg by mouth daily.      aspirin 325 MG EC tablet   Take 325 mg by mouth daily.      CALCIUM + D3 PO   Take 1 tablet by mouth daily.      citalopram 20 MG tablet   Commonly known as: CELEXA   Take 20 mg by mouth every morning.      docusate sodium 100 MG capsule   Commonly known as: COLACE   Take 100 mg by mouth 2 (two) times daily as needed. For constipation      furosemide 40 MG tablet   Commonly known as: LASIX   Take 1 tablet (40 mg total) by mouth 2 (two) times daily.      IRON COMPLEX PO   Take 50 mg by mouth daily.      loratadine 10 MG tablet   Commonly known as: CLARITIN   Take 10 mg by mouth daily.      Metoclopramide HCl 5 MG Tbdp   Take 1 tablet by mouth 4 (four) times daily -  before meals and at bedtime.      metoprolol tartrate 25 MG tablet   Commonly known as: LOPRESSOR   Take 0.5 tablets (12.5 mg total) by mouth 2 (two) times daily.      pantoprazole 40 MG tablet   Commonly known as: PROTONIX   Take 40 mg by mouth daily.      potassium chloride SA 20 MEQ tablet   Commonly known as: K-DUR,KLOR-CON   Take 1 tablet (20 mEq total) by mouth 2 (two) times daily.      temazepam 15 MG capsule   Commonly known as: RESTORIL   Take 15 mg by mouth at bedtime as needed.      traMADol 50 MG tablet   Commonly known as: ULTRAM   Take 50 mg by mouth every 6 (six) hours as  needed. For pain relief            Discharge Condition: Stable Disposition: 01-Home or Self Care   Consults: None  Significant Diagnostic Studies: Dg Chest 2 View  05/24/2012  *RADIOLOGY REPORT*  Clinical Data: Bilateral leg swelling.  CHEST - 2 VIEW  Comparison: 03/04/2012.  Findings: The heart is mildly enlarged.  The mediastinal and hilar contours are stable.  There is interstitial pulmonary edema.  No pleural effusion.  The bony thorax is intact.  IMPRESSION: Cardiac enlargement and interstitial pulmonary edema.  Original Report Authenticated By: P. Loralie Champagne, M.D.    Microbiology: Recent Results (from the past 240 hour(s))  MRSA PCR SCREENING     Status: Normal   Collection Time   05/25/12  5:49 AM      Component Value Range Status Comment   MRSA by PCR NEGATIVE  NEGATIVE Final      Labs: Results for orders placed during the hospital encounter of 05/24/12 (from the past 48 hour(s))  CBC WITH DIFFERENTIAL     Status: Abnormal   Collection Time   05/24/12  4:34 PM      Component Value Range Comment   WBC 15.9 (*) 4.0 - 10.5 K/uL    RBC 3.56 (*) 3.87 - 5.11 MIL/uL    Hemoglobin 6.8 (*) 12.0 - 15.0 g/dL    HCT 40.9 (*) 81.1 - 46.0 %    MCV 66.9 (*) 78.0 - 100.0 fL    MCH 18.8 (*) 26.0 - 34.0 pg    MCHC 28.2 (*) 30.0 - 36.0 g/dL    RDW 91.4 (*) 78.2 - 15.5 %    Platelets 651 (*) 150 - 400 K/uL    Neutrophils Relative 78 (*) 43 - 77 %    Lymphocytes Relative 13  12 - 46 %    Monocytes Relative 5  3 - 12 %    Eosinophils Relative 3  0 - 5 %    Basophils Relative 1  0 - 1 %    Neutro Abs 12.3 (*) 1.7 - 7.7 K/uL    Lymphs Abs 2.1  0.7 - 4.0 K/uL    Monocytes Absolute 0.8  0.1 - 1.0 K/uL    Eosinophils Absolute 0.5  0.0 - 0.7 K/uL    Basophils Absolute 0.2 (*) 0.0 - 0.1 K/uL    RBC Morphology POLYCHROMASIA PRESENT   RARE NRBCs   Smear Review LARGE PLATELETS PRESENT     BASIC METABOLIC PANEL     Status: Abnormal   Collection Time   05/24/12  4:34 PM      Component  Value Range Comment   Sodium 137  135 - 145 mEq/L    Potassium 4.2  3.5 - 5.1 mEq/L    Chloride 105  96 - 112 mEq/L    CO2 17 (*) 19 - 32 mEq/L    Glucose, Bld 93  70 - 99 mg/dL    BUN 21  6 - 23 mg/dL    Creatinine, Ser 9.56 (*) 0.50 - 1.10 mg/dL    Calcium 9.0  8.4 - 21.3 mg/dL    GFR calc non Af Amer 42 (*) >90 mL/min    GFR calc Af Amer 48 (*) >90 mL/min   PRO B NATRIURETIC PEPTIDE     Status: Abnormal   Collection Time   05/24/12  4:34 PM      Component Value Range Comment   Pro B Natriuretic peptide (BNP) 2614.0 (*) 0 - 125 pg/mL   RETICULOCYTES     Status: Abnormal   Collection Time   05/24/12  4:34 PM      Component Value Range Comment   Retic Ct Pct 4.3 (*) 0.4 - 3.1 %    RBC. 3.58 (*) 3.87 - 5.11 MIL/uL    Retic Count, Manual 153.9  19.0 - 186.0 K/uL   POCT I-STAT TROPONIN I     Status: Normal   Collection Time   05/24/12  7:51 PM      Component Value Range Comment   Troponin i, poc 0.00  0.00 - 0.08 ng/mL    Comment 3            OCCULT BLOOD, POC DEVICE     Status: Normal   Collection Time   05/24/12  9:05 PM      Component Value Range Comment  Fecal Occult Bld NEGATIVE     URINALYSIS, ROUTINE W REFLEX MICROSCOPIC     Status: Normal   Collection Time   05/24/12  9:28 PM      Component Value Range Comment   Color, Urine YELLOW  YELLOW    APPearance CLEAR  CLEAR    Specific Gravity, Urine 1.010  1.005 - 1.030    pH 5.5  5.0 - 8.0    Glucose, UA NEGATIVE  NEGATIVE mg/dL    Hgb urine dipstick NEGATIVE  NEGATIVE    Bilirubin Urine NEGATIVE  NEGATIVE    Ketones, ur NEGATIVE  NEGATIVE mg/dL    Protein, ur NEGATIVE  NEGATIVE mg/dL    Urobilinogen, UA 0.2  0.0 - 1.0 mg/dL    Nitrite NEGATIVE  NEGATIVE    Leukocytes, UA NEGATIVE  NEGATIVE MICROSCOPIC NOT DONE ON URINES WITH NEGATIVE PROTEIN, BLOOD, LEUKOCYTES, NITRITE, OR GLUCOSE <1000 mg/dL.  VITAMIN B12     Status: Abnormal   Collection Time   05/24/12 11:07 PM      Component Value Range Comment   Vitamin B-12 1215 (*)  211 - 911 pg/mL   FOLATE     Status: Normal   Collection Time   05/24/12 11:07 PM      Component Value Range Comment   Folate 15.1     IRON AND TIBC     Status: Abnormal   Collection Time   05/24/12 11:07 PM      Component Value Range Comment   Iron <10 (*) 42 - 135 ug/dL    TIBC Not calculated due to Iron <10.  250 - 470 ug/dL    Saturation Ratios Not calculated due to Iron <10.  20 - 55 %    UIBC 531 (*) 125 - 400 ug/dL   FERRITIN     Status: Normal   Collection Time   05/24/12 11:07 PM      Component Value Range Comment   Ferritin 11  10 - 291 ng/mL   PREPARE RBC (CROSSMATCH)     Status: Normal   Collection Time   05/25/12  1:30 AM      Component Value Range Comment   Order Confirmation ORDER PROCESSED BY BLOOD BANK     TYPE AND SCREEN     Status: Normal   Collection Time   05/25/12  1:50 AM      Component Value Range Comment   ABO/RH(D) O POS      Antibody Screen NEG      Sample Expiration 05/28/2012      Unit Number [id number]      Blood Component Type RBC LR PHER1      Unit division 00      Status of Unit ISSUED,FINAL      Transfusion Status OK TO TRANSFUSE      Crossmatch Result Compatible      Unit Number [id number]      Blood Component Type RBC LR PHER1      Unit division 00      Status of Unit ISSUED,FINAL      Transfusion Status OK TO TRANSFUSE      Crossmatch Result Compatible     MRSA PCR SCREENING     Status: Normal   Collection Time   05/25/12  5:49 AM      Component Value Range Comment   MRSA by PCR NEGATIVE  NEGATIVE   HEMOGLOBIN AND HEMATOCRIT, BLOOD     Status: Abnormal   Collection Time  05/25/12 10:45 AM      Component Value Range Comment   Hemoglobin 8.5 (*) 12.0 - 15.0 g/dL    HCT 16.1 (*) 09.6 - 46.0 %   BASIC METABOLIC PANEL     Status: Abnormal   Collection Time   05/25/12 10:45 AM      Component Value Range Comment   Sodium 136  135 - 145 mEq/L    Potassium 3.3 (*) 3.5 - 5.1 mEq/L    Chloride 101  96 - 112 mEq/L    CO2 24  19 - 32 mEq/L     Glucose, Bld 135 (*) 70 - 99 mg/dL    BUN 21  6 - 23 mg/dL    Creatinine, Ser 0.45 (*) 0.50 - 1.10 mg/dL    Calcium 9.2  8.4 - 40.9 mg/dL    GFR calc non Af Amer 50 (*) >90 mL/min    GFR calc Af Amer 58 (*) >90 mL/min   CBC     Status: Abnormal   Collection Time   05/26/12  4:51 AM      Component Value Range Comment   WBC 10.9 (*) 4.0 - 10.5 K/uL    RBC 4.41  3.87 - 5.11 MIL/uL    Hemoglobin 9.1 (*) 12.0 - 15.0 g/dL    HCT 81.1 (*) 91.4 - 46.0 %    MCV 69.4 (*) 78.0 - 100.0 fL    MCH 20.6 (*) 26.0 - 34.0 pg    MCHC 29.7 (*) 30.0 - 36.0 g/dL    RDW 78.2 (*) 95.6 - 15.5 %    Platelets 586 (*) 150 - 400 K/uL   BASIC METABOLIC PANEL     Status: Abnormal   Collection Time   05/26/12  4:51 AM      Component Value Range Comment   Sodium 140  135 - 145 mEq/L    Potassium 3.5  3.5 - 5.1 mEq/L    Chloride 101  96 - 112 mEq/L    CO2 27  19 - 32 mEq/L    Glucose, Bld 100 (*) 70 - 99 mg/dL    BUN 23  6 - 23 mg/dL    Creatinine, Ser 2.13 (*) 0.50 - 1.10 mg/dL    Calcium 9.6  8.4 - 08.6 mg/dL    GFR calc non Af Amer 49 (*) >90 mL/min    GFR calc Af Amer 56 (*) >90 mL/min      HPI : 61 year old with chronic diastolic heart failure atrial fibrillation who presents to the emergency room with complaints of swelling in bilateral lower extremities for several days. The patient denies any shortness of breath, chest pain, PND, orthopnea. Chest x-ray shows evidence of pulmonary edema which correlates with an elevated pro BNP of 2,614.Patient also admits to dizziness upon standing which she noted it began to occur today appear. During her evaluation in the emergency room the patient was found to have a hemoglobin of 6.8 which is significantly diminished from her hemoglobin of 8.7 approximately 1-1/2 months ago. The patient does have a history of hemorrhoids which requires surgical intervention the past. Over the patient denies seeing any bright red blood per rectum or having any melanotic stools.  We are  asked to see the patient for further evaluation and management. I spoke with patient's daughter and updated her and the patient's current clinical condition. The patient's daughter states the patient has difficulty with memory and asked that one of her children be contacted for medical information  to be indicated. The patient concurs with this.     HOSPITAL COURSE:  #1 shortness of breath secondary to acute on chronic diastolic heart failure in the setting of anemia. Last known ejection fraction in April of 2013 was 55-60% with mild LVH. Patient was recently advised to decrease the dose of Lasix to every other day. The patient was admitted and diuresed aggressively with IV Lasix. With good urine output. The swelling in her legs and shortness of breath have improved. She will need repeat BMP  in 1 week. Her Lasix has been increased to twice a day when necessary a prescription has been provided  #2 atrial fibrillation rate controlled currently on metoprolol, not a candidate for anticoagulation because of anemia  #3 anemia significant anemia microcytic without any evidence of GI bleeding status post 2 units of packed red blood cells. Iron level was less than 10. Vitamin B12 and folate were normal. She will need age appropriate screening colonoscopy. This is to be scheduled in the outpatient setting. Stool guaiac was negative.  #4 hypertension blood pressure was mildly low prior to discharge with systolic documented at 87. The patient was completely asymptomatic. The patient has been advised to discontinue her hydralazine, and now she'll be receiving high doses of Lasix.    Discharge Exam:  Blood pressure 110/60, pulse 81, temperature 98.5 F (36.9 C), temperature source Oral, resp. rate 16, height 5\' 5"  (1.651 m), weight 86.2 kg (190 lb 0.6 oz), SpO2 91.00%.  General: Alert oriented x3, speech clear, well nourished NSD  Cardiovascular: RRR, No MGR, trace LEE PPP  Respiratory: Normal effort. BSCTAB.  No rhonchi, wheeze  Abdomen: obese, soft +BS    Discharge Orders    Future Orders Please Complete By Expires   Diet - low sodium heart healthy      Scheduling Instructions:   Fluids to 1500 cc per day   Increase activity slowly      Call MD for:  persistant nausea and vomiting      Call MD for:  difficulty breathing, headache or visual disturbances      Call MD for:  persistant dizziness or light-headedness      Call MD for:  extreme fatigue      (HEART FAILURE PATIENTS) Call MD:  Anytime you have any of the following symptoms: 1) 3 pound weight gain in 24 hours or 5 pounds in 1 week 2) shortness of breath, with or without a dry hacking cough 3) swelling in the hands, feet or stomach 4) if you have to sleep on extra pillows at night in order to breathe.         Follow-up Information    Follow up with Primary care provider. Schedule an appointment as soon as possible for a visit in 1 week. (Followup BMP)          Signed: Richarda Overlie 05/26/2012, 11:41 AM

## 2012-10-01 ENCOUNTER — Emergency Department (HOSPITAL_COMMUNITY): Payer: Medicare Other

## 2012-10-01 ENCOUNTER — Inpatient Hospital Stay (HOSPITAL_COMMUNITY)
Admission: EM | Admit: 2012-10-01 | Discharge: 2012-10-03 | DRG: 293 | Disposition: A | Discharge status: Home / Self Care | Payer: Medicare Other | Attending: Internal Medicine | Admitting: Internal Medicine

## 2012-10-01 ENCOUNTER — Encounter (HOSPITAL_COMMUNITY): Payer: Self-pay | Admitting: Emergency Medicine

## 2012-10-01 DIAGNOSIS — F17200 Nicotine dependence, unspecified, uncomplicated: Secondary | ICD-10-CM | POA: Diagnosis present

## 2012-10-01 DIAGNOSIS — J189 Pneumonia, unspecified organism: Secondary | ICD-10-CM

## 2012-10-01 DIAGNOSIS — Z9189 Other specified personal risk factors, not elsewhere classified: Secondary | ICD-10-CM

## 2012-10-01 DIAGNOSIS — I509 Heart failure, unspecified: Secondary | ICD-10-CM

## 2012-10-01 DIAGNOSIS — I5033 Acute on chronic diastolic (congestive) heart failure: Principal | ICD-10-CM | POA: Diagnosis present

## 2012-10-01 DIAGNOSIS — Z8679 Personal history of other diseases of the circulatory system: Secondary | ICD-10-CM

## 2012-10-01 DIAGNOSIS — E785 Hyperlipidemia, unspecified: Secondary | ICD-10-CM | POA: Diagnosis present

## 2012-10-01 DIAGNOSIS — F209 Schizophrenia, unspecified: Secondary | ICD-10-CM

## 2012-10-01 DIAGNOSIS — R079 Chest pain, unspecified: Secondary | ICD-10-CM | POA: Diagnosis not present

## 2012-10-01 DIAGNOSIS — R001 Bradycardia, unspecified: Secondary | ICD-10-CM

## 2012-10-01 DIAGNOSIS — I4891 Unspecified atrial fibrillation: Secondary | ICD-10-CM | POA: Diagnosis present

## 2012-10-01 DIAGNOSIS — F329 Major depressive disorder, single episode, unspecified: Secondary | ICD-10-CM

## 2012-10-01 DIAGNOSIS — D72829 Elevated white blood cell count, unspecified: Secondary | ICD-10-CM

## 2012-10-01 DIAGNOSIS — I10 Essential (primary) hypertension: Secondary | ICD-10-CM

## 2012-10-01 DIAGNOSIS — N183 Chronic kidney disease, stage 3 unspecified: Secondary | ICD-10-CM | POA: Diagnosis present

## 2012-10-01 DIAGNOSIS — F17210 Nicotine dependence, cigarettes, uncomplicated: Secondary | ICD-10-CM | POA: Diagnosis present

## 2012-10-01 DIAGNOSIS — M109 Gout, unspecified: Secondary | ICD-10-CM

## 2012-10-01 DIAGNOSIS — K219 Gastro-esophageal reflux disease without esophagitis: Secondary | ICD-10-CM | POA: Diagnosis present

## 2012-10-01 DIAGNOSIS — F39 Unspecified mood [affective] disorder: Secondary | ICD-10-CM | POA: Diagnosis present

## 2012-10-01 DIAGNOSIS — D509 Iron deficiency anemia, unspecified: Secondary | ICD-10-CM | POA: Diagnosis present

## 2012-10-01 DIAGNOSIS — K644 Residual hemorrhoidal skin tags: Secondary | ICD-10-CM

## 2012-10-01 DIAGNOSIS — I129 Hypertensive chronic kidney disease with stage 1 through stage 4 chronic kidney disease, or unspecified chronic kidney disease: Secondary | ICD-10-CM | POA: Diagnosis present

## 2012-10-01 HISTORY — DX: Chronic kidney disease, stage 3 (moderate): N18.3

## 2012-10-01 HISTORY — DX: Chronic kidney disease, stage 3 unspecified: N18.30

## 2012-10-01 LAB — PRO B NATRIURETIC PEPTIDE: Pro B Natriuretic peptide (BNP): 1975 pg/mL — ABNORMAL HIGH (ref 0–125)

## 2012-10-01 LAB — POCT I-STAT TROPONIN I: Troponin i, poc: 0.01 ng/mL (ref 0.00–0.08)

## 2012-10-01 LAB — CBC
HCT: 31.9 % — ABNORMAL LOW (ref 36.0–46.0)
Hemoglobin: 9.4 g/dL — ABNORMAL LOW (ref 12.0–15.0)
MCH: 18.9 pg — ABNORMAL LOW (ref 26.0–34.0)
MCH: 19.2 pg — ABNORMAL LOW (ref 26.0–34.0)
MCHC: 29.1 g/dL — ABNORMAL LOW (ref 30.0–36.0)
MCV: 65.2 fL — ABNORMAL LOW (ref 78.0–100.0)
Platelets: 455 10*3/uL — ABNORMAL HIGH (ref 150–400)
RBC: 4.71 MIL/uL (ref 3.87–5.11)
RBC: 4.89 MIL/uL (ref 3.87–5.11)
RDW: 20.9 % — ABNORMAL HIGH (ref 11.5–15.5)

## 2012-10-01 LAB — HEPATIC FUNCTION PANEL
AST: 23 U/L (ref 0–37)
Bilirubin, Direct: <0.1 mg/dL (ref 0.0–0.3)
Total Bilirubin: 0.2 mg/dL — ABNORMAL LOW (ref 0.3–1.2)

## 2012-10-01 LAB — BASIC METABOLIC PANEL
Calcium: 9 mg/dL (ref 8.4–10.5)
GFR calc non Af Amer: 46 mL/min — ABNORMAL LOW (ref >90)
Sodium: 135 mEq/L (ref 135–145)

## 2012-10-01 LAB — MRSA PCR SCREENING: MRSA by PCR: NEGATIVE

## 2012-10-01 MED ORDER — ONDANSETRON HCL 4 MG/2ML IJ SOLN: 4.0000 mg | Freq: Four times a day (QID) | INTRAMUSCULAR | Status: DC | PRN

## 2012-10-01 MED ORDER — SODIUM CHLORIDE 0.9 % IV SOLN: Freq: Once | INTRAVENOUS | Status: DC

## 2012-10-01 MED ORDER — CITALOPRAM HYDROBROMIDE 20 MG PO TABS
20.0000 mg | ORAL_TABLET | Freq: Every morning | ORAL | Status: DC
  Administered 2012-10-02 – 2012-10-03 (×2): 20 mg via ORAL
  Filled 2012-10-01 (×2): qty 1

## 2012-10-01 MED ORDER — LORATADINE 10 MG PO TABS
10.0000 mg | ORAL_TABLET | Freq: Every day | ORAL | Status: DC
  Administered 2012-10-02 – 2012-10-03 (×2): 10 mg via ORAL
  Filled 2012-10-01 (×2): qty 1

## 2012-10-01 MED ORDER — ONDANSETRON HCL 4 MG PO TABS: 4.0000 mg | ORAL_TABLET | Freq: Four times a day (QID) | ORAL | Status: DC | PRN

## 2012-10-01 MED ORDER — SODIUM CHLORIDE 0.9 % IJ SOLN: 3.0000 mL | INTRAMUSCULAR | Status: DC | PRN

## 2012-10-01 MED ORDER — HYDROCODONE-ACETAMINOPHEN 5-325 MG PO TABS
1.0000 | ORAL_TABLET | ORAL | Status: DC | PRN
  Administered 2012-10-01 – 2012-10-02 (×2): 1 via ORAL
  Filled 2012-10-01 (×2): qty 1

## 2012-10-01 MED ORDER — CALCIUM CARBONATE-VITAMIN D 500-200 MG-UNIT PO TABS
1.0000 | ORAL_TABLET | Freq: Every day | ORAL | Status: DC
  Administered 2012-10-02 – 2012-10-03 (×2): 1 via ORAL
  Filled 2012-10-01 (×2): qty 1

## 2012-10-01 MED ORDER — METOCLOPRAMIDE HCL 5 MG PO TABS
5.0000 mg | ORAL_TABLET | Freq: Three times a day (TID) | ORAL | Status: DC
Start: 2012-10-01 — End: 2012-10-03
  Administered 2012-10-01 – 2012-10-03 (×7): 5 mg via ORAL
  Filled 2012-10-01 (×10): qty 1

## 2012-10-01 MED ORDER — DOCUSATE SODIUM 100 MG PO CAPS
100.0000 mg | ORAL_CAPSULE | Freq: Every day | ORAL | Status: DC
  Administered 2012-10-02 – 2012-10-03 (×2): 100 mg via ORAL
  Filled 2012-10-01 (×2): qty 1

## 2012-10-01 MED ORDER — SODIUM CHLORIDE 0.9 % IJ SOLN
3.0000 mL | Freq: Two times a day (BID) | INTRAMUSCULAR | Status: DC
  Administered 2012-10-01 – 2012-10-03 (×4): 3 mL via INTRAVENOUS

## 2012-10-01 MED ORDER — SODIUM CHLORIDE 0.9 % IJ SOLN: 3.0000 mL | Freq: Two times a day (BID) | INTRAMUSCULAR | Status: DC

## 2012-10-01 MED ORDER — POTASSIUM CHLORIDE CRYS ER 20 MEQ PO TBCR
20.0000 meq | EXTENDED_RELEASE_TABLET | Freq: Two times a day (BID) | ORAL | Status: DC
  Administered 2012-10-01 – 2012-10-03 (×4): 20 meq via ORAL
  Filled 2012-10-01 (×5): qty 1

## 2012-10-01 MED ORDER — FERROUS SULFATE 325 (65 FE) MG PO TABS
325.0000 mg | ORAL_TABLET | Freq: Two times a day (BID) | ORAL | Status: DC
  Administered 2012-10-01 – 2012-10-03 (×4): 325 mg via ORAL
  Filled 2012-10-01 (×6): qty 1

## 2012-10-01 MED ORDER — ACETAMINOPHEN 325 MG PO TABS: 650.0000 mg | ORAL_TABLET | Freq: Four times a day (QID) | ORAL | Status: DC | PRN

## 2012-10-01 MED ORDER — MORPHINE SULFATE 4 MG/ML IJ SOLN
4.0000 mg | Freq: Once | INTRAMUSCULAR | Status: AC
  Administered 2012-10-01: 4 mg via INTRAVENOUS
  Filled 2012-10-01: qty 1

## 2012-10-01 MED ORDER — FUROSEMIDE 10 MG/ML IJ SOLN
40.0000 mg | Freq: Two times a day (BID) | INTRAMUSCULAR | Status: DC
  Administered 2012-10-01 – 2012-10-03 (×4): 40 mg via INTRAVENOUS
  Filled 2012-10-01 (×6): qty 4

## 2012-10-01 MED ORDER — FUROSEMIDE 10 MG/ML IJ SOLN
20.0000 mg | Freq: Once | INTRAMUSCULAR | Status: AC
  Administered 2012-10-01: 20 mg via INTRAVENOUS
  Filled 2012-10-01: qty 4

## 2012-10-01 MED ORDER — SODIUM CHLORIDE 0.9 % IV SOLN: 250.0000 mL | INTRAVENOUS | Status: DC | PRN

## 2012-10-01 MED ORDER — ASPIRIN EC 325 MG PO TBEC
325.0000 mg | DELAYED_RELEASE_TABLET | Freq: Every day | ORAL | Status: DC
  Administered 2012-10-02 – 2012-10-03 (×2): 325 mg via ORAL
  Filled 2012-10-01 (×2): qty 1

## 2012-10-01 MED ORDER — ENOXAPARIN SODIUM 40 MG/0.4ML ~~LOC~~ SOLN
40.0000 mg | SUBCUTANEOUS | Status: DC
  Administered 2012-10-01 – 2012-10-02 (×2): 40 mg via SUBCUTANEOUS
  Filled 2012-10-01 (×3): qty 0.4

## 2012-10-01 MED ORDER — ALUM & MAG HYDROXIDE-SIMETH 200-200-20 MG/5ML PO SUSP: 30.0000 mL | Freq: Four times a day (QID) | ORAL | Status: DC | PRN

## 2012-10-01 MED ORDER — ACETAMINOPHEN 650 MG RE SUPP: 650.0000 mg | Freq: Four times a day (QID) | RECTAL | Status: DC | PRN

## 2012-10-01 MED ORDER — TRAMADOL HCL 50 MG PO TABS
50.0000 mg | ORAL_TABLET | Freq: Three times a day (TID) | ORAL | Status: DC | PRN
  Administered 2012-10-01: 50 mg via ORAL
  Filled 2012-10-01: qty 1

## 2012-10-01 MED ORDER — NICOTINE 14 MG/24HR TD PT24
14.0000 mg | MEDICATED_PATCH | Freq: Every day | TRANSDERMAL | Status: DC
  Administered 2012-10-01 – 2012-10-03 (×3): 14 mg via TRANSDERMAL
  Filled 2012-10-01 (×3): qty 1

## 2012-10-01 MED ORDER — PNEUMOCOCCAL VAC POLYVALENT 25 MCG/0.5ML IJ INJ: 0.5000 mL | INJECTION | INTRAMUSCULAR | Status: DC | PRN

## 2012-10-01 MED ORDER — TEMAZEPAM 15 MG PO CAPS
15.0000 mg | ORAL_CAPSULE | Freq: Every evening | ORAL | Status: DC | PRN
  Administered 2012-10-02: 15 mg via ORAL
  Filled 2012-10-01: qty 1

## 2012-10-01 MED ORDER — METOPROLOL TARTRATE 12.5 MG HALF TABLET
12.5000 mg | ORAL_TABLET | Freq: Two times a day (BID) | ORAL | Status: DC
  Administered 2012-10-01: 12.5 mg via ORAL
  Filled 2012-10-01 (×3): qty 1

## 2012-10-01 MED ORDER — PANTOPRAZOLE SODIUM 40 MG PO TBEC
40.0000 mg | DELAYED_RELEASE_TABLET | Freq: Every day | ORAL | Status: DC
  Administered 2012-10-02 – 2012-10-03 (×2): 40 mg via ORAL
  Filled 2012-10-01 (×2): qty 1

## 2012-10-01 MED ORDER — LOSARTAN POTASSIUM 25 MG PO TABS
25.0000 mg | ORAL_TABLET | Freq: Every day | ORAL | Status: DC
  Administered 2012-10-02 – 2012-10-03 (×2): 25 mg via ORAL
  Filled 2012-10-01 (×2): qty 1

## 2012-10-01 MED ORDER — AMIODARONE HCL 200 MG PO TABS
200.0000 mg | ORAL_TABLET | Freq: Every day | ORAL | Status: DC
  Administered 2012-10-02 – 2012-10-03 (×2): 200 mg via ORAL
  Filled 2012-10-01 (×2): qty 1

## 2012-10-01 MED ORDER — CALCIUM + D3 600-200 MG-UNIT PO TABS: 1.0000 | ORAL_TABLET | Freq: Every day | ORAL | Status: DC

## 2012-10-01 NOTE — Progress Notes (Signed)
Patient had 1.7 missed beat on the monitor. Vitals 141/88, 84, 92%-RA. No chest pain  but joint pain, PRN Vicodin given. Mary Lynch-NP notified, no new order given will continue to assess patient.

## 2012-10-01 NOTE — H&P (Addendum)
[facility] History and Physical  Joy SODERMAN RUE:[id number] DOB: Jul 01, 1951 DOA: 10/01/2012  Referring physician: Magnus Sinning, PA-C PCP: Georganna Skeans, MD   Chief Complaint: Chest pain   History of Present Illness: [name] is an 61 y.o. female with a PMH of atrial fibrillation, HTN, hyperlipidemia and borderline DM who presents with a 3-4 day history of chest pain, left sided, that came on suddenly.  The patient does not recall what she was doing when the pain 1st came on.  Pain has been intermittent, rated a 10/10 at its worst, and radiates across chest to the left side.  Had some mild nausea when pain first came on, but none since.  Occasional diaphoresis.  Also reports myalgias/arthralgias.    Review of Systems: Constitutional: No fever, + chills;  Appetite normal; No weight loss, + 15-20 lb weight gain in past 6 months.  HEENT: No blurry vision, no diplopia, no pharyngitis, no dysphagia CV: + chest pain, no palpitations.  Resp: + SOB, + cough productive of dark mucous. GI: + nausea, no vomiting, no diarrhea, no melena, no hematochezia.  GU: No dysuria, no hematuria.  MSK: + myalgias and arthralgias.  Neuro:  + headache, no focal neurological deficits, no history of seizures.  Psych: + depression, and anxiety.  Endo: No thyroid disease, + borderline DM, no heat intolerance, no cold intolerance, no polyuria, no polydipsia  Skin: No rashes, no skin lesions.  Heme: No easy bruising, no history of blood diseases.  Past Medical History Past Medical History  Diagnosis Date  . Hypertension   . Borderline diabetes   . Irregular heart beat   . Hyperlipidemia   . CHF (congestive heart failure)      Past Surgical History Past Surgical History  Procedure Date  . Hemorrhoid surgery      Social History: History   Social History  . Marital Status: Single    Spouse Name: N/A    Number of Children: N/A  . Years of Education: N/A   Occupational History  .  Disabled.  Worked as a Catering manager History Main Topics  . Smoking status: Current Every Day Smoker -- 0.5 packs/day for 45 years    Types: Cigarettes  . Smokeless tobacco: Current User  . Alcohol Use: No  . Drug Use: Yes     Comment: Occasional marijuana  . Sexually Active: No   Other Topics Concern  . Not on file   Social History Narrative   Divorced.  Lives alone.  Independent of ADLs, daughter helps with house management.    Family History:  Family History  Problem Relation Age of Onset  . Kidney disease Mother   . Heart attack Mother   . Kidney disease Father   . Hypertension Mother   . Breast cancer Daughter   . Heart disease Brother     Allergies: Penicillins  Meds: Prior to Admission medications   Medication Sig Start Date End Date Taking? Authorizing Provider  amiodarone (PACERONE) 200 MG tablet Take 200 mg by mouth daily.   Yes Historical Provider, MD  aspirin 325 MG EC tablet Take 325 mg by mouth daily.   Yes Historical Provider, MD  Calcium Carb-Cholecalciferol (CALCIUM + D3 PO) Take 1 tablet by mouth daily.   Yes Historical Provider, MD  citalopram (CELEXA) 20 MG tablet Take 20 mg by mouth every morning.    Yes Historical Provider, MD  docusate sodium (COLACE) 100 MG capsule Take 100 mg by mouth  daily.    Yes Historical Provider, MD  furosemide (LASIX) 40 MG tablet Take 40 mg by mouth daily.   Yes Historical Provider, MD  Iron Combinations (IRON COMPLEX PO) Take 50 mg by mouth daily.   Yes Historical Provider, MD  loratadine (CLARITIN) 10 MG tablet Take 10 mg by mouth daily.   Yes Historical Provider, MD  losartan (COZAAR) 25 MG tablet Take 25 mg by mouth daily.   Yes Historical Provider, MD  metoCLOPramide (REGLAN) 5 MG tablet Take 5 mg by mouth 4 (four) times daily.   Yes Historical Provider, MD  metoprolol tartrate (LOPRESSOR) 25 MG tablet Take 12.5 mg by mouth 2 (two) times daily.   Yes Historical Provider, MD  pantoprazole (PROTONIX) 40 MG tablet Take  40 mg by mouth daily.   Yes Historical Provider, MD  potassium chloride SA (K-DUR,KLOR-CON) 20 MEQ tablet Take 1 tablet (20 mEq total) by mouth 2 (two) times daily. 05/26/12  Yes Richarda Overlie, MD  temazepam (RESTORIL) 15 MG capsule Take 15 mg by mouth at bedtime as needed. For sleep.   Yes Historical Provider, MD  traMADol (ULTRAM) 50 MG tablet Take 50 mg by mouth every 8 (eight) hours as needed. For pain relief   Yes Historical Provider, MD    Physical Exam: Filed Vitals:   10/01/12 1313 10/01/12 1323 10/01/12 1502 10/01/12 1530  BP:   120/79 135/93  Pulse:  44 50 47  Temp:      TempSrc:      Resp:  21 22 22   SpO2: 94%  97%      Physical Exam: Blood pressure 135/93, pulse 47, temperature 98.6 F (37 C), temperature source Oral, resp. rate 22, SpO2 97.00%. Gen: NAD Head: Normocephalic, atraumatic Eyes: PERRL, EOMI, cloudy lenses and arcus senilis Mouth:  Oropharynx clear, teeth in fair condition Neck: No thyromegaly, no lymphadenopathy, no JVD Chest: Lungs clear to ascultation bilaterally CV: Currently RRR, S1, S2 with II/VI SEM LUSB Abdomen: Soft, NT/ND, +BS Extremities: No C/E/C Skin: Dry but otherwise normal Neuro: A+Ox3, steady gait, CN II-XII grossly intact Psych: Mood and affect normal  Labs on Admission:  Basic Metabolic Panel:  Lab 10/01/12 1610  NA 135  K 4.2  CL 100  CO2 23  GLUCOSE 117*  BUN 10  CREATININE 1.23*  CALCIUM 9.0  MG --  PHOS --   CBC:  Lab 10/01/12 1300  WBC 10.0  NEUTROABS --  HGB 8.9*  HCT 30.6*  MCV 65.0*  PLT 455*    BNP (last 3 results)  Basename 10/01/12 1300 05/24/12 1634 03/04/12 0510  PROBNP 1975.0* 2614.0* 2374.0*    Radiological Exams on Admission:  Dg Chest Port 1 View 10/01/2012  IMPRESSION: Enlargement of cardiac silhouette with pulmonary vascular congestion and probable mild pulmonary edema.   Original Report Authenticated By: Ulyses Southward, M.D.     EKG: Independently reviewed. Sinus bradycardia at 45 bpm;  Earlier EKG showed atrial fibrillation with RVR at 124 bpm and non-specific T wave abnormalities.  Assessment/Plan Principal Problem:  *Acute on chronic diastolic CHF (congestive heart failure)  Diurese (on 40 mg lasix PO daily at home, increase to 40 mg IV Q 12 hours until diuresed).  Monitor on telemetry.  Cycle troponins Q 6 hours x 3.  Continue ASA. Active Problems:  HYPERLIPIDEMIA  Check FLP.  Cholesterol 113 01/14/11.  TOBACCO USER  Tobacco cessation counseling; Nicotine patch.  HYPERTENSION  Continue lasix, cozaar, metoprolol.  CANNABIS ABUSE, HX OF  Counsel.  Atrial fibrillation  Initial EKG showed afib w/ RVR, now in NSR with bradycardia.  Monitor for SSS.  May need to involve cardiology.  On ASA.  May need to consider coumadin, but was not felt to be a suitable candidate in the past.  Microcytic anemia  Iron deficient based on prior studies.  Supplement.  Code Status: Full. Family Communication: None at bedside. Disposition Plan: Home when stable.  Time spent: 1 hour.  Kelley Knoth [facility] Pager [phone]  If 7PM-7AM, please contact night-coverage [email or web address] Password [facility] 10/01/2012, 4:44 PM

## 2012-10-01 NOTE — ED Notes (Signed)
Pt presenting to ed with c/o chest pain x 3-4 days with positive shortness of breath pt denies nausea and vomiting at this time. Pt states "I do get lightheaded from time to time"

## 2012-10-01 NOTE — ED Notes (Signed)
First attempt to call report. Floor rn will call back 

## 2012-10-01 NOTE — ED Provider Notes (Signed)
History     CSN: [id number]  Arrival date & time 10/01/12  1205   First MD Initiated Contact with Patient 10/01/12 1345      Chief Complaint  Patient presents with  . Chest Pain    (Consider location/radiation/quality/duration/timing/severity/associated sxs/prior treatment) HPI Comments: Patient presents today with a chief complain of chest pain.  Pain has been present for the past 3-4 days and is gradually worsening.  CP associated with SOB, DOE, and orthopnea.  She denies swelling of her lower extremities.  She has a history of Diastolic CHF and is currently on Furosemide 40 mg daily.  She reports that she has been taking the medication as directed.  She also has a history of atrial fibrillation.  She is currently not on an anticoagulant due to past history of GI bleed.  Her atrial fibrillation is managed by rate control with Amiodarone.  She currently takes 325 mg Aspirin daily.  She took last dose this morning.  She denies palpitations.  Patient also reports that she has had a productive cough for months.  She currently does smoke.  She denies fever, but states that she has had chills over the past few days.  She does not have a Cardiologist.  No prior history of known CAD.    The history is provided by the patient.    Past Medical History  Diagnosis Date  . Hypertension   . Borderline diabetes   . Irregular heart beat   . Hyperlipidemia     Past Surgical History  Procedure Date  . Hemmoroid     History reviewed. No pertinent family history.  History  Substance Use Topics  . Smoking status: Current Every Day Smoker    Types: Cigarettes  . Smokeless tobacco: Current User  . Alcohol Use: No    OB History    Grav Para Term Preterm Abortions TAB SAB Ect Mult Living                  Review of Systems  Constitutional: Negative for fever and chills.  Respiratory: Positive for shortness of breath.   Cardiovascular: Positive for chest pain. Negative for leg swelling.    Gastrointestinal: Negative for nausea, vomiting and abdominal pain.  Neurological: Negative for dizziness, syncope and light-headedness.    Allergies  Penicillins  Home Medications   Current Outpatient Rx  Name  Route  Sig  Dispense  Refill  . AMIODARONE HCL 200 MG PO TABS   Oral   Take 200 mg by mouth daily.         . ASPIRIN 325 MG PO TBEC   Oral   Take 325 mg by mouth daily.         Marland Kitchen CALCIUM + D3 PO   Oral   Take 1 tablet by mouth daily.         Marland Kitchen CITALOPRAM HYDROBROMIDE 20 MG PO TABS   Oral   Take 20 mg by mouth every morning.          Marland Kitchen DOCUSATE SODIUM 100 MG PO CAPS   Oral   Take 100 mg by mouth daily.          . FUROSEMIDE 40 MG PO TABS   Oral   Take 40 mg by mouth daily.         . IRON COMPLEX PO   Oral   Take 50 mg by mouth daily.         Marland Kitchen LORATADINE 10 MG PO TABS  Oral   Take 10 mg by mouth daily.         Marland Kitchen LOSARTAN POTASSIUM 25 MG PO TABS   Oral   Take 25 mg by mouth daily.         Marland Kitchen METOCLOPRAMIDE HCL 5 MG PO TABS   Oral   Take 5 mg by mouth 4 (four) times daily.         Marland Kitchen METOPROLOL TARTRATE 25 MG PO TABS   Oral   Take 12.5 mg by mouth 2 (two) times daily.         Marland Kitchen PANTOPRAZOLE SODIUM 40 MG PO TBEC   Oral   Take 40 mg by mouth daily.         Marland Kitchen POTASSIUM CHLORIDE CRYS ER 20 MEQ PO TBCR   Oral   Take 1 tablet (20 mEq total) by mouth 2 (two) times daily.   60 tablet   0   . TEMAZEPAM 15 MG PO CAPS   Oral   Take 15 mg by mouth at bedtime as needed. For sleep.         Marland Kitchen TRAMADOL HCL 50 MG PO TABS   Oral   Take 50 mg by mouth every 8 (eight) hours as needed. For pain relief           BP 126/68  Pulse 44  Temp 98.6 F (37 C) (Oral)  Resp 21  SpO2 94%  Physical Exam  Nursing note and vitals reviewed. Constitutional: She appears well-developed and well-nourished. No distress.  HENT:  Head: Normocephalic and atraumatic.  Mouth/Throat: Oropharynx is clear and moist.  Eyes: EOM are normal.  Pupils are equal, round, and reactive to light.  Neck: Normal range of motion. Neck supple.  Cardiovascular: Normal rate, regular rhythm, normal heart sounds and intact distal pulses.   Pulmonary/Chest: Effort normal and breath sounds normal. No respiratory distress. She has no wheezes. She has no rales.  Abdominal: Soft. There is no tenderness.  Musculoskeletal:       Trace pitting edema of both extremities bilaterally  Neurological: She is alert.  Skin: Skin is warm and dry. No rash noted. She is not diaphoretic.  Psychiatric: She has a normal mood and affect.    ED Course  Procedures (including critical care time)  Labs Reviewed  CBC - Abnormal; Notable for the following:    Hemoglobin 8.9 (*)     HCT 30.6 (*)     MCV 65.0 (*)     MCH 18.9 (*)     MCHC 29.1 (*)     RDW 20.9 (*)     Platelets 455 (*)     All other components within normal limits  BASIC METABOLIC PANEL - Abnormal; Notable for the following:    Glucose, Bld 117 (*)     Creatinine, Ser 1.23 (*)     GFR calc non Af Amer 46 (*)     GFR calc Af Amer 53 (*)     All other components within normal limits  PRO B NATRIURETIC PEPTIDE - Abnormal; Notable for the following:    Pro B Natriuretic peptide (BNP) 1975.0 (*)     All other components within normal limits  POCT I-STAT TROPONIN I   Dg Chest Port 1 View  10/01/2012  *RADIOLOGY REPORT*  Clinical Data: Chest pain, hypertension  PORTABLE CHEST - 1 VIEW  Comparison: Portable exam 1235 hours compared to 05/24/2012  Findings: Enlargement of cardiac silhouette with pulmonary vascular congestion. Mild accentuation of  interstitial markings in the perihilar regions extending to the bases, greater on the right, likely mild pulmonary edema. No segmental consolidation, pleural effusion or pneumothorax. Bones demineralized.  IMPRESSION: Enlargement of cardiac silhouette with pulmonary vascular congestion and probable mild pulmonary edema.   Original Report Authenticated By: Ulyses Southward, M.D.      No diagnosis found.   Date: 10/01/2012  Rate: 45  Rhythm: sinus bradycardia  QRS Axis: normal  Intervals: normal  ST/T Wave abnormalities: normal  Conduction Disutrbances:none  Narrative Interpretation:   Old EKG Reviewed: changes noted, previous EKG showed atrial fibrillation with HR of 124.  Discussed with Dr. Darnelle Catalan who has agreed to admit the patient.    MDM  Patient presenting today with a chief complaint of chest pain and SOB for the past 3-4 days.  She has a history of Atrial Fibrillation and Diastolic CHF.  Initially she was in afib with RVR and a heart rate of 124.  Her heart rate than went down to 50 without intervention.   Her initial troponin was negative.  Her BNP was elevated at 1975.  CXR showing cardiomegaly with pulmonary vascular congestion and probable mild pulmonary edema.  Patient given Lasix 20mg  IV while in the ED.  Hospitalist was therefore, called for admission due to CHF exacerbation, chest pain, and bradycardia.  Dr. Darnelle Catalan with [facility] has agreed to admit the patient.          Pascal Lux Trion, PA-C 10/02/12 [phone]

## 2012-10-01 NOTE — ED Notes (Signed)
Report given to jennifer, rn on floor.  

## 2012-10-02 ENCOUNTER — Encounter (HOSPITAL_COMMUNITY): Payer: Self-pay | Admitting: Internal Medicine

## 2012-10-02 DIAGNOSIS — I498 Other specified cardiac arrhythmias: Secondary | ICD-10-CM | POA: Diagnosis not present

## 2012-10-02 DIAGNOSIS — I4891 Unspecified atrial fibrillation: Secondary | ICD-10-CM

## 2012-10-02 DIAGNOSIS — I509 Heart failure, unspecified: Secondary | ICD-10-CM | POA: Diagnosis not present

## 2012-10-02 DIAGNOSIS — R079 Chest pain, unspecified: Secondary | ICD-10-CM | POA: Diagnosis not present

## 2012-10-02 LAB — BASIC METABOLIC PANEL
BUN: 17 mg/dL (ref 6–23)
Creatinine, Ser: 1.49 mg/dL — ABNORMAL HIGH (ref 0.50–1.10)
GFR calc Af Amer: 42 mL/min — ABNORMAL LOW (ref >90)
GFR calc non Af Amer: 36 mL/min — ABNORMAL LOW (ref >90)

## 2012-10-02 LAB — CBC
MCHC: 28.8 g/dL — ABNORMAL LOW (ref 30.0–36.0)
RDW: 20.9 % — ABNORMAL HIGH (ref 11.5–15.5)

## 2012-10-02 LAB — LIPID PANEL
HDL: 44 mg/dL (ref >39)
LDL Cholesterol: 128 mg/dL — ABNORMAL HIGH (ref 0–99)
Total CHOL/HDL Ratio: 4.4 RATIO

## 2012-10-02 LAB — TROPONIN I: Troponin I: <0.3 ng/mL (ref <0.30)

## 2012-10-02 MED ORDER — HYDROCODONE-ACETAMINOPHEN 5-325 MG PO TABS
1.0000 | ORAL_TABLET | Freq: Four times a day (QID) | ORAL | Status: DC | PRN
  Administered 2012-10-02 (×2): 2 via ORAL
  Administered 2012-10-03 (×2): 1 via ORAL
  Filled 2012-10-02: qty 1
  Filled 2012-10-02: qty 2
  Filled 2012-10-02: qty 1
  Filled 2012-10-02: qty 2

## 2012-10-02 MED ORDER — METOPROLOL TARTRATE 12.5 MG HALF TABLET
12.5000 mg | ORAL_TABLET | Freq: Every day | ORAL | Status: DC
  Administered 2012-10-02 – 2012-10-03 (×2): 12.5 mg via ORAL
  Filled 2012-10-02: qty 1

## 2012-10-02 MED ORDER — SIMVASTATIN 5 MG PO TABS
5.0000 mg | ORAL_TABLET | Freq: Every day | ORAL | Status: DC
  Administered 2012-10-02: 5 mg via ORAL
  Filled 2012-10-02 (×2): qty 1

## 2012-10-02 NOTE — Progress Notes (Signed)
Utilization Review Completed.   Kerensa Nicklas, RN, BSN Nurse Case Manager  [phone]  

## 2012-10-02 NOTE — Progress Notes (Signed)
[facility] PROGRESS NOTE  [name] NGE:[id number] DOB: Apr 05, 1951 DOA: 10/01/2012 PCP: Georganna Skeans, MD  Assessment/Plan:  Chest pain with acute on chronic diastolic CHF, troponins negative x 3.  Last ECHO 03/03/12, mild LVH, mod TR.  No signs of breast mass or lymphadenopathy on left side -  Continue diuresis -  Daily weights and strict I/O -  Daily electrolytes -  Continue telemetry  -  Increase vicodin per patient request.  Atrial fibrillation with hx of RVR, now with sinus bradycardia and intermittent pauses, but stable BP.  HR increases by about 20 bpm with exertion and she denied lightheadedness.  Spoke with Dr. Jacinto Halim, her cardiologist who recommended continuing her amiodarone and giving her metoprolol in the morning only.   -  Change metoprolol to 12.5mg  po daily -  Continue amiodarone -  F/u with cardiology on Nov 21st. -  Continue aspirin (was not felt to be a good candidate for warfarin due to recurrent brisk GIB)  Hyperlipidemia, LDL 123 -  Consider statin  HTN, blood pressure stable to elevated in setting of acute illness -  Continue losartan  Microcytic anemia, iron deficiency -  Continue oral iron -  Trend hemoglobin  Borderline diabetes, CBGs stable.  Monitor Chronic kidney disease, stage III, unclear baseline but possibly 1.2, creatinine rising GERD, stable, continue PPI Mood disorder, stable.  Continue celexa  Tobacco abuse, counseled cessation, continue nicotine patch  DIET:  Healthy heart ACCESS:  PIV IVF:  None PROPH:  Lovenox  Code Status: Full code Family Communication: Spoke with patient at bedside Disposition Plan: Pending further diuresis and evaluation of bradycardia.  Likely one day if HR increases with ambulation  History of Present Illness:  [name] is an 61 y.o. female with a PMH of atrial fibrillation, HTN, hyperlipidemia and borderline DM who presents with a 3-4 day history of chest pain, left sided, that came  on suddenly. The patient does not recall what she was doing when the pain 1st came on. Pain has been intermittent, rated a 10/10 at its worst, and radiates across chest to the left side. Had some mild nausea when pain first came on, but none since. Occasional diaphoresis. Also reports myalgias/arthralgias.   Consultants:  None  Procedures:  None  Antibiotics:  None  HPI/Subjective:  States she continues to have chest pain along the lateral left breast.  Denies SOB, N/V/D.  Has lower extremity edema  Objective: Filed Vitals:   10/01/12 2125 10/01/12 2223 10/02/12 0500 10/02/12 0507  BP:  141/88  155/77  Pulse: 97 84  56  Temp:  98.5 F (36.9 C)  98.8 F (37.1 C)  TempSrc:  Oral  Oral  Resp:  18  18  Height:      Weight:   84.7 kg (186 lb 11.7 oz)   SpO2:  92%  92%    Intake/Output Summary (Last 24 hours) at 10/02/12 1147 Last data filed at 10/02/12 0800  Gross per 24 hour  Intake    240 ml  Output   1200 ml  Net   -960 ml   Filed Weights   10/01/12 1731 10/02/12 0500  Weight: 88.451 kg (195 lb) 84.7 kg (186 lb 11.7 oz)    Exam:   General:  AAF, no acute distress  HEENT:  MMM  Cardiovascular: Bradycardic, regular rhythm, no murmurs, rubs, or gallops  Chest:  Left breast without tenderness or mass, no axillary lymphadenopathy  Respiratory: CTAB  Abdomen: NABS, soft, nondistended, nontender  MSK:  Trace LEE  Data Reviewed: Basic Metabolic Panel:  Lab 10/01/12 1610 10/01/12 1300  NA -- 135  K -- 4.2  CL -- 100  CO2 -- 23  GLUCOSE -- 117*  BUN -- 10  CREATININE 1.26* 1.23*  CALCIUM -- 9.0  MG -- --  PHOS -- --   Liver Function Tests:  Lab 10/01/12 1839  AST 23  ALT 44*  ALKPHOS 125*  BILITOT 0.2*  PROT 7.8  ALBUMIN 3.1*   No results found for this basename: LIPASE:5,AMYLASE:5 in the last 168 hours No results found for this basename: AMMONIA:5 in the last 168 hours CBC:  Lab 10/01/12 1839 10/01/12 1300  WBC 8.7 10.0  NEUTROABS --  --  HGB 9.4* 8.9*  HCT 31.9* 30.6*  MCV 65.2* 65.0*  PLT 314 455*   Cardiac Enzymes:  Lab 10/02/12 0526 10/01/12 2325 10/01/12 1838  CKTOTAL -- -- --  CKMB -- -- --  CKMBINDEX -- -- --  TROPONINI <0.30 <0.30 <0.30   BNP (last 3 results)  Basename 10/01/12 1300 05/24/12 1634 03/04/12 0510  PROBNP 1975.0* 2614.0* 2374.0*   CBG: No results found for this basename: GLUCAP:5 in the last 168 hours  Recent Results (from the past 240 hour(s))  MRSA PCR SCREENING     Status: Normal   Collection Time   10/01/12  5:16 PM      Component Value Range Status Comment   MRSA by PCR NEGATIVE  NEGATIVE Final      Studies: Dg Chest Port 1 View  10/01/2012  *RADIOLOGY REPORT*  Clinical Data: Chest pain, hypertension  PORTABLE CHEST - 1 VIEW  Comparison: Portable exam 1235 hours compared to 05/24/2012  Findings: Enlargement of cardiac silhouette with pulmonary vascular congestion. Mild accentuation of interstitial markings in the perihilar regions extending to the bases, greater on the right, likely mild pulmonary edema. No segmental consolidation, pleural effusion or pneumothorax. Bones demineralized.  IMPRESSION: Enlargement of cardiac silhouette with pulmonary vascular congestion and probable mild pulmonary edema.   Original Report Authenticated By: Ulyses Southward, M.D.     Scheduled Meds:   . sodium chloride   Intravenous Once  . amiodarone  200 mg Oral Daily  . aspirin  325 mg Oral Daily  . calcium-vitamin D  1 tablet Oral Daily  . citalopram  20 mg Oral q morning - 10a  . docusate sodium  100 mg Oral Daily  . enoxaparin (LOVENOX) injection  40 mg Subcutaneous Q24H  . ferrous sulfate  325 mg Oral BID WC  . [COMPLETED] furosemide  20 mg Intravenous Once  . furosemide  40 mg Intravenous Q12H  . loratadine  10 mg Oral Daily  . losartan  25 mg Oral Daily  . metoCLOPramide  5 mg Oral TID AC & HS  . metoprolol tartrate  12.5 mg Oral BID  . [COMPLETED]  morphine injection  4 mg Intravenous Once   . nicotine  14 mg Transdermal Daily  . pantoprazole  40 mg Oral Daily  . potassium chloride SA  20 mEq Oral BID  . sodium chloride  3 mL Intravenous Q12H  . sodium chloride  3 mL Intravenous Q12H  . [DISCONTINUED] Calcium + D3  1 tablet Per post-pyloric tube Daily   Continuous Infusions:   Principal Problem:  *Acute on chronic diastolic CHF (congestive heart failure) Active Problems:  HYPERLIPIDEMIA  TOBACCO USER  HYPERTENSION  CANNABIS ABUSE, HX OF  Atrial fibrillation  Microcytic anemia    Time spent: 30  Renae Fickle  [facility] Pager [phone]. If 8PM-8AM, please contact night-coverage at [email or web address], password [facility] 10/02/2012, 11:47 AM  LOS: 1 day

## 2012-10-03 DIAGNOSIS — I4891 Unspecified atrial fibrillation: Secondary | ICD-10-CM | POA: Diagnosis not present

## 2012-10-03 DIAGNOSIS — I509 Heart failure, unspecified: Secondary | ICD-10-CM | POA: Diagnosis not present

## 2012-10-03 DIAGNOSIS — R079 Chest pain, unspecified: Secondary | ICD-10-CM | POA: Diagnosis not present

## 2012-10-03 DIAGNOSIS — I5033 Acute on chronic diastolic (congestive) heart failure: Secondary | ICD-10-CM | POA: Diagnosis not present

## 2012-10-03 LAB — BASIC METABOLIC PANEL
GFR calc Af Amer: 40 mL/min — ABNORMAL LOW (ref >90)
GFR calc non Af Amer: 35 mL/min — ABNORMAL LOW (ref >90)
Potassium: 3.7 mEq/L (ref 3.5–5.1)
Sodium: 136 mEq/L (ref 135–145)

## 2012-10-03 LAB — CBC
Hemoglobin: 10 g/dL — ABNORMAL LOW (ref 12.0–15.0)
MCHC: 29.2 g/dL — ABNORMAL LOW (ref 30.0–36.0)
RBC: 5.29 MIL/uL — ABNORMAL HIGH (ref 3.87–5.11)

## 2012-10-03 LAB — HEMOGLOBIN A1C
Hgb A1c MFr Bld: 5.6 % (ref <5.7)
Mean Plasma Glucose: 114 mg/dL (ref <117)

## 2012-10-03 MED ORDER — CITALOPRAM HYDROBROMIDE 40 MG PO TABS: 40.0000 mg | ORAL_TABLET | Freq: Every morning | ORAL | Status: DC

## 2012-10-03 MED ORDER — NICOTINE 14 MG/24HR TD PT24: 1.0000 | MEDICATED_PATCH | Freq: Every day | TRANSDERMAL | Status: DC

## 2012-10-03 MED ORDER — PRAVASTATIN SODIUM 20 MG PO TABS: 20.0000 mg | ORAL_TABLET | Freq: Every day | ORAL | Status: DC

## 2012-10-03 MED ORDER — HYDROCODONE-ACETAMINOPHEN 5-325 MG PO TABS: 1.0000 | ORAL_TABLET | Freq: Four times a day (QID) | ORAL | Status: DC | PRN

## 2012-10-03 NOTE — Discharge Summary (Signed)
Physician Discharge Summary  [name] ZOX:[id number] DOB: 1951-03-10 DOA: 10/01/2012  PCP: Georganna Skeans, MD  Admit date: 10/01/2012 Discharge date: 10/03/2012  Recommendations for Outpatient Follow-up:  1. Follow up with Dr. Jacinto Halim at already scheduled appointment.   2. Patient will be called by Dr. Verl Dicker office to schedule an event monitor. 3. She should follow up with her PCP so that results of hemoglobin A1c can be reviewed (pending at time of discharge).  She should also discuss her level of anxiety.  Considered increasing her celexa, however, concurrent use of reglan and celexa increases the risk of serotonin syndrome per the pharmacist.  Please make sure mammogram is up to date.  Consider repeating BMP at follow up appointment.    Discharge Diagnoses:  Principal Problem:  *Acute on chronic diastolic CHF (congestive heart failure) Active Problems:  HYPERLIPIDEMIA  TOBACCO USER  HYPERTENSION  CANNABIS ABUSE, HX OF  Atrial fibrillation  Microcytic anemia  CKD (chronic kidney disease) stage 3, GFR 30-59 ml/min   Discharge Condition: stable, improved  Diet recommendation: healthy heart  Wt Readings from Last 3 Encounters:  10/03/12 85.957 kg (189 lb 8 oz)  05/26/12 86.2 kg (190 lb 0.6 oz)  03/09/12 83.416 kg (183 lb 14.4 oz)    History of Present Illness:  [name] is an 61 y.o. female with a PMH of atrial fibrillation, HTN, hyperlipidemia and borderline DM who presents with a 3-4 day history of chest pain, left sided, that came on suddenly. The patient does not recall what she was doing when the pain 1st came on. Pain has been intermittent, rated a 10/10 at its worst, and radiates across chest to the left side. Had some mild nausea when pain first came on, but none since. Occasional diaphoresis. Also reports myalgias/arthralgias.    Hospital Course:   [name] was admitted with chest pain which was likely due to acute on chronic diastolic CHF.  Her  troponins negative x 3.   She has history of falsely elevated D-dimer with negative CTa chest so test was deferred initially.  Her symptoms improved with diuresis so further evaluation for PE was not pursued.  Her last ECHO was 03/03/12 which demonstrated mild LVH, mod TR. She had no signs of breast mass or lymphadenopathy on left side.  She was diuresed with lasix 40mg  IV q12h with approximately 1.2L diuresed until her BUN and creatinine started to rise.  Her pain improved with diuresis but pain may be related to her chest wall also given the lateral nature.  She denied shortness of breath.  She may try tylenol or at home for her pain, but avoid ibuprofen and naprosyn because of her chronic kidney disease.  She has an appointment with cardiology and will get a referral for mammogram from her PCP.      Atrial fibrillation with hx of RVR.  TSH was wnl.  She alternated between atrial fibrillation with a rate around 100bmp and sinus bradycardia to the 40s and 50s.  On 11/9, she had two pauses on telemetry which were about 2 seconds long each with stable BP.  Her HR increased by about 20 bpm with exertion and she denied lightheadedness. Spoke with Dr. Jacinto Halim, her cardiologist who recommended continuing her amiodarone and giving her metoprolol in the morning only.  She did not receive her dose of metoprolol on the evening of 11/9, however, she quickly reverted to atrial fibrillation with rate in the 100s overnight.  After her morning metoprolol on 11/10, she  briefly resumed sinus bradycardia and had a 3 second pause, but then returned to atrial fibrillation with rate in the 110s.  Spoke with Dr. Jacinto Halim who recommended resuming BID metoprolol.  His office will call her to schedule an event monitor.  She should continue her daily aspirin for stroke prevention.  She is not a candidate for warfarin secondary to history of brisk GI bleeds.  Follow up with Dr. Jacinto Halim on Nov 21st.   Hyperlipidemia, LDL 123.  She was started on  pravastatin. Lab Results  Component Value Date   CHOL 195 10/02/2012   HDL 44 10/02/2012   LDLCALC 128* 10/02/2012   TRIG 115 10/02/2012   CHOLHDL 4.4 10/02/2012    HTN, blood pressure stable to elevated in setting of acute illness.  She continued losartan.    Microcytic anemia, iron deficiency.  Continued oral iron  Borderline diabetes, CBGs were elevated up to 150s.  She should follow up with her primary care doctor for follow up of a pending A1c.   Chronic kidney disease, stage III, unclear baseline but possibly 1.2, creatinine rising GERD, stable, continue PPI.    Mood disorder, patient endorsed anxiety. She should talk to her doctor about titrating celexa, however, she may need to have her reglan stopped or reduced secondary to increased risk for serotonin syndrome with coadministration of these medications.    Tobacco abuse, counseled cessation, and recommended nicotine patch  Procedures:  None  Consultations:  None  Discharge Exam: Filed Vitals:   10/03/12 0947  BP: 106/75  Pulse: 115  Temp:   Resp:    Filed Vitals:   10/02/12 1420 10/02/12 2058 10/03/12 0618 10/03/12 0947  BP: 117/54 118/61 124/63 106/75  Pulse: 60 50 52 115  Temp: 98.4 F (36.9 C) 98.6 F (37 C) 97.8 F (36.6 C)   TempSrc: Oral Oral Oral   Resp: 16 18 18    Height:      Weight:   85.957 kg (189 lb 8 oz)   SpO2: 99% 94% 94%    General: AAF, no acute distress  HEENT: MMM  Cardiovascular: Tachycardic, irregular, no murmurs, rubs, or gallops  Chest: Left breast without tenderness or mass, no axillary lymphadenopathy  Respiratory: CTAB  Abdomen: NABS, soft, nondistended, nontender  MSK: No LEE  Discharge Instructions      Discharge Orders    Future Orders Please Complete By Expires   Diet - low sodium heart healthy      Increase activity slowly      Discharge instructions      Comments:   You were hospitalized with chest pain and diastolic heart failure exacerbation.  You were given  lasix.  You did not have a heart attack.  You may have some chest wall or atypical chest pain.  Please make sure your mammogram is up to date.   You had some fast and slow heart rates.  Dr. Jacinto Halim, your cardiologist recommended holding your evening dose of metoprolol, but your heart rate trended up, so he would like you to restart your previous doses of medication.  His office will call you to set up an event monitor so he can track your heart rate for a longer period of time.  Per the pharmacist, there is a drug-drug interaction with your reglan and your celexa and the dose of celexa should not be increased without close monitoring or changing your reglan.  Will defer this decision to your primary care doctor.  Your primary care doctor should follow  up on your hemoglobin A1c (test for diabetes) also.  Finally, because your cholesterol was a little high, you were started on a low dose of a cholesterol medication called pravastatin.  Please talk to your primary care doctor about this medication.   Call MD for:  temperature >100.4      Call MD for:  persistant nausea and vomiting      Call MD for:  severe uncontrolled pain      Call MD for:  difficulty breathing, headache or visual disturbances      Call MD for:  hives      Call MD for:  persistant dizziness or light-headedness      Call MD for:  extreme fatigue      (HEART FAILURE PATIENTS) Call MD:  Anytime you have any of the following symptoms: 1) 3 pound weight gain in 24 hours or 5 pounds in 1 week 2) shortness of breath, with or without a dry hacking cough 3) swelling in the hands, feet or stomach 4) if you have to sleep on extra pillows at night in order to breathe.      Call MD for:      Comments:   Call 911 if you have chest pain with shortness of breath, slurred speech, confusion, facial droop, numbness or weakness of an arm or leg.       Medication List     As of 10/03/2012 12:20 PM    STOP taking these medications         traMADol 50  MG tablet   Commonly known as: ULTRAM      TAKE these medications         amiodarone 200 MG tablet   Commonly known as: PACERONE   Take 200 mg by mouth daily.      aspirin 325 MG EC tablet   Take 325 mg by mouth daily.      CALCIUM + D3 PO   Take 1 tablet by mouth daily.      citalopram 20 MG tablet   Commonly known as: CELEXA   Take 20 mg by mouth every morning.      docusate sodium 100 MG capsule   Commonly known as: COLACE   Take 100 mg by mouth daily.      furosemide 40 MG tablet   Commonly known as: LASIX   Take 40 mg by mouth daily.      HYDROcodone-acetaminophen 5-325 MG per tablet   Commonly known as: NORCO/VICODIN   Take 1-2 tablets by mouth every 6 (six) hours as needed.      IRON COMPLEX PO   Take 50 mg by mouth daily.      loratadine 10 MG tablet   Commonly known as: CLARITIN   Take 10 mg by mouth daily.      losartan 25 MG tablet   Commonly known as: COZAAR   Take 25 mg by mouth daily.      metoCLOPramide 5 MG tablet   Commonly known as: REGLAN   Take 5 mg by mouth 4 (four) times daily.      metoprolol tartrate 25 MG tablet   Commonly known as: LOPRESSOR   Take 12.5 mg by mouth 2 (two) times daily.      nicotine 14 mg/24hr patch   Commonly known as: NICODERM CQ - dosed in mg/24 hours   Place 1 patch onto the skin daily.      pantoprazole 40 MG tablet   Commonly  known as: PROTONIX   Take 40 mg by mouth daily.      potassium chloride SA 20 MEQ tablet   Commonly known as: K-DUR,KLOR-CON   Take 1 tablet (20 mEq total) by mouth 2 (two) times daily.      pravastatin 20 MG tablet   Commonly known as: PRAVACHOL   Take 1 tablet (20 mg total) by mouth at bedtime.      temazepam 15 MG capsule   Commonly known as: RESTORIL   Take 15 mg by mouth at bedtime as needed. For sleep.        Follow-up Information    Follow up with [facility], MD. In 1 week. (follow up on hemoglobin A1c)    Contact information:   [address]  [place] [address] [phone]       Follow up with Pamella Pert, MD. On 10/14/2012.   Contact information:   [address] [place] [address] [phone]           The results of significant diagnostics from this hospitalization (including imaging, microbiology, ancillary and laboratory) are listed below for reference.    Significant Diagnostic Studies: Dg Chest Port 1 View  10/01/2012  *RADIOLOGY REPORT*  Clinical Data: Chest pain, hypertension  PORTABLE CHEST - 1 VIEW  Comparison: Portable exam 1235 hours compared to 05/24/2012  Findings: Enlargement of cardiac silhouette with pulmonary vascular congestion. Mild accentuation of interstitial markings in the perihilar regions extending to the bases, greater on the right, likely mild pulmonary edema. No segmental consolidation, pleural effusion or pneumothorax. Bones demineralized.  IMPRESSION: Enlargement of cardiac silhouette with pulmonary vascular congestion and probable mild pulmonary edema.   Original Report Authenticated By: Ulyses Southward, M.D.     Microbiology: Recent Results (from the past 240 hour(s))  MRSA PCR SCREENING     Status: Normal   Collection Time   10/01/12  5:16 PM      Component Value Range Status Comment   MRSA by PCR NEGATIVE  NEGATIVE Final      Labs: Basic Metabolic Panel:  Lab 10/03/12 1027 10/02/12 1220 10/01/12 1839 10/01/12 1300  NA 136 135 -- 135  K 3.7 4.1 -- 4.2  CL 99 98 -- 100  CO2 25 27 -- 23  GLUCOSE 82 86 -- 117*  BUN 22 17 -- 10  CREATININE 1.55* 1.49* 1.26* 1.23*  CALCIUM 9.6 9.1 -- 9.0  MG -- -- -- --  PHOS -- -- -- --   Liver Function Tests:  Lab 10/01/12 1839  AST 23  ALT 44*  ALKPHOS 125*  BILITOT 0.2*  PROT 7.8  ALBUMIN 3.1*   No results found for this basename: LIPASE:5,AMYLASE:5 in the last 168 hours No results found for this basename: AMMONIA:5 in the last 168 hours CBC:  Lab 10/03/12 0518 10/02/12 1220 10/01/12 1839 10/01/12 1300  WBC 10.6* 8.9 8.7  10.0  NEUTROABS -- -- -- --  HGB 10.0* 9.0* 9.4* 8.9*  HCT 34.3* 31.2* 31.9* 30.6*  MCV 64.8* 66.1* 65.2* 65.0*  PLT 499* 446* 314 455*   Cardiac Enzymes:  Lab 10/02/12 0526 10/01/12 2325 10/01/12 1838  CKTOTAL -- -- --  CKMB -- -- --  CKMBINDEX -- -- --  TROPONINI <0.30 <0.30 <0.30   BNP: BNP (last 3 results)  Basename 10/01/12 1300 05/24/12 1634 03/04/12 0510  PROBNP 1975.0* 2614.0* 2374.0*   CBG: No results found for this basename: GLUCAP:5 in the last 168 hours  Time coordinating discharge:  45 minutes  Signed:  Trasean Delima  [facility] 10/03/2012, 12:20 PM

## 2012-10-03 NOTE — Progress Notes (Signed)
Patient discharged home, discharge instructions given and explained to patient and she verbalized understanding. Denies any distress. Skin intact no wound. Patient accompanied home by family. Reinforced CHF management at home and patient verbalized understanding.

## 2012-10-03 NOTE — Progress Notes (Signed)
Patient had 3.17 sec. Pause, asymptomatic, denies any pain/distress; Vitals 104/69, 101, 93%-RA 18. Patient lying in bed. Dr. Malachi Bonds notified no new order given, will continue to assess patient.

## 2012-10-03 NOTE — Progress Notes (Signed)
Pt noted to have converted to a-fib, Rate 90-100's. Pt asleep, asymptomatic upon wakening. VSS. Elray Mcgregor, NP made aware. No new orders received. Will monitor.

## 2012-10-06 NOTE — ED Provider Notes (Signed)
Medical screening examination/treatment/procedure(s) were performed by non-physician practitioner and as supervising physician I was immediately available for consultation/collaboration.  Flint Melter, MD 10/06/12 [phone]

## 2012-12-18 ENCOUNTER — Emergency Department (HOSPITAL_COMMUNITY)
Admission: EM | Admit: 2012-12-18 | Discharge: 2012-12-18 | Disposition: A | Discharge status: Home / Self Care | Payer: Medicare Other | Attending: Emergency Medicine | Admitting: Emergency Medicine

## 2012-12-18 ENCOUNTER — Encounter (HOSPITAL_COMMUNITY): Payer: Self-pay | Admitting: Emergency Medicine

## 2012-12-18 ENCOUNTER — Emergency Department (HOSPITAL_COMMUNITY): Payer: Medicare Other

## 2012-12-18 DIAGNOSIS — D649 Anemia, unspecified: Secondary | ICD-10-CM

## 2012-12-18 DIAGNOSIS — R079 Chest pain, unspecified: Secondary | ICD-10-CM

## 2012-12-18 DIAGNOSIS — F17200 Nicotine dependence, unspecified, uncomplicated: Secondary | ICD-10-CM | POA: Insufficient documentation

## 2012-12-18 DIAGNOSIS — Z8639 Personal history of other endocrine, nutritional and metabolic disease: Secondary | ICD-10-CM | POA: Insufficient documentation

## 2012-12-18 DIAGNOSIS — D72829 Elevated white blood cell count, unspecified: Secondary | ICD-10-CM | POA: Insufficient documentation

## 2012-12-18 DIAGNOSIS — N183 Chronic kidney disease, stage 3 unspecified: Secondary | ICD-10-CM | POA: Insufficient documentation

## 2012-12-18 DIAGNOSIS — E785 Hyperlipidemia, unspecified: Secondary | ICD-10-CM | POA: Insufficient documentation

## 2012-12-18 DIAGNOSIS — I129 Hypertensive chronic kidney disease with stage 1 through stage 4 chronic kidney disease, or unspecified chronic kidney disease: Secondary | ICD-10-CM | POA: Insufficient documentation

## 2012-12-18 DIAGNOSIS — Z8679 Personal history of other diseases of the circulatory system: Secondary | ICD-10-CM | POA: Insufficient documentation

## 2012-12-18 DIAGNOSIS — IMO0002 Reserved for concepts with insufficient information to code with codable children: Secondary | ICD-10-CM | POA: Insufficient documentation

## 2012-12-18 DIAGNOSIS — Z862 Personal history of diseases of the blood and blood-forming organs and certain disorders involving the immune mechanism: Secondary | ICD-10-CM | POA: Insufficient documentation

## 2012-12-18 DIAGNOSIS — Z79899 Other long term (current) drug therapy: Secondary | ICD-10-CM | POA: Insufficient documentation

## 2012-12-18 DIAGNOSIS — I509 Heart failure, unspecified: Secondary | ICD-10-CM | POA: Insufficient documentation

## 2012-12-18 LAB — BASIC METABOLIC PANEL
BUN: 14 mg/dL (ref 6–23)
CO2: 21 mEq/L (ref 19–32)
Chloride: 102 mEq/L (ref 96–112)
Creatinine, Ser: 1.08 mg/dL (ref 0.50–1.10)
GFR calc Af Amer: 62 mL/min — ABNORMAL LOW (ref >90)
Glucose, Bld: 92 mg/dL (ref 70–99)

## 2012-12-18 LAB — CBC WITH DIFFERENTIAL/PLATELET
Basophils Absolute: 0.1 10*3/uL (ref 0.0–0.1)
Eosinophils Absolute: 0.1 10*3/uL (ref 0.0–0.7)
HCT: 27.8 % — ABNORMAL LOW (ref 36.0–46.0)
Lymphs Abs: 1.2 10*3/uL (ref 0.7–4.0)
MCH: 19.9 pg — ABNORMAL LOW (ref 26.0–34.0)
MCHC: 29.1 g/dL — ABNORMAL LOW (ref 30.0–36.0)
MCV: 68.1 fL — ABNORMAL LOW (ref 78.0–100.0)
Monocytes Absolute: 0.6 10*3/uL (ref 0.1–1.0)
Neutro Abs: 10 10*3/uL — ABNORMAL HIGH (ref 1.7–7.7)
RDW: 17.9 % — ABNORMAL HIGH (ref 11.5–15.5)

## 2012-12-18 MED ORDER — NAPROXEN 500 MG PO TABS: 500.0000 mg | ORAL_TABLET | Freq: Two times a day (BID) | ORAL | Status: DC

## 2012-12-18 MED ORDER — NITROGLYCERIN 0.4 MG SL SUBL: 0.4000 mg | SUBLINGUAL_TABLET | SUBLINGUAL | Status: DC | PRN

## 2012-12-18 NOTE — ED Notes (Signed)
Pt in from home c/o CP. Pt reports that she saw cardiologist Monday for CP but nothing was done.  Pt describes intermittent CP,10/10. Pt in NAD and A&O

## 2012-12-18 NOTE — ED Notes (Signed)
Patient  Discharged, ambulated without assist. Instructed to follow up with her PCP.

## 2012-12-18 NOTE — ED Provider Notes (Addendum)
History     CSN: [id number]  Arrival date & time 12/18/12  1317   First MD Initiated Contact with Patient 12/18/12 1324      Chief Complaint  Patient presents with  . Chest Pain    (Consider location/radiation/quality/duration/timing/severity/associated sxs/prior treatment) HPI Comments: 62 y/o female with hx of afib, htn, CHF and CKD stage 3 presents with 2 weeks of daily intermittent CP.  She states that it is like someone hitting her in the chest, is usuall in the lateral chest in the axilla and not associated with SOB, orthopnea, PND or coughing and no swelling in the legs - she told her cardiologist about this 5 days ago at her appointment without obvious answers.  She has mild pain at this time - it is not exertional but can come on at any time including supine position, sitting, walking etc.  She doesn't remember her last stress test  Last Echo - 4/13 - showed normal wall motion, normal EF and mild LVH  Patient is a 62 y.o. female presenting with chest pain. The history is provided by the patient and medical records.  Chest Pain     Past Medical History  Diagnosis Date  . Hypertension   . Borderline diabetes   . Irregular heart beat   . Hyperlipidemia   . CHF (congestive heart failure)   . CKD (chronic kidney disease) stage 3, GFR 30-59 ml/min     Past Surgical History  Procedure Date  . Hemorrhoid surgery     Family History  Problem Relation Age of Onset  . Kidney disease Mother   . Heart attack Mother   . Kidney disease Father   . Hypertension Mother   . Breast cancer Daughter   . Heart disease Brother     History  Substance Use Topics  . Smoking status: Current Every Day Smoker -- 0.5 packs/day for 45 years    Types: Cigarettes  . Smokeless tobacco: Current User  . Alcohol Use: No    OB History    Grav Para Term Preterm Abortions TAB SAB Ect Mult Living                  Review of Systems  Cardiovascular: Positive for chest pain.  All other  systems reviewed and are negative.    Allergies  Penicillins  Home Medications   Current Outpatient Rx  Name  Route  Sig  Dispense  Refill  . AMIODARONE HCL 200 MG PO TABS   Oral   Take 200 mg by mouth daily.         . ASPIRIN 325 MG PO TBEC   Oral   Take 325 mg by mouth daily.         Marland Kitchen CALCIUM + D3 PO   Oral   Take 1 tablet by mouth daily.         Marland Kitchen CITALOPRAM HYDROBROMIDE 20 MG PO TABS   Oral   Take 20 mg by mouth at bedtime.          Marland Kitchen DOCUSATE SODIUM 100 MG PO CAPS   Oral   Take 100 mg by mouth daily.          . FUROSEMIDE 40 MG PO TABS   Oral   Take 40 mg by mouth every morning.          . IRON COMPLEX PO   Oral   Take 50 mg by mouth daily.         Marland Kitchen  LOSARTAN POTASSIUM 25 MG PO TABS   Oral   Take 25 mg by mouth daily.         Marland Kitchen METOCLOPRAMIDE HCL 5 MG PO TABS   Oral   Take 5 mg by mouth 4 (four) times daily.         Marland Kitchen METOPROLOL TARTRATE 25 MG PO TABS   Oral   Take 12.5 mg by mouth 2 (two) times daily.         Marland Kitchen PANTOPRAZOLE SODIUM 40 MG PO TBEC   Oral   Take 40 mg by mouth daily.         Marland Kitchen POTASSIUM CHLORIDE CRYS ER 20 MEQ PO TBCR   Oral   Take 1 tablet (20 mEq total) by mouth 2 (two) times daily.   60 tablet   0   . PRAVASTATIN SODIUM 20 MG PO TABS   Oral   Take 1 tablet (20 mg total) by mouth at bedtime.   30 tablet   1   . TEMAZEPAM 15 MG PO CAPS   Oral   Take 15 mg by mouth at bedtime as needed. For sleep.         Marland Kitchen VITAMIN B-12 1000 MCG PO TABS   Oral   Take 1,000 mcg by mouth daily.         Marland Kitchen NAPROXEN 500 MG PO TABS   Oral   Take 1 tablet (500 mg total) by mouth 2 (two) times daily with a meal.   30 tablet   0     BP 144/66  Pulse 49  Temp 98.5 F (36.9 C) (Oral)  Resp 15  SpO2 96%  Physical Exam  Nursing note and vitals reviewed. Constitutional: She appears well-developed and well-nourished. No distress.  HENT:  Head: Normocephalic and atraumatic.  Mouth/Throat: Oropharynx is clear  and moist. No oropharyngeal exudate.  Eyes: Conjunctivae normal and EOM are normal. Pupils are equal, round, and reactive to light. Right eye exhibits no discharge. Left eye exhibits no discharge. No scleral icterus.  Neck: Normal range of motion. Neck supple. No JVD present. No thyromegaly present.  Cardiovascular: Normal rate, regular rhythm, normal heart sounds and intact distal pulses.  Exam reveals no gallop and no friction rub.   No murmur heard. Pulmonary/Chest: Effort normal and breath sounds normal. No respiratory distress. She has no wheezes. She has no rales.  Abdominal: Soft. Bowel sounds are normal. She exhibits no distension and no mass. There is no tenderness.  Musculoskeletal: Normal range of motion. She exhibits no edema and no tenderness.  Lymphadenopathy:    She has no cervical adenopathy.  Neurological: She is alert. Coordination normal.  Skin: Skin is warm and dry. No rash noted. No erythema.  Psychiatric: She has a normal mood and affect. Her behavior is normal.    ED Course  Procedures (including critical care time)  Labs Reviewed  BASIC METABOLIC PANEL - Abnormal; Notable for the following:    GFR calc non Af Amer 54 (*)     GFR calc Af Amer 62 (*)     All other components within normal limits  CBC WITH DIFFERENTIAL - Abnormal; Notable for the following:    WBC 12.0 (*)     Hemoglobin 8.1 (*)     HCT 27.8 (*)     MCV 68.1 (*)     MCH 19.9 (*)     MCHC 29.1 (*)     RDW 17.9 (*)     Platelets 485 (*)  Neutrophils Relative 83 (*)     Lymphocytes Relative 10 (*)     Neutro Abs 10.0 (*)     All other components within normal limits  APTT  PROTIME-INR  TROPONIN I  PRO B NATRIURETIC PEPTIDE   Dg Chest Port 1 View  12/18/2012  *RADIOLOGY REPORT*  Clinical Data: Chest pain  PORTABLE CHEST - 1 VIEW  Comparison: 10/01/2012  Findings: Cardiomegaly again noted. Stable central vascular congestion.  No convincing pulmonary edema. Bilateral basilar atelectasis or  scarring.  IMPRESSION: Central vascular congestion without convincing pulmonary edema. Bilateral basilar atelectasis or scarring.   Original Report Authenticated By: Natasha Mead, M.D.      1. Chest pain   2. Anemia   3. Leukocytosis       MDM  The patient appears well, she has mild ongoing pain, this is somewhat reproducible on palpation of the chest. Review of the medical record shows that she has been admitted several times in the past, has recently for similar type chest pains in the same location, she has had a stress test in 2011 per Dr. Jacinto Halim.  I have discussed her care with him a few minutes ago and he states that he will have the office call her to schedule a followup stress test this week. The patient has expressed her understanding, I have told her about her results including anemia. She will followup this week, she does not appear to have any tachycardia, hypoxia, hypertension and her other laboratory workup has been overall fairly normal. She has a slight leukocytosis which is nonspecific.  ED ECG REPORT  I personally interpreted this EKG   Date: 12/18/2012   Rate: 51  Rhythm: sinus bradycardia  QRS Axis: normal  Intervals: normal  ST/T Wave abnormalities: normal  Conduction Disutrbances:none  Narrative Interpretation:   Old EKG Reviewed: Compared with 10/01/2012, no significant changes   PA and lateral views of the chest were obtained by digital radiography. I have personally interpreted these x-rays and find her to be no signs of pulmonary infiltrate, cardiomegaly, subdiaphragmatic free air, soft tissue abnormality, no obvious bony abnormalities or fractures.  Patient stable for discharge at this time.            Vida Roller, MD 12/18/12 1601  Vida Roller, MD 12/30/12 1116

## 2013-02-01 ENCOUNTER — Telehealth: Payer: Self-pay | Admitting: Oncology

## 2013-02-01 NOTE — Telephone Encounter (Signed)
LVOM for pt to return call.  °

## 2013-02-02 ENCOUNTER — Telehealth: Payer: Self-pay | Admitting: Oncology

## 2013-02-02 NOTE — Telephone Encounter (Signed)
S/W pt in re NP appt 04/01 @ 10:30 w/Dr. Clelia Croft.  Referring Dr. [name] Dx- IDA w/normal endoscopic  Welcome packet mailed.

## 2013-02-03 ENCOUNTER — Telehealth: Payer: Self-pay | Admitting: Oncology

## 2013-02-03 NOTE — Telephone Encounter (Signed)
C/D 02/03/13 for appt. 02/22/13

## 2013-02-16 ENCOUNTER — Telehealth: Payer: Self-pay | Admitting: Oncology

## 2013-02-16 NOTE — Telephone Encounter (Signed)
Called pt to give her new np appt, on 03-16-13@10 :30. pts son answered the phone I asked him to have his mother call Dr. Alver Fisher office.  Mailed the pts calendar with np appt.

## 2013-02-22 ENCOUNTER — Ambulatory Visit: Payer: Medicare Other

## 2013-02-22 ENCOUNTER — Other Ambulatory Visit: Payer: Medicare Other | Admitting: Lab

## 2013-02-22 ENCOUNTER — Ambulatory Visit: Payer: Medicare Other | Admitting: Oncology

## 2013-02-24 ENCOUNTER — Encounter (HOSPITAL_COMMUNITY): Payer: Self-pay | Admitting: Emergency Medicine

## 2013-02-24 ENCOUNTER — Inpatient Hospital Stay (HOSPITAL_COMMUNITY)
Admission: EM | Admit: 2013-02-24 | Discharge: 2013-02-28 | DRG: 377 | Disposition: A | Discharge status: Home / Self Care | Payer: Medicare Other | Attending: Internal Medicine | Admitting: Internal Medicine

## 2013-02-24 ENCOUNTER — Emergency Department (HOSPITAL_COMMUNITY): Payer: Medicare Other

## 2013-02-24 DIAGNOSIS — K922 Gastrointestinal hemorrhage, unspecified: Secondary | ICD-10-CM | POA: Diagnosis not present

## 2013-02-24 DIAGNOSIS — R599 Enlarged lymph nodes, unspecified: Secondary | ICD-10-CM | POA: Diagnosis present

## 2013-02-24 DIAGNOSIS — K5521 Angiodysplasia of colon with hemorrhage: Principal | ICD-10-CM | POA: Diagnosis present

## 2013-02-24 DIAGNOSIS — Z79899 Other long term (current) drug therapy: Secondary | ICD-10-CM

## 2013-02-24 DIAGNOSIS — F209 Schizophrenia, unspecified: Secondary | ICD-10-CM | POA: Diagnosis present

## 2013-02-24 DIAGNOSIS — I5033 Acute on chronic diastolic (congestive) heart failure: Secondary | ICD-10-CM | POA: Diagnosis present

## 2013-02-24 DIAGNOSIS — I10 Essential (primary) hypertension: Secondary | ICD-10-CM

## 2013-02-24 DIAGNOSIS — R7309 Other abnormal glucose: Secondary | ICD-10-CM | POA: Diagnosis present

## 2013-02-24 DIAGNOSIS — D509 Iron deficiency anemia, unspecified: Secondary | ICD-10-CM | POA: Diagnosis present

## 2013-02-24 DIAGNOSIS — I129 Hypertensive chronic kidney disease with stage 1 through stage 4 chronic kidney disease, or unspecified chronic kidney disease: Secondary | ICD-10-CM | POA: Diagnosis present

## 2013-02-24 DIAGNOSIS — D649 Anemia, unspecified: Secondary | ICD-10-CM | POA: Diagnosis not present

## 2013-02-24 DIAGNOSIS — I951 Orthostatic hypotension: Secondary | ICD-10-CM | POA: Diagnosis present

## 2013-02-24 DIAGNOSIS — F17200 Nicotine dependence, unspecified, uncomplicated: Secondary | ICD-10-CM | POA: Diagnosis present

## 2013-02-24 DIAGNOSIS — Z880 Allergy status to penicillin: Secondary | ICD-10-CM

## 2013-02-24 DIAGNOSIS — I4891 Unspecified atrial fibrillation: Secondary | ICD-10-CM | POA: Diagnosis not present

## 2013-02-24 DIAGNOSIS — N183 Chronic kidney disease, stage 3 unspecified: Secondary | ICD-10-CM | POA: Diagnosis present

## 2013-02-24 DIAGNOSIS — R195 Other fecal abnormalities: Secondary | ICD-10-CM

## 2013-02-24 DIAGNOSIS — I498 Other specified cardiac arrhythmias: Secondary | ICD-10-CM | POA: Diagnosis present

## 2013-02-24 DIAGNOSIS — I509 Heart failure, unspecified: Secondary | ICD-10-CM | POA: Diagnosis present

## 2013-02-24 DIAGNOSIS — E785 Hyperlipidemia, unspecified: Secondary | ICD-10-CM | POA: Diagnosis present

## 2013-02-24 HISTORY — DX: Unspecified osteoarthritis, unspecified site: M19.90

## 2013-02-24 HISTORY — DX: Shortness of breath: R06.02

## 2013-02-24 HISTORY — DX: Anemia, unspecified: D64.9

## 2013-02-24 HISTORY — DX: Major depressive disorder, single episode, unspecified: F32.9

## 2013-02-24 HISTORY — DX: Gastro-esophageal reflux disease without esophagitis: K21.9

## 2013-02-24 HISTORY — DX: Anxiety disorder, unspecified: F41.9

## 2013-02-24 HISTORY — DX: Headache: R51

## 2013-02-24 HISTORY — DX: Depression, unspecified: F32.A

## 2013-02-24 LAB — URINALYSIS, ROUTINE W REFLEX MICROSCOPIC
Bilirubin Urine: NEGATIVE
Glucose, UA: NEGATIVE mg/dL
Hgb urine dipstick: NEGATIVE
Ketones, ur: NEGATIVE mg/dL
Leukocytes, UA: NEGATIVE
Nitrite: NEGATIVE
Protein, ur: NEGATIVE mg/dL
Specific Gravity, Urine: 1.013 (ref 1.005–1.030)
Urobilinogen, UA: 1 mg/dL (ref 0.0–1.0)
pH: 5.5 (ref 5.0–8.0)

## 2013-02-24 LAB — CBC WITH DIFFERENTIAL/PLATELET
Basophils Absolute: 0.1 10*3/uL (ref 0.0–0.1)
Basophils Relative: 1 % (ref 0–1)
Eosinophils Absolute: 0.2 10*3/uL (ref 0.0–0.7)
Eosinophils Relative: 2 % (ref 0–5)
HCT: 21.1 % — ABNORMAL LOW (ref 36.0–46.0)
Hemoglobin: 6.1 g/dL — CL (ref 12.0–15.0)
Lymphocytes Relative: 18 % (ref 12–46)
Lymphs Abs: 1.7 K/uL (ref 0.7–4.0)
MCH: 18 pg — ABNORMAL LOW (ref 26.0–34.0)
MCHC: 28.9 g/dL — ABNORMAL LOW (ref 30.0–36.0)
MCV: 62.4 fL — ABNORMAL LOW (ref 78.0–100.0)
Monocytes Absolute: 0.5 K/uL (ref 0.1–1.0)
Monocytes Relative: 5 % (ref 3–12)
Neutro Abs: 6.7 10*3/uL (ref 1.7–7.7)
Neutrophils Relative %: 74 % (ref 43–77)
Platelets: 441 10*3/uL — ABNORMAL HIGH (ref 150–400)
RBC: 3.38 MIL/uL — ABNORMAL LOW (ref 3.87–5.11)
RDW: 18.6 % — ABNORMAL HIGH (ref 11.5–15.5)
WBC: 9.2 10*3/uL (ref 4.0–10.5)

## 2013-02-24 LAB — COMPREHENSIVE METABOLIC PANEL WITH GFR
ALT: 13 U/L (ref 0–35)
CO2: 23 meq/L (ref 19–32)
Calcium: 8.7 mg/dL (ref 8.4–10.5)
Chloride: 104 meq/L (ref 96–112)
Creatinine, Ser: 1.2 mg/dL — ABNORMAL HIGH (ref 0.50–1.10)
GFR calc Af Amer: 55 mL/min — ABNORMAL LOW (ref >90)
GFR calc non Af Amer: 47 mL/min — ABNORMAL LOW (ref >90)
Glucose, Bld: 102 mg/dL — ABNORMAL HIGH (ref 70–99)
Sodium: 137 meq/L (ref 135–145)
Total Bilirubin: 0.4 mg/dL (ref 0.3–1.2)

## 2013-02-24 LAB — POCT I-STAT, CHEM 8
BUN: 11 mg/dL (ref 6–23)
Calcium, Ion: 1.14 mmol/L (ref 1.13–1.30)
Chloride: 108 meq/L (ref 96–112)
Creatinine, Ser: 1.2 mg/dL — ABNORMAL HIGH (ref 0.50–1.10)
Glucose, Bld: 103 mg/dL — ABNORMAL HIGH (ref 70–99)
HCT: 24 % — ABNORMAL LOW (ref 36.0–46.0)
Hemoglobin: 8.2 g/dL — ABNORMAL LOW (ref 12.0–15.0)
Potassium: 3.9 meq/L (ref 3.5–5.1)
Sodium: 142 meq/L (ref 135–145)
TCO2: 22 mmol/L (ref 0–100)

## 2013-02-24 LAB — OCCULT BLOOD, POC DEVICE: Fecal Occult Bld: POSITIVE — AB

## 2013-02-24 LAB — PREPARE RBC (CROSSMATCH)

## 2013-02-24 LAB — COMPREHENSIVE METABOLIC PANEL
AST: 13 U/L (ref 0–37)
Albumin: 3 g/dL — ABNORMAL LOW (ref 3.5–5.2)
Alkaline Phosphatase: 83 U/L (ref 39–117)
BUN: 11 mg/dL (ref 6–23)
Potassium: 3.8 mEq/L (ref 3.5–5.1)
Total Protein: 7.4 g/dL (ref 6.0–8.3)

## 2013-02-24 LAB — PROTIME-INR
INR: 1.15 (ref 0.00–1.49)
Prothrombin Time: 14.5 seconds (ref 11.6–15.2)

## 2013-02-24 LAB — POCT I-STAT TROPONIN I: Troponin i, poc: 0 ng/mL (ref 0.00–0.08)

## 2013-02-24 LAB — MRSA PCR SCREENING: MRSA by PCR: NEGATIVE

## 2013-02-24 LAB — LIPASE, BLOOD: Lipase: 36 U/L (ref 11–59)

## 2013-02-24 LAB — APTT: aPTT: 33 s (ref 24–37)

## 2013-02-24 MED ORDER — ONDANSETRON HCL 4 MG/2ML IJ SOLN: 4.0000 mg | Freq: Four times a day (QID) | INTRAMUSCULAR | Status: DC | PRN

## 2013-02-24 MED ORDER — ZOLPIDEM TARTRATE 5 MG PO TABS: 5.0000 mg | ORAL_TABLET | Freq: Every evening | ORAL | Status: DC | PRN

## 2013-02-24 MED ORDER — AMIODARONE HCL 200 MG PO TABS
200.0000 mg | ORAL_TABLET | Freq: Every day | ORAL | Status: DC
  Administered 2013-02-26 – 2013-02-28 (×3): 200 mg via ORAL
  Filled 2013-02-24 (×4): qty 1

## 2013-02-24 MED ORDER — IOHEXOL 300 MG/ML  SOLN
50.0000 mL | Freq: Once | INTRAMUSCULAR | Status: AC | PRN
  Administered 2013-02-24: 50 mL via ORAL

## 2013-02-24 MED ORDER — ONDANSETRON HCL 4 MG/2ML IJ SOLN
4.0000 mg | Freq: Once | INTRAMUSCULAR | Status: AC
Start: 2013-02-24 — End: 2013-02-24
  Administered 2013-02-24: 4 mg via INTRAVENOUS
  Filled 2013-02-24: qty 2

## 2013-02-24 MED ORDER — ACETAMINOPHEN 325 MG PO TABS
650.0000 mg | ORAL_TABLET | Freq: Four times a day (QID) | ORAL | Status: DC | PRN
  Filled 2013-02-24: qty 2

## 2013-02-24 MED ORDER — TEMAZEPAM 15 MG PO CAPS
15.0000 mg | ORAL_CAPSULE | Freq: Every evening | ORAL | Status: DC | PRN
  Administered 2013-02-25 – 2013-02-27 (×3): 15 mg via ORAL
  Filled 2013-02-24 (×3): qty 1

## 2013-02-24 MED ORDER — PEG 3350-KCL-NA BICARB-NACL 420 G PO SOLR
4000.0000 mL | Freq: Once | ORAL | Status: AC
  Administered 2013-02-25: 4000 mL via ORAL
  Filled 2013-02-24: qty 4000

## 2013-02-24 MED ORDER — HYDROMORPHONE HCL PF 1 MG/ML IJ SOLN
0.5000 mg | INTRAMUSCULAR | Status: AC | PRN
  Administered 2013-02-24 – 2013-02-25 (×3): 0.5 mg via INTRAVENOUS
  Filled 2013-02-24 (×3): qty 1

## 2013-02-24 MED ORDER — MORPHINE SULFATE 2 MG/ML IJ SOLN
1.0000 mg | INTRAMUSCULAR | Status: DC | PRN
  Administered 2013-02-25 – 2013-02-28 (×7): 1 mg via INTRAVENOUS
  Filled 2013-02-24 (×7): qty 1

## 2013-02-24 MED ORDER — SODIUM CHLORIDE 0.9 % IV SOLN
INTRAVENOUS | Status: DC
  Administered 2013-02-24 – 2013-02-25 (×2): via INTRAVENOUS

## 2013-02-24 MED ORDER — IOHEXOL 300 MG/ML  SOLN
100.0000 mL | Freq: Once | INTRAMUSCULAR | Status: AC | PRN
  Administered 2013-02-24: 100 mL via INTRAVENOUS

## 2013-02-24 MED ORDER — ACETAMINOPHEN 650 MG RE SUPP: 650.0000 mg | Freq: Four times a day (QID) | RECTAL | Status: DC | PRN

## 2013-02-24 MED ORDER — PANTOPRAZOLE SODIUM 40 MG PO TBEC
40.0000 mg | DELAYED_RELEASE_TABLET | Freq: Two times a day (BID) | ORAL | Status: DC
  Administered 2013-02-24 – 2013-02-28 (×8): 40 mg via ORAL
  Filled 2013-02-24 (×9): qty 1

## 2013-02-24 MED ORDER — METOPROLOL TARTRATE 12.5 MG HALF TABLET
12.5000 mg | ORAL_TABLET | Freq: Two times a day (BID) | ORAL | Status: DC
  Administered 2013-02-25 – 2013-02-28 (×6): 12.5 mg via ORAL
  Filled 2013-02-24 (×10): qty 1

## 2013-02-24 MED ORDER — DIPHENHYDRAMINE HCL 25 MG PO CAPS
25.0000 mg | ORAL_CAPSULE | Freq: Once | ORAL | Status: AC
  Administered 2013-02-24: 25 mg via ORAL
  Filled 2013-02-24: qty 1

## 2013-02-24 MED ORDER — ONDANSETRON HCL 4 MG PO TABS: 4.0000 mg | ORAL_TABLET | Freq: Four times a day (QID) | ORAL | Status: DC | PRN

## 2013-02-24 MED ORDER — SODIUM CHLORIDE 0.9 % IV BOLUS (SEPSIS)
500.0000 mL | Freq: Once | INTRAVENOUS | Status: AC
  Administered 2013-02-24: 500 mL via INTRAVENOUS

## 2013-02-24 MED ORDER — CITALOPRAM HYDROBROMIDE 10 MG PO TABS
10.0000 mg | ORAL_TABLET | Freq: Every day | ORAL | Status: DC
  Administered 2013-02-25 – 2013-02-28 (×4): 10 mg via ORAL
  Filled 2013-02-24 (×4): qty 1

## 2013-02-24 MED ORDER — SODIUM CHLORIDE 0.9 % IJ SOLN
3.0000 mL | Freq: Two times a day (BID) | INTRAMUSCULAR | Status: DC
  Administered 2013-02-25 – 2013-02-28 (×3): 3 mL via INTRAVENOUS

## 2013-02-24 MED ORDER — PANTOPRAZOLE SODIUM 40 MG IV SOLR
40.0000 mg | Freq: Once | INTRAVENOUS | Status: AC
Start: 2013-02-24 — End: 2013-02-24
  Administered 2013-02-24: 40 mg via INTRAVENOUS
  Filled 2013-02-24: qty 40

## 2013-02-24 NOTE — Progress Notes (Signed)
CRITICAL VALUE ALERT  Critical value received:  HGB 6.0  Date of notification:  02/24/13  Time of notification:  2320  Critical value read back: yes  Nurse who received alert:  K. Cristy Friedlander RN CCRN  MD notified (1st page):  N/A- consistent with previous pt getting blood  Time of first page:  N/A  MD notified (2nd page):N/A  Time of second page:N/A  Responding MD:  N/A  Time MD responded:  N/A

## 2013-02-24 NOTE — ED Provider Notes (Signed)
History     CSN: [id number]  Arrival date & time 02/24/13  1423   First MD Initiated Contact with Patient 02/24/13 1509      No chief complaint on file.   (Consider location/radiation/quality/duration/timing/severity/associated sxs/prior treatment) HPI Comments: Pt with h/o hemorrhoids in the past, no abd surgeries in the past, reports was sick last week with upper abd pain and N/V lasting several days. She reports when she tried to eat or drink, would vomit immediately afterwards.  No urinary freq, flank pain, CP, SOB, coughing.  Thinks she had fevers, chills.  Still does.  Began to be able to eat last 3-4 days, but has now developed bright red blood per rectum with sensation of need to have a BM persistently.  No melena.  No longer vomiting, still with upper epigastric pain.  She is not on blood thinners.  She spoke to her cardiologist Dr. Jacinto Halim and was apaprently arranged to have blood work done which she has not had done yet, and also referred to Dr. Elnoria Howard, but today because of feeling light headed, palpitations, came to the ED.    The history is provided by the patient.    Past Medical History  Diagnosis Date  . Hypertension   . Borderline diabetes   . Irregular heart beat   . Hyperlipidemia   . CHF (congestive heart failure)   . CKD (chronic kidney disease) stage 3, GFR 30-59 ml/min     Past Surgical History  Procedure Laterality Date  . Hemorrhoid surgery      Family History  Problem Relation Age of Onset  . Kidney disease Mother   . Heart attack Mother   . Kidney disease Father   . Hypertension Mother   . Breast cancer Daughter   . Heart disease Brother     History  Substance Use Topics  . Smoking status: Current Every Day Smoker -- 0.50 packs/day for 45 years    Types: Cigarettes  . Smokeless tobacco: Current User  . Alcohol Use: No    OB History   Grav Para Term Preterm Abortions TAB SAB Ect Mult Living                  Review of Systems   Constitutional: Positive for fever, chills and appetite change.  HENT: Negative for congestion, rhinorrhea and postnasal drip.   Eyes: Negative for visual disturbance.  Cardiovascular: Positive for palpitations. Negative for chest pain.  Gastrointestinal: Positive for nausea, vomiting, abdominal pain, blood in stool and anal bleeding. Negative for diarrhea, constipation and abdominal distention.  Endocrine: Negative for polydipsia and polyuria.  Genitourinary: Negative for dysuria, urgency, flank pain and difficulty urinating.  Musculoskeletal: Negative for back pain.  Skin: Negative for color change and rash.  Neurological: Positive for light-headedness. Negative for dizziness and syncope.  Hematological: Does not bruise/bleed easily.  All other systems reviewed and are negative.    Allergies  Penicillins  Home Medications   Current Outpatient Rx  Name  Route  Sig  Dispense  Refill  . amiodarone (PACERONE) 200 MG tablet   Oral   Take 200 mg by mouth daily.         Marland Kitchen aspirin 325 MG EC tablet   Oral   Take 325 mg by mouth daily.         . Calcium Carb-Cholecalciferol (CALCIUM + D3 PO)   Oral   Take 1 tablet by mouth daily.         . citalopram (CELEXA)  20 MG tablet   Oral   Take 20 mg by mouth at bedtime.          . docusate sodium (COLACE) 100 MG capsule   Oral   Take 100 mg by mouth daily.          . furosemide (LASIX) 40 MG tablet   Oral   Take 40 mg by mouth every morning.          . Iron Combinations (IRON COMPLEX PO)   Oral   Take 50 mg by mouth daily.         Marland Kitchen losartan (COZAAR) 25 MG tablet   Oral   Take 25 mg by mouth daily.         . metoCLOPramide (REGLAN) 5 MG tablet   Oral   Take 5 mg by mouth 4 (four) times daily.         . metoprolol tartrate (LOPRESSOR) 25 MG tablet   Oral   Take 12.5 mg by mouth 2 (two) times daily.         . naproxen (NAPROSYN) 500 MG tablet   Oral   Take 1 tablet (500 mg total) by mouth 2 (two)  times daily with a meal.   30 tablet   0   . pantoprazole (PROTONIX) 40 MG tablet   Oral   Take 40 mg by mouth daily.         . potassium chloride SA (K-DUR,KLOR-CON) 20 MEQ tablet   Oral   Take 1 tablet (20 mEq total) by mouth 2 (two) times daily.   60 tablet   0   . pravastatin (PRAVACHOL) 20 MG tablet   Oral   Take 1 tablet (20 mg total) by mouth at bedtime.   30 tablet   1   . temazepam (RESTORIL) 15 MG capsule   Oral   Take 15 mg by mouth at bedtime as needed. For sleep.         . vitamin B-12 (CYANOCOBALAMIN) 1000 MCG tablet   Oral   Take 1,000 mcg by mouth daily.           BP 134/78  Pulse 68  Temp(Src) 98.1 F (36.7 C) (Oral)  Resp 31  SpO2 98%  Physical Exam  Nursing note and vitals reviewed. Constitutional: She appears well-developed and well-nourished.  HENT:  Head: Normocephalic and atraumatic.  Eyes: EOM are normal. No scleral icterus.  Cardiovascular: Normal rate and regular rhythm.   Pulmonary/Chest: Effort normal. No respiratory distress. She has no wheezes.  Abdominal: Soft. She exhibits no distension. There is tenderness. There is no rebound and no guarding.  Genitourinary: Rectal exam shows external hemorrhoid. Rectal exam shows no mass, no tenderness and anal tone normal. Guaiac positive stool.  Chaperone present  Neurological: She is alert.  Skin: Skin is warm.    ED Course  Procedures (including critical care time)  Labs Reviewed  CBC WITH DIFFERENTIAL - Abnormal; Notable for the following:    RBC 3.38 (*)    Hemoglobin 6.1 (*)    HCT 21.1 (*)    MCV 62.4 (*)    MCH 18.0 (*)    MCHC 28.9 (*)    RDW 18.6 (*)    Platelets 441 (*)    All other components within normal limits  COMPREHENSIVE METABOLIC PANEL - Abnormal; Notable for the following:    Glucose, Bld 102 (*)    Creatinine, Ser 1.20 (*)    Albumin 3.0 (*)  GFR calc non Af Amer 47 (*)    GFR calc Af Amer 55 (*)    All other components within normal limits   OCCULT BLOOD, POC DEVICE - Abnormal; Notable for the following:    Fecal Occult Bld POSITIVE (*)    All other components within normal limits  POCT I-STAT, CHEM 8 - Abnormal; Notable for the following:    Creatinine, Ser 1.20 (*)    Glucose, Bld 103 (*)    Hemoglobin 8.2 (*)    HCT 24.0 (*)    All other components within normal limits  LIPASE, BLOOD  URINALYSIS, ROUTINE W REFLEX MICROSCOPIC  APTT  PROTIME-INR  POCT I-STAT TROPONIN I  PREPARE RBC (CROSSMATCH)   Ct Abdomen Pelvis W Contrast  02/24/2013  *RADIOLOGY REPORT*  Clinical Data: Rectal bleeding.  Abdominal pain.  CT ABDOMEN AND PELVIS WITH CONTRAST  Technique:  Multidetector CT imaging of the abdomen and pelvis was performed following the standard protocol during bolus administration of intravenous contrast.  Contrast: OMNIPAQUE IOHEXOL 300 MG/ML  SOLN, 50mL OMNIPAQUE IOHEXOL 300 MG/ML  SOLN  Comparison: 05/12/2011  Findings: Ground-glass opacities and interlobular septal thickening at both lung bases right greater than left are nonspecific.  Small right pleural effusion.  There is an irregular margins suggesting an element of loculation.  Calcified granulomata in the liver.  Sludge in the gallbladder.  Spleen, pancreas, adrenal glands are within normal limits.  Mild chronic changes of the kidneys.  14 mm short axis diameter portal node on image 22.  No retroperitoneal abnormal adenopathy.  Normal appendix.  Bladder, uterus, and adnexa are within normal limits.  Trace free fluid in the pelvis.  Degenerative disc disease in the lumbar spine.  No vertebral compression deformity.  Stable lytic lesion in the right anterior acetabulum.  The  IMPRESSION: Irregular lung markings at both lung bases.  A inflammatory process is favored.  There is a coexisting right pleural effusion with elements of loculation.  Single mildly enlarged periportal lymph node.  This is nonspecific. Inflammatory and neoplastic etiology are in the differential  diagnosis.  Follow-up is suggested.  No obvious bowel pathology.   Original Report Authenticated By: Jolaine Click, M.D.      1. GI bleeding   2. Anemia      ra sat is 95% and I interpret to be normal  ECG at time 15:31 shows SR at rate 57, normal axis, left atrial enlargement, no ST or T wave abn's.  No sig change compared to ECG from 12/18/12.   7:00 PM Patient's hemoglobin is 6.1, prior values were in the 9-10 range. Do to lower GI bleed by Hemoccult, patient will require admission and I think due to her symptoms of lightheadedness and palpitations as well as factor in her age, likely would benefit from transfusion of packed blood cells. She is hemodynamically stable, does not have any chest pain. I think it is okay for GI to be consulted in the hospital to be seen tomorrow. Patient has not established, but does have a outstanding appointment with Dr. Jeani Hawking arranged either by Dr. Jacinto Halim or her primary care physician. I spoken to the hospitalist will see the patient and admit.  MDM  Pt with mild to moderate upper epigastric tenderness but neg Murphy's, has had persistent N/V last week, is mildly tachycardic initially at arrival, now HR is normal.  Has had apparently BRBPR, will check orthostatics, blood counts and monitor carefully.  Gently IVF boluses given h/o CHF.  Gavin Pound. Oletta Lamas, MD 02/24/13 2028

## 2013-02-24 NOTE — ED Notes (Signed)
Pt now says her stomach hurts with pain 6/10 and is intermittent.

## 2013-02-24 NOTE — H&P (Signed)
[facility]                                                                                    Patient Demographics  [name], is a 62 y.o. female  CSN: [id number]  MRN: [id number]  DOB - 1951/05/22  Admit Date - 02/24/2013  Outpatient Primary MD for the patient is OSEI-BONSU,GEORGE, MD   With History of -  Past Medical History  Diagnosis Date  . Hypertension   . Borderline diabetes   . Irregular heart beat   . Hyperlipidemia   . CHF (congestive heart failure)   . CKD (chronic kidney disease) stage 3, GFR 30-59 ml/min       Past Surgical History  Procedure Laterality Date  . Hemorrhoid surgery      in for   No chief complaint on file.    HPI  [name]  is a 62 y.o. female, with past medical history significant for anemia NOS and also congestive heart failure presenting today with one-day history of weakness dizziness and increased dyspnea on exertion. Patient denies any chest pains. The patient has history of anemia and was scheduled to have an EGD and colonoscopy with Dr. Elnoria Howard as outpatient. Patient noticed some blood on the napkin when she went to the bathroom today. No history of melena or frank blood per rectum.    Review of Systems    In addition to the HPI above,  No Fever-chills, No Headache, No changes with Vision or hearing, No problems swallowing food or Liquids, No Chest pain, Cough or Shortness of Breath, Periumbilical Abdominal pain, No Nausea or Vommitting, Bowel movements are regular, No dysuria, No new skin rashes or bruises, No new joints pains-aches,  No new weakness, tingling, numbness in any extremity, No recent weight gain or loss, No polyuria, polydypsia or polyphagia, No significant Mental Stressors.  A full 10 point Review of Systems was done, except as stated above, all other Review of Systems were negative.   Social History History  Substance Use Topics  . Smoking status: Current Every Day Smoker  -- 0.50 packs/day for 45 years    Types: Cigarettes  . Smokeless tobacco: Current User  . Alcohol Use: No     Family History Family History  Problem Relation Age of Onset  . Kidney disease Mother   . Heart attack Mother   . Kidney disease Father   . Hypertension Mother   . Breast cancer Daughter   . Heart disease Brother      Prior to Admission medications   Medication Sig Start Date End Date Taking? Authorizing Provider  amiodarone (PACERONE) 200 MG tablet Take 200 mg by mouth daily.   Yes Historical Provider, MD  Calcium Carb-Cholecalciferol (CALCIUM + D3 PO) Take 1 tablet by mouth daily.   Yes Historical Provider, MD  citalopram (CELEXA) 10 MG tablet Take 10 mg by mouth daily.   Yes Historical Provider, MD  docusate sodium (COLACE) 100 MG capsule Take 100 mg by mouth daily.    Yes Historical Provider, MD  furosemide (LASIX) 40 MG tablet Take 40 mg by mouth daily.    Yes Historical Provider, MD  Iron Combinations (IRON COMPLEX PO) Take 50 mg by mouth daily.   Yes Historical Provider, MD  metoCLOPramide (REGLAN) 5 MG tablet Take 5 mg by mouth 4 (four) times daily.   Yes Historical Provider, MD  metoprolol tartrate (LOPRESSOR) 25 MG tablet Take 12.5 mg by mouth 2 (two) times daily.   Yes Historical Provider, MD  pantoprazole (PROTONIX) 40 MG tablet Take 40 mg by mouth daily.   Yes Historical Provider, MD  potassium chloride SA (K-DUR,KLOR-CON) 20 MEQ tablet Take 1 tablet (20 mEq total) by mouth 2 (two) times daily. 05/26/12  Yes Richarda Overlie, MD  pravastatin (PRAVACHOL) 20 MG tablet Take 1 tablet (20 mg total) by mouth at bedtime. 10/03/12  Yes Renae Fickle, MD  temazepam (RESTORIL) 15 MG capsule Take 15 mg by mouth at bedtime.    Yes Historical Provider, MD  vitamin B-12 (CYANOCOBALAMIN) 1000 MCG tablet Take 1,000 mcg by mouth daily.   Yes Historical Provider, MD    Allergies  Allergen Reactions  . Penicillins Swelling    Physical Exam  Vitals  Blood pressure 134/78,  pulse 68, temperature 98.1 F (36.7 C), temperature source Oral, resp. rate 31, SpO2 98.00%.   1. General African American female lying in bed looks clammy.  2. Normal affect and insight, Not Suicidal or Homicidal, Awake Alert, Oriented X 3.  3. No F.N deficits, ALL C.Nerves Intact, Strength 5/5 all 4 extremities, Sensation intact all 4 extremities, Plantars down going.  4. Ears and Eyes appear Normal, Conjunctivae clear, PERRLA. Moist Oral Mucosa.  5. Supple Neck, No JVD, No cervical lymphadenopathy appriciated, No Carotid Bruits.  6. Symmetrical Chest wall movement, Good air movement bilaterally, CTAB.  7. RRR, No Gallops, Rubs or Murmurs, No Parasternal Heave.  8. Positive Bowel Sounds, Abdomen Soft, Non tender, No organomegaly appriciated,No rebound -guarding or rigidity.  9.  No Cyanosis, Normal Skin Turgor, No Skin Rash or Bruise.  10. Good muscle tone,  joints appear normal , no effusions, Normal ROM.  11. No Palpable Lymph Nodes in Neck or Axillae    Data Review  CBC  Recent Labs Lab 02/24/13 1710 02/24/13 1721  WBC 9.2  --   HGB 6.1* 8.2*  HCT 21.1* 24.0*  PLT 441*  --   MCV 62.4*  --   MCH 18.0*  --   MCHC 28.9*  --   RDW 18.6*  --   LYMPHSABS 1.7  --   MONOABS 0.5  --   EOSABS 0.2  --   BASOSABS 0.1  --    ------------------------------------------------------------------------------------------------------------------  Chemistries   Recent Labs Lab 02/24/13 1710 02/24/13 1721  NA 137 142  K 3.8 3.9  CL 104 108  CO2 23  --   GLUCOSE 102* 103*  BUN 11 11  CREATININE 1.20* 1.20*  CALCIUM 8.7  --   AST 13  --   ALT 13  --   ALKPHOS 83  --   BILITOT 0.4  --    ------------------------------------------------------------------------------------------------------------------  Coagulation profile  Recent Labs Lab 02/24/13 1710  INR 1.15    -------------------------------------------------------------------------------------------------------------------  ---------------------------------------------------------------------------------------------------------------  Urinalysis    Component Value Date/Time   COLORURINE YELLOW 02/24/2013 1531   APPEARANCEUR CLEAR 02/24/2013 1531   LABSPEC 1.013 02/24/2013 1531   PHURINE 5.5 02/24/2013 1531   GLUCOSEU NEGATIVE 02/24/2013 1531   HGBUR NEGATIVE 02/24/2013 1531   BILIRUBINUR NEGATIVE 02/24/2013 1531   KETONESUR NEGATIVE 02/24/2013 1531   PROTEINUR NEGATIVE 02/24/2013 1531   UROBILINOGEN 1.0 02/24/2013 1531  NITRITE NEGATIVE 02/24/2013 1531   LEUKOCYTESUR NEGATIVE 02/24/2013 1531    ----------------------------------------------------------------------------------------------------------------     Imaging results:   Ct Abdomen Pelvis W Contrast  02/24/2013  *RADIOLOGY REPORT*  Clinical Data: Rectal bleeding.  Abdominal pain.  CT ABDOMEN AND PELVIS WITH CONTRAST  Technique:  Multidetector CT imaging of the abdomen and pelvis was performed following the standard protocol during bolus administration of intravenous contrast.  Contrast: OMNIPAQUE IOHEXOL 300 MG/ML  SOLN, 50mL OMNIPAQUE IOHEXOL 300 MG/ML  SOLN  Comparison: 05/12/2011  Findings: Ground-glass opacities and interlobular septal thickening at both lung bases right greater than left are nonspecific.  Small right pleural effusion.  There is an irregular margins suggesting an element of loculation.  Calcified granulomata in the liver.  Sludge in the gallbladder.  Spleen, pancreas, adrenal glands are within normal limits.  Mild chronic changes of the kidneys.  14 mm short axis diameter portal node on image 22.  No retroperitoneal abnormal adenopathy.  Normal appendix.  Bladder, uterus, and adnexa are within normal limits.  Trace free fluid in the pelvis.  Degenerative disc disease in the lumbar spine.  No vertebral compression deformity.   Stable lytic lesion in the right anterior acetabulum.  The  IMPRESSION: Irregular lung markings at both lung bases.  A inflammatory process is favored.  There is a coexisting right pleural effusion with elements of loculation.  Single mildly enlarged periportal lymph node.  This is nonspecific. Inflammatory and neoplastic etiology are in the differential diagnosis.  Follow-up is suggested.  No obvious bowel pathology.   Original Report Authenticated By: Jolaine Click, M.D.       Assessment & Plan  1. GI bleed with severe anemia, patient is on Naprosyn and was on aspirin until yesterday. Discussed with Dr. Bernarda Caffey who will scope the patient in a.m. if stable. We'll transfuse 2 units of packed RBC today and follow hemoglobin and hematocrits every 6 hours. Will hold most of her by mouth medications. Patient is going to step down.  2. Diastolic congestive heart failure  3. History of anemia was supposed to follow up with Dr. Juliette Alcide . Patient has target cells on her CBC  4. History of hypertension patient had orthostatic hypotension in the ER  5. History of chronic kidney disease creatinine is 1.2  6. History of hemorrhoids surgery in the past  7. Periportal lymph node enlargement suggestive of malignant process to be followed while she is inpatient.   DVT Prophylaxis SCDs  AM Labs Ordered, also please review Full Orders  Family Communication: Admission, patients condition and plan of care including tests being ordered have been discussed with the patient  who indicates understanding and agree with the plan and Code Status.  Code Status full  Disposition Plan: Home  Time spent in minutes : 45 minutes  Condition GUARDED

## 2013-02-24 NOTE — ED Notes (Signed)
Pt reports being sick last week with "stomach problems with lots of vomiting". Today she reports that her stomach does not hurt but " it feels like I have to go to the bathroom real bad." Pt says denies diarrhea and is having normal bowel movements with bright red blood when she wipes.

## 2013-02-24 NOTE — ED Notes (Signed)
Pt reports pain to lower abdomen and knees. Pt states she has a ride home. Pt has abdominal pain off and on for the past week. Pt denies n/v. Pt states she has knee pain "every time I walk." States knee pain is chronic.

## 2013-02-24 NOTE — ED Notes (Signed)
Patient transported to CT 

## 2013-02-24 NOTE — ED Notes (Signed)
Pt put on 2L nasal cannula due to continuous low O2 states ranging from 78-90%.

## 2013-02-25 ENCOUNTER — Encounter (HOSPITAL_COMMUNITY)
Admission: EM | Disposition: A | Discharge status: Home / Self Care | Payer: Self-pay | Source: Home / Self Care | Attending: Internal Medicine

## 2013-02-25 ENCOUNTER — Encounter (HOSPITAL_COMMUNITY): Payer: Self-pay | Admitting: *Deleted

## 2013-02-25 DIAGNOSIS — K922 Gastrointestinal hemorrhage, unspecified: Secondary | ICD-10-CM | POA: Diagnosis not present

## 2013-02-25 DIAGNOSIS — I4891 Unspecified atrial fibrillation: Secondary | ICD-10-CM | POA: Diagnosis not present

## 2013-02-25 DIAGNOSIS — D649 Anemia, unspecified: Secondary | ICD-10-CM | POA: Diagnosis not present

## 2013-02-25 HISTORY — PX: COLONOSCOPY: SHX5424

## 2013-02-25 HISTORY — PX: ESOPHAGOGASTRODUODENOSCOPY: SHX5428

## 2013-02-25 LAB — HEMOGLOBIN
Hemoglobin: 8.1 g/dL — ABNORMAL LOW (ref 12.0–15.0)
Hemoglobin: 8.9 g/dL — ABNORMAL LOW (ref 12.0–15.0)

## 2013-02-25 SURGERY — EGD (ESOPHAGOGASTRODUODENOSCOPY)
Anesthesia: Moderate Sedation

## 2013-02-25 MED ORDER — MIDAZOLAM HCL 5 MG/5ML IJ SOLN
INTRAMUSCULAR | Status: DC | PRN
  Administered 2013-02-25 (×5): 2 mg via INTRAVENOUS

## 2013-02-25 MED ORDER — FENTANYL CITRATE 0.05 MG/ML IJ SOLN
INTRAMUSCULAR | Status: AC
  Filled 2013-02-25: qty 4

## 2013-02-25 MED ORDER — BUTAMBEN-TETRACAINE-BENZOCAINE 2-2-14 % EX AERO
INHALATION_SPRAY | CUTANEOUS | Status: DC | PRN
  Administered 2013-02-25: 2 via TOPICAL

## 2013-02-25 MED ORDER — FENTANYL CITRATE 0.05 MG/ML IJ SOLN
INTRAMUSCULAR | Status: DC | PRN
  Administered 2013-02-25 (×4): 25 ug via INTRAVENOUS

## 2013-02-25 MED ORDER — DIPHENHYDRAMINE HCL 50 MG/ML IJ SOLN
INTRAMUSCULAR | Status: AC
  Filled 2013-02-25: qty 1

## 2013-02-25 MED ORDER — DIPHENHYDRAMINE HCL 50 MG/ML IJ SOLN
INTRAMUSCULAR | Status: DC | PRN
  Administered 2013-02-25: 25 mg via INTRAVENOUS

## 2013-02-25 MED ORDER — MIDAZOLAM HCL 10 MG/2ML IJ SOLN
INTRAMUSCULAR | Status: AC
  Filled 2013-02-25: qty 4

## 2013-02-25 MED ORDER — SODIUM CHLORIDE 0.9 % IV SOLN: INTRAVENOUS | Status: DC

## 2013-02-25 MED ORDER — SODIUM CHLORIDE 0.9 % IV SOLN
INTRAVENOUS | Status: DC
  Administered 2013-02-25: 500 mL via INTRAVENOUS

## 2013-02-25 NOTE — Op Note (Signed)
[facility] [address] [place], [address]   OPERATIVE PROCEDURE REPORT  PATIENT: [name], [name]  MR#: [id number] BIRTHDATE: 07-25-1951  GENDER: Female ENDOSCOPIST: Jeani Hawking, MD ASSISTANT:   Felecia Shelling, RN and Blair Dolphin, technician PROCEDURE DATE: 02/25/2013 PROCEDURE:   EGD, diagnostic ASA CLASS:   Class III INDICATIONS:Iron deficiency anemia. MEDICATIONS: Versed 7 mg IV, Fentanyl 75 mcg IV, and Benadryl 25 mg IV TOPICAL ANESTHETIC:   none  DESCRIPTION OF PROCEDURE:   After the risks benefits and alternatives of the procedure were thoroughly explained, informed consent was obtained.  The endoscope [id number]  endoscope was introduced through the mouth  and advanced to the second portion of the duodenum Without limitations.      The instrument was slowly withdrawn as the mucosa was fully examined.    FINDINGS: The upper, middle and distal third of the esophagus were carefully inspected and no abnormalities were noted.  The z-line was well seen at the GEJ.  The endoscope was pushed into the fundus which was normal including a retroflexed view.  The antrum, gastric body, first and second part of the duodenum were unremarkable. Retroflexed views revealed no abnormalities.     The scope was then withdrawn from the patient and the procedure terminated.  COMPLICATIONS: There were no complications. IMPRESSION: 1) Normal EGD  RECOMMENDATIONS: 1) Proceed with the colonoscopy.   _______________________________ eSignedJeani Hawking, MD 02/25/2013 1:24 PM

## 2013-02-25 NOTE — Progress Notes (Signed)
[facility] PROGRESS NOTE  [name] ZOX:[id number] DOB: 26-Jun-1951 DOA: 02/24/2013 PCP: [name], MD  Assessment/Plan:  1. GI bleed with severe anemia, patient is on Naprosyn and was on aspirin until yesterday. Plan for EGD/COLOnoscopy today. S/p  2 units of packed RBC transfusion and H&H is much improved today. Will hold most of her by mouth medications. 2. Diastolic congestive heart failure : appears to be compensated.  3. Anemia : outpatient follow up with [name] is scheduled. 4. History of hypertension patient had orthostatic hypotension in the ER ; possibly from loss of blood . On IV  FLUIDS.  5. History of chronic kidney disease creatinine is 1.2 : gentle hydration and repeat bMP in am.  6. History of hemorrhoids surgery in the past  7. Periportal lymph node enlargement suggestive of malignant process/ Inflammatory process in the differential. Will be closely monitored.   8. Atrial fibrillation: she is in sinus bradycardia today. We will hold amiodarone and metoprolol. Her last echo was last year with mild LVH and diastolic dysfunction. She follows up with [name] as outpatient.   DVT prophylaxis.    Code Status: FULL CODE  Family Communication: none at bedside Disposition Plan: pending.    Consultants:  GI   Procedures: Pending EGD/COLONOSCOPY by Dr Elnoria Howard. HPI/Subjective: No new complaints.  Objective: Filed Vitals:   02/25/13 0300 02/25/13 0400 02/25/13 0800 02/25/13 0855  BP: 152/68 151/71 146/70 136/50  Pulse: 48 46 49 46  Temp: 98.8 F (37.1 C) 98.7 F (37.1 C) 98.4 F (36.9 C)   TempSrc: Oral Oral Oral   Resp: 16 17 20 19   Height:      Weight:      SpO2:  93% 98% 93%    Intake/Output Summary (Last 24 hours) at 02/25/13 1133 Last data filed at 02/25/13 1100  Gross per 24 hour  Intake   4925 ml  Output   1550 ml  Net   3375 ml   Filed Weights   02/24/13 2230  Weight: 87.1 kg (192 lb 0.3 oz)    Exam: Alert afebrile  comfortable Lungs: Symmetrical Chest wall movement, Good air movement bilaterally, CTAB.  CVS:  RRR, No Gallops, Rubs or Murmurs, No Parasternal Heave.  Abdomen:  Positive Bowel Sounds, Abdomen Soft, Non tender, No organomegaly appriciated,No rebound -guarding or rigidity.   skin:  No Cyanosis, Normal Skin Turgor, No Skin Rash or Bruise.   Data Reviewed: Basic Metabolic Panel:  Recent Labs Lab 02/24/13 1710 02/24/13 1721  NA 137 142  K 3.8 3.9  CL 104 108  CO2 23  --   GLUCOSE 102* 103*  BUN 11 11  CREATININE 1.20* 1.20*  CALCIUM 8.7  --    Liver Function Tests:  Recent Labs Lab 02/24/13 1710  AST 13  ALT 13  ALKPHOS 83  BILITOT 0.4  PROT 7.4  ALBUMIN 3.0*    Recent Labs Lab 02/24/13 1710  LIPASE 36   No results found for this basename: AMMONIA,  in the last 168 hours CBC:  Recent Labs Lab 02/24/13 1710 02/24/13 1721 02/24/13 2300 02/25/13 0659  WBC 9.2  --   --   --   NEUTROABS 6.7  --   --   --   HGB 6.1* 8.2* 6.0* 8.1*  HCT 21.1* 24.0*  --   --   MCV 62.4*  --   --   --   PLT 441*  --   --   --    Cardiac  Enzymes: No results found for this basename: CKTOTAL, CKMB, CKMBINDEX, TROPONINI,  in the last 168 hours BNP (last 3 results)  Recent Labs  03/04/12 0510 05/24/12 1634 10/01/12 1300  PROBNP 2374.0* 2614.0* 1975.0*   CBG: No results found for this basename: GLUCAP,  in the last 168 hours  Recent Results (from the past 240 hour(s))  MRSA PCR SCREENING     Status: None   Collection Time    02/24/13 10:26 PM      Result Value Range Status   MRSA by PCR NEGATIVE  NEGATIVE Final   Comment:            The GeneXpert MRSA Assay (FDA     approved for NASAL specimens     only), is one component of a     comprehensive MRSA colonization     surveillance program. It is not     intended to diagnose MRSA     infection nor to guide or     monitor treatment for     MRSA infections.     Studies: Ct Abdomen Pelvis W Contrast  02/24/2013   *RADIOLOGY REPORT*  Clinical Data: Rectal bleeding.  Abdominal pain.  CT ABDOMEN AND PELVIS WITH CONTRAST  Technique:  Multidetector CT imaging of the abdomen and pelvis was performed following the standard protocol during bolus administration of intravenous contrast.  Contrast: OMNIPAQUE IOHEXOL 300 MG/ML  SOLN, 50mL OMNIPAQUE IOHEXOL 300 MG/ML  SOLN  Comparison: 05/12/2011  Findings: Ground-glass opacities and interlobular septal thickening at both lung bases right greater than left are nonspecific.  Small right pleural effusion.  There is an irregular margins suggesting an element of loculation.  Calcified granulomata in the liver.  Sludge in the gallbladder.  Spleen, pancreas, adrenal glands are within normal limits.  Mild chronic changes of the kidneys.  14 mm short axis diameter portal node on image 22.  No retroperitoneal abnormal adenopathy.  Normal appendix.  Bladder, uterus, and adnexa are within normal limits.  Trace free fluid in the pelvis.  Degenerative disc disease in the lumbar spine.  No vertebral compression deformity.  Stable lytic lesion in the right anterior acetabulum.  The  IMPRESSION: Irregular lung markings at both lung bases.  A inflammatory process is favored.  There is a coexisting right pleural effusion with elements of loculation.  Single mildly enlarged periportal lymph node.  This is nonspecific. Inflammatory and neoplastic etiology are in the differential diagnosis.  Follow-up is suggested.  No obvious bowel pathology.   Original Report Authenticated By: Jolaine Click, M.D.     Scheduled Meds: . amiodarone  200 mg Oral Daily  . citalopram  10 mg Oral Daily  . metoprolol tartrate  12.5 mg Oral BID  . pantoprazole  40 mg Oral BID  . sodium chloride  3 mL Intravenous Q12H   Continuous Infusions: . sodium chloride 100 mL/hr at 02/25/13 0800  . sodium chloride 20 mL/hr at 02/25/13 1123    Active Problems:   * No active hospital problems.  Kathlen Mody  [facility] Pager [phone]. If 7PM-7AM, please contact night-coverage at [email or web address], password [facility] 02/25/2013, 11:33 AM  LOS: 1 day

## 2013-02-25 NOTE — Progress Notes (Signed)
CARE MANAGEMENT NOTE 02/25/2013  Patient:  [name], [name]   Account Number:  [id number]  Date Initiated:  02/25/2013  Documentation initiated by:  DAVIS,RHONDA  Subjective/Objective Assessment:   pt with rectal bleeding, per hx, hgb 6.1, transfused     Action/Plan:   home   Anticipated DC Date:  02/28/2013   Anticipated DC Plan:  HOME/SELF CARE         Choice offered to / List presented to:             Status of service:  In process, will continue to follow Medicare Important Message given?  NA - LOS <3 / Initial given by admissions (If response is "NO", the following Medicare IM given date fields will be blank) Date Medicare IM given:   Date Additional Medicare IM given:    Discharge Disposition:    Per UR Regulation:  Reviewed for med. necessity/level of care/duration of stay  If discussed at Long Length of Stay Meetings, dates discussed:    Comments:  [id number] Earlene Plater, RN, BSN, CCM:  CHART REVIEWED AND UPDATED.  Next chart review due on [id number]. NO DISCHARGE NEEDS PRESENT AT THIS TIME. CASE MANAGEMENT [phone]

## 2013-02-25 NOTE — Op Note (Signed)
[facility] [address] [place], [address]   OPERATIVE PROCEDURE REPORT  PATIENT: Joy, [name]  MR#: [id number] BIRTHDATE: February 04, 1951  GENDER: Female ENDOSCOPIST: Jeani Hawking, MD ASSISTANT:   Felecia Shelling, RN Blair Dolphin, technician PROCEDURE DATE: 02/25/2013 PROCEDURE:   Colonoscopy, diagnostic ASA CLASS:   Class III INDICATIONS:Iron Deficiency Anemia. MEDICATIONS: Versed 3 mg IV and Fentanyl 25 mcg IV  DESCRIPTION OF PROCEDURE:   After the risks benefits and alternatives of the procedure were thoroughly explained, informed consent was obtained.  A digital rectal exam revealed no abnormalities of the rectum.    The Colonoscope [id number]  endoscope was introduced through the anus  and advanced to the cecum, which was identified by both the appendix and ileocecal valve , No adverse events experienced.    The quality of the prep was excellent. .  The instrument was then slowly withdrawn as the colon was fully examined.     FINDINGS: A normal appearing cecum, ileocecal valve, and appendiceal orifice were identified.  The ascending, hepatic flexure, transverse, splenic flexure, descending, sigmoid colon and rectum appeared unremarkable.  No polyps or cancers were seen. Retroflexed views revealed internal/external hemorrhoids.     The scope was then withdrawn from the patient and the procedure terminated.  COMPLICATIONS: There were no complications.  IMPRESSION: 1) Normal colon  RECOMMENDATIONS: 1) Capsule endoscopy.   _______________________________ eSignedJeani Hawking, MD 02/25/2013 1:45 PM

## 2013-02-25 NOTE — Progress Notes (Signed)
Tom, NP notified d/t pt has increased HR 120's (fib).  Pt has a hx of a-fib.  Previously some heart heart medications held d/t pt HR as low as 40's. Pt will receive scheduled lopressor tonight. No new orders received. Conley Rolls, RN

## 2013-02-25 NOTE — H&P (Signed)
Reason for Consult: Anemia and Heme positive stool Referring Physician: [facility].  [name] HPI: This is a 62 year old female with a PMH of anemia and heme positive stool admitted for DOE and weakness.  The admission blood work revealed that she was anemic with an HGB in the 6 range.  In the past she has undergone a GI work up by Drs. Elnoria Howard and [place] with negative findings.  An irritated hemorrhoid was identified by Dr. Christella Hartigan during a prior colonoscopy.  With her recurrent findings a GI consultation was requested.  Past Medical History  Diagnosis Date  . Hypertension   . Borderline diabetes   . Irregular heart beat   . Hyperlipidemia   . CHF (congestive heart failure)   . CKD (chronic kidney disease) stage 3, GFR 30-59 ml/min     Past Surgical History  Procedure Laterality Date  . Hemorrhoid surgery      Family History  Problem Relation Age of Onset  . Kidney disease Mother   . Heart attack Mother   . Kidney disease Father   . Hypertension Mother   . Breast cancer Daughter   . Heart disease Brother     Social History:  reports that she has been smoking Cigarettes.  She has a 22.5 pack-year smoking history. She uses smokeless tobacco. She reports that she uses illicit drugs. She reports that she does not drink alcohol.  Allergies:  Allergies  Allergen Reactions  . Penicillins Swelling    Medications:  Scheduled: . amiodarone  200 mg Oral Daily  . citalopram  10 mg Oral Daily  . metoprolol tartrate  12.5 mg Oral BID  . pantoprazole  40 mg Oral BID  . sodium chloride  3 mL Intravenous Q12H   Continuous: . sodium chloride 100 mL/hr at 02/25/13 0800  . sodium chloride      Results for orders placed during the hospital encounter of 02/24/13 (from the past 24 hour(s))  URINALYSIS, ROUTINE W REFLEX MICROSCOPIC     Status: None   Collection Time    02/24/13  3:31 PM      Result Value Range   Color, Urine YELLOW  YELLOW   APPearance CLEAR  CLEAR    Specific Gravity, Urine 1.013  1.005 - 1.030   pH 5.5  5.0 - 8.0   Glucose, UA NEGATIVE  NEGATIVE mg/dL   Hgb urine dipstick NEGATIVE  NEGATIVE   Bilirubin Urine NEGATIVE  NEGATIVE   Ketones, ur NEGATIVE  NEGATIVE mg/dL   Protein, ur NEGATIVE  NEGATIVE mg/dL   Urobilinogen, UA 1.0  0.0 - 1.0 mg/dL   Nitrite NEGATIVE  NEGATIVE   Leukocytes, UA NEGATIVE  NEGATIVE  OCCULT BLOOD, POC DEVICE     Status: Abnormal   Collection Time    02/24/13  4:48 PM      Result Value Range   Fecal Occult Bld POSITIVE (*) NEGATIVE  CBC WITH DIFFERENTIAL     Status: Abnormal   Collection Time    02/24/13  5:10 PM      Result Value Range   WBC 9.2  4.0 - 10.5 K/uL   RBC 3.38 (*) 3.87 - 5.11 MIL/uL   Hemoglobin 6.1 (*) 12.0 - 15.0 g/dL   HCT 16.1 (*) 09.6 - 04.5 %   MCV 62.4 (*) 78.0 - 100.0 fL   MCH 18.0 (*) 26.0 - 34.0 pg   MCHC 28.9 (*) 30.0 - 36.0 g/dL   RDW 40.9 (*) 81.1 - 91.4 %  Platelets 441 (*) 150 - 400 K/uL   Neutrophils Relative 74  43 - 77 %   Lymphocytes Relative 18  12 - 46 %   Monocytes Relative 5  3 - 12 %   Eosinophils Relative 2  0 - 5 %   Basophils Relative 1  0 - 1 %   Neutro Abs 6.7  1.7 - 7.7 K/uL   Lymphs Abs 1.7  0.7 - 4.0 K/uL   Monocytes Absolute 0.5  0.1 - 1.0 K/uL   Eosinophils Absolute 0.2  0.0 - 0.7 K/uL   Basophils Absolute 0.1  0.0 - 0.1 K/uL   RBC Morphology TARGET CELLS    COMPREHENSIVE METABOLIC PANEL     Status: Abnormal   Collection Time    02/24/13  5:10 PM      Result Value Range   Sodium 137  135 - 145 mEq/L   Potassium 3.8  3.5 - 5.1 mEq/L   Chloride 104  96 - 112 mEq/L   CO2 23  19 - 32 mEq/L   Glucose, Bld 102 (*) 70 - 99 mg/dL   BUN 11  6 - 23 mg/dL   Creatinine, Ser 1.61 (*) 0.50 - 1.10 mg/dL   Calcium 8.7  8.4 - 09.6 mg/dL   Total Protein 7.4  6.0 - 8.3 g/dL   Albumin 3.0 (*) 3.5 - 5.2 g/dL   AST 13  0 - 37 U/L   ALT 13  0 - 35 U/L   Alkaline Phosphatase 83  39 - 117 U/L   Total Bilirubin 0.4  0.3 - 1.2 mg/dL   GFR calc non Af Amer 47  (*) >90 mL/min   GFR calc Af Amer 55 (*) >90 mL/min  LIPASE, BLOOD     Status: None   Collection Time    02/24/13  5:10 PM      Result Value Range   Lipase 36  11 - 59 U/L  APTT     Status: None   Collection Time    02/24/13  5:10 PM      Result Value Range   aPTT 33  24 - 37 seconds  PROTIME-INR     Status: None   Collection Time    02/24/13  5:10 PM      Result Value Range   Prothrombin Time 14.5  11.6 - 15.2 seconds   INR 1.15  0.00 - 1.49  POCT I-STAT TROPONIN I     Status: None   Collection Time    02/24/13  5:18 PM      Result Value Range   Troponin i, poc 0.00  0.00 - 0.08 ng/mL   Comment 3           POCT I-STAT, CHEM 8     Status: Abnormal   Collection Time    02/24/13  5:21 PM      Result Value Range   Sodium 142  135 - 145 mEq/L   Potassium 3.9  3.5 - 5.1 mEq/L   Chloride 108  96 - 112 mEq/L   BUN 11  6 - 23 mg/dL   Creatinine, Ser 0.45 (*) 0.50 - 1.10 mg/dL   Glucose, Bld 409 (*) 70 - 99 mg/dL   Calcium, Ion 8.11  9.14 - 1.30 mmol/L   TCO2 22  0 - 100 mmol/L   Hemoglobin 8.2 (*) 12.0 - 15.0 g/dL   HCT 78.2 (*) 95.6 - 21.3 %  PREPARE RBC (CROSSMATCH)  Status: None   Collection Time    02/24/13  8:00 PM      Result Value Range   Order Confirmation ORDER PROCESSED BY BLOOD BANK    TYPE AND SCREEN     Status: None   Collection Time    02/24/13  9:07 PM      Result Value Range   ABO/RH(D) O POS     Antibody Screen NEG     Sample Expiration 02/27/2013     Unit Number [id number]     Blood Component Type RED CELLS,LR     Unit division 00     Status of Unit ALLOCATED     Transfusion Status OK TO TRANSFUSE     Crossmatch Result Compatible     Unit Number [id number]     Blood Component Type RED CELLS,LR     Unit division 00     Status of Unit ALLOCATED     Transfusion Status OK TO TRANSFUSE     Crossmatch Result Compatible     Unit Number [id number]     Blood Component Type RED CELLS,LR     Unit division 00     Status of Unit ISSUED      Transfusion Status OK TO TRANSFUSE     Crossmatch Result Compatible     Unit Number [id number]     Blood Component Type RBC LR PHER2     Unit division 00     Status of Unit ISSUED,FINAL     Transfusion Status OK TO TRANSFUSE     Crossmatch Result Compatible    MRSA PCR SCREENING     Status: None   Collection Time    02/24/13 10:26 PM      Result Value Range   MRSA by PCR NEGATIVE  NEGATIVE  HEMOGLOBIN     Status: Abnormal   Collection Time    02/24/13 11:00 PM      Result Value Range   Hemoglobin 6.0 (*) 12.0 - 15.0 g/dL  PREPARE RBC (CROSSMATCH)     Status: None   Collection Time    02/24/13 11:00 PM      Result Value Range   Order Confirmation ORDER PROCESSED BY BLOOD BANK    HEMOGLOBIN     Status: Abnormal   Collection Time    02/25/13  6:59 AM      Result Value Range   Hemoglobin 8.1 (*) 12.0 - 15.0 g/dL     Ct Abdomen Pelvis W Contrast  02/24/2013  *RADIOLOGY REPORT*  Clinical Data: Rectal bleeding.  Abdominal pain.  CT ABDOMEN AND PELVIS WITH CONTRAST  Technique:  Multidetector CT imaging of the abdomen and pelvis was performed following the standard protocol during bolus administration of intravenous contrast.  Contrast: OMNIPAQUE IOHEXOL 300 MG/ML  SOLN, 50mL OMNIPAQUE IOHEXOL 300 MG/ML  SOLN  Comparison: 05/12/2011  Findings: Ground-glass opacities and interlobular septal thickening at both lung bases right greater than left are nonspecific.  Small right pleural effusion.  There is an irregular margins suggesting an element of loculation.  Calcified granulomata in the liver.  Sludge in the gallbladder.  Spleen, pancreas, adrenal glands are within normal limits.  Mild chronic changes of the kidneys.  14 mm short axis diameter portal node on image 22.  No retroperitoneal abnormal adenopathy.  Normal appendix.  Bladder, uterus, and adnexa are within normal limits.  Trace free fluid in the pelvis.  Degenerative disc disease in the lumbar spine.  No vertebral compression  deformity.  Stable lytic lesion in the right anterior acetabulum.  The  IMPRESSION: Irregular lung markings at both lung bases.  A inflammatory process is favored.  There is a coexisting right pleural effusion with elements of loculation.  Single mildly enlarged periportal lymph node.  This is nonspecific. Inflammatory and neoplastic etiology are in the differential diagnosis.  Follow-up is suggested.  No obvious bowel pathology.   Original Report Authenticated By: Jolaine Click, M.D.     ROS:  As stated above in the HPI otherwise negative.  Blood pressure 146/70, pulse 49, temperature 98.4 F (36.9 C), temperature source Oral, resp. rate 20, height 5\' 6"  (1.676 m), weight 192 lb 0.3 oz (87.1 kg), SpO2 98.00%.    PE: Gen: NAD, Alert and Oriented HEENT:  [place]/AT, EOMI Neck: Supple, no LAD Lungs: CTA Bilaterally CV: RRR without M/G/R ABM: Soft, NTND, +BS Ext: No C/C/E  Assessment/Plan: 1) Heme positive stool. 2) Anemia.  Plan: 1) I will perform a repeat EGD/Colonoscopy again.  If there are no findings a capsule endoscopy will be pursued.  Vash Quezada D 02/25/2013, 10:42 AM

## 2013-02-26 ENCOUNTER — Inpatient Hospital Stay (HOSPITAL_COMMUNITY): Payer: Medicare Other

## 2013-02-26 ENCOUNTER — Encounter (HOSPITAL_COMMUNITY)
Admission: EM | Disposition: A | Discharge status: Home / Self Care | Payer: Self-pay | Source: Home / Self Care | Attending: Internal Medicine

## 2013-02-26 DIAGNOSIS — D509 Iron deficiency anemia, unspecified: Secondary | ICD-10-CM

## 2013-02-26 DIAGNOSIS — I4891 Unspecified atrial fibrillation: Secondary | ICD-10-CM | POA: Diagnosis not present

## 2013-02-26 DIAGNOSIS — R195 Other fecal abnormalities: Secondary | ICD-10-CM

## 2013-02-26 DIAGNOSIS — K922 Gastrointestinal hemorrhage, unspecified: Secondary | ICD-10-CM | POA: Diagnosis not present

## 2013-02-26 HISTORY — PX: GIVENS CAPSULE STUDY: SHX5432

## 2013-02-26 SURGERY — IMAGING PROCEDURE, GI TRACT, INTRALUMINAL, VIA CAPSULE
Anesthesia: LOCAL

## 2013-02-26 MED ORDER — METOPROLOL TARTRATE 1 MG/ML IV SOLN
5.0000 mg | Freq: Once | INTRAVENOUS | Status: AC
  Administered 2013-02-26: 5 mg via INTRAVENOUS
  Filled 2013-02-26: qty 5

## 2013-02-26 MED ORDER — FUROSEMIDE 10 MG/ML IJ SOLN
40.0000 mg | Freq: Once | INTRAMUSCULAR | Status: AC
  Administered 2013-02-26: 40 mg via INTRAVENOUS
  Filled 2013-02-26: qty 4

## 2013-02-26 MED ORDER — LORATADINE 10 MG PO TABS
10.0000 mg | ORAL_TABLET | Freq: Every day | ORAL | Status: DC
  Administered 2013-02-26 – 2013-02-28 (×3): 10 mg via ORAL
  Filled 2013-02-26 (×3): qty 1

## 2013-02-26 SURGICAL SUPPLY — 1 items: TOWEL COTTON PACK 4EA (MISCELLANEOUS) ×4 IMPLANT

## 2013-02-26 NOTE — Progress Notes (Addendum)
Patient ID: [name], female   DOB: 1951-07-31, 62 y.o.   MRN: [id number] [facility] Progress Note  Subjective: No specific c/o she had capsule endoscopy last night-tolerated well.  No active bleeding.  02 sat low earlier and currently in afib with rate in 130's  Objective:  Vital signs in last 24 hours: Temp:  [98.3 F (36.8 C)-99 F (37.2 C)] 98.7 F (37.1 C) (04/05 0400) Pulse Rate:  [46-123] 88 (04/05 0400) Resp:  [14-25] 17 (04/05 0400) BP: (122-187)/(55-115) 156/78 mmHg (04/05 0800) SpO2:  [83 %-99 %] 96 % (04/05 0400) Weight:  [198 lb 3.1 oz (89.9 kg)] 198 lb 3.1 oz (89.9 kg) (04/05 0400) Last BM Date: 02/25/13 General:   Alert,  Well-developed, AA female    in NAD Heart:  irregular rate and rhythm; no murmurs Pulm;clear Abdomen:  Soft, nontender and nondistended. Normal bowel sounds, without guarding, and without rebound.   Extremities:  Without edema. Neurologic:  Alert and  oriented x4;  grossly normal neurologically. Psych:  Alert and cooperative. Normal mood and affect.  Intake/Output from previous day: 04/04 0701 - 04/05 0700 In: 659.2 [I.V.:659.2] Out: 1300 [Urine:1300] Intake/Output this shift: Total I/O In: 10 [I.V.:10] Out: 350 [Urine:350]  Lab Results:  Recent Labs  02/24/13 1710 02/24/13 1721  02/25/13 1533 02/25/13 2154 02/26/13 0356  WBC 9.2  --   --   --   --   --   HGB 6.1* 8.2*  < > 8.2* 8.9* 8.4*  HCT 21.1* 24.0*  --   --   --   --   PLT 441*  --   --   --   --   --   < > = values in this interval not displayed. BMET  Recent Labs  02/24/13 1710 02/24/13 1721  NA 137 142  K 3.8 3.9  CL 104 108  CO2 23  --   GLUCOSE 102* 103*  BUN 11 11  CREATININE 1.20* 1.20*  CALCIUM 8.7  --    LFT  Recent Labs  02/24/13 1710  PROT 7.4  ALBUMIN 3.0*  AST 13  ALT 13  ALKPHOS 83  BILITOT 0.4   PT/INR  Recent Labs  02/24/13 1710  LABPROT 14.5  INR 1.15   Assessment / Plan: #1 62 yo female with profound  microcytic anemia, heme positive stool-negative EGD and Colonoscopy this admit. Capsule endoscopy has been completed - will be interpreted Monday per Dr. Elnoria Howard #2 Atrial Fib with RVR #3 CHF #4 CRI No further GI recomendations today Dr. Elnoria Howard to return Monday, and read Capsule study  Active Problems:   * No active hospital problems. *   LOS: 2 days   Amy Esterwood [facility] 02/26/2013, 10:12 AM    I have taken an interval history, reviewed the chart and examined the patient. I agree with the Advanced Practitioner's note, impression and recommendations. Awaiting CE study reading on Monday. If she remains stable discharge home with outpatient follow up with Dr. Elnoria Howard would be reasonable  Judie Petit T. Russella Dar MD Clementeen Graham

## 2013-02-26 NOTE — Progress Notes (Signed)
[facility] PROGRESS NOTE  [name] XBJ:[id number] DOB: 03-26-1951 DOA: 02/24/2013 PCP: Joy Plum, MD  Assessment/Plan:  1. GI bleed with severe anemia, patient is on Naprosyn and was on aspirin . Plan for EGD/COLOnoscopy on 4/4. S/p  2 units of packed RBC transfusion and H&H is much improved today. EGD and colonoscopy are normal. Capsule endosocpy done today and results to be interpreted on Monday.  2. Diastolic congestive heart failure : appears to be compensated.  3. Anemia : outpatient follow up with Dr [name] is scheduled. 4. History of hypertension patient had orthostatic hypotension in the ER ; possibly from loss of blood . On IV  FLUIDS.  5. History of chronic kidney disease creatinine is 1.2 : gentle hydration and repeat bMP in am.  6. History of hemorrhoids surgery in the past  7. Periportal lymph node enlargement suggestive of malignant process/ Inflammatory process in the differential. Will be closely monitored.   8. Atrial fibrillation:in rvr today. She will  Be restarted on all her meds today. Her last echo was last year with mild LVH and diastolic dysfunction. She follows up with Dr [name] as outpatient.   9. Cough, hypoxic: will order CXR. Today.  DVT prophylaxis.    Code Status: FULL CODE  Family Communication: none at bedside Disposition Plan: pending.    Consultants:  GI   Procedures: Pending EGD/COLONOSCOPY by Dr Elnoria Howard. HPI/Subjective: No new complaints.  Objective: Filed Vitals:   02/26/13 0400 02/26/13 0800 02/26/13 1200 02/26/13 1437  BP: 148/77 156/78 121/70 115/70  Pulse: 88   113  Temp: 98.7 F (37.1 C)  98.7 F (37.1 C) 97.7 F (36.5 C)  TempSrc: Oral Oral Oral Oral  Resp: 17   18  Height:    5\' 6"  (1.676 m)  Weight: 89.9 kg (198 lb 3.1 oz)   89.9 kg (198 lb 3.1 oz)  SpO2: 96%  98% 99%    Intake/Output Summary (Last 24 hours) at 02/26/13 1643 Last data filed at 02/26/13 1300  Gross per 24 hour  Intake 776.83 ml   Output   1650 ml  Net -873.17 ml   Filed Weights   02/24/13 2230 02/26/13 0400 02/26/13 1437  Weight: 87.1 kg (192 lb 0.3 oz) 89.9 kg (198 lb 3.1 oz) 89.9 kg (198 lb 3.1 oz)    Exam: Alert afebrile comfortable Lungs: Symmetrical Chest wall movement, Good air movement bilaterally, CTAB.  CVS:  RRR, No Gallops, Rubs or Murmurs, No Parasternal Heave.  Abdomen:  Positive Bowel Sounds, Abdomen Soft, Non tender, No organomegaly appriciated,No rebound -guarding or rigidity.   skin:  No Cyanosis, Normal Skin Turgor, No Skin Rash or Bruise.   Data Reviewed: Basic Metabolic Panel:  Recent Labs Lab 02/24/13 1710 02/24/13 1721  NA 137 142  K 3.8 3.9  CL 104 108  CO2 23  --   GLUCOSE 102* 103*  BUN 11 11  CREATININE 1.20* 1.20*  CALCIUM 8.7  --    Liver Function Tests:  Recent Labs Lab 02/24/13 1710  AST 13  ALT 13  ALKPHOS 83  BILITOT 0.4  PROT 7.4  ALBUMIN 3.0*    Recent Labs Lab 02/24/13 1710  LIPASE 36   No results found for this basename: AMMONIA,  in the last 168 hours CBC:  Recent Labs Lab 02/24/13 1710 02/24/13 1721  02/25/13 0659 02/25/13 1533 02/25/13 2154 02/26/13 0356 02/26/13 1027  WBC 9.2  --   --   --   --   --   --   --  NEUTROABS 6.7  --   --   --   --   --   --   --   HGB 6.1* 8.2*  < > 8.1* 8.2* 8.9* 8.4* 9.2*  HCT 21.1* 24.0*  --   --   --   --   --   --   MCV 62.4*  --   --   --   --   --   --   --   PLT 441*  --   --   --   --   --   --   --   < > = values in this interval not displayed. Cardiac Enzymes: No results found for this basename: CKTOTAL, CKMB, CKMBINDEX, TROPONINI,  in the last 168 hours BNP (last 3 results)  Recent Labs  03/04/12 0510 05/24/12 1634 10/01/12 1300  PROBNP 2374.0* 2614.0* 1975.0*   CBG: No results found for this basename: GLUCAP,  in the last 168 hours  Recent Results (from the past 240 hour(s))  MRSA PCR SCREENING     Status: None   Collection Time    02/24/13 10:26 PM      Result Value Range  Status   MRSA by PCR NEGATIVE  NEGATIVE Final   Comment:            The GeneXpert MRSA Assay (FDA     approved for NASAL specimens     only), is one component of a     comprehensive MRSA colonization     surveillance program. It is not     intended to diagnose MRSA     infection nor to guide or     monitor treatment for     MRSA infections.     Studies: Dg Chest 2 View  02/26/2013  *RADIOLOGY REPORT*  Clinical Data: Cough and hypoxia.  CHEST - 2 VIEW  Comparison: 12/18/2012  Findings: Diffuse interstitial prominence in both lungs and a small left pleural effusion are suggestive of interstitial edema.  The heart is mildly enlarged.  The visualized bony structures are unremarkable.  IMPRESSION: Suspect interstitial edema with small left pleural effusion.   Original Report Authenticated By: Irish Lack, M.D.    Ct Abdomen Pelvis W Contrast  02/24/2013  *RADIOLOGY REPORT*  Clinical Data: Rectal bleeding.  Abdominal pain.  CT ABDOMEN AND PELVIS WITH CONTRAST  Technique:  Multidetector CT imaging of the abdomen and pelvis was performed following the standard protocol during bolus administration of intravenous contrast.  Contrast: OMNIPAQUE IOHEXOL 300 MG/ML  SOLN, 50mL OMNIPAQUE IOHEXOL 300 MG/ML  SOLN  Comparison: 05/12/2011  Findings: Ground-glass opacities and interlobular septal thickening at both lung bases right greater than left are nonspecific.  Small right pleural effusion.  There is an irregular margins suggesting an element of loculation.  Calcified granulomata in the liver.  Sludge in the gallbladder.  Spleen, pancreas, adrenal glands are within normal limits.  Mild chronic changes of the kidneys.  14 mm short axis diameter portal node on image 22.  No retroperitoneal abnormal adenopathy.  Normal appendix.  Bladder, uterus, and adnexa are within normal limits.  Trace free fluid in the pelvis.  Degenerative disc disease in the lumbar spine.  No vertebral compression deformity.  Stable  lytic lesion in the right anterior acetabulum.  The  IMPRESSION: Irregular lung markings at both lung bases.  A inflammatory process is favored.  There is a coexisting right pleural effusion with elements of loculation.  Single mildly enlarged periportal  lymph node.  This is nonspecific. Inflammatory and neoplastic etiology are in the differential diagnosis.  Follow-up is suggested.  No obvious bowel pathology.   Original Report Authenticated By: Jolaine Click, M.D.     Scheduled Meds: . amiodarone  200 mg Oral Daily  . citalopram  10 mg Oral Daily  . loratadine  10 mg Oral Daily  . metoprolol tartrate  12.5 mg Oral BID  . pantoprazole  40 mg Oral BID  . sodium chloride  3 mL Intravenous Q12H   Continuous Infusions:    Active Problems:   Nonspecific abnormal finding in stool contents        [facility]  [facility] Pager [phone]. If 7PM-7AM, please contact night-coverage at [email or web address], password [facility] 02/26/2013, 4:43 PM  LOS: 2 days

## 2013-02-27 DIAGNOSIS — K922 Gastrointestinal hemorrhage, unspecified: Secondary | ICD-10-CM | POA: Diagnosis not present

## 2013-02-27 DIAGNOSIS — I4891 Unspecified atrial fibrillation: Secondary | ICD-10-CM | POA: Diagnosis not present

## 2013-02-27 DIAGNOSIS — R195 Other fecal abnormalities: Secondary | ICD-10-CM | POA: Diagnosis not present

## 2013-02-27 DIAGNOSIS — D649 Anemia, unspecified: Secondary | ICD-10-CM | POA: Diagnosis not present

## 2013-02-27 LAB — CBC
HCT: 28.6 % — ABNORMAL LOW (ref 36.0–46.0)
MCH: 20.8 pg — ABNORMAL LOW (ref 26.0–34.0)
MCHC: 30.8 g/dL (ref 30.0–36.0)
MCV: 67.6 fL — ABNORMAL LOW (ref 78.0–100.0)
Platelets: 315 10*3/uL (ref 150–400)
RDW: 24.7 % — ABNORMAL HIGH (ref 11.5–15.5)
WBC: 12 10*3/uL — ABNORMAL HIGH (ref 4.0–10.5)

## 2013-02-27 LAB — BASIC METABOLIC PANEL
BUN: 10 mg/dL (ref 6–23)
CO2: 22 mEq/L (ref 19–32)
Calcium: 8.8 mg/dL (ref 8.4–10.5)
Creatinine, Ser: 1.17 mg/dL — ABNORMAL HIGH (ref 0.50–1.10)

## 2013-02-27 LAB — HEMOGLOBIN: Hemoglobin: 7.8 g/dL — ABNORMAL LOW (ref 12.0–15.0)

## 2013-02-27 MED ORDER — FUROSEMIDE 10 MG/ML IJ SOLN
40.0000 mg | Freq: Once | INTRAMUSCULAR | Status: AC
  Administered 2013-02-27: 40 mg via INTRAVENOUS
  Filled 2013-02-27: qty 4

## 2013-02-27 NOTE — Progress Notes (Signed)
Patient ID: [name], female   DOB: 10-24-1951, 62 y.o.   MRN: [id number] [facility] Progress Note  Subjective: Feels OK, No GI complaints, had a BM this morning- no blood or melena per pt- did not see the capsule. A little SOB when ambulating HGB down to 7.8  Objective:  Vital signs in last 24 hours: Temp:  [97.7 F (36.5 C)-98.9 F (37.2 C)] 98.2 F (36.8 C) (04/06 0515) Pulse Rate:  [58-113] 58 (04/06 0515) Resp:  [18-20] 20 (04/06 0515) BP: (115-138)/(58-70) 138/59 mmHg (04/06 0515) SpO2:  [85 %-99 %] 98 % (04/06 0647) Weight:  [195 lb 1.7 oz (88.5 kg)-198 lb 3.1 oz (89.9 kg)] 195 lb 1.7 oz (88.5 kg) (04/06 0515) Last BM Date: 02/25/13 General:   Alert,  Well-developed, in NAD Heart: ir Regular rate and rhythm; soft murmur Pulm: clear Abdomen:  Soft, nontender and nondistended. Normal bowel sounds, without guarding, and without rebound.   Extremities:  Without edema. Neurologic:  Alert and  oriented x4;  grossly normal neurologically. Psych:  Alert and cooperative. Normal mood and affect.  Intake/Output from previous day: 04/05 0701 - 04/06 0700 In: 1610 [P.O.:1550; I.V.:60] Out: 350 [Urine:350] Intake/Output this shift:    Lab Results:  Recent Labs  02/24/13 1710 02/24/13 1721  02/26/13 0356 02/26/13 1027 02/27/13 0503  WBC 9.2  --   --   --   --   --   HGB 6.1* 8.2*  < > 8.4* 9.2* 7.8*  HCT 21.1* 24.0*  --   --   --   --   PLT 441*  --   --   --   --   --   < > = values in this interval not displayed. BMET  Recent Labs  02/24/13 1710 02/24/13 1721  NA 137 142  K 3.8 3.9  CL 104 108  CO2 23  --   GLUCOSE 102* 103*  BUN 11 11  CREATININE 1.20* 1.20*  CALCIUM 8.7  --    LFT  Recent Labs  02/24/13 1710  PROT 7.4  ALBUMIN 3.0*  AST 13  ALT 13  ALKPHOS 83  BILITOT 0.4   PT/INR  Recent Labs  02/24/13 1710  LABPROT 14.5  INR 1.15    Assessment / Plan: #1  62 yo female with severe microcytic anemia and heme  positive stool- EGD and Colonoscopy unrevealing- Capsule endoscopy completed- will be interpreted by Dr. Elnoria Howard No active bleeding, but Hgb drifting #2 Atrial Fib #3 Schizophrenia #4 CRI  Active Problems:   Nonspecific abnormal finding in stool contents   LOS: 3 days   Amy Esterwood  02/27/2013, 9:53 AM    I have taken an interval history, reviewed the chart and examined the patient. I agree with the Advanced Practitioner's note, impression and recommendations. Hb is drifted down so would monitor at least through tomorrow.  May need another transfusion. CE report pending. Dr. Elnoria Howard will see on Monday.   Venita Lick. Russella Dar MD Clementeen Graham

## 2013-02-27 NOTE — Progress Notes (Signed)
[facility] PROGRESS NOTE  [name]:[id number] DOB: 1951/11/03 DOA: 02/24/2013 PCP: [name], MD  Assessment/Plan:  1. GI bleed with severe anemia, patient is on Naprosyn and was on aspirin . Plan for EGD/COLOnoscopy on 4/4. S/p  2 units of packed RBC transfusion and H&H is much improved today. EGD and colonoscopy are normal. Capsule endosocpy done today and results to be interpreted on Monday.  2.  Acute on chronic Diastolic congestive heart failure : she is hypoxic on room air at rest and on ambulation. Pro bnp ordered, cxr showed interstitial edema. Started on  Iv lasix. I/O last 3 completed shifts: In: 2570 [P.O.:2510; I.V.:60] Out: 350 [Urine:350]   Repeat CXR in am.    3. Anemia : outpatient follow up with [name] is scheduled. 4. History of hypertension patient had orthostatic hypotension in the ER ; possibly from loss of blood . On IV  FLUIDS.  5. History of chronic kidney disease creatinine is 1.2 : gentle hydration and repeat bMP in am.  6. History of hemorrhoids surgery in the past  7. Periportal lymph node enlargement suggestive of malignant process/ Inflammatory process in the differential. Will be closely monitored.   8. Atrial fibrillation: rate controlled. She  restarted on all her meds . Her last echo was last year with mild LVH and diastolic dysfunction. She follows up with [name] as outpatient.     DVT prophylaxis.    Code Status: FULL CODE  Family Communication: none at bedside Disposition Plan: pending.    Consultants:  GI   Procedures: Pending EGD/COLONOSCOPY by Dr Elnoria Howard. HPI/Subjective: No new complaints.  Objective: Filed Vitals:   02/26/13 2046 02/27/13 0515 02/27/13 0647 02/27/13 1436  BP: 115/58 138/59  123/47  Pulse: 60 58  54  Temp: 98.9 F (37.2 C) 98.2 F (36.8 C)  98.2 F (36.8 C)  TempSrc: Oral Oral  Oral  Resp: 18 20  20   Height:      Weight:  88.5 kg (195 lb 1.7 oz)    SpO2: 96% 85% 98% 99%     Intake/Output Summary (Last 24 hours) at 02/27/13 1904 Last data filed at 02/27/13 1700  Gross per 24 hour  Intake   1440 ml  Output      0 ml  Net   1440 ml   Filed Weights   02/26/13 0400 02/26/13 1437 02/27/13 0515  Weight: 89.9 kg (198 lb 3.1 oz) 89.9 kg (198 lb 3.1 oz) 88.5 kg (195 lb 1.7 oz)    Exam: Alert afebrile comfortable Lungs: Symmetrical Chest wall movement, Good air movement bilaterally, CTAB.  CVS:  RRR, No Gallops, Rubs or Murmurs, No Parasternal Heave.  Abdomen:  Positive Bowel Sounds, Abdomen Soft, Non tender, No organomegaly appriciated,No rebound -guarding or rigidity.   skin:  No Cyanosis, Normal Skin Turgor, No Skin Rash or Bruise.   Data Reviewed: Basic Metabolic Panel:  Recent Labs Lab 02/24/13 1710 02/24/13 1721 02/27/13 1220  NA 137 142 137  K 3.8 3.9 3.6  CL 104 108 104  CO2 23  --  22  GLUCOSE 102* 103* 116*  BUN 11 11 10   CREATININE 1.20* 1.20* 1.17*  CALCIUM 8.7  --  8.8   Liver Function Tests:  Recent Labs Lab 02/24/13 1710  AST 13  ALT 13  ALKPHOS 83  BILITOT 0.4  PROT 7.4  ALBUMIN 3.0*    Recent Labs Lab 02/24/13 1710  LIPASE 36   No results found for this basename: AMMONIA,  in the last 168 hours CBC:  Recent Labs Lab 02/24/13 1710 02/24/13 1721  02/25/13 2154 02/26/13 0356 02/26/13 1027 02/27/13 0503 02/27/13 1220  WBC 9.2  --   --   --   --   --   --  12.0*  NEUTROABS 6.7  --   --   --   --   --   --   --   HGB 6.1* 8.2*  < > 8.9* 8.4* 9.2* 7.8* 8.8*  HCT 21.1* 24.0*  --   --   --   --   --  28.6*  MCV 62.4*  --   --   --   --   --   --  67.6*  PLT 441*  --   --   --   --   --   --  315  < > = values in this interval not displayed. Cardiac Enzymes: No results found for this basename: CKTOTAL, CKMB, CKMBINDEX, TROPONINI,  in the last 168 hours BNP (last 3 results)  Recent Labs  05/24/12 1634 10/01/12 1300 02/27/13 1220  PROBNP 2614.0* 1975.0* 1748.0*   CBG: No results found for this  basename: GLUCAP,  in the last 168 hours  Recent Results (from the past 240 hour(s))  MRSA PCR SCREENING     Status: None   Collection Time    02/24/13 10:26 PM      Result Value Range Status   MRSA by PCR NEGATIVE  NEGATIVE Final   Comment:            The GeneXpert MRSA Assay (FDA     approved for NASAL specimens     only), is one component of a     comprehensive MRSA colonization     surveillance program. It is not     intended to diagnose MRSA     infection nor to guide or     monitor treatment for     MRSA infections.     Studies: Dg Chest 2 View  02/26/2013  *RADIOLOGY REPORT*  Clinical Data: Cough and hypoxia.  CHEST - 2 VIEW  Comparison: 12/18/2012  Findings: Diffuse interstitial prominence in both lungs and a small left pleural effusion are suggestive of interstitial edema.  The heart is mildly enlarged.  The visualized bony structures are unremarkable.  IMPRESSION: Suspect interstitial edema with small left pleural effusion.   Original Report Authenticated By: Irish Lack, M.D.     Scheduled Meds: . amiodarone  200 mg Oral Daily  . citalopram  10 mg Oral Daily  . furosemide  40 mg Intravenous Once  . loratadine  10 mg Oral Daily  . metoprolol tartrate  12.5 mg Oral BID  . pantoprazole  40 mg Oral BID  . sodium chloride  3 mL Intravenous Q12H   Continuous Infusions:    Active Problems:   Nonspecific abnormal finding in stool contents        [facility]  [facility] Pager [phone]. If 7PM-7AM, please contact night-coverage at [email or web address], password [facility] 02/27/2013, 7:04 PM  LOS: 3 days

## 2013-02-27 NOTE — Progress Notes (Signed)
Pt ambulated approx186ft with o2 on and sat was 94and hr  was 63 Pt ambulated approx 135ft with out o2 and sat was 85 and hr was 71.

## 2013-02-28 ENCOUNTER — Encounter (HOSPITAL_COMMUNITY): Payer: Self-pay | Admitting: Gastroenterology

## 2013-02-28 ENCOUNTER — Inpatient Hospital Stay (HOSPITAL_COMMUNITY): Payer: Medicare Other

## 2013-02-28 DIAGNOSIS — D649 Anemia, unspecified: Secondary | ICD-10-CM

## 2013-02-28 DIAGNOSIS — I4891 Unspecified atrial fibrillation: Secondary | ICD-10-CM

## 2013-02-28 DIAGNOSIS — K922 Gastrointestinal hemorrhage, unspecified: Secondary | ICD-10-CM

## 2013-02-28 LAB — TYPE AND SCREEN
ABO/RH(D): O POS
Antibody Screen: NEGATIVE
Unit division: 0
Unit division: 0
Unit division: 0
Unit division: 0

## 2013-02-28 LAB — BASIC METABOLIC PANEL
BUN: 11 mg/dL (ref 6–23)
Calcium: 9.3 mg/dL (ref 8.4–10.5)
Creatinine, Ser: 1.17 mg/dL — ABNORMAL HIGH (ref 0.50–1.10)
GFR calc Af Amer: 57 mL/min — ABNORMAL LOW (ref >90)
GFR calc non Af Amer: 49 mL/min — ABNORMAL LOW (ref >90)
Glucose, Bld: 90 mg/dL (ref 70–99)

## 2013-02-28 LAB — CBC
MCH: 20.6 pg — ABNORMAL LOW (ref 26.0–34.0)
MCHC: 30.1 g/dL (ref 30.0–36.0)
RDW: 25.1 % — ABNORMAL HIGH (ref 11.5–15.5)

## 2013-02-28 MED ORDER — METOPROLOL TARTRATE 25 MG PO TABS: 12.5000 mg | ORAL_TABLET | Freq: Every day | ORAL | Status: DC

## 2013-02-28 MED ORDER — FUROSEMIDE 40 MG PO TABS: 20.0000 mg | ORAL_TABLET | Freq: Every day | ORAL | Status: DC

## 2013-02-28 MED ORDER — FUROSEMIDE 40 MG PO TABS
40.0000 mg | ORAL_TABLET | Freq: Every day | ORAL | Status: DC
  Administered 2013-02-28: 40 mg via ORAL
  Filled 2013-02-28: qty 1

## 2013-02-28 MED ORDER — POTASSIUM CHLORIDE CRYS ER 20 MEQ PO TBCR
40.0000 meq | EXTENDED_RELEASE_TABLET | Freq: Two times a day (BID) | ORAL | Status: DC
  Administered 2013-02-28: 40 meq via ORAL
  Filled 2013-02-28 (×2): qty 2

## 2013-02-28 MED ORDER — SODIUM CHLORIDE 0.9 % IV BOLUS (SEPSIS)
250.0000 mL | Freq: Once | INTRAVENOUS | Status: AC
  Administered 2013-02-28: 250 mL via INTRAVENOUS

## 2013-02-28 MED ORDER — PANTOPRAZOLE SODIUM 40 MG PO TBEC: 40.0000 mg | DELAYED_RELEASE_TABLET | Freq: Two times a day (BID) | ORAL | Status: DC

## 2013-02-28 NOTE — Discharge Summary (Signed)
Physician Discharge Summary  [name] JYN:[id number] DOB: [birth year] DOA: 02/24/2013  PCP: [name], MD  Admit date: 02/24/2013 Discharge date: 02/28/2013  Time spent: 55 minutes  Recommendations for Outpatient Follow-up:  1. Follow upw ith Dr Elnoria Howard in 1 week. And referral to [facility] for double balloon enteroscopy 2. Follow up with a CT abd and pelvis for resolution of periportal LN enlargement in 2 to 4 weeks.  3. Follow up with [name] cardiologist in one week. As she had an exacerbation of diastolic CHF during the hospitalization.   Discharge Diagnoses:  Active Problems:   Nonspecific abnormal finding in stool contents   Discharge Condition: fair  Diet recommendation: low salt diet  Filed Weights   02/26/13 1437 02/27/13 0515 02/28/13 0532  Weight: 89.9 kg (198 lb 3.1 oz) 88.5 kg (195 lb 1.7 oz) 86 kg (189 lb 9.5 oz)    History of present illness:   This is a 62 year old female with a PMH of anemia and heme positive stool admitted for DOE and weakness. The admission blood work revealed that she was anemic with an HGB in the 6 range. In the past she has undergone a GI work up by Drs. Elnoria Howard and Tucker with negative findings. An irritated hemorrhoid was identified by Dr. Christella Hartigan during a prior colonoscopy. With her recurrent findings a GI consultation was requested.  Hospital Course:  1. GI bleed with severe anemia, patient is on Naprosyn and was on aspirin . Plan for EGD/COLOnoscopy on 4/4. S/p 2 units of packed RBC transfusion and H&H is much improved. EGD and colonoscopy are normal. 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 Follow up at [facility] with double balloon enteroscopy.    2. Acute on chronic Diastolic congestive heart failure : she is hypoxic on room air at rest and on ambulation after she received the 2 units of PRBC, and IV fluids. cxr showed interstitial edema.  She was  Started on Iv lasix. Her breathing improved and she was ambulated int he hallway and her oxygen sats have remained well over 95%. She was discharged on oral lasix .  I/O last 3 completed shifts:  In: 2570 [P.O.:2510; I.V.:60]  Out: 350 [Urine:350]    3. Anemia : outpatient follow up with [name] is scheduled.  4. History of hypertension patient had orthostatic hypotension in the ER ; possibly from loss of blood . She received IV fluids and bp parameters are normal.  5. History of chronic kidney disease creatinine is 1.2 : at baseline.  6. History of hemorrhoids surgery in the past   7. Periportal lymph node enlargement suggestive of malignant process/ Inflammatory process in the differential. ; recommend outpatient follow up with a CT abd and pelvis in 4 weeks.  8. Atrial fibrillation: rate controlled. She restarted on all her meds . Her last echo was last year with mild LVH and diastolic dysfunction. She follows up with [name] as outpatient.      Consultations:  GI   Discharge Exam: Filed Vitals:   02/28/13 1430 02/28/13 1447 02/28/13 1448 02/28/13 1500  BP: 95/56 89/47 82/66  104/60  Pulse: 49 46  50  Temp: 98.1 F (36.7 C) 98.1 F (36.7 C)  98 F (36.7 C)  TempSrc: Oral Oral  Oral  Resp: 18 19  18   Height:      Weight:      SpO2: 96% 98%  97%     Discharge Instructions  Discharge Orders   Future Appointments Provider Department Dept Phone   03/16/2013 10:30 AM [facility] [phone]   03/16/2013 10:45 AM Delcie Roch [place] [facility] [phone]   03/16/2013 11:00 AM Benjiman Core, MD [place] [phone]   Future Orders Complete By Expires     (HEART FAILURE PATIENTS) Call MD:  Anytime you have any of the following symptoms: 1) 3 pound weight gain in 24 hours or 5 pounds in 1 week 2) shortness of breath, with or without a dry hacking  cough 3) swelling in the hands, feet or stomach 4) if you have to sleep on extra pillows at night in order to breathe.  As directed     Activity as tolerated - No restrictions  As directed     Diet - low sodium heart healthy  As directed     Discharge instructions  As directed     Comments:      FOLLOW UP WITH CARDIOLOGY IN ONE WEEK.  PLEASE OBTAIN BMP TOMORROW TO CHECK POTASSIUM.    Discharge instructions  As directed     Comments:      Follow up with [name] in one week Follow up with Dr Elnoria Howard as recommended.  Please check BMP in 1 to 2 days to check potassium.        Medication List    TAKE these medications       amiodarone 200 MG tablet  Commonly known as:  PACERONE  Take 200 mg by mouth daily.     CALCIUM + D3 PO  Take 1 tablet by mouth daily.     citalopram 10 MG tablet  Commonly known as:  CELEXA  Take 10 mg by mouth daily.     docusate sodium 100 MG capsule  Commonly known as:  COLACE  Take 100 mg by mouth daily.     furosemide 40 MG tablet  Commonly known as:  LASIX  Take 0.5 tablets (20 mg total) by mouth daily.     IRON COMPLEX PO  Take 50 mg by mouth daily.     metoCLOPramide 5 MG tablet  Commonly known as:  REGLAN  Take 5 mg by mouth 4 (four) times daily.     metoprolol tartrate 25 MG tablet  Commonly known as:  LOPRESSOR  Take 0.5 tablets (12.5 mg total) by mouth daily.     pantoprazole 40 MG tablet  Commonly known as:  PROTONIX  Take 1 tablet (40 mg total) by mouth 2 (two) times daily.     potassium chloride SA 20 MEQ tablet  Commonly known as:  K-DUR,KLOR-CON  Take 1 tablet (20 mEq total) by mouth 2 (two) times daily.     pravastatin 20 MG tablet  Commonly known as:  PRAVACHOL  Take 1 tablet (20 mg total) by mouth at bedtime.     temazepam 15 MG capsule  Commonly known as:  RESTORIL  Take 15 mg by mouth at bedtime.     vitamin B-12 1000 MCG tablet  Commonly known as:  CYANOCOBALAMIN  Take 1,000 mcg by mouth daily.            Follow-up Information   Follow up with OSEI-BONSU,GEORGE, MD. Schedule an appointment as soon as possible for a visit in 1 week.   Contact information:   [address], [address] [place]  [place] [address] [phone]        The results of significant diagnostics from this hospitalization (including imaging, microbiology, ancillary and laboratory) are listed below for reference.    Significant Diagnostic Studies: Dg Chest 2 View  02/28/2013  *RADIOLOGY REPORT*  Clinical Data: Hypoxia.  Resolution of pulmonary edema.  CHEST - 2 VIEW  Comparison: Chest x-ray 02/26/2013.  Findings: There is cephalization of the pulmonary vasculature and slight indistinctness of the interstitial markings suggestive of mild pulmonary edema.  Trace bilateral pleural effusions.  Heart size is borderline enlarged. The patient is rotated to the right on today's exam, resulting in distortion of the mediastinal contours and reduced diagnostic sensitivity and specificity for mediastinal pathology.  IMPRESSION: 1.  Persistent mild interstitial pulmonary edema and trace bilateral pleural effusions. 2.  Borderline cardiomegaly.   Original Report Authenticated By: Trudie Reed, M.D.    Dg Chest 2 View  02/26/2013  *RADIOLOGY REPORT*  Clinical Data: Cough and hypoxia.  CHEST - 2 VIEW  Comparison: 12/18/2012  Findings: Diffuse interstitial prominence in both lungs and a small left pleural effusion are suggestive of interstitial edema.  The heart is mildly enlarged.  The visualized bony structures are unremarkable.  IMPRESSION: Suspect interstitial edema with small left pleural effusion.   Original Report Authenticated By: Irish Lack, M.D.    Ct Abdomen Pelvis W Contrast  02/24/2013  *RADIOLOGY REPORT*  Clinical Data: Rectal bleeding.  Abdominal pain.  CT ABDOMEN AND PELVIS WITH CONTRAST  Technique:  Multidetector CT imaging of the abdomen and pelvis was performed following the standard protocol during bolus administration of  intravenous contrast.  Contrast: OMNIPAQUE IOHEXOL 300 MG/ML  SOLN, 50mL OMNIPAQUE IOHEXOL 300 MG/ML  SOLN  Comparison: 05/12/2011  Findings: Ground-glass opacities and interlobular septal thickening at both lung bases right greater than left are nonspecific.  Small right pleural effusion.  There is an irregular margins suggesting an element of loculation.  Calcified granulomata in the liver.  Sludge in the gallbladder.  Spleen, pancreas, adrenal glands are within normal limits.  Mild chronic changes of the kidneys.  14 mm short axis diameter portal node on image 22.  No retroperitoneal abnormal adenopathy.  Normal appendix.  Bladder, uterus, and adnexa are within normal limits.  Trace free fluid in the pelvis.  Degenerative disc disease in the lumbar spine.  No vertebral compression deformity.  Stable lytic lesion in the right anterior acetabulum.  The  IMPRESSION: Irregular lung markings at both lung bases.  A inflammatory process is favored.  There is a coexisting right pleural effusion with elements of loculation.  Single mildly enlarged periportal lymph node.  This is nonspecific. Inflammatory and neoplastic etiology are in the differential diagnosis.  Follow-up is suggested.  No obvious bowel pathology.   Original Report Authenticated By: Jolaine Click, M.D.     Microbiology: Recent Results (from the past 240 hour(s))  MRSA PCR SCREENING     Status: None   Collection Time    02/24/13 10:26 PM      Result Value Range Status   MRSA by PCR NEGATIVE  NEGATIVE Final   Comment:            The GeneXpert MRSA Assay (FDA     approved for NASAL specimens     only), is one component of a     comprehensive MRSA colonization     surveillance program. It is not     intended to diagnose MRSA     infection nor to guide  or     monitor treatment for     MRSA infections.     Labs: Basic Metabolic Panel:  Recent Labs Lab 02/24/13 1710 02/24/13 1721 02/27/13 1220 02/28/13 0452  NA 137 142 137 138   K 3.8 3.9 3.6 3.1*  CL 104 108 104 102  CO2 23  --  22 27  GLUCOSE 102* 103* 116* 90  BUN 11 11 10 11   CREATININE 1.20* 1.20* 1.17* 1.17*  CALCIUM 8.7  --  8.8 9.3   Liver Function Tests:  Recent Labs Lab 02/24/13 1710  AST 13  ALT 13  ALKPHOS 83  BILITOT 0.4  PROT 7.4  ALBUMIN 3.0*    Recent Labs Lab 02/24/13 1710  LIPASE 36   No results found for this basename: AMMONIA,  in the last 168 hours CBC:  Recent Labs Lab 02/24/13 1710 02/24/13 1721  02/26/13 0356 02/26/13 1027 02/27/13 0503 02/27/13 1220 02/28/13 0452  WBC 9.2  --   --   --   --   --  12.0* 11.3*  NEUTROABS 6.7  --   --   --   --   --   --   --   HGB 6.1* 8.2*  < > 8.4* 9.2* 7.8* 8.8* 8.1*  HCT 21.1* 24.0*  --   --   --   --  28.6* 26.9*  MCV 62.4*  --   --   --   --   --  67.6* 68.3*  PLT 441*  --   --   --   --   --  315 411*  < > = values in this interval not displayed. Cardiac Enzymes: No results found for this basename: CKTOTAL, CKMB, CKMBINDEX, TROPONINI,  in the last 168 hours BNP: BNP (last 3 results)  Recent Labs  05/24/12 1634 10/01/12 1300 02/27/13 1220  PROBNP 2614.0* 1975.0* 1748.0*   CBG: No results found for this basename: GLUCAP,  in the last 168 hours     Signed:  Terre Zabriskie  [facility] 02/28/2013, 4:25 PM

## 2013-02-28 NOTE — Care Management Note (Signed)
    Page 1 of 1   02/28/2013     5:19:51 PM   CARE MANAGEMENT NOTE 02/28/2013  Patient:  Amiel Sharrow, WAHID   Account Number:  [id number]  Date Initiated:  02/25/2013  Documentation initiated by:  DAVIS,RHONDA  Subjective/Objective Assessment:   pt with rectal bleeding, per hx, hgb 6.1, transfused     Action/Plan:   home   Anticipated DC Date:  02/28/2013   Anticipated DC Plan:  HOME/SELF CARE         Choice offered to / List presented to:             Status of service:  Completed, signed off Medicare Important Message given?  NA - LOS <3 / Initial given by admissions (If response is "NO", the following Medicare IM given date fields will be blank) Date Medicare IM given:   Date Additional Medicare IM given:    Discharge Disposition:  HOME/SELF CARE  Per UR Regulation:  Reviewed for med. necessity/level of care/duration of stay  If discussed at Long Length of Stay Meetings, dates discussed:    Comments:  02/27/13 [facility] RN,BSN NCM [phone] D/C HOME NO ORDERS OR NEEDS. [id number] Earlene Plater, RN, BSN, CCM:  CHART REVIEWED AND UPDATED.  Next chart review due on [id number]. NO DISCHARGE NEEDS PRESENT AT THIS TIME. CASE MANAGEMENT [phone]

## 2013-02-28 NOTE — Progress Notes (Signed)
Patient ingested  Givins capsule per protocol as ordered per DR P. Hung  .Proticol reviewed with ICU nursing staff.Marland Kitchen

## 2013-02-28 NOTE — Progress Notes (Signed)
O2 Sats at rest OFF O2 96% Post ambulation OFF O2   Sats will drop to 86% but is  not sustain and   will come back up to 95%. Patient no complaints of having shortness of breath or any discomfort during and after ambulation.

## 2013-02-28 NOTE — Progress Notes (Signed)
Discharge to home, family at bedside.D/c instructions and follow up appointment done and given to the patient verbalized understanding. PIV removed no s/s/ of infiltration or swelling, no complaints of any pain or discomfort upon discharged.

## 2013-02-28 NOTE — Progress Notes (Signed)
I read the patient's capsule endoscopy this morning.  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  These lesions will require ablation, but I will make a referral for her, as an outpatient, to [facility] for a Double Balloon Enteroscopy.  I believe this is the source of the recurrent GI bleeding.

## 2013-02-28 NOTE — Evaluation (Signed)
Physical Therapy One Time Evaluation Patient Details Name: [name] MRN: [id number] DOB: 1951-11-03 Today's Date: 02/28/2013 Time: 9147-8295 PT Time Calculation (min): 13 min  PT Assessment / Plan / Recommendation Clinical Impression  Pt admitted for GI bleed with severe anemia and CHF.   Pt mainly independent with mobility however saturations dropped during ambulation requiring oxygen.  Discussed possible need for oxygen upon d/c and pt hopes to d/c without oxygen.  Pt does not present with acute PT needs at this time.    PT Assessment  Patent does not need any further PT services    Follow Up Recommendations  No PT follow up    Does the patient have the potential to tolerate intense rehabilitation      Barriers to Discharge        Equipment Recommendations  None recommended by PT    Recommendations for Other Services     Frequency      Precautions / Restrictions Precautions Precautions: None Restrictions Weight Bearing Restrictions: No   Pertinent Vitals/Pain SATURATION QUALIFICATIONS: (This note is used to comply with regulatory documentation for home oxygen)  Patient Saturations on Room Air at Rest = 98%  Patient Saturations on Room Air while Ambulating = 86%  Patient Saturations on 1 Liters of oxygen while Ambulating = 89-92%  Please briefly explain why patient needs home oxygen: To maintain saturations above 88% during ambulation.   Pt on 2L upon entering and exiting room.     Mobility  Bed Mobility Bed Mobility: Supine to Sit;Sit to Supine Supine to Sit: 7: Independent Sit to Supine: 7: Independent Transfers Transfers: Sit to Stand;Stand to Sit Sit to Stand: 6: Modified independent (Device/Increase time);From bed Stand to Sit: 6: Modified independent (Device/Increase time);To bed Ambulation/Gait Ambulation/Gait Assistance: 5: Supervision;6: Modified independent (Device/Increase time) Ambulation Distance (Feet): 160 Feet (x2) Assistive device:  None Ambulation/Gait Assistance Details: pt ambulated without oxygen however sats dropped to 86% so performed PLB and then ambulated back to room, ambulated again and applied oxygen Gait Pattern: Step-through pattern General Gait Details: no unsteady gait or LOB observed    Exercises     PT Diagnosis:    PT Problem List:   PT Treatment Interventions:     PT Goals    Visit Information  Last PT Received On: 02/28/13 Assistance Needed: +1    Subjective Data  Subjective: There are a lot of sick people here.  I thought I might be discharged yesterday.   Prior Functioning  Home Living Lives With: Daughter Available Help at Discharge: Family Type of Home: House Home Layout: One level Home Adaptive Equipment: None Prior Function Level of Independence: Independent Communication Communication: No difficulties    Cognition  Cognition Overall Cognitive Status: Appears within functional limits for tasks assessed/performed Arousal/Alertness: Awake/alert Orientation Level: Appears intact for tasks assessed Behavior During Session: [facility] for tasks performed    Extremity/Trunk Assessment Right Upper Extremity Assessment RUE ROM/Strength/Tone: [facility] for tasks assessed Left Upper Extremity Assessment LUE ROM/Strength/Tone: [facility] for tasks assessed Right Lower Extremity Assessment RLE ROM/Strength/Tone: [facility] for tasks assessed Left Lower Extremity Assessment LLE ROM/Strength/Tone: [facility] for tasks assessed Trunk Assessment Trunk Assessment: Normal   Balance    End of Session PT - End of Session Equipment Utilized During Treatment: Oxygen Activity Tolerance: Patient tolerated treatment well Patient left: in bed;with call bell/phone within reach Nurse Communication: Mobility status  GP     Cyanne Delmar,KATHrine E 02/28/2013, 11:13 AM Zenovia Jarred, PT, DPT 02/28/2013 Pager: [phone]

## 2013-03-01 ENCOUNTER — Encounter (HOSPITAL_COMMUNITY): Payer: Self-pay | Admitting: Gastroenterology

## 2013-03-11 ENCOUNTER — Other Ambulatory Visit: Payer: Self-pay | Admitting: Oncology

## 2013-03-11 DIAGNOSIS — D509 Iron deficiency anemia, unspecified: Secondary | ICD-10-CM

## 2013-03-16 ENCOUNTER — Other Ambulatory Visit: Payer: Medicare Other | Admitting: Lab

## 2013-03-16 ENCOUNTER — Ambulatory Visit: Payer: Medicare Other | Admitting: Oncology

## 2013-03-16 ENCOUNTER — Ambulatory Visit: Payer: Medicare Other

## 2013-05-03 ENCOUNTER — Telehealth: Payer: Self-pay | Admitting: Oncology

## 2013-05-03 ENCOUNTER — Other Ambulatory Visit: Payer: Self-pay | Admitting: Oncology

## 2013-05-03 DIAGNOSIS — D649 Anemia, unspecified: Secondary | ICD-10-CM

## 2013-05-03 NOTE — Telephone Encounter (Signed)
lm with family member to tell pt about 6.11.14 appt...ok

## 2013-05-04 ENCOUNTER — Ambulatory Visit: Payer: Medicare Other | Admitting: Oncology

## 2013-05-04 ENCOUNTER — Other Ambulatory Visit: Payer: Medicare Other

## 2013-06-23 ENCOUNTER — Other Ambulatory Visit: Payer: Self-pay

## 2013-06-23 ENCOUNTER — Encounter (HOSPITAL_COMMUNITY): Payer: Self-pay | Admitting: Cardiology

## 2013-06-23 ENCOUNTER — Emergency Department (HOSPITAL_COMMUNITY): Payer: Medicare Other

## 2013-06-23 ENCOUNTER — Emergency Department (HOSPITAL_COMMUNITY)
Admission: EM | Admit: 2013-06-23 | Discharge: 2013-06-23 | Disposition: A | Discharge status: Home / Self Care | Payer: Medicare Other | Attending: Emergency Medicine | Admitting: Emergency Medicine

## 2013-06-23 DIAGNOSIS — Z8639 Personal history of other endocrine, nutritional and metabolic disease: Secondary | ICD-10-CM | POA: Insufficient documentation

## 2013-06-23 DIAGNOSIS — E785 Hyperlipidemia, unspecified: Secondary | ICD-10-CM | POA: Insufficient documentation

## 2013-06-23 DIAGNOSIS — I129 Hypertensive chronic kidney disease with stage 1 through stage 4 chronic kidney disease, or unspecified chronic kidney disease: Secondary | ICD-10-CM | POA: Insufficient documentation

## 2013-06-23 DIAGNOSIS — F4321 Adjustment disorder with depressed mood: Secondary | ICD-10-CM | POA: Insufficient documentation

## 2013-06-23 DIAGNOSIS — Z8739 Personal history of other diseases of the musculoskeletal system and connective tissue: Secondary | ICD-10-CM | POA: Insufficient documentation

## 2013-06-23 DIAGNOSIS — F411 Generalized anxiety disorder: Secondary | ICD-10-CM | POA: Insufficient documentation

## 2013-06-23 DIAGNOSIS — Z8679 Personal history of other diseases of the circulatory system: Secondary | ICD-10-CM | POA: Insufficient documentation

## 2013-06-23 DIAGNOSIS — F329 Major depressive disorder, single episode, unspecified: Secondary | ICD-10-CM | POA: Insufficient documentation

## 2013-06-23 DIAGNOSIS — Z79899 Other long term (current) drug therapy: Secondary | ICD-10-CM | POA: Insufficient documentation

## 2013-06-23 DIAGNOSIS — N183 Chronic kidney disease, stage 3 unspecified: Secondary | ICD-10-CM | POA: Insufficient documentation

## 2013-06-23 DIAGNOSIS — K219 Gastro-esophageal reflux disease without esophagitis: Secondary | ICD-10-CM | POA: Insufficient documentation

## 2013-06-23 DIAGNOSIS — F17200 Nicotine dependence, unspecified, uncomplicated: Secondary | ICD-10-CM | POA: Insufficient documentation

## 2013-06-23 DIAGNOSIS — Z862 Personal history of diseases of the blood and blood-forming organs and certain disorders involving the immune mechanism: Secondary | ICD-10-CM | POA: Insufficient documentation

## 2013-06-23 DIAGNOSIS — F3289 Other specified depressive episodes: Secondary | ICD-10-CM | POA: Insufficient documentation

## 2013-06-23 NOTE — ED Notes (Signed)
Pt here with peds pt that is recently deceased. Pt report that she is now having chest pain and tearful at triage. States that she has a heart condition. Pt states that she feels like she can't breathe.

## 2013-06-23 NOTE — ED Provider Notes (Addendum)
CSN: [id number]     Arrival date & time 06/23/13  1051 History     First MD Initiated Contact with Patient 06/23/13 1107     Chief Complaint  Patient presents with  . Chest Pain   (Consider location/radiation/quality/duration/timing/severity/associated sxs/prior Treatment) HPI Comments: [name] is a 62 y.o. Female who became sad and tearful. After learning of the death of her great-grandchild, just prior to arriving. The death was sudden. Her daughter had her check into the emergency department to see if anything was wrong. Patient denies chest pain, weakness, dizziness. There are no other modifying factors.  Patient is a 62 y.o. female presenting with chest pain. The history is provided by the patient.  Chest Pain   Past Medical History  Diagnosis Date  . Hypertension   . Borderline diabetes   . Irregular heart beat   . Hyperlipidemia   . CHF (congestive heart failure)   . CKD (chronic kidney disease) stage 3, GFR 30-59 ml/min   . Dysrhythmia     A-fib  . Anxiety   . Depression   . Shortness of breath     on exertion  . GERD (gastroesophageal reflux disease)   . Headache(784.0)   . Arthritis     knees  . Anemia    Past Surgical History  Procedure Laterality Date  . Hemorrhoid surgery    . Esophagogastroduodenoscopy N/A 02/25/2013    Procedure: ESOPHAGOGASTRODUODENOSCOPY (EGD);  Surgeon: Theda Belfast, MD;  Location: [facility];  Service: Endoscopy;  Laterality: N/A;  . Colonoscopy N/A 02/25/2013    Procedure: COLONOSCOPY;  Surgeon: Theda Belfast, MD;  Location: [facility];  Service: Endoscopy;  Laterality: N/A;  . Givens capsule study N/A 02/26/2013    Procedure: GIVENS CAPSULE STUDY;  Surgeon: Theda Belfast, MD;  Location: [facility];  Service: Endoscopy;  Laterality: N/A;   Family History  Problem Relation Age of Onset  . Kidney disease Mother   . Heart attack Mother   . Kidney disease Father   . Hypertension Mother   . Breast cancer Daughter   .  Heart disease Brother    History  Substance Use Topics  . Smoking status: Current Every Day Smoker -- 0.50 packs/day for 45 years    Types: Cigarettes  . Smokeless tobacco: Current User  . Alcohol Use: No   OB History   Grav Para Term Preterm Abortions TAB SAB Ect Mult Living                 Review of Systems  Cardiovascular: Positive for chest pain.  All other systems reviewed and are negative.    Allergies  Penicillins  Home Medications   Current Outpatient Rx  Name  Route  Sig  Dispense  Refill  . amiodarone (PACERONE) 200 MG tablet   Oral   Take 200 mg by mouth daily.         . Calcium Carb-Cholecalciferol (CALCIUM + D3 PO)   Oral   Take 1 tablet by mouth daily.         . citalopram (CELEXA) 10 MG tablet   Oral   Take 10 mg by mouth daily.         Marland Kitchen docusate sodium (COLACE) 100 MG capsule   Oral   Take 100 mg by mouth daily.          . furosemide (LASIX) 40 MG tablet   Oral   Take 0.5 tablets (20 mg total) by mouth daily.  30 tablet   0   . Iron Combinations (IRON COMPLEX PO)   Oral   Take 50 mg by mouth daily.         . metoCLOPramide (REGLAN) 5 MG tablet   Oral   Take 5 mg by mouth 4 (four) times daily.         . metoprolol tartrate (LOPRESSOR) 25 MG tablet   Oral   Take 0.5 tablets (12.5 mg total) by mouth daily.   30 tablet   0   . pantoprazole (PROTONIX) 40 MG tablet   Oral   Take 1 tablet (40 mg total) by mouth 2 (two) times daily.   60 tablet   0   . potassium chloride SA (K-DUR,KLOR-CON) 20 MEQ tablet   Oral   Take 1 tablet (20 mEq total) by mouth 2 (two) times daily.   60 tablet   0   . pravastatin (PRAVACHOL) 20 MG tablet   Oral   Take 1 tablet (20 mg total) by mouth at bedtime.   30 tablet   1   . temazepam (RESTORIL) 15 MG capsule   Oral   Take 15 mg by mouth at bedtime.          . vitamin B-12 (CYANOCOBALAMIN) 1000 MCG tablet   Oral   Take 1,000 mcg by mouth daily.          BP 159/110  Pulse  101  Resp 20  SpO2 96% Physical Exam  Nursing note and vitals reviewed. Constitutional: She is oriented to person, place, and time. She appears well-developed and well-nourished.  Tearful  HENT:  Head: Normocephalic and atraumatic.  Eyes: Conjunctivae and EOM are normal. Pupils are equal, round, and reactive to light.  Neck: Normal range of motion and phonation normal. Neck supple.  Cardiovascular: Normal rate, regular rhythm and intact distal pulses.   Pulmonary/Chest: Effort normal and breath sounds normal. She exhibits no tenderness.  Abdominal: Soft. She exhibits no distension. There is no tenderness. There is no guarding.  Musculoskeletal: Normal range of motion.  Neurological: She is alert and oriented to person, place, and time. She has normal strength. She exhibits normal muscle tone.  Skin: Skin is warm and dry.  Psychiatric: Her behavior is normal. Judgment and thought content normal.  She appears depressed    ED Course   Procedures (including critical care time)  12:56 PM Reevaluation with update and discussion. After initial assessment and treatment, an updated evaluation reveals she feels better, is less tearful now. Her family members are here with her and are supporting her. Nickey Kloepfer L     Date: 06/23/13  Rate: 121  Rhythm: atrial fibrillation  QRS Axis: normal  PR and QT Intervals: Normal QT  ST/T Wave abnormalities: nonspecific ST/T changes  PR and QRS Conduction Disutrbances:Normal QT  Narrative Interpretation:   Old EKG Reviewed: unchanged- 02/24/13        Dg Chest 2 View  06/23/2013   *RADIOLOGY REPORT*  Clinical Data: Chest pain  CHEST - 2 VIEW  Comparison: 02/28/2013  Findings: Stable mild cardiomegaly.  There is diffuse pulmonary vascular congestion.  Interstitial lung markings are mildly diffusely prominent.  No discrete airspace disease.  Negative for pleural effusion.  There is some thickening of the fissures.  The bones appear osteopenic.  No  acute bony abnormality.  IMPRESSION: Cardiomegaly with pulmonary vascular congestion and probable mild interstitial edema.   Original Report Authenticated By: Britta Mccreedy, M.D.   1. Grief reaction  MDM  Grief reaction without acute medical illness. Doubt metabolic instability, serious bacterial infection or impending vascular collapse; the patient is stable for discharge. Mild elevation of heart rate, and blood pressure, improved with a short period of observation during the ED stay. Doubt metabolic instability, serious bacterial infection or impending vascular collapse; the patient is stable for discharge.  Nursing Notes Reviewed/ Care Coordinated, and agree without changes. Applicable Imaging Reviewed.  Interpretation of Laboratory Data incorporated into ED treatment   Plan: Home Medications-  usual ; Home Treatments and Observation-rest, seek support and community ;  return here if the recommended treatment, does not improve the symptoms; Recommended follow up- PCP, when necessary    Flint Melter, MD 06/23/13 1300  Flint Melter, MD 06/23/13 2134

## 2013-06-28 ENCOUNTER — Observation Stay (HOSPITAL_COMMUNITY)
Admission: EM | Admit: 2013-06-28 | Discharge: 2013-06-29 | Disposition: A | Discharge status: Home / Self Care | Payer: Medicare Other | Attending: Internal Medicine | Admitting: Internal Medicine

## 2013-06-28 ENCOUNTER — Emergency Department (HOSPITAL_COMMUNITY): Payer: Medicare Other

## 2013-06-28 ENCOUNTER — Encounter (HOSPITAL_COMMUNITY): Payer: Self-pay | Admitting: Emergency Medicine

## 2013-06-28 DIAGNOSIS — F329 Major depressive disorder, single episode, unspecified: Secondary | ICD-10-CM

## 2013-06-28 DIAGNOSIS — D509 Iron deficiency anemia, unspecified: Secondary | ICD-10-CM

## 2013-06-28 DIAGNOSIS — I10 Essential (primary) hypertension: Secondary | ICD-10-CM | POA: Diagnosis present

## 2013-06-28 DIAGNOSIS — R079 Chest pain, unspecified: Principal | ICD-10-CM

## 2013-06-28 DIAGNOSIS — Z79899 Other long term (current) drug therapy: Secondary | ICD-10-CM | POA: Insufficient documentation

## 2013-06-28 DIAGNOSIS — N183 Chronic kidney disease, stage 3 unspecified: Secondary | ICD-10-CM

## 2013-06-28 DIAGNOSIS — J189 Pneumonia, unspecified organism: Secondary | ICD-10-CM | POA: Diagnosis not present

## 2013-06-28 DIAGNOSIS — I4892 Unspecified atrial flutter: Secondary | ICD-10-CM | POA: Insufficient documentation

## 2013-06-28 DIAGNOSIS — F17210 Nicotine dependence, cigarettes, uncomplicated: Secondary | ICD-10-CM | POA: Diagnosis present

## 2013-06-28 DIAGNOSIS — I5033 Acute on chronic diastolic (congestive) heart failure: Secondary | ICD-10-CM

## 2013-06-28 DIAGNOSIS — M109 Gout, unspecified: Secondary | ICD-10-CM

## 2013-06-28 DIAGNOSIS — F411 Generalized anxiety disorder: Secondary | ICD-10-CM | POA: Insufficient documentation

## 2013-06-28 DIAGNOSIS — K644 Residual hemorrhoidal skin tags: Secondary | ICD-10-CM

## 2013-06-28 DIAGNOSIS — I4891 Unspecified atrial fibrillation: Secondary | ICD-10-CM | POA: Diagnosis not present

## 2013-06-28 DIAGNOSIS — K219 Gastro-esophageal reflux disease without esophagitis: Secondary | ICD-10-CM

## 2013-06-28 DIAGNOSIS — D72829 Elevated white blood cell count, unspecified: Secondary | ICD-10-CM

## 2013-06-28 DIAGNOSIS — F3289 Other specified depressive episodes: Secondary | ICD-10-CM

## 2013-06-28 DIAGNOSIS — M129 Arthropathy, unspecified: Secondary | ICD-10-CM | POA: Insufficient documentation

## 2013-06-28 DIAGNOSIS — F209 Schizophrenia, unspecified: Secondary | ICD-10-CM

## 2013-06-28 DIAGNOSIS — I129 Hypertensive chronic kidney disease with stage 1 through stage 4 chronic kidney disease, or unspecified chronic kidney disease: Secondary | ICD-10-CM | POA: Insufficient documentation

## 2013-06-28 DIAGNOSIS — G8929 Other chronic pain: Secondary | ICD-10-CM | POA: Insufficient documentation

## 2013-06-28 DIAGNOSIS — R195 Other fecal abnormalities: Secondary | ICD-10-CM

## 2013-06-28 DIAGNOSIS — I5032 Chronic diastolic (congestive) heart failure: Secondary | ICD-10-CM | POA: Diagnosis not present

## 2013-06-28 DIAGNOSIS — I498 Other specified cardiac arrhythmias: Secondary | ICD-10-CM | POA: Insufficient documentation

## 2013-06-28 DIAGNOSIS — Z8679 Personal history of other diseases of the circulatory system: Secondary | ICD-10-CM

## 2013-06-28 DIAGNOSIS — E785 Hyperlipidemia, unspecified: Secondary | ICD-10-CM

## 2013-06-28 DIAGNOSIS — Z9189 Other specified personal risk factors, not elsewhere classified: Secondary | ICD-10-CM

## 2013-06-28 DIAGNOSIS — M25569 Pain in unspecified knee: Secondary | ICD-10-CM | POA: Insufficient documentation

## 2013-06-28 DIAGNOSIS — E669 Obesity, unspecified: Secondary | ICD-10-CM | POA: Insufficient documentation

## 2013-06-28 DIAGNOSIS — N189 Chronic kidney disease, unspecified: Secondary | ICD-10-CM | POA: Insufficient documentation

## 2013-06-28 DIAGNOSIS — F17200 Nicotine dependence, unspecified, uncomplicated: Secondary | ICD-10-CM

## 2013-06-28 LAB — CBC
HCT: 32 % — ABNORMAL LOW (ref 36.0–46.0)
Hemoglobin: 9.6 g/dL — ABNORMAL LOW (ref 12.0–15.0)
Hemoglobin: 9.5 g/dL — ABNORMAL LOW (ref 12.0–15.0)
MCH: 19.4 pg — ABNORMAL LOW (ref 26.0–34.0)
MCHC: 30 g/dL (ref 30.0–36.0)
MCHC: 29.7 g/dL — ABNORMAL LOW (ref 30.0–36.0)
RBC: 4.91 MIL/uL (ref 3.87–5.11)
WBC: 9 10*3/uL (ref 4.0–10.5)

## 2013-06-28 LAB — MRSA PCR SCREENING: MRSA by PCR: NEGATIVE

## 2013-06-28 LAB — BASIC METABOLIC PANEL
GFR calc Af Amer: 49 mL/min — ABNORMAL LOW (ref >90)
GFR calc non Af Amer: 42 mL/min — ABNORMAL LOW (ref >90)
Potassium: 4.3 mEq/L (ref 3.5–5.1)
Sodium: 134 mEq/L — ABNORMAL LOW (ref 135–145)

## 2013-06-28 LAB — URINALYSIS, ROUTINE W REFLEX MICROSCOPIC
Bilirubin Urine: NEGATIVE
Hgb urine dipstick: NEGATIVE
Ketones, ur: NEGATIVE mg/dL
Nitrite: NEGATIVE
Urobilinogen, UA: 0.2 mg/dL (ref 0.0–1.0)
pH: 5 (ref 5.0–8.0)

## 2013-06-28 LAB — HEMOGLOBIN A1C
Hgb A1c MFr Bld: 6.2 % — ABNORMAL HIGH (ref <5.7)
Mean Plasma Glucose: 131 mg/dL — ABNORMAL HIGH (ref <117)

## 2013-06-28 LAB — PRO B NATRIURETIC PEPTIDE: Pro B Natriuretic peptide (BNP): 348.9 pg/mL — ABNORMAL HIGH (ref 0–125)

## 2013-06-28 LAB — POCT I-STAT TROPONIN I: Troponin i, poc: 0 ng/mL (ref 0.00–0.08)

## 2013-06-28 LAB — TROPONIN I
Troponin I: <0.3 ng/mL (ref <0.30)
Troponin I: <0.3 ng/mL (ref <0.30)

## 2013-06-28 MED ORDER — POTASSIUM CHLORIDE CRYS ER 20 MEQ PO TBCR
20.0000 meq | EXTENDED_RELEASE_TABLET | Freq: Two times a day (BID) | ORAL | Status: DC
  Administered 2013-06-28 – 2013-06-29 (×2): 20 meq via ORAL
  Filled 2013-06-28 (×3): qty 1

## 2013-06-28 MED ORDER — NITROGLYCERIN 0.4 MG SL SUBL: 0.4000 mg | SUBLINGUAL_TABLET | SUBLINGUAL | Status: DC | PRN

## 2013-06-28 MED ORDER — PANTOPRAZOLE SODIUM 40 MG PO TBEC
40.0000 mg | DELAYED_RELEASE_TABLET | Freq: Two times a day (BID) | ORAL | Status: DC
  Administered 2013-06-28 – 2013-06-29 (×2): 40 mg via ORAL
  Filled 2013-06-28 (×3): qty 1

## 2013-06-28 MED ORDER — AZITHROMYCIN 500 MG PO TABS
500.0000 mg | ORAL_TABLET | Freq: Every day | ORAL | Status: DC
  Administered 2013-06-28 – 2013-06-29 (×2): 500 mg via ORAL
  Filled 2013-06-28 (×2): qty 1

## 2013-06-28 MED ORDER — PANTOPRAZOLE SODIUM 40 MG IV SOLR
40.0000 mg | Freq: Once | INTRAVENOUS | Status: AC
  Administered 2013-06-28: 40 mg via INTRAVENOUS
  Filled 2013-06-28: qty 40

## 2013-06-28 MED ORDER — METOPROLOL TARTRATE 12.5 MG HALF TABLET
12.5000 mg | ORAL_TABLET | Freq: Two times a day (BID) | ORAL | Status: DC
  Administered 2013-06-29: 12.5 mg via ORAL
  Filled 2013-06-28 (×2): qty 1

## 2013-06-28 MED ORDER — ASPIRIN 325 MG PO TABS
325.0000 mg | ORAL_TABLET | ORAL | Status: AC
  Administered 2013-06-28: 325 mg via ORAL
  Filled 2013-06-28: qty 1

## 2013-06-28 MED ORDER — DIPHENHYDRAMINE HCL 50 MG/ML IJ SOLN
25.0000 mg | Freq: Four times a day (QID) | INTRAMUSCULAR | Status: DC
  Administered 2013-06-28 – 2013-06-29 (×2): 25 mg via INTRAVENOUS
  Filled 2013-06-28 (×2): qty 1
  Filled 2013-06-28: qty 0.5
  Filled 2013-06-28: qty 1
  Filled 2013-06-28 (×3): qty 0.5

## 2013-06-28 MED ORDER — CAPSICUM OLEORESIN 0.025 % EX CREA
TOPICAL_CREAM | Freq: Two times a day (BID) | CUTANEOUS | Status: DC
  Administered 2013-06-29: 10:00:00 via TOPICAL
  Filled 2013-06-28: qty 56.6

## 2013-06-28 MED ORDER — FERROUS SULFATE 325 (65 FE) MG PO TABS
325.0000 mg | ORAL_TABLET | Freq: Every day | ORAL | Status: DC
Start: 2013-06-29 — End: 2013-06-29
  Administered 2013-06-29: 325 mg via ORAL
  Filled 2013-06-28 (×3): qty 1

## 2013-06-28 MED ORDER — ALUM & MAG HYDROXIDE-SIMETH 200-200-20 MG/5ML PO SUSP: 30.0000 mL | ORAL | Status: DC | PRN

## 2013-06-28 MED ORDER — ENOXAPARIN SODIUM 40 MG/0.4ML ~~LOC~~ SOLN
40.0000 mg | SUBCUTANEOUS | Status: DC
  Administered 2013-06-28: 40 mg via SUBCUTANEOUS
  Filled 2013-06-28 (×2): qty 0.4

## 2013-06-28 MED ORDER — TEMAZEPAM 15 MG PO CAPS
15.0000 mg | ORAL_CAPSULE | Freq: Every day | ORAL | Status: DC
  Administered 2013-06-28: 15 mg via ORAL
  Filled 2013-06-28: qty 1

## 2013-06-28 MED ORDER — HYDRALAZINE HCL 20 MG/ML IJ SOLN: 10.0000 mg | INTRAMUSCULAR | Status: DC | PRN

## 2013-06-28 MED ORDER — DOCUSATE SODIUM 100 MG PO CAPS
100.0000 mg | ORAL_CAPSULE | Freq: Every day | ORAL | Status: DC
Start: 2013-06-28 — End: 2013-06-29
  Administered 2013-06-29: 100 mg via ORAL
  Filled 2013-06-28 (×2): qty 1

## 2013-06-28 MED ORDER — SODIUM CHLORIDE 0.9 % IJ SOLN
3.0000 mL | Freq: Two times a day (BID) | INTRAMUSCULAR | Status: DC
  Administered 2013-06-28 – 2013-06-29 (×2): 3 mL via INTRAVENOUS

## 2013-06-28 MED ORDER — AMLODIPINE BESYLATE 5 MG PO TABS
5.0000 mg | ORAL_TABLET | Freq: Every day | ORAL | Status: DC
  Administered 2013-06-28 – 2013-06-29 (×2): 5 mg via ORAL
  Filled 2013-06-28 (×2): qty 1

## 2013-06-28 MED ORDER — BUSPIRONE HCL 15 MG PO TABS
7.5000 mg | ORAL_TABLET | Freq: Two times a day (BID) | ORAL | Status: DC
  Administered 2013-06-28 – 2013-06-29 (×2): 7.5 mg via ORAL
  Filled 2013-06-28 (×3): qty 1

## 2013-06-28 MED ORDER — CITALOPRAM HYDROBROMIDE 10 MG PO TABS
10.0000 mg | ORAL_TABLET | Freq: Every day | ORAL | Status: DC
  Administered 2013-06-29: 10 mg via ORAL
  Filled 2013-06-28: qty 1

## 2013-06-28 MED ORDER — AMIODARONE HCL 200 MG PO TABS
200.0000 mg | ORAL_TABLET | Freq: Every day | ORAL | Status: DC
  Administered 2013-06-29: 200 mg via ORAL
  Filled 2013-06-28: qty 1

## 2013-06-28 MED ORDER — LEVOFLOXACIN IN D5W 750 MG/150ML IV SOLN
750.0000 mg | Freq: Once | INTRAVENOUS | Status: AC
  Administered 2013-06-28: 750 mg via INTRAVENOUS
  Filled 2013-06-28: qty 150

## 2013-06-28 MED ORDER — NICOTINE 7 MG/24HR TD PT24
7.0000 mg | MEDICATED_PATCH | TRANSDERMAL | Status: DC
  Administered 2013-06-28: 7 mg via TRANSDERMAL
  Filled 2013-06-28 (×2): qty 1

## 2013-06-28 MED ORDER — VITAMIN B-12 1000 MCG PO TABS
1000.0000 ug | ORAL_TABLET | Freq: Every day | ORAL | Status: DC
  Administered 2013-06-29: 1000 ug via ORAL
  Filled 2013-06-28: qty 1

## 2013-06-28 MED ORDER — METOPROLOL TARTRATE 12.5 MG HALF TABLET: 12.5000 mg | ORAL_TABLET | Freq: Every day | ORAL | Status: DC

## 2013-06-28 MED ORDER — FUROSEMIDE 20 MG PO TABS
20.0000 mg | ORAL_TABLET | Freq: Every day | ORAL | Status: DC
  Administered 2013-06-29: 20 mg via ORAL
  Filled 2013-06-28: qty 1

## 2013-06-28 NOTE — H&P (Addendum)
[facility] History and Physical  MARLET KORTE ZOX:[id number] DOB: 19-Aug-1951 DOA: 06/28/2013  Referring physician:  Pricilla Loveless PCP:  [name], MD   Chief Complaint:  Chest pain  HPI:  The patient is a 62 y.o. year-old female with history of HTN, HLD, chronic diastolic CHF with EF 55-60%, mod MR, atrial fibrillation, GERD, GI tract AVM with recurrent GIB, CKD stage 3, anxiety and depression who presents with chest pain.  The patient was last at their baseline health until 4 day ago.  Three nights ago, she awoke with sharp 10/10 lower sternal chest pain associated with mild headache but without increased SOB, nausea, clamminess.  The pain lasted for several hours and improved when she sat up and worsened when she lay flat.  The pain has recurred over the next few nights in similar fashion, however, last night, she also felt somewhat sick to her stomach.  Denies lightheadedness, palpitation, orthopnea, PND, lower extremity edema.  She has had increased cough with white sputum production, but no wheezing or increased DOE.  Denies fevers and chills.  She has had increased stress in her life due to a death in the family 2 days ago.    In the ER, labs were notable for chronic anemia with baseline hgb 9.6, negative troponin.  CXR with possible mild pulmonary edema vs. Right lower lobe PNA.  Given levofloxacin for CAP in the ER.    Review of Systems:  Denies fevers, chills, weight loss or gain, changes to hearing and vision.  Chronic rhinorrhea, sinus congestion, sore throat.  Denies palpitations. Denies vomiting, constipation, diarrhea.  Denies dysuria, frequency, urgency, polyuria, polydipsia.  Denies hematemesis, blood in stools, melena, abnormal bruising or bleeding.  Denies lymphadenopathy.  Chronic knee arthralgias and myalgias.  Denies skin rash or ulcer.  Denies lower extremity edema.  Denies focal numbness, weakness, slurred speech, confusion, facial droop.  Endorses anxiety  and depression.    Past Medical History  Diagnosis Date  . Hypertension   . Borderline diabetes   . Irregular heart beat   . Hyperlipidemia   . CHF (congestive heart failure)   . CKD (chronic kidney disease) stage 3, GFR 30-59 ml/min   . Dysrhythmia     A-fib  . Anxiety   . Depression   . Shortness of breath     on exertion  . GERD (gastroesophageal reflux disease)   . Headache(784.0)   . Arthritis     knees  . Anemia    Past Surgical History  Procedure Laterality Date  . Hemorrhoid surgery    . Esophagogastroduodenoscopy N/A 02/25/2013    Procedure: ESOPHAGOGASTRODUODENOSCOPY (EGD);  Surgeon: Theda Belfast, MD;  Location: [facility];  Service: Endoscopy;  Laterality: N/A;  . Colonoscopy N/A 02/25/2013    Procedure: COLONOSCOPY;  Surgeon: Theda Belfast, MD;  Location: [facility];  Service: Endoscopy;  Laterality: N/A;  . Givens capsule study N/A 02/26/2013    Procedure: GIVENS CAPSULE STUDY;  Surgeon: Theda Belfast, MD;  Location: [facility];  Service: Endoscopy;  Laterality: N/A;   Social History:  reports that she has been smoking Cigarettes.  She has a 22.5 pack-year smoking history. She uses smokeless tobacco. She reports that she uses illicit drugs (Marijuana). She reports that she does not drink alcohol.  Divorced.  Lives alone.  Independent of ADLs, daughter helps with house management.  Allergies  Allergen Reactions  . Levaquin (Levofloxacin) Hives  . Penicillins Swelling    Family History  Problem Relation Age  of Onset  . Kidney disease Mother   . Heart attack Mother   . Kidney disease Father   . Hypertension Mother   . Breast cancer Daughter   . Heart disease Brother      Prior to Admission medications   Medication Sig Start Date End Date Taking? Authorizing Provider  amiodarone (PACERONE) 200 MG tablet Take 200 mg by mouth daily.   Yes Historical Provider, MD  Calcium Carb-Cholecalciferol (CALCIUM + D3 PO) Take 1 tablet by mouth daily.   Yes  Historical Provider, MD  citalopram (CELEXA) 10 MG tablet Take 10 mg by mouth daily.   Yes Historical Provider, MD  docusate sodium (COLACE) 100 MG capsule Take 100 mg by mouth daily.    Yes Historical Provider, MD  furosemide (LASIX) 40 MG tablet Take 0.5 tablets (20 mg total) by mouth daily. 02/28/13  Yes Kathlen Mody, MD  Iron Combinations (IRON COMPLEX PO) Take 50 mg by mouth daily.   Yes Historical Provider, MD  metoprolol tartrate (LOPRESSOR) 25 MG tablet Take 0.5 tablets (12.5 mg total) by mouth daily. 02/28/13  Yes Kathlen Mody, MD  pantoprazole (PROTONIX) 40 MG tablet Take 1 tablet (40 mg total) by mouth 2 (two) times daily. 02/28/13  Yes Kathlen Mody, MD  potassium chloride SA (K-DUR,KLOR-CON) 20 MEQ tablet Take 1 tablet (20 mEq total) by mouth 2 (two) times daily. 05/26/12  Yes Richarda Overlie, MD  temazepam (RESTORIL) 15 MG capsule Take 15 mg by mouth at bedtime.    Yes Historical Provider, MD  vitamin B-12 (CYANOCOBALAMIN) 1000 MCG tablet Take 1,000 mcg by mouth daily.   Yes Historical Provider, MD   Physical Exam: Filed Vitals:   06/28/13 1419 06/28/13 1702  BP: 124/66 188/71  Pulse: 43 45  Temp: 98.5 F (36.9 C) 98.1 F (36.7 C)  TempSrc: Oral   Resp: 20   Height:  5\' 6"  (1.676 m)  Weight:  82.101 kg (181 lb)  SpO2: 97% 100%     General:  AAF, no acute distress   Eyes:  PERRL, anicteric, non-injected.  ENT:  Nares clear.  OP clear, non-erythematous without plaques or exudates.  MMM.  Neck:  Supple without TM or JVD.    Lymph:  No cervical, supraclavicular, or submandibular LAD.  Cardiovascular:  RRR, normal S1, S2, without m/r/g.  2+ pulses, warm extremities  Respiratory:  Rales at the lateral right base with wheezes or rhonchi, no increased WOB.  Abdomen:  NABS.  Soft, ND/NT.    Skin:  No rashes or focal lesions.  Musculoskeletal:  Normal bulk and tone.  No LE edema.  Psychiatric:  A & O x 4.  Appropriate affect.  Neurologic:  CN 3-12 intact.  5/5 strength.   Sensation intact.  Labs on Admission:  Basic Metabolic Panel:  Recent Labs Lab 06/28/13 1426 06/28/13 1740  NA 134*  --   K 4.3  --   CL 101  --   CO2 20  --   GLUCOSE 87  --   BUN 20  --   CREATININE 1.31* 1.33*  CALCIUM 9.7  --    Liver Function Tests: No results found for this basename: AST, ALT, ALKPHOS, BILITOT, PROT, ALBUMIN,  in the last 168 hours No results found for this basename: LIPASE, AMYLASE,  in the last 168 hours No results found for this basename: AMMONIA,  in the last 168 hours CBC:  Recent Labs Lab 06/28/13 1426 06/28/13 1740  WBC 9.0 9.3  HGB 9.6* 9.5*  HCT 32.0* 32.0*  MCV 65.2* 65.4*  PLT 407* 372   Cardiac Enzymes:  Recent Labs Lab 06/28/13 1426 06/28/13 1740  TROPONINI <0.30 <0.30    BNP (last 3 results)  Recent Labs  10/01/12 1300 02/27/13 1220 06/28/13 1426  PROBNP 1975.0* 1748.0* 348.9*   CBG: No results found for this basename: GLUCAP,  in the last 168 hours  Radiological Exams on Admission: Dg Chest Port 1 View  06/28/2013   *RADIOLOGY REPORT*  Clinical Data: Chest pain, irregular heart beat, hypertension, smoker, history hyperlipidemia, CHF, chronic kidney disease  PORTABLE CHEST - 1 VIEW  Comparison: Portable exam 1433 hours compared to 06/23/2013  Findings: Enlargement of cardiac silhouette with pulmonary vascular congestion. Minimal perihilar and slightly more prominent right basilar infiltrates favor edema though difficult to completely exclude infection at right base. No pleural effusion or pneumothorax. Bones unremarkable.  IMPRESSION: Question mild pulmonary edema/failure though cannot completely exclude coexistent consolidation in right lower lobe.   Original Report Authenticated By: Ulyses Southward, M.D.    Assessment/Plan Principal Problem:   Chest pain Active Problems:   TOBACCO USER   HYPERTENSION   Community acquired pneumonia   Atrial fibrillation   Microcytic anemia   Schizophrenia   CKD (chronic kidney  disease) stage 3, GFR 30-59 ml/min   Chronic diastolic heart failure   GERD (gastroesophageal reflux disease)   Chest pain:  Differential includes ACS in this patient with risk factors of cigarette dependence, obesity, hypertension, and hyperlipidemia, however, she has PNA on CXR and on exam plus her symptoms may be related to anxiety and GERD.  No evidence of PNA, PTX on CXR.   -  Admit to telemetry -  Cycle troponins -  A1c -  Lipid panel -  Daily aspirin -  Continue beta blocker -  NTG prn chest pain -  Maalox prn -  Continue BID PPI -  Follow up with Dr. Jacinto Halim as outpatient within one week  CAP -   Allergy to levofloxacin with hives  -  Changed to azithromycin   Drug allergy -  Started benadryl -  May add steroids if needed  Active Problems:   TOBACCO USER, counseled cessation and give nicotine patch   HYPERTENSION, continued home medications and added prn hydralazine and norvasc.   Atrial fibrillation and flutter, continued amio and metoprolol with hold parameter for bradycardia   Sinus bradycardia, asymptomatic.  On metop and amio despite bradycardia because of intermittent atrial flutter   Microcytic anemia/iron deficiency anemia, stable to improved hgb.     Anxiety:  Started buspar and continue celexa   CKD (chronic kidney disease) stage 3, GFR 30-59 ml/min.  Renally dose medications and minimize nephrotoxins   Chronic diastolic heart failure, stable.  BNP low and no evidence of congestion on CXR   GERD (gastroesophageal reflux disease), may be contributing to chest pain as above.     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

## 2013-06-28 NOTE — Progress Notes (Signed)
Patient declines activation of MyChart-does not have a computer

## 2013-06-28 NOTE — ED Provider Notes (Signed)
CSN: [id number]     Arrival date & time 06/28/13  1406 History     First MD Initiated Contact with Patient 06/28/13 1415     Chief Complaint  Patient presents with  . Chest Pain   (Consider location/radiation/quality/duration/timing/severity/associated sxs/prior Treatment) Patient is a 62 y.o. female presenting with chest pain. The history is provided by the patient.  Chest Pain Pain location:  Substernal area Pain quality: dull   Pain radiates to:  Does not radiate Pain radiates to the back: no   Pain severity:  Moderate Onset quality:  Gradual Duration:  3 days Chronicity:  New Exacerbated by: going to sleep. Associated symptoms: cough and shortness of breath   Associated symptoms: no abdominal pain, no dizziness, no fatigue, no fever, no nausea and not vomiting     Past Medical History  Diagnosis Date  . Hypertension   . Borderline diabetes   . Irregular heart beat   . Hyperlipidemia   . CHF (congestive heart failure)   . CKD (chronic kidney disease) stage 3, GFR 30-59 ml/min   . Dysrhythmia     A-fib  . Anxiety   . Depression   . Shortness of breath     on exertion  . GERD (gastroesophageal reflux disease)   . Headache(784.0)   . Arthritis     knees  . Anemia    Past Surgical History  Procedure Laterality Date  . Hemorrhoid surgery    . Esophagogastroduodenoscopy N/A 02/25/2013    Procedure: ESOPHAGOGASTRODUODENOSCOPY (EGD);  Surgeon: Theda Belfast, MD;  Location: [facility];  Service: Endoscopy;  Laterality: N/A;  . Colonoscopy N/A 02/25/2013    Procedure: COLONOSCOPY;  Surgeon: Theda Belfast, MD;  Location: [facility];  Service: Endoscopy;  Laterality: N/A;  . Givens capsule study N/A 02/26/2013    Procedure: GIVENS CAPSULE STUDY;  Surgeon: Theda Belfast, MD;  Location: [facility];  Service: Endoscopy;  Laterality: N/A;   Family History  Problem Relation Age of Onset  . Kidney disease Mother   . Heart attack Mother   . Kidney disease Father   .  Hypertension Mother   . Breast cancer Daughter   . Heart disease Brother    History  Substance Use Topics  . Smoking status: Current Every Day Smoker -- 0.50 packs/day for 45 years    Types: Cigarettes  . Smokeless tobacco: Current User  . Alcohol Use: No   OB History   Grav Para Term Preterm Abortions TAB SAB Ect Mult Living                 Review of Systems  Constitutional: Negative for fever and fatigue.  Respiratory: Positive for cough and shortness of breath.   Cardiovascular: Positive for chest pain. Negative for leg swelling.  Gastrointestinal: Negative for nausea, vomiting and abdominal pain.  Neurological: Negative for dizziness.  All other systems reviewed and are negative.    Allergies  Penicillins  Home Medications   Current Outpatient Rx  Name  Route  Sig  Dispense  Refill  . amiodarone (PACERONE) 200 MG tablet   Oral   Take 200 mg by mouth daily.         . Calcium Carb-Cholecalciferol (CALCIUM + D3 PO)   Oral   Take 1 tablet by mouth daily.         . citalopram (CELEXA) 10 MG tablet   Oral   Take 10 mg by mouth daily.         Marland Kitchen  docusate sodium (COLACE) 100 MG capsule   Oral   Take 100 mg by mouth daily.          . furosemide (LASIX) 40 MG tablet   Oral   Take 0.5 tablets (20 mg total) by mouth daily.   30 tablet   0   . Iron Combinations (IRON COMPLEX PO)   Oral   Take 50 mg by mouth daily.         . metoCLOPramide (REGLAN) 5 MG tablet   Oral   Take 5 mg by mouth 4 (four) times daily.         . metoprolol tartrate (LOPRESSOR) 25 MG tablet   Oral   Take 0.5 tablets (12.5 mg total) by mouth daily.   30 tablet   0   . pantoprazole (PROTONIX) 40 MG tablet   Oral   Take 1 tablet (40 mg total) by mouth 2 (two) times daily.   60 tablet   0   . potassium chloride SA (K-DUR,KLOR-CON) 20 MEQ tablet   Oral   Take 1 tablet (20 mEq total) by mouth 2 (two) times daily.   60 tablet   0   . pravastatin (PRAVACHOL) 20 MG  tablet   Oral   Take 1 tablet (20 mg total) by mouth at bedtime.   30 tablet   1   . temazepam (RESTORIL) 15 MG capsule   Oral   Take 15 mg by mouth at bedtime.          . vitamin B-12 (CYANOCOBALAMIN) 1000 MCG tablet   Oral   Take 1,000 mcg by mouth daily.          BP 124/66  Pulse 43  Temp(Src) 98.5 F (36.9 C) (Oral)  Resp 20  SpO2 97% Physical Exam  Vitals reviewed. Constitutional: She is oriented to person, place, and time. She appears well-developed and well-nourished.  HENT:  Head: Normocephalic and atraumatic.  Right Ear: External ear normal.  Left Ear: External ear normal.  Nose: Nose normal.  Eyes: Right eye exhibits no discharge. Left eye exhibits no discharge.  Cardiovascular: Regular rhythm, normal heart sounds and intact distal pulses.  Bradycardia present.   Pulmonary/Chest: Effort normal. She has rales. She exhibits no tenderness.  Abdominal: Soft. There is no tenderness.  Musculoskeletal: She exhibits no edema.  Neurological: She is alert and oriented to person, place, and time.  Skin: Skin is warm and dry.    ED Course   Procedures (including critical care time)  Labs Reviewed  BASIC METABOLIC PANEL - Abnormal; Notable for the following:    Sodium 134 (*)    Creatinine, Ser 1.31 (*)    GFR calc non Af Amer 42 (*)    GFR calc Af Amer 49 (*)    All other components within normal limits  CBC - Abnormal; Notable for the following:    Hemoglobin 9.6 (*)    HCT 32.0 (*)    MCV 65.2 (*)    MCH 19.6 (*)    RDW 20.2 (*)    Platelets 407 (*)    All other components within normal limits  PRO B NATRIURETIC PEPTIDE - Abnormal; Notable for the following:    Pro B Natriuretic peptide (BNP) 348.9 (*)    All other components within normal limits  TROPONIN I  PRO B NATRIURETIC PEPTIDE  POCT I-STAT TROPONIN I    Date: 06/28/2013  Rate: 40  Rhythm: sinus bradycardia  QRS Axis: normal  Intervals: QT  prolonged  ST/T Wave abnormalities: normal   Conduction Disutrbances:none  Narrative Interpretation: New Bradycardia compared to prior  Old EKG Reviewed: changes noted   Dg Chest Port 1 View  06/28/2013   *RADIOLOGY REPORT*  Clinical Data: Chest pain, irregular heart beat, hypertension, smoker, history hyperlipidemia, CHF, chronic kidney disease  PORTABLE CHEST - 1 VIEW  Comparison: Portable exam 1433 hours compared to 06/23/2013  Findings: Enlargement of cardiac silhouette with pulmonary vascular congestion. Minimal perihilar and slightly more prominent right basilar infiltrates favor edema though difficult to completely exclude infection at right base. No pleural effusion or pneumothorax. Bones unremarkable.  IMPRESSION: Question mild pulmonary edema/failure though cannot completely exclude coexistent consolidation in right lower lobe.   Original Report Authenticated By: Ulyses Southward, M.D.   1. Community acquired pneumonia   2. Chest pain     MDM  62 yo female with atypical chest pain over past few days with exertional dyspnea. Has productive cough as well. Afebrile, but CXR c/w PNA. Is stable, but with her comorbidities and chest pain feel she needs obs with serial troponins, telemetry and IV abx.     Audree Camel, MD 06/28/13 [phone]

## 2013-06-28 NOTE — ED Notes (Signed)
Pt reports 5/10 centralized chest pain that increases to 10/10 at night while attempting to sleep. Pt also reports shortness of breath, intermittent dizziness, and nausea. Pt reports being a smoker and having a history of hypertension. Pt is A/O x4, NAD.

## 2013-06-28 NOTE — Progress Notes (Signed)
Pt reports itching at IV site and up her arm, redness noted to hand and up arm; stopped levaquin and started saline; hives noted to hand and slightly up arm; MD aware

## 2013-06-28 NOTE — ED Notes (Signed)
Admitting MD at bedside. Pt awaiting transfer to floor.

## 2013-06-29 DIAGNOSIS — J189 Pneumonia, unspecified organism: Secondary | ICD-10-CM | POA: Diagnosis not present

## 2013-06-29 DIAGNOSIS — I509 Heart failure, unspecified: Secondary | ICD-10-CM

## 2013-06-29 DIAGNOSIS — I5033 Acute on chronic diastolic (congestive) heart failure: Secondary | ICD-10-CM

## 2013-06-29 DIAGNOSIS — I4891 Unspecified atrial fibrillation: Secondary | ICD-10-CM | POA: Diagnosis not present

## 2013-06-29 LAB — CBC
HCT: 31.6 % — ABNORMAL LOW (ref 36.0–46.0)
Hemoglobin: 9.4 g/dL — ABNORMAL LOW (ref 12.0–15.0)
MCH: 19.5 pg — ABNORMAL LOW (ref 26.0–34.0)
MCHC: 29.7 g/dL — ABNORMAL LOW (ref 30.0–36.0)
RDW: 20.3 % — ABNORMAL HIGH (ref 11.5–15.5)

## 2013-06-29 LAB — BASIC METABOLIC PANEL
BUN: 18 mg/dL (ref 6–23)
Chloride: 105 mEq/L (ref 96–112)
Creatinine, Ser: 1.29 mg/dL — ABNORMAL HIGH (ref 0.50–1.10)
GFR calc Af Amer: 50 mL/min — ABNORMAL LOW (ref >90)
Glucose, Bld: 90 mg/dL (ref 70–99)

## 2013-06-29 LAB — TROPONIN I
Troponin I: <0.3 ng/mL (ref <0.30)
Troponin I: <0.3 ng/mL (ref <0.30)

## 2013-06-29 MED ORDER — AMLODIPINE BESYLATE 5 MG PO TABS: 5.0000 mg | ORAL_TABLET | Freq: Every day | ORAL | Status: DC

## 2013-06-29 MED ORDER — BUSPIRONE HCL 7.5 MG PO TABS: 7.5000 mg | ORAL_TABLET | Freq: Two times a day (BID) | ORAL | Status: DC

## 2013-06-29 MED ORDER — AZITHROMYCIN 250 MG PO TABS: 250.0000 mg | ORAL_TABLET | Freq: Every day | ORAL | Status: DC

## 2013-06-29 NOTE — Care Management Note (Signed)
    Page 1 of 1   06/29/2013     12:11:52 PM   CARE MANAGEMENT NOTE 06/29/2013  Patient:  [name], [name]   Account Number:  [id number]  Date Initiated:  06/29/2013  Documentation initiated by:  Lanier Clam  Subjective/Objective Assessment:   ADMITTED W/CHEST PAIN.HX:CHF.     Action/Plan:   FROM HOME ALONE.HAS SUPPORT.HAS PCP,PHARMACY.   Anticipated DC Date:  06/29/2013   Anticipated DC Plan:  HOME/SELF CARE      DC Planning Services  CM consult      Choice offered to / List presented to:             Status of service:  Completed, signed off Medicare Important Message given?   (If response is "NO", the following Medicare IM given date fields will be blank) Date Medicare IM given:   Date Additional Medicare IM given:    Discharge Disposition:  HOME/SELF CARE  Per UR Regulation:  Reviewed for med. necessity/level of care/duration of stay  If discussed at Long Length of Stay Meetings, dates discussed:    Comments:  06/29/13 Karri Kallenbach RN,BSN NCM [phone] D/C HOME NO ORDERS OR NEEDS.

## 2013-06-29 NOTE — Discharge Summary (Signed)
Physician Discharge Summary  TIFINI REEDER JWJ:[id number] DOB: October 06, 1951 DOA: 06/28/2013  PCP: [name], MD  Admit date: 06/28/2013 Discharge date: 06/29/2013  Recommendations for Outpatient Follow-up:  1. Pt will need to follow up with PCP in 2-3 weeks post discharge 2. Please obtain BMP to evaluate electrolytes and kidney function 3. Please also check CBC to evaluate Hg and Hct levels 4. Please note that pt was discharged on ZIthromax 250 mg tablet to complete therapy for 7 more days post discharge   Discharge Diagnoses:  Principal Problem:   Chest pain Active Problems:   TOBACCO USER   HYPERTENSION   Community acquired pneumonia   Atrial fibrillation   Microcytic anemia   Schizophrenia   CKD (chronic kidney disease) stage 3, GFR 30-59 ml/min   Chronic diastolic heart failure   GERD (gastroesophageal reflux disease)  Discharge Condition: Stable  Diet recommendation: Heart healthy diet discussed in details   HPI: Patient is a 62 y.o. year-old female with history of HTN, HLD, chronic diastolic CHF with EF 55-60%, mod MR, atrial fibrillation, GERD, GI tract AVM with recurrent GIB, CKD stage 3, anxiety and depression who presented to [facility] ED with chest pain. The patient was last at her baseline health until 4 day prior to this admission when she awoke with sharp 10/10 lower sternal chest pain associated with mild headache but without increased SOB, nausea, clamminess. The pain lasted for several hours and improved when she sat up and worsened when she laid flat. The pain has recurred over the next few nights in similar fashion. Denies lightheadedness, palpitation, orthopnea, PND, lower extremity edema. She has had increased cough with white sputum production, but no wheezing or increased DOE. Denied fevers and chills.   In the ER, labs were notable for chronic anemia with baseline hgb 9.6, negative troponin. CXR with possible mild pulmonary edema vs. Right lower lobe PNA. Given  levofloxacin for CAP in the ER.   Hospital course by problem: Chest pain - possibly related to PNA, ACS ruled out - 3 sets of troponins were within normal limits - pt denies chest pain this AM - TSH is within normal limit - recommended follow up with cardiologist in 1-2 weeks post discharge  CAP  - pt has responded well to Zithromax - she was discharged on Zithromax 250 mg PO QD for 7 more days post discharge   Active Problems:  TOBACCO USER, counseled cessation and gave nicotine patch  HYPERTENSION, continued home medications and added prn hydralazine  Atrial fibrillation and flutter, continued amio and metoprolol Sinus bradycardia, asymptomatic. On metop and amio despite bradycardia because of intermittent atrial flutter  Microcytic anemia/iron deficiency anemia CKD (chronic kidney disease) stage 3, GFR 30-59 ml/min.  Chronic diastolic heart failure, stable. BNP low and no evidence of congestion on CXR  GERD (gastroesophageal reflux disease), may be contributing to chest pain, continue Protonix   Discharge Exam: Filed Vitals:   06/29/13 0936  BP:   Pulse: 115  Temp:   Resp:    Filed Vitals:   06/28/13 2119 06/29/13 0603 06/29/13 0810 06/29/13 0936  BP: 126/57 127/44    Pulse: 48 41  115  Temp: 98.7 F (37.1 C) 98.2 F (36.8 C)    TempSrc: Oral Oral    Resp: 18 16    Height:      Weight:   82.691 kg (182 lb 4.8 oz)   SpO2: 100% 96%      General: Pt is alert, follows commands appropriately, not in  acute distress Cardiovascular: Regular rate and rhythm, S1/S2 +, no murmurs, no rubs, no gallops Respiratory: Clear to auscultation bilaterally, no wheezing, no crackles, no rhonchi Abdominal: Soft, non tender, non distended, bowel sounds +, no guarding Extremities: no edema, no cyanosis, pulses palpable bilaterally DP and PT Neuro: Grossly nonfocal  Discharge Instructions  Discharge Orders   Future Orders Complete By Expires     Diet - low sodium heart healthy  As  directed     Increase activity slowly  As directed         Medication List         amiodarone 200 MG tablet  Commonly known as:  PACERONE  Take 200 mg by mouth daily.     amLODipine 5 MG tablet  Commonly known as:  NORVASC  Take 1 tablet (5 mg total) by mouth daily.     azithromycin 250 MG tablet  Commonly known as:  ZITHROMAX  Take 1 tablet (250 mg total) by mouth daily.     busPIRone 7.5 MG tablet  Commonly known as:  BUSPAR  Take 1 tablet (7.5 mg total) by mouth 2 (two) times daily.     CALCIUM + D3 PO  Take 1 tablet by mouth daily.     citalopram 10 MG tablet  Commonly known as:  CELEXA  Take 10 mg by mouth daily.     docusate sodium 100 MG capsule  Commonly known as:  COLACE  Take 100 mg by mouth daily.     furosemide 40 MG tablet  Commonly known as:  LASIX  Take 0.5 tablets (20 mg total) by mouth daily.     IRON COMPLEX PO  Take 50 mg by mouth daily.     metoprolol tartrate 25 MG tablet  Commonly known as:  LOPRESSOR  Take 0.5 tablets (12.5 mg total) by mouth daily.     pantoprazole 40 MG tablet  Commonly known as:  PROTONIX  Take 1 tablet (40 mg total) by mouth 2 (two) times daily.     potassium chloride SA 20 MEQ tablet  Commonly known as:  K-DUR,KLOR-CON  Take 1 tablet (20 mEq total) by mouth 2 (two) times daily.     temazepam 15 MG capsule  Commonly known as:  RESTORIL  Take 15 mg by mouth at bedtime.     vitamin B-12 1000 MCG tablet  Commonly known as:  CYANOCOBALAMIN  Take 1,000 mcg by mouth daily.           Follow-up Information   Follow up with OSEI-BONSU,GEORGE, MD In 2 weeks.   Contact information:   [address], [address] [place] [address] [phone]       Follow up with Debbora Presto, MD. (As needed if symptoms worsen)    Contact information:   [address] [place] [address] [phone]       The results of significant diagnostics from this hospitalization (including imaging,  microbiology, ancillary and laboratory) are listed below for reference.     Microbiology: Recent Results (from the past 240 hour(s))  MRSA PCR SCREENING     Status: None   Collection Time    06/28/13  5:00 PM      Result Value Range Status   MRSA by PCR NEGATIVE  NEGATIVE Final   Comment:            The GeneXpert MRSA Assay (FDA     approved for NASAL specimens     only), is  one component of a     comprehensive MRSA colonization     surveillance program. It is not     intended to diagnose MRSA     infection nor to guide or     monitor treatment for     MRSA infections.     Labs: Basic Metabolic Panel:  Recent Labs Lab 06/28/13 1426 06/28/13 1740 06/29/13 0505  NA 134*  --  138  K 4.3  --  4.3  CL 101  --  105  CO2 20  --  22  GLUCOSE 87  --  90  BUN 20  --  18  CREATININE 1.31* 1.33* 1.29*  CALCIUM 9.7  --  9.2   CBC:  Recent Labs Lab 06/28/13 1426 06/28/13 1740 06/29/13 0505  WBC 9.0 9.3 7.7  HGB 9.6* 9.5* 9.4*  HCT 32.0* 32.0* 31.6*  MCV 65.2* 65.4* 65.6*  PLT 407* 372 377   Cardiac Enzymes:  Recent Labs Lab 06/28/13 1426 06/28/13 1740 06/29/13 0020 06/29/13 0505  TROPONINI <0.30 <0.30 <0.30 <0.30   BNP: BNP (last 3 results)  Recent Labs  10/01/12 1300 02/27/13 1220 06/28/13 1426  PROBNP 1975.0* 1748.0* 348.9*   SIGNED: Time coordinating discharge: Over 30 minutes  Debbora Presto, MD  [facility] 06/29/2013, 11:53 AM Pager [phone]  If 7PM-7AM, please contact night-coverage [email or web address] Password [email or web address]

## 2013-06-29 NOTE — Progress Notes (Signed)
Pt states that she is missing her blue tee shirt and tan sports bra. Pt stated that she was admitted from ED. ED called and sated they would look for patients belongs. Odeal Welden A

## 2013-11-25 ENCOUNTER — Encounter (HOSPITAL_COMMUNITY): Payer: Self-pay | Admitting: Emergency Medicine

## 2013-11-25 ENCOUNTER — Inpatient Hospital Stay (HOSPITAL_COMMUNITY)
Admission: EM | Admit: 2013-11-25 | Discharge: 2013-11-28 | DRG: 292 | Disposition: A | Discharge status: Home / Self Care | Payer: Medicare Other | Attending: Cardiology | Admitting: Cardiology

## 2013-11-25 ENCOUNTER — Emergency Department (HOSPITAL_COMMUNITY): Payer: Medicare Other

## 2013-11-25 DIAGNOSIS — K219 Gastro-esophageal reflux disease without esophagitis: Secondary | ICD-10-CM | POA: Diagnosis present

## 2013-11-25 DIAGNOSIS — R7309 Other abnormal glucose: Secondary | ICD-10-CM | POA: Diagnosis present

## 2013-11-25 DIAGNOSIS — N183 Chronic kidney disease, stage 3 unspecified: Secondary | ICD-10-CM | POA: Diagnosis present

## 2013-11-25 DIAGNOSIS — I5033 Acute on chronic diastolic (congestive) heart failure: Principal | ICD-10-CM | POA: Diagnosis present

## 2013-11-25 DIAGNOSIS — I495 Sick sinus syndrome: Secondary | ICD-10-CM | POA: Diagnosis present

## 2013-11-25 DIAGNOSIS — F329 Major depressive disorder, single episode, unspecified: Secondary | ICD-10-CM | POA: Diagnosis present

## 2013-11-25 DIAGNOSIS — F1210 Cannabis abuse, uncomplicated: Secondary | ICD-10-CM | POA: Diagnosis present

## 2013-11-25 DIAGNOSIS — N179 Acute kidney failure, unspecified: Secondary | ICD-10-CM | POA: Diagnosis not present

## 2013-11-25 DIAGNOSIS — I4891 Unspecified atrial fibrillation: Secondary | ICD-10-CM | POA: Diagnosis present

## 2013-11-25 DIAGNOSIS — I214 Non-ST elevation (NSTEMI) myocardial infarction: Secondary | ICD-10-CM

## 2013-11-25 DIAGNOSIS — E785 Hyperlipidemia, unspecified: Secondary | ICD-10-CM | POA: Diagnosis present

## 2013-11-25 DIAGNOSIS — M1710 Unilateral primary osteoarthritis, unspecified knee: Secondary | ICD-10-CM | POA: Diagnosis present

## 2013-11-25 DIAGNOSIS — Z880 Allergy status to penicillin: Secondary | ICD-10-CM

## 2013-11-25 DIAGNOSIS — J4489 Other specified chronic obstructive pulmonary disease: Secondary | ICD-10-CM | POA: Diagnosis present

## 2013-11-25 DIAGNOSIS — Z79899 Other long term (current) drug therapy: Secondary | ICD-10-CM

## 2013-11-25 DIAGNOSIS — Z881 Allergy status to other antibiotic agents status: Secondary | ICD-10-CM

## 2013-11-25 DIAGNOSIS — Z841 Family history of disorders of kidney and ureter: Secondary | ICD-10-CM

## 2013-11-25 DIAGNOSIS — F17200 Nicotine dependence, unspecified, uncomplicated: Secondary | ICD-10-CM | POA: Diagnosis present

## 2013-11-25 DIAGNOSIS — I509 Heart failure, unspecified: Secondary | ICD-10-CM | POA: Diagnosis present

## 2013-11-25 DIAGNOSIS — J449 Chronic obstructive pulmonary disease, unspecified: Secondary | ICD-10-CM | POA: Diagnosis present

## 2013-11-25 DIAGNOSIS — I129 Hypertensive chronic kidney disease with stage 1 through stage 4 chronic kidney disease, or unspecified chronic kidney disease: Secondary | ICD-10-CM | POA: Diagnosis present

## 2013-11-25 DIAGNOSIS — I4892 Unspecified atrial flutter: Secondary | ICD-10-CM | POA: Diagnosis present

## 2013-11-25 DIAGNOSIS — Z803 Family history of malignant neoplasm of breast: Secondary | ICD-10-CM

## 2013-11-25 DIAGNOSIS — F411 Generalized anxiety disorder: Secondary | ICD-10-CM | POA: Diagnosis present

## 2013-11-25 DIAGNOSIS — IMO0002 Reserved for concepts with insufficient information to code with codable children: Secondary | ICD-10-CM

## 2013-11-25 DIAGNOSIS — Z8249 Family history of ischemic heart disease and other diseases of the circulatory system: Secondary | ICD-10-CM

## 2013-11-25 DIAGNOSIS — F3289 Other specified depressive episodes: Secondary | ICD-10-CM | POA: Diagnosis present

## 2013-11-25 LAB — CBC WITH DIFFERENTIAL/PLATELET
BASOS PCT: 0 % (ref 0–1)
Basophils Absolute: 0 10*3/uL (ref 0.0–0.1)
EOS PCT: 0 % (ref 0–5)
Eosinophils Absolute: 0 10*3/uL (ref 0.0–0.7)
HCT: 39.1 % (ref 36.0–46.0)
HEMOGLOBIN: 11.6 g/dL — AB (ref 12.0–15.0)
LYMPHS PCT: 3 % — AB (ref 12–46)
Lymphs Abs: 0.6 10*3/uL — ABNORMAL LOW (ref 0.7–4.0)
MCH: 22.7 pg — ABNORMAL LOW (ref 26.0–34.0)
MCHC: 29.7 g/dL — AB (ref 30.0–36.0)
MCV: 76.4 fL — ABNORMAL LOW (ref 78.0–100.0)
MONOS PCT: 1 % — AB (ref 3–12)
Monocytes Absolute: 0.2 10*3/uL (ref 0.1–1.0)
NEUTROS PCT: 96 % — AB (ref 43–77)
Neutro Abs: 17.6 10*3/uL — ABNORMAL HIGH (ref 1.7–7.7)
Platelets: 461 10*3/uL — ABNORMAL HIGH (ref 150–400)
RBC: 5.12 MIL/uL — AB (ref 3.87–5.11)
RDW: 25.8 % — ABNORMAL HIGH (ref 11.5–15.5)
WBC: 18.4 10*3/uL — ABNORMAL HIGH (ref 4.0–10.5)

## 2013-11-25 LAB — PRO B NATRIURETIC PEPTIDE: Pro B Natriuretic peptide (BNP): 1702 pg/mL — ABNORMAL HIGH (ref 0–125)

## 2013-11-25 LAB — BASIC METABOLIC PANEL
BUN: 28 mg/dL — ABNORMAL HIGH (ref 6–23)
CO2: 18 mEq/L — ABNORMAL LOW (ref 19–32)
Calcium: 9.4 mg/dL (ref 8.4–10.5)
Chloride: 104 mEq/L (ref 96–112)
Creatinine, Ser: 1.28 mg/dL — ABNORMAL HIGH (ref 0.50–1.10)
GFR calc Af Amer: 50 mL/min — ABNORMAL LOW (ref >90)
GFR, EST NON AFRICAN AMERICAN: 44 mL/min — AB (ref >90)
GLUCOSE: 156 mg/dL — AB (ref 70–99)
POTASSIUM: 4.7 meq/L (ref 3.7–5.3)
SODIUM: 138 meq/L (ref 137–147)

## 2013-11-25 LAB — POCT I-STAT TROPONIN I: Troponin i, poc: 0.11 ng/mL (ref 0.00–0.08)

## 2013-11-25 MED ORDER — AZITHROMYCIN 250 MG PO TABS
500.0000 mg | ORAL_TABLET | Freq: Every day | ORAL | Status: AC
  Administered 2013-11-25: 500 mg via ORAL
  Filled 2013-11-25: qty 2

## 2013-11-25 MED ORDER — ASPIRIN 81 MG PO CHEW
324.0000 mg | CHEWABLE_TABLET | Freq: Once | ORAL | Status: AC
  Administered 2013-11-25: 324 mg via ORAL
  Filled 2013-11-25: qty 4

## 2013-11-25 MED ORDER — LOSARTAN POTASSIUM 25 MG PO TABS
25.0000 mg | ORAL_TABLET | Freq: Every day | ORAL | Status: DC
  Administered 2013-11-25 – 2013-11-27 (×3): 25 mg via ORAL
  Filled 2013-11-25 (×3): qty 1

## 2013-11-25 MED ORDER — GUAIFENESIN 100 MG/5ML PO SOLN
200.0000 mg | ORAL | Status: DC | PRN
  Filled 2013-11-25: qty 10

## 2013-11-25 MED ORDER — ACETAMINOPHEN 325 MG PO TABS
650.0000 mg | ORAL_TABLET | ORAL | Status: DC | PRN
  Administered 2013-11-26: 650 mg via ORAL
  Filled 2013-11-25: qty 2

## 2013-11-25 MED ORDER — BUSPIRONE HCL 15 MG PO TABS
7.5000 mg | ORAL_TABLET | Freq: Two times a day (BID) | ORAL | Status: DC
  Administered 2013-11-25 – 2013-11-27 (×3): 7.5 mg via ORAL
  Filled 2013-11-25 (×7): qty 1

## 2013-11-25 MED ORDER — AMIODARONE HCL 200 MG PO TABS
200.0000 mg | ORAL_TABLET | Freq: Every day | ORAL | Status: DC
  Administered 2013-11-26 – 2013-11-28 (×3): 200 mg via ORAL
  Filled 2013-11-25 (×3): qty 1

## 2013-11-25 MED ORDER — METOPROLOL TARTRATE 12.5 MG HALF TABLET
12.5000 mg | ORAL_TABLET | Freq: Two times a day (BID) | ORAL | Status: DC
  Administered 2013-11-25 – 2013-11-26 (×2): 12.5 mg via ORAL
  Filled 2013-11-25 (×3): qty 1

## 2013-11-25 MED ORDER — AZITHROMYCIN 250 MG PO TABS
250.0000 mg | ORAL_TABLET | Freq: Every day | ORAL | Status: DC
  Administered 2013-11-26 – 2013-11-28 (×3): 250 mg via ORAL
  Filled 2013-11-25 (×3): qty 1

## 2013-11-25 MED ORDER — SODIUM CHLORIDE 0.9 % IV SOLN: 250.0000 mL | INTRAVENOUS | Status: DC | PRN

## 2013-11-25 MED ORDER — FUROSEMIDE 10 MG/ML IJ SOLN: 40.0000 mg | Freq: Two times a day (BID) | INTRAMUSCULAR | Status: DC

## 2013-11-25 MED ORDER — FUROSEMIDE 10 MG/ML IJ SOLN
40.0000 mg | Freq: Two times a day (BID) | INTRAMUSCULAR | Status: DC
  Administered 2013-11-26 – 2013-11-27 (×3): 40 mg via INTRAVENOUS
  Filled 2013-11-25 (×5): qty 4

## 2013-11-25 MED ORDER — FUROSEMIDE 10 MG/ML IJ SOLN
80.0000 mg | Freq: Once | INTRAMUSCULAR | Status: AC
  Administered 2013-11-25: 80 mg via INTRAVENOUS
  Filled 2013-11-25: qty 8

## 2013-11-25 MED ORDER — CITALOPRAM HYDROBROMIDE 10 MG PO TABS
10.0000 mg | ORAL_TABLET | Freq: Every day | ORAL | Status: DC
  Administered 2013-11-26 – 2013-11-28 (×3): 10 mg via ORAL
  Filled 2013-11-25 (×3): qty 1

## 2013-11-25 MED ORDER — SODIUM CHLORIDE 0.9 % IJ SOLN
3.0000 mL | INTRAMUSCULAR | Status: DC | PRN
Start: 2013-11-25 — End: 2013-11-28

## 2013-11-25 MED ORDER — ZOLPIDEM TARTRATE 5 MG PO TABS: 5.0000 mg | ORAL_TABLET | Freq: Every evening | ORAL | Status: DC | PRN

## 2013-11-25 MED ORDER — METOPROLOL TARTRATE 1 MG/ML IV SOLN: 5.0000 mg | INTRAVENOUS | Status: DC | PRN

## 2013-11-25 MED ORDER — PANTOPRAZOLE SODIUM 40 MG PO TBEC
40.0000 mg | DELAYED_RELEASE_TABLET | Freq: Two times a day (BID) | ORAL | Status: DC
Start: 2013-11-25 — End: 2013-11-28
  Administered 2013-11-25 – 2013-11-28 (×6): 40 mg via ORAL
  Filled 2013-11-25 (×6): qty 1

## 2013-11-25 MED ORDER — METOPROLOL TARTRATE 12.5 MG HALF TABLET: 12.5000 mg | ORAL_TABLET | Freq: Every day | ORAL | Status: DC

## 2013-11-25 MED ORDER — TEMAZEPAM 15 MG PO CAPS
15.0000 mg | ORAL_CAPSULE | Freq: Every day | ORAL | Status: DC
  Administered 2013-11-25 – 2013-11-28 (×3): 15 mg via ORAL
  Filled 2013-11-25 (×3): qty 1

## 2013-11-25 MED ORDER — SODIUM CHLORIDE 0.9 % IJ SOLN
3.0000 mL | Freq: Two times a day (BID) | INTRAMUSCULAR | Status: DC
  Administered 2013-11-25 – 2013-11-27 (×5): 3 mL via INTRAVENOUS

## 2013-11-25 MED ORDER — HEPARIN SODIUM (PORCINE) 5000 UNIT/ML IJ SOLN
5000.0000 [IU] | Freq: Three times a day (TID) | INTRAMUSCULAR | Status: DC
  Administered 2013-11-26 – 2013-11-28 (×8): 5000 [IU] via SUBCUTANEOUS
  Filled 2013-11-25 (×12): qty 1

## 2013-11-25 MED ORDER — POTASSIUM CHLORIDE CRYS ER 20 MEQ PO TBCR
20.0000 meq | EXTENDED_RELEASE_TABLET | Freq: Two times a day (BID) | ORAL | Status: DC
  Administered 2013-11-25 – 2013-11-27 (×4): 20 meq via ORAL
  Filled 2013-11-25 (×5): qty 1

## 2013-11-25 MED ORDER — VITAMIN B-12 1000 MCG PO TABS
1000.0000 ug | ORAL_TABLET | Freq: Every day | ORAL | Status: DC
  Administered 2013-11-26 – 2013-11-28 (×3): 1000 ug via ORAL
  Filled 2013-11-25 (×3): qty 1

## 2013-11-25 MED ORDER — ONDANSETRON HCL 4 MG/2ML IJ SOLN: 4.0000 mg | Freq: Four times a day (QID) | INTRAMUSCULAR | Status: DC | PRN

## 2013-11-25 MED ORDER — DOCUSATE SODIUM 100 MG PO CAPS
100.0000 mg | ORAL_CAPSULE | Freq: Every day | ORAL | Status: DC | PRN
  Filled 2013-11-25: qty 1

## 2013-11-25 NOTE — ED Notes (Signed)
Pt denies chest pain at present, will reattempt EKG in the acute treatment room per protocal

## 2013-11-25 NOTE — ED Provider Notes (Signed)
CSN: [id number]     Arrival date & time 11/25/13  1432 History   First MD Initiated Contact with Patient 11/25/13 [phone]     Chief Complaint  Patient presents with  . Cough    productive cough  . Chest Pain    intermittent chest pain yesterday after eating  . Shortness of Breath    x 10 days    HPI  PCP is Dr. Greggory Stallion" Greggory Stallion [place]Bonsu).  Cardiologist is Dr. Nadara Eaton for a history of Atrial Fibrillation.  Patient describes to me illness. Started with primarily a cough. This progressed to shortness of breath. Seen by primary care physician on Monday. Was given a prescription for Zithromax and unknown cough suppressant. She is worsened. She had this last 2 days arrest and orthopnea for the last 2 nights. Presents here. Yesterday she had a large meal and has some discomfort in her chest afterward. Described as a pressure lasting most of the evening. It was gone when she waking this morning. She's not had a discomfort today has had worsening shortness of breath presents here. No fever. She states she hears a "rattling sound" in her chest when she breathes Motrin the day and at night.  Has history of paroxysmal atrial fibrillation. His not currently anticoagulated because she has a history of recurrent GI bleeds and intestinal arteriovenous malformations. She was previously on Coumadin. No aspirin. That was discontinued both of these about one year ago. She does take Lasix as listed medication. She is not aware of a history of congestive heart failure. She denies a history of MI.  Past Medical History  Diagnosis Date  . Hypertension   . Borderline diabetes   . Irregular heart beat   . Hyperlipidemia   . CHF (congestive heart failure)   . CKD (chronic kidney disease) stage 3, GFR 30-59 ml/min   . Dysrhythmia     A-fib  . Anxiety   . Depression   . Shortness of breath     on exertion  . GERD (gastroesophageal reflux disease)   . Headache(784.0)   . Arthritis     knees  . Anemia    Past  Surgical History  Procedure Laterality Date  . Hemorrhoid surgery    . Esophagogastroduodenoscopy N/A 02/25/2013    Procedure: ESOPHAGOGASTRODUODENOSCOPY (EGD);  Surgeon: Theda Belfast, MD;  Location: [facility];  Service: Endoscopy;  Laterality: N/A;  . Colonoscopy N/A 02/25/2013    Procedure: COLONOSCOPY;  Surgeon: Theda Belfast, MD;  Location: [facility];  Service: Endoscopy;  Laterality: N/A;  . Givens capsule study N/A 02/26/2013    Procedure: GIVENS CAPSULE STUDY;  Surgeon: Theda Belfast, MD;  Location: [facility];  Service: Endoscopy;  Laterality: N/A;   Family History  Problem Relation Age of Onset  . Kidney disease Mother   . Heart attack Mother   . Kidney disease Father   . Hypertension Mother   . Breast cancer Daughter   . Heart disease Brother    History  Substance Use Topics  . Smoking status: Current Every Day Smoker -- 0.50 packs/day for 45 years    Types: Cigarettes  . Smokeless tobacco: Current User  . Alcohol Use: No   OB History   Grav Para Term Preterm Abortions TAB SAB Ect Mult Living                 Review of Systems  Constitutional: Negative for fever, chills, diaphoresis, appetite change and fatigue.  HENT: Negative for mouth sores,  sore throat and trouble swallowing.   Eyes: Negative for visual disturbance.  Respiratory: Positive for cough, chest tightness and shortness of breath. Negative for wheezing.   Cardiovascular: Positive for chest pain and leg swelling. Negative for palpitations.  Gastrointestinal: Negative for nausea, vomiting, abdominal pain, diarrhea and abdominal distention.  Endocrine: Negative for polydipsia, polyphagia and polyuria.  Genitourinary: Negative for dysuria, frequency and hematuria.  Musculoskeletal: Negative for gait problem.  Skin: Negative for color change, pallor and rash.  Neurological: Negative for dizziness, syncope, light-headedness and headaches.  Hematological: Does not bruise/bleed easily.   Psychiatric/Behavioral: Negative for behavioral problems and confusion.    Allergies  Levaquin and Penicillins  Home Medications   Current Outpatient Rx  Name  Route  Sig  Dispense  Refill  . amiodarone (PACERONE) 200 MG tablet   Oral   Take 200 mg by mouth daily.         . busPIRone (BUSPAR) 7.5 MG tablet   Oral   Take 1 tablet (7.5 mg total) by mouth 2 (two) times daily.   60 tablet   1   . Calcium Carb-Cholecalciferol (CALCIUM + D3 PO)   Oral   Take 1 tablet by mouth daily.         . cephALEXin (KEFLEX) 500 MG capsule   Oral   Take 500 mg by mouth 4 (four) times daily. For 10 days.         . citalopram (CELEXA) 10 MG tablet   Oral   Take 10 mg by mouth daily.         Marland Kitchen docusate sodium (COLACE) 100 MG capsule   Oral   Take 100 mg by mouth daily as needed for mild constipation.          . furosemide (LASIX) 40 MG tablet   Oral   Take 0.5 tablets (20 mg total) by mouth daily.   30 tablet   0   . Iron Combinations (IRON COMPLEX PO)   Oral   Take 50 mg by mouth daily.         . metoprolol tartrate (LOPRESSOR) 25 MG tablet   Oral   Take 12.5 mg by mouth 2 (two) times daily.          . pantoprazole (PROTONIX) 40 MG tablet   Oral   Take 1 tablet (40 mg total) by mouth 2 (two) times daily.   60 tablet   0   . potassium chloride SA (K-DUR,KLOR-CON) 20 MEQ tablet   Oral   Take 1 tablet (20 mEq total) by mouth 2 (two) times daily.   60 tablet   0   . temazepam (RESTORIL) 15 MG capsule   Oral   Take 15 mg by mouth at bedtime.          . vitamin B-12 (CYANOCOBALAMIN) 1000 MCG tablet   Oral   Take 1,000 mcg by mouth daily.          BP 124/79  Pulse 113  Temp(Src) 97.7 F (36.5 C) (Oral)  Wt 203 lb (92.08 kg)  SpO2 95% Physical Exam  Constitutional: She is oriented to person, place, and time. She appears well-developed and well-nourished. No distress.  Is awake alert. She is not dyspneic with conversation. She is sitting upright.   HENT:  Head: Normocephalic.  Eyes: Conjunctivae are normal. Pupils are equal, round, and reactive to light. No scleral icterus.  Neck: Normal range of motion. Neck supple. No thyromegaly present.  JVD at 45. None  sitting upright.  Cardiovascular: An irregular rhythm present. Tachycardia present.  Exam reveals gallop and S3. Exam reveals no S4 and no friction rub.   No murmur heard. Irregularly irregular. Rapid A. fib on the monitor.  Pulmonary/Chest: Effort normal and breath sounds normal. No respiratory distress. She has no wheezes. She has no rales.  Diffuse scattered rhonchi and diminished basilar breath sounds. Mild prolongation.  Abdominal: Soft. Bowel sounds are normal. She exhibits no distension. There is no tenderness. There is no rebound.  Normal abdominal exam  Musculoskeletal: Normal range of motion.  Neurological: She is alert and oriented to person, place, and time.  Skin: Skin is warm and dry. No rash noted.  Trace symmetric bilateral extremity edema.  Psychiatric: She has a normal mood and affect. Her behavior is normal.    ED Course  Procedures (including critical care time) Labs Review Labs Reviewed  CBC WITH DIFFERENTIAL - Abnormal; Notable for the following:    WBC 18.4 (*)    RBC 5.12 (*)    Hemoglobin 11.6 (*)    MCV 76.4 (*)    MCH 22.7 (*)    MCHC 29.7 (*)    RDW 25.8 (*)    Platelets 461 (*)    Neutrophils Relative % 96 (*)    Lymphocytes Relative 3 (*)    Monocytes Relative 1 (*)    Neutro Abs 17.6 (*)    Lymphs Abs 0.6 (*)    All other components within normal limits  BASIC METABOLIC PANEL - Abnormal; Notable for the following:    CO2 18 (*)    Glucose, Bld 156 (*)    BUN 28 (*)    Creatinine, Ser 1.28 (*)    GFR calc non Af Amer 44 (*)    GFR calc Af Amer 50 (*)    All other components within normal limits  PRO B NATRIURETIC PEPTIDE - Abnormal; Notable for the following:    Pro B Natriuretic peptide (BNP) 1702.0 (*)    All other components  within normal limits  POCT I-STAT TROPONIN I - Abnormal; Notable for the following:    Troponin i, poc 0.11 (*)    All other components within normal limits   Imaging Review Dg Chest 2 View  11/25/2013   CLINICAL DATA:  Chest pain, shortness of breath.  EXAM: CHEST  2 VIEW  COMPARISON:  June 28, 2013.  FINDINGS: Stable cardiomegaly. No pleural effusion or pneumothorax is noted. Stable bilateral perihilar and basilar densities are noted compared to prior exam most consistent with scarring. Superimposed pulmonary edema cannot be excluded. Bony thorax is intact.  IMPRESSION: Stable bilateral perihilar and basilar interstitial densities most consistent with scarring. Superimposed pulmonary edema cannot be excluded.   Electronically Signed   By: Roque Lias M.D.   On: 11/25/2013 16:47    EKG Interpretation    Date/Time:  Friday November 25 2013 16:03:32 EST Ventricular Rate:  115 PR Interval:    QRS Duration: 92 QT Interval:  382 QTC Calculation: 528 R Axis:   55 Text Interpretation:  Atrial fibrillation Rapid ventricular response. Confirmed by Fayrene Fearing  MD, Bonnetta Allbee ([id number]) on 11/25/2013 4:43:16 PM            MDM   1. Atrial fibrillation with rapid ventricular response   2. Congestive heart failure   3. Non Q wave myocardial infarction    With orthopnea abnormal breath sounds shortness of breath and A. fib CHF is a concern. Her troponin is elevated although minimally,  0.11. EKG shows A. fib but no obvious ST changes. She's given aspirin. Plan: x-ray labs including BNP, rate control.  18:19:  Rechecked. I was able to obtain ultrasound-guided IV. She's been given IV Lasix and metoprolol. She remains stable. I discussed the case with Dr. Nadara EatonGangi. [facility] is appropriate for transfer at this time  Procedure note wound. Using aseptic technique an ultrasound guidance I was able on the first attempt to cannulate the brachial vein. Nonpulsatile blood was aspirated and  the catheter was flushed. On recheck the ultrasound demonstrates the catheter clearly within the peripheral vein.   Rolland PorterMark Michaiah Maiden, MD 11/25/13 [phone]

## 2013-11-25 NOTE — ED Notes (Addendum)
Pt has been coughing x 2 weeks, shortness of breath x 2 weeks. Seen by PCP 4 days ago. Tx with antibiotic and cough med. C/o midsternal chest pain yesterday "after eating pinto beans". Denies chest pain, c/o soreness in the midsternal area, stated that the pressure comes and goes. Faint, anterior bilateral inspiratory wheezing noted, no cough at present

## 2013-11-25 NOTE — H&P (Signed)
[name] is an 63 y.o. female.   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  She has history of GI bleed with extensive GI workup in the past and has not inferred to be a candidate for anticoagulation. She also has chronic chest pain syndrome.  No edema, no PND or orthopnea. No hemoptysis. She has not had any recent dark stools or hematochezia. She is now being admitted with dyspnea and cough and feeling tired and fatigued. Symptoms started 1 week ago. She is presently on antibiotics for presumed pneumonia.  No fever, has chronic mild leg edema, no recent weight changes. No change in chronic sharp chest pains.  Past Medical History  Diagnosis Date  . Hypertension   . Borderline diabetes   . Irregular heart beat   . Hyperlipidemia   . CHF (congestive heart failure)   . CKD (chronic kidney disease) stage 3, GFR 30-59 ml/min   . Dysrhythmia     A-fib  . Anxiety   . Depression   . Shortness of breath     on exertion  . GERD (gastroesophageal reflux disease)   . Headache(784.0)   . Arthritis     knees  . Anemia     Past Surgical History  Procedure Laterality Date  . Hemorrhoid surgery    . Esophagogastroduodenoscopy N/A 02/25/2013    Procedure: ESOPHAGOGASTRODUODENOSCOPY (EGD);  Surgeon: Beryle Beams, MD;  Location: [facility];  Service: Endoscopy;  Laterality: N/A;  . Colonoscopy N/A 02/25/2013    Procedure: COLONOSCOPY;  Surgeon: Beryle Beams, MD;  Location: [facility];  Service: Endoscopy;  Laterality: N/A;  . Givens capsule study N/A 02/26/2013    Procedure: GIVENS CAPSULE STUDY;  Surgeon: Beryle Beams, MD;  Location: [facility];  Service: Endoscopy;  Laterality: N/A;     Family History  Problem Relation Age of Onset  . Kidney disease Mother   . Heart attack Mother   . Kidney disease Father   . Hypertension Mother   . Breast cancer Daughter   . Heart disease Brother    Social History:  reports that she has been smoking Cigarettes.  She has a 22.5 pack-year smoking history. She uses smokeless tobacco. She reports that she uses illicit drugs (Marijuana). She reports that she does not drink alcohol.  Allergies:  Allergies  Allergen Reactions  . Levaquin [Levofloxacin] Hives  . Penicillins Swelling     (Not in a hospital admission)    Review of Systems - General:Present- Feeling well and Shakiness. Not Present- Anorexia, Appetite Loss, Fatigue, Fever, Weight Gain, Weight Loss > 10lbs. and Unable to Sleep Lying Flat. HEENT:Not Present- Headache and Blurred Vision. Cardiovascular:Present- Palpitations. Not Present- Claudications. Gastrointestinal:Not Present- Abdominal Pain, Black, Tarry Stool, Bloody Stool, Change in Bowel Habits and Difficulty Swallowing. Musculoskeletal:Present- Back Pain and Joint Pain. Not Present- Leg Cramps and Leg Weakness. Neurological:Not Present- Focal Neurological Symptoms. Psychiatric:Present- Anxiety, Depression and Fearful. Not Present- Frequent crying, Suicidal Ideation and Suicidal Planning. Endocrine:Not Present- Cold Intolerance, Heat Intolerance, Polydipsia and Polyuria. Hematology:Not Present- Abnormal Bleeding, Easy Bruising and Easy Bleeding.  Blood pressure 124/79, pulse 113, temperature 97.7 F (36.5 C), temperature source Oral, weight 92.08 kg (203 lb), SpO2 95.00%.  General: Moderately build and normal body habitus who is in  no acute distress. Appears stated age. Alert Ox3.   There is no cyanosis. HEENT: normal limits. PERRLA, No JVD.   CARDIAC EXAM: S1 variable, S2 normal, no gallop present. No murmur.   CHEST EXAM: No tenderness of chest wall. LUNGS: Emphysematous and scattered  diffuse ronchi heard. Right base coarse crackles and expiratory wheeze present.  ABDOMEN: No hepatosplenomegaly. BS normal in all 4 quadrants. Abdomen is non-tender.   EXTREMITY: Full range of movementes, No edema. MUSCULOSKELETAL EXAM: Intact with full range of motion in all 4 extremities.   NEUROLOGIC EXAM: Grossly intact without any focal deficits. Alert O x 3.   VASCULAR EXAM: No skin breakdown. Carotids normal. Extremities: Femoral pulse normal. Popliteal pulse normal ; Pedal pulse normal. Otherwise No prominent pulse felt in the abdomen. No varicose veins.  Results for orders placed during the hospital encounter of 11/25/13 (from the past 48 hour(s))  CBC WITH DIFFERENTIAL     Status: Abnormal   Collection Time    11/25/13  3:57 PM      Result Value Range   WBC 18.4 (*) 4.0 - 10.5 K/uL   RBC 5.12 (*) 3.87 - 5.11 MIL/uL   Hemoglobin 11.6 (*) 12.0 - 15.0 g/dL   HCT 39.1  36.0 - 46.0 %   MCV 76.4 (*) 78.0 - 100.0 fL   MCH 22.7 (*) 26.0 - 34.0 pg   MCHC 29.7 (*) 30.0 - 36.0 g/dL   RDW 25.8 (*) 11.5 - 15.5 %   Platelets 461 (*) 150 - 400 K/uL   Neutrophils Relative % 96 (*) 43 - 77 %   Lymphocytes Relative 3 (*) 12 - 46 %   Monocytes Relative 1 (*) 3 - 12 %   Eosinophils Relative 0  0 - 5 %   Basophils Relative 0  0 - 1 %   Neutro Abs 17.6 (*) 1.7 - 7.7 K/uL   Lymphs Abs 0.6 (*) 0.7 - 4.0 K/uL   Monocytes Absolute 0.2  0.1 - 1.0 K/uL   Eosinophils Absolute 0.0  0.0 - 0.7 K/uL   Basophils Absolute 0.0  0.0 - 0.1 K/uL   RBC Morphology TARGET CELLS     Comment: ELLIPTOCYTES  BASIC METABOLIC PANEL     Status: Abnormal   Collection Time    11/25/13  3:57 PM      Result Value Range   Sodium 138  137 - 147 mEq/L   Comment: Please note change in reference range.   Potassium 4.7  3.7 - 5.3 mEq/L   Comment: Please note change in reference range.   Chloride 104  96 - 112 mEq/L   CO2 18 (*) 19 - 32 mEq/L   Glucose, Bld 156 (*) 70 - 99 mg/dL   BUN 28 (*) 6 - 23 mg/dL    Creatinine, Ser 1.28 (*) 0.50 - 1.10 mg/dL   Calcium 9.4  8.4 - 10.5 mg/dL   GFR calc non Af Amer 44 (*) >90 mL/min   GFR calc Af Amer 50 (*) >90 mL/min   Comment: (NOTE)     The eGFR has been calculated using the CKD EPI equation.     This calculation has not been validated in all clinical situations.     eGFR's persistently <90 mL/min signify possible Chronic Kidney     Disease.  PRO B NATRIURETIC PEPTIDE     Status: Abnormal   Collection Time    11/25/13  3:57 PM      Result Value Range  Pro B Natriuretic peptide (BNP) 1702.0 (*) 0 - 125 pg/mL  POCT I-STAT TROPONIN I     Status: Abnormal   Collection Time    11/25/13  3:59 PM      Result Value Range   Troponin i, poc 0.11 (*) 0.00 - 0.08 ng/mL   Comment NOTIFIED PHYSICIAN     Comment 3            Comment: Due to the release kinetics of cTnI,     a negative result within the first hours     of the onset of symptoms does not rule out     myocardial infarction with certainty.     If myocardial infarction is still suspected,     repeat the test at appropriate intervals.   Dg Chest 2 View  11/25/2013   CLINICAL DATA:  Chest pain, shortness of breath.  EXAM: CHEST  2 VIEW  COMPARISON:  June 28, 2013.  FINDINGS: Stable cardiomegaly. No pleural effusion or pneumothorax is noted. Stable bilateral perihilar and basilar densities are noted compared to prior exam most consistent with scarring. Superimposed pulmonary edema cannot be excluded. Bony thorax is intact.  IMPRESSION: Stable bilateral perihilar and basilar interstitial densities most consistent with scarring. Superimposed pulmonary edema cannot be excluded.   Electronically Signed   By: Sabino Dick M.D.   On: 11/25/2013 16:47    Labs:   Lab Results  Component Value Date   WBC 18.4* 11/25/2013   HGB 11.6* 11/25/2013   HCT 39.1 11/25/2013   MCV 76.4* 11/25/2013   PLT 461* 11/25/2013    Recent Labs Lab 11/25/13 1557  NA 138  K 4.7  CL 104  CO2 18*  BUN 28*  CREATININE 1.28*   CALCIUM 9.4  GLUCOSE 156*   Lab Results  Component Value Date   CKTOTAL 62 03/05/2012   CKMB 1.6 03/05/2012   TROPONINI <0.30 06/29/2013    Lipid Panel     Component Value Date/Time   CHOL 195 10/02/2012 0526   TRIG 115 10/02/2012 0526   HDL 44 10/02/2012 0526   CHOLHDL 4.4 10/02/2012 0526   VLDL 23 10/02/2012 0526   LDLCALC 128* 10/02/2012 0526   BNP (last 3 results)  Recent Labs  02/27/13 1220 06/28/13 1426 11/25/13 1557  PROBNP 1748.0* 348.9* 1702.0*   EKG:A. Fibrillation with RVR. Normal axis. Diffuse non specific ST-T changes. Assessment/Plan 1. A. Fibrillation with RVR. 2. Acute on chronic diastolic heart failure due to #1. 3. H/O GI Bleed not a candidate for anticoagulation. 4. COPD with pulmonary scarring and ongoing tobacco use disorder. 5. Chronic chest pain syndrome. Lexiscan stress 12/31/12: 1. Resting EKG NSR. Stress EKG was non diagnostic for ischemia. No ST-T changes of ischemia noted with pharmacologic stress testing. Stress symptoms included stomach pain and light headiness. Stress terminated due to completion of protocol. 2. Perfusion imaging study demonstrated mild soft tissue attenuation consistent with breast attenuation in the inferior wall (patient scanned sitting). There was no e/o ischemia or scar. The left ventricular systolic function was normal. This is a low risk study  Recommedation: Admit, diurese, f/u cardiac markers, suspect dyspnea due to COPD exacerbation. Doubt ACS.  Laverda Page, MD 11/25/2013, 9:34 PM [facility]. [place] Pager: [phone] Office: [phone] If no answer: Cell:  [phone]

## 2013-11-26 LAB — BASIC METABOLIC PANEL
BUN: 28 mg/dL — ABNORMAL HIGH (ref 6–23)
CO2: 28 mEq/L (ref 19–32)
Calcium: 9.3 mg/dL (ref 8.4–10.5)
Chloride: 101 mEq/L (ref 96–112)
Creatinine, Ser: 1.28 mg/dL — ABNORMAL HIGH (ref 0.50–1.10)
GFR calc non Af Amer: 44 mL/min — ABNORMAL LOW (ref >90)
GFR, EST AFRICAN AMERICAN: 50 mL/min — AB (ref >90)
GLUCOSE: 95 mg/dL (ref 70–99)
Potassium: 4.2 mEq/L (ref 3.7–5.3)
SODIUM: 141 meq/L (ref 137–147)

## 2013-11-26 LAB — TROPONIN I

## 2013-11-26 LAB — MRSA PCR SCREENING: MRSA by PCR: NEGATIVE

## 2013-11-26 LAB — PRO B NATRIURETIC PEPTIDE: PRO B NATRI PEPTIDE: 4491 pg/mL — AB (ref 0–125)

## 2013-11-26 MED ORDER — SODIUM CHLORIDE 0.9 % IV BOLUS (SEPSIS)
250.0000 mL | Freq: Once | INTRAVENOUS | Status: AC
  Administered 2013-11-26: 250 mL via INTRAVENOUS

## 2013-11-26 MED ORDER — METOPROLOL TARTRATE 25 MG PO TABS: 25.0000 mg | ORAL_TABLET | Freq: Three times a day (TID) | ORAL | Status: DC

## 2013-11-26 MED ORDER — METOPROLOL TARTRATE 12.5 MG HALF TABLET
12.5000 mg | ORAL_TABLET | Freq: Once | ORAL | Status: AC
  Administered 2013-11-26: 12.5 mg via ORAL

## 2013-11-26 MED ORDER — METOPROLOL TARTRATE 25 MG/10 ML ORAL SUSPENSION
25.0000 mg | Freq: Three times a day (TID) | ORAL | Status: DC
  Filled 2013-11-26 (×2): qty 10

## 2013-11-26 MED ORDER — METOPROLOL TARTRATE 12.5 MG HALF TABLET
12.5000 mg | ORAL_TABLET | Freq: Two times a day (BID) | ORAL | Status: DC
  Filled 2013-11-26 (×3): qty 1

## 2013-11-26 NOTE — Progress Notes (Signed)
Around 1500 pt starting trying to convert from afib RVR to SB with a rate of 37-39. BP is stable and pt denies any discomfort or change in how she feels. Will continue to monitor closely.

## 2013-11-26 NOTE — Progress Notes (Signed)
Subjective:  Feels much better, dyspnea has improved. C/O being weak. Also has palpitations.  Objective:  Vital Signs in the last 24 hours: Temp:  [97.2 F (36.2 C)-98.4 F (36.9 C)] 97.2 F (36.2 C) (01/03 1122) Pulse Rate:  [43-123] 97 (01/03 1205) Resp:  [15-35] 35 (01/03 1205) BP: (93-180)/(68-125) 93/68 mmHg (01/03 1205) SpO2:  [82 %-98 %] 94 % (01/03 1205) Weight:  [87 kg (191 lb 12.8 oz)-92.08 kg (203 lb)] 87 kg (191 lb 12.8 oz) (01/03 0445)  Intake/Output from previous day: 01/02 0701 - 01/03 0700 In: 3 [I.V.:3] Out: 1680 [Urine:1680]  Physical Exam:  General: Moderately build and normal body habitus who is in no acute distress. Appears stated age. Alert Ox3.  There is no cyanosis. HEENT: normal limits. PERRLA, No JVD.   CARDIAC EXAM: S1 variable, S2 normal, no gallop present. No murmur. Tachycardia present.  CHEST EXAM: No tenderness of chest wall. LUNGS: Emphysematous and scattered diffuse ronchi heard. Right base coarse crackles and expiratory wheeze present improved from yesterday.    ABDOMEN: No hepatosplenomegaly. BS normal in all 4 quadrants. Abdomen is non-tender.   EXTREMITY: Full range of movementes, No edema. MUSCULOSKELETAL EXAM: Intact with full range of motion in all 4 extremities.   NEUROLOGIC EXAM: Grossly intact without any focal deficits. Alert O x 3.   VASCULAR EXAM: No skin breakdown. Carotids normal. Extremities: Femoral pulse normal. Popliteal pulse normal ; Pedal pulse normal. Otherwise No prominent pulse felt in the abdomen. No varicose veins.   Lab Results:  Recent Labs  11/25/13 1557  WBC 18.4*  HGB 11.6*  PLT 461*    Recent Labs  11/25/13 1557 11/26/13 0646  NA 138 141  K 4.7 4.2  CL 104 101  CO2 18* 28  GLUCOSE 156* 95  BUN 28* 28*  CREATININE 1.28* 1.28*    Recent Labs  11/26/13 0647  TROPONINI <0.30   Lipid Panel     Component Value Date/Time   CHOL 195 10/02/2012 0526   TRIG 115 10/02/2012 0526   HDL 44  10/02/2012 0526   CHOLHDL 4.4 10/02/2012 0526   VLDL 23 10/02/2012 0526   LDLCALC 128* 10/02/2012 0526    EKG: 11/26/2012: Atypical A. Flutter with RVR and variable AV conduction, LVH, non specific T abnormality.  BNP (last 3 results)  Recent Labs  06/28/13 1426 11/25/13 1557 11/26/13 0647  PROBNP 348.9* 1702.0* 4491.0*   Scheduled Meds: . amiodarone  200 mg Oral Daily  . azithromycin  250 mg Oral Daily  . busPIRone  7.5 mg Oral BID  . citalopram  10 mg Oral Daily  . furosemide  40 mg Intravenous Q12H  . heparin  5,000 Units Subcutaneous Q8H  . losartan  25 mg Oral Daily  . metoprolol tartrate  25 mg Oral Q8H  . metoprolol tartrate  12.5 mg Oral BID  . pantoprazole  40 mg Oral BID  . potassium chloride SA  20 mEq Oral BID  . sodium chloride  3 mL Intravenous Q12H  . temazepam  15 mg Oral QHS  . vitamin B-12  1,000 mcg Oral Daily   Continuous Infusions:  PRN Meds:.sodium chloride, acetaminophen, docusate sodium, guaiFENesin, metoprolol, ondansetron (ZOFRAN) IV, sodium chloride, zolpidem   Assessment/Plan:  1. Acute on chronic  Diastolic heart failure. EF 55-60% in 2013. Needs repeat echo and will obtain one if she stays beyond tomorrow. 2. A. Fibrillation/Flutter: Not a candidate for anticoagulation due to recurrent GI Bleed. 3. Hypertension.  Rec: Daily this is  for today, recheck BMP in the morning, her beta blocker dose has been increased. She has also had bradycardia in the past hence she has a component of sick sinus syndrome, hence digoxin is not being used. Metoprolol increased to 25 mg by mouth 3 times a day.   Pamella Pert, M.D. 11/26/2013, 12:19 PM [facility] Pager: [phone] Office: [phone] If no answer: [phone]

## 2013-11-26 NOTE — Progress Notes (Signed)
Pt is sustaining a SB with a rate from 37-58. Pt is asymptomatic and denies any discomfort, N,V or dizziness.

## 2013-11-26 NOTE — Progress Notes (Addendum)
Dr. Jacinto Halim called regarding Pt. BP 86/41. Dr. Jacinto Halim said this is per patient's normal to be hypertensive then drop to hypotensive. Verbal orders given to change Metoprolol to 12.5mg  BID and NS Bolus. Instructed to hold 2200 dose of  Metoprolol 25mg .

## 2013-11-27 LAB — PRO B NATRIURETIC PEPTIDE: PRO B NATRI PEPTIDE: 2751 pg/mL — AB (ref 0–125)

## 2013-11-27 LAB — BASIC METABOLIC PANEL
BUN: 44 mg/dL — ABNORMAL HIGH (ref 6–23)
CALCIUM: 9.5 mg/dL (ref 8.4–10.5)
CO2: 24 mEq/L (ref 19–32)
CREATININE: 2.25 mg/dL — AB (ref 0.50–1.10)
Chloride: 95 mEq/L — ABNORMAL LOW (ref 96–112)
GFR, EST AFRICAN AMERICAN: 26 mL/min — AB (ref >90)
GFR, EST NON AFRICAN AMERICAN: 22 mL/min — AB (ref >90)
Glucose, Bld: 84 mg/dL (ref 70–99)
Potassium: 4.1 mEq/L (ref 3.7–5.3)
Sodium: 135 mEq/L — ABNORMAL LOW (ref 137–147)

## 2013-11-27 MED ORDER — FUROSEMIDE 40 MG PO TABS
40.0000 mg | ORAL_TABLET | Freq: Every day | ORAL | Status: DC
  Administered 2013-11-28: 11:00:00 40 mg via ORAL
  Filled 2013-11-27 (×2): qty 1

## 2013-11-27 MED ORDER — POTASSIUM CHLORIDE CRYS ER 10 MEQ PO TBCR
10.0000 meq | EXTENDED_RELEASE_TABLET | Freq: Two times a day (BID) | ORAL | Status: DC
  Administered 2013-11-27 – 2013-11-28 (×2): 10 meq via ORAL
  Filled 2013-11-27 (×3): qty 1

## 2013-11-27 MED ORDER — METOPROLOL TARTRATE 12.5 MG HALF TABLET
12.5000 mg | ORAL_TABLET | Freq: Two times a day (BID) | ORAL | Status: DC
  Administered 2013-11-27 – 2013-11-28 (×2): 12.5 mg via ORAL
  Filled 2013-11-27 (×3): qty 1

## 2013-11-27 NOTE — Progress Notes (Signed)
Subjective:  Feels much better, dyspnea has improved. Wants to go home. Has converted to sinus last night  Objective:  Vital Signs in the last 24 hours: Temp:  [97.2 F (36.2 C)-98.9 F (37.2 C)] 98.4 F (36.9 C) (01/04 0710) Pulse Rate:  [38-113] 45 (01/04 0710) Resp:  [9-35] 17 (01/04 0710) BP: (86-151)/(36-113) 143/67 mmHg (01/04 0710) SpO2:  [86 %-100 %] 97 % (01/04 0710) Weight:  [87.7 kg (193 lb 5.5 oz)] 87.7 kg (193 lb 5.5 oz) (01/04 0500)  Intake/Output from previous day: 01/03 0701 - 01/04 0700 In: 730 [P.O.:480; IV Piggyback:250] Out: 2550 [Urine:2550]  Physical Exam:  General: Moderately build and normal body habitus who is in no acute distress. Appears stated age. Alert Ox3.  There is no cyanosis. HEENT: normal limits. PERRLA, No JVD.   CARDIAC EXAM: S1 variable, S2 normal, no gallop present. No murmur. Tachycardia present.  CHEST EXAM: No tenderness of chest wall. LUNGS: Emphysematous and scattered diffuse ronchi heard. Right base coarse crackles and expiratory wheeze present improved from yesterday.    ABDOMEN: No hepatosplenomegaly. BS normal in all 4 quadrants. Abdomen is non-tender.   EXTREMITY: Full range of movementes, No edema. MUSCULOSKELETAL EXAM: Intact with full range of motion in all 4 extremities.   NEUROLOGIC EXAM: Grossly intact without any focal deficits. Alert O x 3.   VASCULAR EXAM: No skin breakdown. Carotids normal. Extremities: Femoral pulse normal. Popliteal pulse normal ; Pedal pulse normal. Otherwise No prominent pulse felt in the abdomen. No varicose veins.   Lab Results:  Recent Labs  11/25/13 1557  WBC 18.4*  HGB 11.6*  PLT 461*    Recent Labs  11/26/13 0646 11/27/13 0055  NA 141 135*  K 4.2 4.1  CL 101 95*  CO2 28 24  GLUCOSE 95 84  BUN 28* 44*  CREATININE 1.28* 2.25*    Recent Labs  11/26/13 0647  TROPONINI <0.30   Lipid Panel     Component Value Date/Time   CHOL 195 10/02/2012 0526   TRIG 115 10/02/2012 0526    HDL 44 10/02/2012 0526   CHOLHDL 4.4 10/02/2012 0526   VLDL 23 10/02/2012 0526   LDLCALC 128* 10/02/2012 0526    EKG: 11/26/2012: Atypical A. Flutter with RVR and variable AV conduction, LVH, non specific T abnormality.  BNP (last 3 results)  Recent Labs  11/25/13 1557 11/26/13 0647 11/27/13 0055  PROBNP 1702.0* 4491.0* 2751.0*   Scheduled Meds: . amiodarone  200 mg Oral Daily  . azithromycin  250 mg Oral Daily  . busPIRone  7.5 mg Oral BID  . citalopram  10 mg Oral Daily  . furosemide  40 mg Intravenous Q12H  . heparin  5,000 Units Subcutaneous Q8H  . metoprolol tartrate  12.5 mg Oral BID  . pantoprazole  40 mg Oral BID  . potassium chloride SA  20 mEq Oral BID  . sodium chloride  3 mL Intravenous Q12H  . temazepam  15 mg Oral QHS  . vitamin B-12  1,000 mcg Oral Daily   Continuous Infusions:  PRN Meds:.sodium chloride, acetaminophen, docusate sodium, guaiFENesin, metoprolol, ondansetron (ZOFRAN) IV, sodium chloride, zolpidem   Assessment/Plan:  1. Acute on chronic  Diastolic heart failure. EF 55-60% in 2013. Needs repeat echo and will obtain one.  2. A. Fibrillation/Flutter: Not a candidate for anticoagulation due to recurrent GI Bleed. Now back in sinus rhythm, S. Bradycardia @ 45/min.  3. Acute renal failure. Due to diuresis and ARB. 3. Hypertension.  Rec: Transfer to  tele. Hold Cozaar. Discontinue Lasix BID and change to po daily. Recheck BMP in am and if trends down, home in am. Continue Metoprolol 12.5 mg PO BID (decreased from 25 mg TID. No indication for pacer. Smoking cessation discussed.   Pamella Pert, M.D. 11/27/2013, 11:20 AM [facility] Pager: [phone] Office: [phone] If no answer: [phone]

## 2013-11-28 LAB — BASIC METABOLIC PANEL
BUN: 50 mg/dL — ABNORMAL HIGH (ref 6–23)
CALCIUM: 9.5 mg/dL (ref 8.4–10.5)
CO2: 25 mEq/L (ref 19–32)
Chloride: 97 mEq/L (ref 96–112)
Creatinine, Ser: 2.12 mg/dL — ABNORMAL HIGH (ref 0.50–1.10)
GFR calc Af Amer: 27 mL/min — ABNORMAL LOW (ref >90)
GFR, EST NON AFRICAN AMERICAN: 24 mL/min — AB (ref >90)
Glucose, Bld: 104 mg/dL — ABNORMAL HIGH (ref 70–99)
Potassium: 4.6 mEq/L (ref 3.7–5.3)
SODIUM: 137 meq/L (ref 137–147)

## 2013-11-28 NOTE — Discharge Summary (Signed)
Physician Discharge Summary  Patient ID: [name] MRN: [id number] DOB/AGE: [birth year] 63 y.o.  Admit date: 11/25/2013 Discharge date: 11/28/2013  Primary Discharge Diagnosis Acute on chronic diastolic heart failure.  Preserved ejection fraction by echocardiogram in 2013. Atrial fibrillation with rapid ventricular response. Patient not a candidate for undergone ablation due to recurrent GI bleed and need for blood transfusion.  Recently underwent sclerotherapy for the GI tract at [facility]. Sick sinus syndrome with known baseline sinus bradycardia. 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  She has history of GI bleed with extensive GI workup in the past and has not inferred to be a candidate for anticoagulation. She also has chronic chest pain syndrome.  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  She has history of GI bleed with extensive GI workup in the past and has not inferred to be a candidate for anticoagulation. She also has chronic chest pain syndrome.    Recommendations on discharge: BMP in 4 days and outpatient office visit. OP Echo for EF evaluation. No ACEi or ARB for now. I have discussed regarding fluid and salt restriction and complete cessation of tobacco use.  Discharge Exam: Blood pressure 102/70, pulse 58, temperature 97.9 F (36.6 C),  temperature source Oral, resp. rate 20, height 5\' 6"  (1.676 m), weight 85.5 kg (188 lb 7.9 oz), SpO2 99.00%.  General: Moderately build and normal body habitus who is in no acute distress. Appears stated age. Alert Ox3.  There is no cyanosis. HEENT: normal limits. PERRLA, No JVD.  CARDIAC EXAM: S1 variable, S2 normal, no gallop present. No murmur.  CHEST EXAM: No tenderness of chest wall. LUNGS: Emphysematous and scattered diffuse ronchi heard. Right base coarse crackles and expiratory wheeze present.  ABDOMEN: No hepatosplenomegaly. BS normal in all 4 quadrants. Abdomen is non-tender.  EXTREMITY: Full range of movementes, No edema. MUSCULOSKELETAL EXAM: Intact with full range of motion in all 4 extremities.  NEUROLOGIC EXAM: Grossly intact without any focal deficits. Alert O x 3.  VASCULAR EXAM: No skin breakdown. Carotids normal. Extremities: Femoral pulse normal. Popliteal pulse normal ; Pedal pulse normal. Otherwise No prominent pulse felt in the abdomen. No varicose veins.  Labs:   Lab Results  Component Value Date   WBC 18.4* 11/25/2013   HGB 11.6* 11/25/2013   HCT 39.1 11/25/2013   MCV 76.4* 11/25/2013   PLT 461* 11/25/2013    Recent Labs Lab 11/28/13 0111  NA 137  K 4.6  CL 97  CO2 25  BUN 50*  CREATININE 2.12*  CALCIUM 9.5  GLUCOSE 104*   BMP on admission was 1.28and increased to 2.25 with diuresis and Cozaar and reduced to 2.1 on the day of discharge.  Lab Results  Component Value Date   HGBA1C 6.2* 06/28/2013   HGBA1C 5.6 10/03/2012   HGBA1C 6.1* 05/13/2011   . Lab Results  Component Value Date   CKTOTAL 62 03/05/2012   CKMB 1.6 03/05/2012   TROPONINI <0.30 11/26/2013    Lipid Panel  Component Value Date/Time   CHOL 195 10/02/2012 0526   TRIG 115 10/02/2012 0526   HDL 44 10/02/2012 0526   CHOLHDL 4.4 10/02/2012 0526   VLDL 23 10/02/2012 0526   LDLCALC 128* 10/02/2012 0526     BNP (last 3 results)  Recent Labs  11/25/13 1557 11/26/13 0647 11/27/13 0055  PROBNP  1702.0* 4491.0* 2751.0*    EKG: EKG on admission revealed atrial fibrillation with rapid ventricular response.  On the day of discharge telemetric revealed sinus bradycardia.    Radiology: Dg Chest 2 View  11/25/2013   CLINICAL DATA:  Chest pain, shortness of breath.  EXAM: CHEST  2 VIEW  COMPARISON:  June 28, 2013.  FINDINGS: Stable cardiomegaly. No pleural effusion or pneumothorax is noted. Stable bilateral perihilar and basilar densities are noted compared to prior exam most consistent with scarring. Superimposed pulmonary edema cannot be excluded. Bony thorax is intact.  IMPRESSION: Stable bilateral perihilar and basilar interstitial densities most consistent with scarring. Superimposed pulmonary edema cannot be excluded.   Electronically Signed   By: Roque Lias M.D.   On: 11/25/2013 16:47  FOLLOW UP PLANS AND APPOINTMENTS    Medication List    STOP taking these medications       cephALEXin 500 MG capsule  Commonly known as:  KEFLEX      TAKE these medications       amiodarone 200 MG tablet  Commonly known as:  PACERONE  Take 200 mg by mouth daily.     busPIRone 7.5 MG tablet  Commonly known as:  BUSPAR  Take 1 tablet (7.5 mg total) by mouth 2 (two) times daily.     CALCIUM + D3 PO  Take 1 tablet by mouth daily.     citalopram 10 MG tablet  Commonly known as:  CELEXA  Take 10 mg by mouth daily.     docusate sodium 100 MG capsule  Commonly known as:  COLACE  Take 100 mg by mouth daily as needed for mild constipation.     furosemide 40 MG tablet  Commonly known as:  LASIX  Take 0.5 tablets (20 mg total) by mouth daily.     IRON COMPLEX PO  Take 50 mg by mouth daily.     metoprolol tartrate 25 MG tablet  Commonly known as:  LOPRESSOR  Take 12.5 mg by mouth 2 (two) times daily.     pantoprazole 40 MG tablet  Commonly known as:  PROTONIX  Take 1 tablet (40 mg total) by mouth 2 (two) times daily.     potassium chloride SA 20 MEQ tablet  Commonly known as:   K-DUR,KLOR-CON  Take 1 tablet (20 mEq total) by mouth 2 (two) times daily.     temazepam 15 MG capsule  Commonly known as:  RESTORIL  Take 15 mg by mouth at bedtime.     vitamin B-12 1000 MCG tablet  Commonly known as:  CYANOCOBALAMIN  Take 1,000 mcg by mouth daily.           Follow-up Information   Follow up with Pamella Pert, MD On 12/12/2013. (Please have lab draw in 4 days at [facility] . Come to our office for office visit on 12/12/2013 at 12 noon.)    Specialty:  Cardiology   Contact information:   [address] [place] [address] [phone]        Pamella Pert, MD 11/28/2013, 7:07 PM  Pager: [phone] Office: [phone] If no answer: [phone]

## 2013-11-28 NOTE — Progress Notes (Signed)
Pt discharged per w/c with all belongings and paperwork. Accompanied by RN. Home via private car with daughter.

## 2013-11-28 NOTE — Progress Notes (Signed)
Went over all d/c instructions and info. Pt aware of next meds to take and follow up appts. Says daughter will come to get her.

## 2013-12-20 ENCOUNTER — Other Ambulatory Visit: Payer: Self-pay | Admitting: Nephrology

## 2013-12-20 DIAGNOSIS — N183 Chronic kidney disease, stage 3 unspecified: Secondary | ICD-10-CM

## 2013-12-20 DIAGNOSIS — I129 Hypertensive chronic kidney disease with stage 1 through stage 4 chronic kidney disease, or unspecified chronic kidney disease: Secondary | ICD-10-CM

## 2013-12-26 ENCOUNTER — Other Ambulatory Visit: Payer: Medicare Other

## 2014-01-30 ENCOUNTER — Ambulatory Visit
Admission: RE | Admit: 2014-01-30 | Discharge: 2014-01-30 | Disposition: A | Discharge status: Home / Self Care | Payer: Medicare Other | Source: Ambulatory Visit | Attending: Nephrology | Admitting: Nephrology

## 2014-01-30 DIAGNOSIS — I129 Hypertensive chronic kidney disease with stage 1 through stage 4 chronic kidney disease, or unspecified chronic kidney disease: Secondary | ICD-10-CM

## 2014-01-30 DIAGNOSIS — N183 Chronic kidney disease, stage 3 unspecified: Secondary | ICD-10-CM

## 2014-05-10 ENCOUNTER — Emergency Department (HOSPITAL_COMMUNITY): Payer: Medicare Other

## 2014-05-10 ENCOUNTER — Encounter (HOSPITAL_COMMUNITY): Payer: Self-pay | Admitting: Emergency Medicine

## 2014-05-10 ENCOUNTER — Emergency Department (HOSPITAL_COMMUNITY)
Admission: EM | Admit: 2014-05-10 | Discharge: 2014-05-10 | Disposition: A | Discharge status: Home / Self Care | Payer: Medicare Other | Attending: Emergency Medicine | Admitting: Emergency Medicine

## 2014-05-10 DIAGNOSIS — Z880 Allergy status to penicillin: Secondary | ICD-10-CM | POA: Diagnosis not present

## 2014-05-10 DIAGNOSIS — F411 Generalized anxiety disorder: Secondary | ICD-10-CM | POA: Insufficient documentation

## 2014-05-10 DIAGNOSIS — E785 Hyperlipidemia, unspecified: Secondary | ICD-10-CM | POA: Insufficient documentation

## 2014-05-10 DIAGNOSIS — Z79899 Other long term (current) drug therapy: Secondary | ICD-10-CM | POA: Diagnosis not present

## 2014-05-10 DIAGNOSIS — N183 Chronic kidney disease, stage 3 unspecified: Secondary | ICD-10-CM | POA: Diagnosis not present

## 2014-05-10 DIAGNOSIS — I129 Hypertensive chronic kidney disease with stage 1 through stage 4 chronic kidney disease, or unspecified chronic kidney disease: Secondary | ICD-10-CM | POA: Diagnosis not present

## 2014-05-10 DIAGNOSIS — F329 Major depressive disorder, single episode, unspecified: Secondary | ICD-10-CM | POA: Diagnosis not present

## 2014-05-10 DIAGNOSIS — D649 Anemia, unspecified: Secondary | ICD-10-CM | POA: Insufficient documentation

## 2014-05-10 DIAGNOSIS — J40 Bronchitis, not specified as acute or chronic: Secondary | ICD-10-CM

## 2014-05-10 DIAGNOSIS — J209 Acute bronchitis, unspecified: Secondary | ICD-10-CM | POA: Diagnosis not present

## 2014-05-10 DIAGNOSIS — I509 Heart failure, unspecified: Secondary | ICD-10-CM | POA: Diagnosis not present

## 2014-05-10 DIAGNOSIS — F17200 Nicotine dependence, unspecified, uncomplicated: Secondary | ICD-10-CM | POA: Insufficient documentation

## 2014-05-10 DIAGNOSIS — K0889 Other specified disorders of teeth and supporting structures: Secondary | ICD-10-CM

## 2014-05-10 DIAGNOSIS — F3289 Other specified depressive episodes: Secondary | ICD-10-CM | POA: Insufficient documentation

## 2014-05-10 DIAGNOSIS — Z8719 Personal history of other diseases of the digestive system: Secondary | ICD-10-CM | POA: Insufficient documentation

## 2014-05-10 DIAGNOSIS — K089 Disorder of teeth and supporting structures, unspecified: Secondary | ICD-10-CM | POA: Insufficient documentation

## 2014-05-10 DIAGNOSIS — M129 Arthropathy, unspecified: Secondary | ICD-10-CM | POA: Diagnosis not present

## 2014-05-10 MED ORDER — HYDROCODONE-ACETAMINOPHEN 5-325 MG PO TABS: 2.0000 | ORAL_TABLET | ORAL | Status: DC | PRN

## 2014-05-10 MED ORDER — AZITHROMYCIN 250 MG PO TABS: 250.0000 mg | ORAL_TABLET | Freq: Every day | ORAL | Status: DC

## 2014-05-10 MED ORDER — TRAMADOL HCL 50 MG PO TABS: 50.0000 mg | ORAL_TABLET | Freq: Four times a day (QID) | ORAL | Status: DC | PRN

## 2014-05-10 NOTE — ED Provider Notes (Signed)
CSN: [id number]     Arrival date & time 05/10/14  1351 History  This chart was scribed for Joy Beck, PA, working with Joy Argyle, MD, by Bronson Curb, ED Scribe. This patient was seen in room WTR5/WTR5 and the patient's care was started at 4:12 PM.     Chief Complaint  Patient presents with  . Dental Pain  . Cough      Patient is a 63 y.o. female presenting with tooth pain and cough. The history is provided by the patient. No language interpreter was used.  Dental Pain Location:  Upper Quality:  Localized Severity:  Moderate Duration:  1 month Timing:  Constant Progression:  Unchanged Chronicity:  New Relieved by:  None tried Worsened by:  Nothing tried Ineffective treatments:  None tried Associated symptoms: no congestion, no difficulty swallowing, no drooling, no facial pain, no facial swelling, no fever, no gum swelling, no headaches and no neck pain   Risk factors: smoking   Cough Cough characteristics:  Productive Sputum characteristics:  Clear Severity:  Moderate Duration:  2 weeks Timing:  Intermittent Chronicity:  New Smoker: yes   Context: not sick contacts   Relieved by:  None tried Worsened by:  Nothing tried Ineffective treatments:  None tried Associated symptoms: no chest pain, no chills, no fever, no headaches, no shortness of breath and no sinus congestion     HPI Comments: [name] is a 63 y.o. female who presents to the Emergency Department complaining of upper, right sided dental pain onset 1 month ago. She states she was eating and bit down on something hard and now has a loose tooth that is causing some moderate pain. Patient has not taken anything for the pain. There are no signs of infection. Patient is established with a dentist (last visit 2 years ago).   Patient also complains of intermittent cough that began 2 weeks ago. She reports the cough is worse at night and is productive of clear mucus. Patient has not taken  anything for the cough. She denies chest pain, SOB, or rhinorrhea. Patient is a current .45 PPD smoker.   Past Medical History  Diagnosis Date  . Hypertension   . Borderline diabetes   . Irregular heart beat   . Hyperlipidemia   . CHF (congestive heart failure)   . CKD (chronic kidney disease) stage 3, GFR 30-59 ml/min   . Dysrhythmia     A-fib  . Anxiety   . Depression   . Shortness of breath     on exertion  . GERD (gastroesophageal reflux disease)   . Headache(784.0)   . Arthritis     knees  . Anemia    Past Surgical History  Procedure Laterality Date  . Hemorrhoid surgery    . Esophagogastroduodenoscopy N/A 02/25/2013    Procedure: ESOPHAGOGASTRODUODENOSCOPY (EGD);  Surgeon: Theda Belfast, MD;  Location: [facility];  Service: Endoscopy;  Laterality: N/A;  . Colonoscopy N/A 02/25/2013    Procedure: COLONOSCOPY;  Surgeon: Theda Belfast, MD;  Location: [facility];  Service: Endoscopy;  Laterality: N/A;  . Givens capsule study N/A 02/26/2013    Procedure: GIVENS CAPSULE STUDY;  Surgeon: Theda Belfast, MD;  Location: [facility];  Service: Endoscopy;  Laterality: N/A;   Family History  Problem Relation Age of Onset  . Kidney disease Mother   . Heart attack Mother   . Kidney disease Father   . Hypertension Mother   . Breast cancer Daughter   . Heart  disease Brother    History  Substance Use Topics  . Smoking status: Current Every Day Smoker -- 0.25 packs/day for 45 years    Types: Cigarettes  . Smokeless tobacco: Current User  . Alcohol Use: No   OB History   Grav Para Term Preterm Abortions TAB SAB Ect Mult Living                 Review of Systems  Constitutional: Negative for fever and chills.  HENT: Positive for dental problem. Negative for congestion, drooling and facial swelling.   Respiratory: Positive for cough. Negative for shortness of breath.   Cardiovascular: Negative for chest pain.  Musculoskeletal: Negative for neck pain.  Neurological:  Negative for headaches.  All other systems reviewed and are negative.     Allergies  Levaquin and Penicillins  Home Medications   Prior to Admission medications   Medication Sig Start Date End Date Taking? Authorizing Provider  acetaminophen (TYLENOL) 500 MG tablet Take 1,000 mg by mouth every 6 (six) hours as needed for mild pain.   Yes Historical Provider, MD  amiodarone (PACERONE) 200 MG tablet Take 200 mg by mouth daily.   Yes Historical Provider, MD  Calcium Carb-Cholecalciferol (CALCIUM + D3 PO) Take 1 tablet by mouth daily.   Yes Historical Provider, MD  citalopram (CELEXA) 10 MG tablet Take 10 mg by mouth daily.   Yes Historical Provider, MD  docusate sodium (COLACE) 100 MG capsule Take 100 mg by mouth daily as needed for mild constipation.    Yes Historical Provider, MD  furosemide (LASIX) 40 MG tablet Take 0.5 tablets (20 mg total) by mouth daily. 02/28/13  Yes Kathlen Mody, MD  metoprolol tartrate (LOPRESSOR) 25 MG tablet Take 12.5 mg by mouth 2 (two) times daily.  02/28/13  Yes Kathlen Mody, MD  pantoprazole (PROTONIX) 40 MG tablet Take 40 mg by mouth 2 (two) times daily.   Yes Historical Provider, MD  potassium chloride SA (K-DUR,KLOR-CON) 20 MEQ tablet Take 1 tablet (20 mEq total) by mouth 2 (two) times daily. 05/26/12  Yes Richarda Overlie, MD  vitamin B-12 (CYANOCOBALAMIN) 1000 MCG tablet Take 1,000 mcg by mouth daily.   Yes Historical Provider, MD   Triage Vitals: BP 118/75  Pulse 65  Temp(Src) 98.3 F (36.8 C)  Resp 20  SpO2 100%  Physical Exam  Nursing note and vitals reviewed. Constitutional: She is oriented to person, place, and time. She appears well-developed and well-nourished. No distress.  HENT:  Head: Normocephalic and atraumatic.  Poor dentition. Right upper bicuspid tender to percussion and loose in the socket.   Eyes: Conjunctivae and EOM are normal.  Neck: Neck supple. No tracheal deviation present.  Cardiovascular: Normal rate.   Pulmonary/Chest: Effort  normal. No respiratory distress. She has wheezes. She has no rales.  Occasional wheezes heard throughout bilateral lungs.   Musculoskeletal: Normal range of motion.  Neurological: She is alert and oriented to person, place, and time.  Skin: Skin is warm and dry.  Psychiatric: She has a normal mood and affect. Her behavior is normal.    ED Course  Procedures (including critical care time)  DIAGNOSTIC STUDIES: Oxygen Saturation is 100% on room air, normal by my interpretation.    COORDINATION OF CARE: At 1616 Discussed treatment plan with patient which includes tramadol and Z-Pak. Patient agrees.  Labs Review Labs Reviewed - No data to display  Imaging Review Dg Chest 2 View  05/10/2014   CLINICAL DATA:  Productive cough  EXAM:  CHEST  2 VIEW  COMPARISON:  11/25/2013  FINDINGS: Cardiomediastinal silhouette is stable. Mild thoracic levoscoliosis. Stable perihilar and bilateral basilar atelectasis or scarring. Central mild bronchitic changes. No segmental infiltrate.  IMPRESSION: Stable perihilar and bilateral basilar scarring. Central mild bronchitic changes. No segmental infiltrate.   Electronically Signed   By: Natasha Mead M.D.   On: 05/10/2014 15:37     EKG Interpretation None      MDM   Final diagnoses:  Bronchitis  Pain, dental    Patient's chest xray shows bronchitic changes. Patient will have azithromycin. Patient will have vicodin for dental pain. Patient advised to follow up with her dentist and PCP. Vitals stable and patient afebrile.   I personally performed the services described in this documentation, which was scribed in my presence. The recorded information has been reviewed and is accurate.    Joy Beck, PA-C 05/10/14 [address] PA-C 05/10/14 1725

## 2014-05-10 NOTE — Discharge Instructions (Signed)
Take Tramadol as needed for pain. Take Azithromycin as directed until gone. Follow up with your dentist for further evaluation. Refer to attached documents for more information.

## 2014-05-10 NOTE — ED Notes (Signed)
Pt presents to ed with c/o toothache and cough. Pt sts has upper right side tooth that's loose and very painful, also reports productive cough x 2 weeks.

## 2014-05-10 NOTE — ED Provider Notes (Signed)
Medical screening examination/treatment/procedure(s) were performed by non-physician practitioner and as supervising physician I was immediately available for consultation/collaboration.   EKG Interpretation None      Devoria Albe, MD, Armando Gang   Ward Givens, MD 05/10/14 [phone]

## 2014-06-13 ENCOUNTER — Ambulatory Visit: Payer: Medicare Other | Attending: Family Medicine

## 2014-06-13 DIAGNOSIS — M25569 Pain in unspecified knee: Secondary | ICD-10-CM | POA: Diagnosis not present

## 2014-06-13 DIAGNOSIS — R262 Difficulty in walking, not elsewhere classified: Secondary | ICD-10-CM | POA: Diagnosis not present

## 2014-06-13 DIAGNOSIS — IMO0001 Reserved for inherently not codable concepts without codable children: Secondary | ICD-10-CM | POA: Insufficient documentation

## 2014-06-13 DIAGNOSIS — M6281 Muscle weakness (generalized): Secondary | ICD-10-CM | POA: Insufficient documentation

## 2014-06-13 DIAGNOSIS — R609 Edema, unspecified: Secondary | ICD-10-CM | POA: Insufficient documentation

## 2014-06-21 ENCOUNTER — Ambulatory Visit: Payer: Medicare Other | Admitting: Rehabilitation

## 2014-06-21 DIAGNOSIS — IMO0001 Reserved for inherently not codable concepts without codable children: Secondary | ICD-10-CM | POA: Diagnosis not present

## 2014-06-28 ENCOUNTER — Ambulatory Visit: Payer: Medicare Other | Attending: Family Medicine | Admitting: Physical Therapy

## 2014-06-28 DIAGNOSIS — M6281 Muscle weakness (generalized): Secondary | ICD-10-CM | POA: Insufficient documentation

## 2014-06-28 DIAGNOSIS — R262 Difficulty in walking, not elsewhere classified: Secondary | ICD-10-CM | POA: Insufficient documentation

## 2014-06-28 DIAGNOSIS — R609 Edema, unspecified: Secondary | ICD-10-CM | POA: Insufficient documentation

## 2014-06-28 DIAGNOSIS — M25569 Pain in unspecified knee: Secondary | ICD-10-CM | POA: Insufficient documentation

## 2014-06-28 DIAGNOSIS — IMO0001 Reserved for inherently not codable concepts without codable children: Secondary | ICD-10-CM | POA: Insufficient documentation

## 2014-07-05 ENCOUNTER — Ambulatory Visit: Payer: Medicare Other

## 2014-08-23 ENCOUNTER — Encounter (HOSPITAL_COMMUNITY): Payer: Self-pay | Admitting: Emergency Medicine

## 2014-08-23 ENCOUNTER — Emergency Department (HOSPITAL_COMMUNITY)
Admission: EM | Admit: 2014-08-23 | Discharge: 2014-08-23 | Disposition: A | Discharge status: Home / Self Care | Payer: Medicare Other | Attending: Emergency Medicine | Admitting: Emergency Medicine

## 2014-08-23 DIAGNOSIS — F329 Major depressive disorder, single episode, unspecified: Secondary | ICD-10-CM | POA: Diagnosis not present

## 2014-08-23 DIAGNOSIS — I509 Heart failure, unspecified: Secondary | ICD-10-CM | POA: Diagnosis not present

## 2014-08-23 DIAGNOSIS — N183 Chronic kidney disease, stage 3 unspecified: Secondary | ICD-10-CM | POA: Diagnosis not present

## 2014-08-23 DIAGNOSIS — IMO0002 Reserved for concepts with insufficient information to code with codable children: Secondary | ICD-10-CM

## 2014-08-23 DIAGNOSIS — I129 Hypertensive chronic kidney disease with stage 1 through stage 4 chronic kidney disease, or unspecified chronic kidney disease: Secondary | ICD-10-CM | POA: Diagnosis not present

## 2014-08-23 DIAGNOSIS — F17200 Nicotine dependence, unspecified, uncomplicated: Secondary | ICD-10-CM | POA: Insufficient documentation

## 2014-08-23 DIAGNOSIS — M1710 Unilateral primary osteoarthritis, unspecified knee: Secondary | ICD-10-CM | POA: Insufficient documentation

## 2014-08-23 DIAGNOSIS — E785 Hyperlipidemia, unspecified: Secondary | ICD-10-CM | POA: Insufficient documentation

## 2014-08-23 DIAGNOSIS — D649 Anemia, unspecified: Secondary | ICD-10-CM | POA: Diagnosis not present

## 2014-08-23 DIAGNOSIS — M25569 Pain in unspecified knee: Secondary | ICD-10-CM | POA: Diagnosis present

## 2014-08-23 DIAGNOSIS — I4891 Unspecified atrial fibrillation: Secondary | ICD-10-CM | POA: Diagnosis not present

## 2014-08-23 DIAGNOSIS — M25561 Pain in right knee: Secondary | ICD-10-CM

## 2014-08-23 DIAGNOSIS — K219 Gastro-esophageal reflux disease without esophagitis: Secondary | ICD-10-CM | POA: Insufficient documentation

## 2014-08-23 DIAGNOSIS — Z79899 Other long term (current) drug therapy: Secondary | ICD-10-CM | POA: Diagnosis not present

## 2014-08-23 DIAGNOSIS — F411 Generalized anxiety disorder: Secondary | ICD-10-CM | POA: Diagnosis not present

## 2014-08-23 DIAGNOSIS — F3289 Other specified depressive episodes: Secondary | ICD-10-CM | POA: Diagnosis not present

## 2014-08-23 DIAGNOSIS — G8929 Other chronic pain: Secondary | ICD-10-CM

## 2014-08-23 DIAGNOSIS — Z880 Allergy status to penicillin: Secondary | ICD-10-CM | POA: Diagnosis not present

## 2014-08-23 DIAGNOSIS — Z792 Long term (current) use of antibiotics: Secondary | ICD-10-CM | POA: Insufficient documentation

## 2014-08-23 MED ORDER — OXYCODONE-ACETAMINOPHEN 5-325 MG PO TABS
1.0000 | ORAL_TABLET | Freq: Once | ORAL | Status: AC
  Administered 2014-08-23: 1 via ORAL
  Filled 2014-08-23: qty 1

## 2014-08-23 NOTE — Discharge Instructions (Signed)
Please follow up closely with your orthopedic for further management of your knee pain.  You may benefit from joint injection or knee replacement if indicated.    Arthralgia Arthralgia is joint pain. A joint is a place where two bones meet. Joint pain can happen for many reasons. The joint can be bruised, stiff, infected, or weak from aging. Pain usually goes away after resting and taking medicine for soreness.  HOME CARE  Rest the joint as told by your doctor.  Keep the sore joint raised (elevated) for the first 24 hours.  Put ice on the joint area.  Put ice in a plastic bag.  Place a towel between your skin and the bag.  Leave the ice on for 15-20 minutes, 03-04 times a day.  Wear your splint, casting, elastic bandage, or sling as told by your doctor.  Only take medicine as told by your doctor. Do not take aspirin.  Use crutches as told by your doctor. Do not put weight on the joint until told to by your doctor. GET HELP RIGHT AWAY IF:   You have bruising, puffiness (swelling), or more pain.  Your fingers or toes turn blue or start to lose feeling (numb).  Your medicine does not lessen the pain.  Your pain becomes severe.  You have a temperature by mouth above 102 F (38.9 C), not controlled by medicine.  You cannot move or use the joint. MAKE SURE YOU:   Understand these instructions.  Will watch your condition.  Will get help right away if you are not doing well or get worse. Document Released: 10/29/2009 Document Revised: 02/02/2012 Document Reviewed: 10/29/2009 [facility] Patient Information 2015 [place], [place]. This information is not intended to replace advice given to you by your health care provider. Make sure you discuss any questions you have with your health care provider.

## 2014-08-23 NOTE — ED Provider Notes (Signed)
CSN: [id number]     Arrival date & time 08/23/14  1127 History  This chart was scribed for Fayrene Helper, PA with Arby Barrette, MD by Tonye Royalty, ED Scribe. This patient was seen in room WTR8/WTR8 and the patient's care was started at 12:14 PM.    Chief Complaint  Patient presents with  . Knee Pain   The history is provided by the patient. No language interpreter was used.   HPI Comments: [name] is a 63 y.o. female who presents to the Emergency Department complaining of throbbing right knee pain with onset 6-8 months. She states the pain affects her knee "all around." She reports prior diagnosis of arthritis. She states it aches worse at night, going up and down steps, and with standing up or sitting down. She states pain sometimes radiates to her back and sides. She denies swelling or new numbness in her knee. She states her hips and ankles are okay. She denies recent injury. She wears a knee brace and exercises every morning, per PCP's instructions. She also reports using Oxycodone, prescribed by her PCP, without significant relief of symptoms. She also reports seeing Dr. Tomasa Rand, an orthopedist with [facility]. She denies fever or rash.  Past Medical History  Diagnosis Date  . Hypertension   . Borderline diabetes   . Irregular heart beat   . Hyperlipidemia   . CHF (congestive heart failure)   . CKD (chronic kidney disease) stage 3, GFR 30-59 ml/min   . Dysrhythmia     A-fib  . Anxiety   . Depression   . Shortness of breath     on exertion  . GERD (gastroesophageal reflux disease)   . Headache(784.0)   . Arthritis     knees  . Anemia    Past Surgical History  Procedure Laterality Date  . Hemorrhoid surgery    . Esophagogastroduodenoscopy N/A 02/25/2013    Procedure: ESOPHAGOGASTRODUODENOSCOPY (EGD);  Surgeon: Theda Belfast, MD;  Location: [facility];  Service: Endoscopy;  Laterality: N/A;  . Colonoscopy N/A 02/25/2013    Procedure: COLONOSCOPY;  Surgeon: Theda Belfast, MD;  Location: [facility];  Service: Endoscopy;  Laterality: N/A;  . Givens capsule study N/A 02/26/2013    Procedure: GIVENS CAPSULE STUDY;  Surgeon: Theda Belfast, MD;  Location: [facility];  Service: Endoscopy;  Laterality: N/A;   Family History  Problem Relation Age of Onset  . Kidney disease Mother   . Heart attack Mother   . Kidney disease Father   . Hypertension Mother   . Breast cancer Daughter   . Heart disease Brother    History  Substance Use Topics  . Smoking status: Current Every Day Smoker -- 0.25 packs/day for 45 years    Types: Cigarettes  . Smokeless tobacco: Current User  . Alcohol Use: No   OB History   Grav Para Term Preterm Abortions TAB SAB Ect Mult Living                 Review of Systems  Constitutional: Negative for fever.  Musculoskeletal: Positive for arthralgias.  Skin: Negative for rash.  Neurological: Negative for numbness.      Allergies  Levaquin and Penicillins  Home Medications   Prior to Admission medications   Medication Sig Start Date End Date Taking? Authorizing Provider  acetaminophen (TYLENOL) 500 MG tablet Take 1,000 mg by mouth every 6 (six) hours as needed for mild pain.    Historical Provider, MD  amiodarone (PACERONE) 200  MG tablet Take 200 mg by mouth daily.    Historical Provider, MD  azithromycin (ZITHROMAX Z-PAK) 250 MG tablet Take 1 tablet (250 mg total) by mouth daily. 500mg  PO day 1, then 250mg  PO days 205 05/10/14   Kaitlyn Szekalski, PA-C  Calcium Carb-Cholecalciferol (CALCIUM + D3 PO) Take 1 tablet by mouth daily.    Historical Provider, MD  citalopram (CELEXA) 10 MG tablet Take 10 mg by mouth daily.    Historical Provider, MD  docusate sodium (COLACE) 100 MG capsule Take 100 mg by mouth daily as needed for mild constipation.     Historical Provider, MD  furosemide (LASIX) 40 MG tablet Take 0.5 tablets (20 mg total) by mouth daily. 02/28/13   05/12/14, MD  HYDROcodone-acetaminophen (NORCO/VICODIN) 5-325  MG per tablet Take 2 tablets by mouth every 4 (four) hours as needed for moderate pain or severe pain. 05/10/14   Kaitlyn Szekalski, PA-C  metoprolol tartrate (LOPRESSOR) 25 MG tablet Take 12.5 mg by mouth 2 (two) times daily.  02/28/13   05/12/14, MD  pantoprazole (PROTONIX) 40 MG tablet Take 40 mg by mouth 2 (two) times daily.    Historical Provider, MD  potassium chloride SA (K-DUR,KLOR-CON) 20 MEQ tablet Take 1 tablet (20 mEq total) by mouth 2 (two) times daily. 05/26/12   Kathlen Mody, MD  vitamin B-12 (CYANOCOBALAMIN) 1000 MCG tablet Take 1,000 mcg by mouth daily.    Historical Provider, MD   BP 118/46  Pulse 70  Temp(Src) 97.8 F (36.6 C) (Oral)  Resp 18  SpO2 97% Physical Exam  Nursing note and vitals reviewed. Constitutional: She is oriented to person, place, and time. She appears well-developed and well-nourished.  HENT:  Head: Normocephalic and atraumatic.  Eyes: Conjunctivae are normal.  Neck: Normal range of motion. Neck supple.  Pulmonary/Chest: Effort normal.  Musculoskeletal: Normal range of motion.  Right knee tenderness along the patella and the inferior knee on palpation, no deformity noted, mild effusion noted, negative anterior and posterior drawer test, tenderness with Varus and Valgus maneuver  Neurological: She is alert and oriented to person, place, and time.  Skin: Skin is warm and dry.  Psychiatric: She has a normal mood and affect.    ED Course  Procedures (including critical care time) Labs Review Labs Reviewed - No data to display  Imaging Review No results found.   EKG Interpretation None     DIAGNOSTIC STUDIES: Oxygen Saturation is 97% on room air, normal by my interpretation.    COORDINATION OF CARE: 12:22 Pt with chronic knee pain. No evidence of infection concerning septic joint, no recent trauma concerning for acute fx.  Therefore, recommend close management by PCP or ortho for further care.  Pt has knee brace, able to ambulate, will give  pain meds here for sxs treatment only.  Does not think long term narcotic use is appropriate.   Discussed treatment plan with patient at beside, the patient agrees with the plan and has no further questions at this time.    MDM   Final diagnoses:  Chronic knee pain, right    BP 118/46  Pulse 70  Temp(Src) 97.8 F (36.6 C) (Oral)  Resp 18  SpO2 97%  I personally performed the services described in this documentation, which was scribed in my presence. The recorded information has been reviewed and is accurate.     07/27/12, PA-C 08/23/14 1249

## 2014-08-23 NOTE — ED Provider Notes (Signed)
Medical screening examination/treatment/procedure(s) were performed by non-physician practitioner and as supervising physician I was immediately available for consultation/collaboration.   EKG Interpretation None       Arby Barrette, MD 08/23/14 [phone]

## 2014-08-23 NOTE — ED Notes (Signed)
Pt c/o right knee pain that is really bad when she ambulates up and down the stairs.  Pt has on a knee brace that her doctor gave her but states that it is to big.  Pt denies contacting her doctor about the knee brace to big or the pain not getting any better.

## 2014-09-13 ENCOUNTER — Ambulatory Visit
Admission: RE | Admit: 2014-09-13 | Discharge: 2014-09-13 | Disposition: A | Discharge status: Home / Self Care | Payer: Medicare Other | Source: Ambulatory Visit | Attending: Cardiology | Admitting: Cardiology

## 2014-09-13 ENCOUNTER — Other Ambulatory Visit: Payer: Self-pay | Admitting: Cardiology

## 2014-09-13 DIAGNOSIS — Z5181 Encounter for therapeutic drug level monitoring: Secondary | ICD-10-CM

## 2014-09-13 DIAGNOSIS — R0602 Shortness of breath: Secondary | ICD-10-CM

## 2014-09-13 DIAGNOSIS — Z79899 Other long term (current) drug therapy: Secondary | ICD-10-CM

## 2014-09-18 ENCOUNTER — Emergency Department (HOSPITAL_COMMUNITY)
Admission: EM | Admit: 2014-09-18 | Discharge: 2014-09-18 | Disposition: A | Discharge status: Home / Self Care | Payer: Medicare Other | Attending: Emergency Medicine | Admitting: Emergency Medicine

## 2014-09-18 ENCOUNTER — Encounter (HOSPITAL_COMMUNITY): Payer: Self-pay | Admitting: Emergency Medicine

## 2014-09-18 DIAGNOSIS — I129 Hypertensive chronic kidney disease with stage 1 through stage 4 chronic kidney disease, or unspecified chronic kidney disease: Secondary | ICD-10-CM | POA: Diagnosis not present

## 2014-09-18 DIAGNOSIS — I509 Heart failure, unspecified: Secondary | ICD-10-CM | POA: Diagnosis not present

## 2014-09-18 DIAGNOSIS — K219 Gastro-esophageal reflux disease without esophagitis: Secondary | ICD-10-CM | POA: Insufficient documentation

## 2014-09-18 DIAGNOSIS — E785 Hyperlipidemia, unspecified: Secondary | ICD-10-CM | POA: Insufficient documentation

## 2014-09-18 DIAGNOSIS — Z720 Tobacco use: Secondary | ICD-10-CM | POA: Diagnosis not present

## 2014-09-18 DIAGNOSIS — Z79899 Other long term (current) drug therapy: Secondary | ICD-10-CM | POA: Insufficient documentation

## 2014-09-18 DIAGNOSIS — K088 Other specified disorders of teeth and supporting structures: Secondary | ICD-10-CM | POA: Diagnosis present

## 2014-09-18 DIAGNOSIS — M1990 Unspecified osteoarthritis, unspecified site: Secondary | ICD-10-CM | POA: Insufficient documentation

## 2014-09-18 DIAGNOSIS — Z862 Personal history of diseases of the blood and blood-forming organs and certain disorders involving the immune mechanism: Secondary | ICD-10-CM | POA: Insufficient documentation

## 2014-09-18 DIAGNOSIS — F329 Major depressive disorder, single episode, unspecified: Secondary | ICD-10-CM | POA: Diagnosis not present

## 2014-09-18 DIAGNOSIS — M25561 Pain in right knee: Secondary | ICD-10-CM | POA: Insufficient documentation

## 2014-09-18 DIAGNOSIS — G8929 Other chronic pain: Secondary | ICD-10-CM | POA: Diagnosis not present

## 2014-09-18 DIAGNOSIS — F419 Anxiety disorder, unspecified: Secondary | ICD-10-CM | POA: Diagnosis not present

## 2014-09-18 DIAGNOSIS — K0889 Other specified disorders of teeth and supporting structures: Secondary | ICD-10-CM

## 2014-09-18 DIAGNOSIS — Z880 Allergy status to penicillin: Secondary | ICD-10-CM | POA: Diagnosis not present

## 2014-09-18 DIAGNOSIS — N183 Chronic kidney disease, stage 3 (moderate): Secondary | ICD-10-CM | POA: Insufficient documentation

## 2014-09-18 MED ORDER — CLINDAMYCIN HCL 150 MG PO CAPS: 300.0000 mg | ORAL_CAPSULE | Freq: Three times a day (TID) | ORAL | Status: DC

## 2014-09-18 MED ORDER — OXYCODONE-ACETAMINOPHEN 5-325 MG PO TABS: 1.0000 | ORAL_TABLET | Freq: Four times a day (QID) | ORAL | Status: DC | PRN

## 2014-09-18 NOTE — Discharge Instructions (Signed)
Follow-up the dentist provided. Return here as needed.

## 2014-09-18 NOTE — ED Notes (Signed)
Pt c/o chronic knee pain and toothache.  States that she has an appt on the 9th of November for her pain doctor but states he cannot wait.  Toothache x 1 wk.

## 2014-09-18 NOTE — ED Provider Notes (Signed)
CSN: [id number]     Arrival date & time 09/18/14  1343 History  This chart was scribed for a non-physician practitioner, Charlestine Night, PA-C, working with Flint Melter, MD by Julian Hy, ED Scribe. The patient was seen in WTR5/WTR5. The patient's care was started at 3:21 PM.   Chief Complaint  Patient presents with  . Dental Pain    The history is provided by the patient. No language interpreter was used.   HPI Comments: [name] is a 63 y.o. female who presents to the Emergency Department complaining of new, constant, moderate, gradually worsening upper, right dental pain onset one week ago. Pt localizes pain to her upper right canine and notes her tooth is loose at this time. Pt denies having a regular dentist. Pt denies attempting to alleviate her pain. Pt denies any other symptoms at this time.  Pt also complains of chronic right knee pain and she has a pain management specialist appointment on November 9 but has no medication to alleviate her pain at this time. Pt's pain started 7-9 months.   Past Medical History  Diagnosis Date  . Hypertension   . Borderline diabetes   . Irregular heart beat   . Hyperlipidemia   . CHF (congestive heart failure)   . CKD (chronic kidney disease) stage 3, GFR 30-59 ml/min   . Dysrhythmia     A-fib  . Anxiety   . Depression   . Shortness of breath     on exertion  . GERD (gastroesophageal reflux disease)   . Headache(784.0)   . Arthritis     knees  . Anemia    Past Surgical History  Procedure Laterality Date  . Hemorrhoid surgery    . Esophagogastroduodenoscopy N/A 02/25/2013    Procedure: ESOPHAGOGASTRODUODENOSCOPY (EGD);  Surgeon: Theda Belfast, MD;  Location: [facility];  Service: Endoscopy;  Laterality: N/A;  . Colonoscopy N/A 02/25/2013    Procedure: COLONOSCOPY;  Surgeon: Theda Belfast, MD;  Location: [facility];  Service: Endoscopy;  Laterality: N/A;  . Givens capsule study N/A 02/26/2013    Procedure: GIVENS  CAPSULE STUDY;  Surgeon: Theda Belfast, MD;  Location: [facility];  Service: Endoscopy;  Laterality: N/A;   Family History  Problem Relation Age of Onset  . Kidney disease Mother   . Heart attack Mother   . Kidney disease Father   . Hypertension Mother   . Breast cancer Daughter   . Heart disease Brother    History  Substance Use Topics  . Smoking status: Current Every Day Smoker -- 0.25 packs/day for 45 years    Types: Cigarettes  . Smokeless tobacco: Current User  . Alcohol Use: No   OB History   Grav Para Term Preterm Abortions TAB SAB Ect Mult Living                 Review of Systems  HENT: Positive for dental problem.   Musculoskeletal: Positive for arthralgias.      Allergies  Levaquin and Penicillins  Home Medications   Prior to Admission medications   Medication Sig Start Date End Date Taking? Authorizing Provider  acetaminophen (TYLENOL) 500 MG tablet Take 1,000 mg by mouth daily as needed for mild pain (tooth pain).    Yes Historical Provider, MD  amiodarone (PACERONE) 200 MG tablet Take 200 mg by mouth daily.   Yes Historical Provider, MD  Calcium Carb-Cholecalciferol (CALCIUM + D3 PO) Take 1 tablet by mouth daily.   Yes Historical  Provider, MD  citalopram (CELEXA) 10 MG tablet Take 10 mg by mouth daily.   Yes Historical Provider, MD  docusate sodium (COLACE) 100 MG capsule Take 100 mg by mouth daily as needed for mild constipation (constipation).    Yes Historical Provider, MD  furosemide (LASIX) 40 MG tablet Take 0.5 tablets (20 mg total) by mouth daily. 02/28/13  Yes Kathlen Mody, MD  HYDROcodone-acetaminophen (NORCO/VICODIN) 5-325 MG per tablet Take 1 tablet by mouth every 4 (four) hours as needed for moderate pain (pain).   Yes Historical Provider, MD  METOPROLOL TARTRATE PO Take 1 tablet by mouth daily.   Yes Historical Provider, MD  pantoprazole (PROTONIX) 40 MG tablet Take 40 mg by mouth 2 (two) times daily.   Yes Historical Provider, MD  potassium  chloride SA (K-DUR,KLOR-CON) 20 MEQ tablet Take 1 tablet (20 mEq total) by mouth 2 (two) times daily. 05/26/12  Yes Richarda Overlie, MD  vitamin B-12 (CYANOCOBALAMIN) 1000 MCG tablet Take 1,000 mcg by mouth daily.   Yes Historical Provider, MD   Triage Vitals: BP 159/74  Pulse 79  Temp(Src) 98.1 F (36.7 C) (Oral)  Resp 18  SpO2 95%  Physical Exam  Nursing note and vitals reviewed. Constitutional: She is oriented to person, place, and time. She appears well-developed and well-nourished. No distress.  HENT:  Head: Normocephalic and atraumatic.  Eyes: Conjunctivae and EOM are normal.  Neck: Neck supple. No tracheal deviation present.  Cardiovascular: Normal rate.   Pulmonary/Chest: Effort normal. No respiratory distress.  Musculoskeletal: Normal range of motion.  Neurological: She is alert and oriented to person, place, and time.  Skin: Skin is warm and dry.  Psychiatric: She has a normal mood and affect. Her behavior is normal.    ED Course  Procedures (including critical care time) DIAGNOSTIC STUDIES: Oxygen Saturation is 95% on RA, adequate by my interpretation.    COORDINATION OF CARE: 3:27 PM- Patient informed of current plan for treatment and evaluation and agrees with plan at this time. Patient is advised to return here as needed.  Given referral to dentistry  I personally performed the services described in this documentation, which was scribed in my presence. The recorded information has been reviewed and is accurate.   Carlyle Dolly, PA-C 09/18/14 1946

## 2014-09-19 NOTE — ED Provider Notes (Signed)
Medical screening examination/treatment/procedure(s) were performed by non-physician practitioner and as supervising physician I was immediately available for consultation/collaboration.   EKG Interpretation None       Flint Melter, MD 09/19/14 [phone]

## 2014-10-06 ENCOUNTER — Ambulatory Visit: Payer: Medicare Other

## 2014-10-30 ENCOUNTER — Telehealth: Payer: Self-pay | Admitting: *Deleted

## 2014-10-30 ENCOUNTER — Ambulatory Visit: Payer: Medicare Other | Attending: Anesthesiology

## 2014-10-30 DIAGNOSIS — M25561 Pain in right knee: Secondary | ICD-10-CM | POA: Insufficient documentation

## 2014-10-30 DIAGNOSIS — M25562 Pain in left knee: Secondary | ICD-10-CM | POA: Diagnosis not present

## 2014-10-30 NOTE — Therapy (Signed)
[facility] [address] [place], [place], [address] Phone: [phone]   Fax:  [phone]  Physical Therapy Evaluation  Patient Details  Name: [name] MRN: [id number] Date of Birth: [birth year]  Encounter Date: 10/30/2014      PT End of Session - 10/30/14 1607    Visit Number 1   Number of Visits 12   Date for PT Re-Evaluation 12/08/14   PT Start Time 0215   PT Stop Time 0310   PT Time Calculation (min) 55 min   Activity Tolerance Patient tolerated treatment well      Past Medical History  Diagnosis Date  . Hypertension   . Borderline diabetes   . Irregular heart beat   . Hyperlipidemia   . CHF (congestive heart failure)   . CKD (chronic kidney disease) stage 3, GFR 30-59 ml/min   . Dysrhythmia     A-fib  . Anxiety   . Depression   . Shortness of breath     on exertion  . GERD (gastroesophageal reflux disease)   . Headache(784.0)   . Arthritis     knees  . Anemia     Past Surgical History  Procedure Laterality Date  . Hemorrhoid surgery    . Esophagogastroduodenoscopy N/A 02/25/2013    Procedure: ESOPHAGOGASTRODUODENOSCOPY (EGD);  Surgeon: Theda Belfast, MD;  Location: [facility];  Service: Endoscopy;  Laterality: N/A;  . Colonoscopy N/A 02/25/2013    Procedure: COLONOSCOPY;  Surgeon: Theda Belfast, MD;  Location: [facility];  Service: Endoscopy;  Laterality: N/A;  . Givens capsule study N/A 02/26/2013    Procedure: GIVENS CAPSULE STUDY;  Surgeon: Theda Belfast, MD;  Location: [facility];  Service: Endoscopy;  Laterality: N/A;    There were no vitals taken for this visit.  Visit Diagnosis:  Bilateral knee pain      Subjective Assessment - 10/30/14 1423    Symptoms Pt presents with bilat knee pain that began >3 years ago, but was recently exacerbated in the past 6 month that limits pt's ability to sleep at night. Pain is worse with standing and walking and with stairs, and with sit to stand transfers.   Pain rates 8/10 currently, at best 0/10, 10/10 at worst. Pt is currently going to the pain clinic for knee pain and attended 2 PT visits, about 4 months ago.    Pertinent History CHF, CKD, HTN, boarderline DM, a- fib    How long can you sit comfortably? unknown   How long can you stand comfortably? 20 mins   How long can you walk comfortably? 1 block    Patient Stated Goals To bend knees better, to walk longer, less pain.    Currently in Pain? Yes   Pain Score 8    Pain Location Knee   Pain Orientation Right;Left  R>L    Pain Descriptors / Indicators Aching   Pain Type Chronic pain   Pain Onset More than a month ago   Pain Frequency Constant   Aggravating Factors  walking stairs   Pain Relieving Factors rest, ice, medication   Multiple Pain Sites No          [facility] PT Assessment - 10/30/14 1429    Assessment   Medical Diagnosis Bilat knee pain   Onset Date 10/31/11   Next MD Visit 11/10/14   Prior Therapy PT 4 months ago, pain clinic   Precautions   Precautions None   Restrictions   Weight Bearing Restrictions No  Balance Screen   Has the patient fallen in the past 6 months No   Home Environment   Living Enviornment Private residence   Living Arrangements Other relatives   Home Access Stairs to enter   Home Layout Two level   Prior Function   Level of Independence Independent with basic ADLs   Observation/Other Assessments   Observations Palpable tenderness at R ITB   PROM   Left Knee Extension 0   Left Knee Flexion 125   Strength   Right Hip Flexion 3+/5   Right Hip ABduction 3/5   Right Hip ADduction 3+/5   Left Hip Flexion 4/5   Left Hip ABduction 3+/5   Left Hip ADduction 3+/5   Right Knee Flexion 3+/5   Right Knee Extension 3+/5   Left Knee Flexion 4/5   Left Knee Extension 4/5          [facility] Adult PT Treatment/Exercise - 10/30/14 0001    Exercises   Exercises Knee/Hip   Knee/Hip Exercises: Seated   Long Arc Quad AROM;10 reps   Knee/Hip  Exercises: Supine   Heel Slides AROM;10 reps   Straight Leg Raises AROM;5 reps   Modalities   Modalities Cryotherapy   Cryotherapy   Number Minutes Cryotherapy 10 Minutes   Cryotherapy Location Knee  Bilat   Type of Cryotherapy Ice pack   Manual Therapy   Manual Therapy Joint mobilization;Massage   Joint Mobilization Bilat knee joint distraction   Massage STM to R ITB and quad with good relief          PT Education - 10/30/14 1606    Education provided Yes   Education Details PT POC and icing daily 20 mins prn    Person(s) Educated Patient   Methods Explanation;Demonstration   Comprehension Verbalized understanding;Returned demonstration          PT Short Term Goals - 10/30/14 1612    PT SHORT TERM GOAL #1   Title "Independent with initial HEP   Time 2   Period Weeks   Status New   PT SHORT TERM GOAL #2   Title "Report pain decrease at rest from  8 /10 to  6 /10 at rest.   Time 2   Period Weeks   Status New          PT Long Term Goals - 10/30/14 1613    PT LONG TERM GOAL #1   Title "Pt will be independent with advanced HEP.    Time 6   Period Weeks   Status New   PT LONG TERM GOAL #2   Title "Pain will decrease to 1/10 with all functional activities   Time 6   Period Weeks   Status New   PT LONG TERM GOAL #3   Title "R knee AAROM flexion will improve to 0-120 degrees for improved mobility to ride in car without increased pain   Time 6   Period Weeks   Status New   PT LONG TERM GOAL #4   Title "Pt will tolerate standing and walking for 1 hours without increased pain in order to return to PLOF.    Time 6   Period Weeks   Status New          Plan - 10/30/14 1608    Clinical Impression Statement Pt presents with signs and symtpoms compatible with knee arthritis. Pt presents with impairments including pain, knee weakness, impaired ROM, and difficulty with walking, stairs, and with transfers . Pt would  benefit from skilled PT for 2 times a week for 6  weeks to address above impariments and functional limitations and return to pain-free PLOF.   Pt will benefit from skilled therapeutic intervention in order to improve on the following deficits Abnormal gait;Decreased strength;Decreased mobility;Pain;Decreased endurance;Decreased activity tolerance;Difficulty walking;Decreased range of motion   Rehab Potential Good   PT Frequency 2x / week   PT Duration 6 weeks   PT Treatment/Interventions ADLs/Self Care Home Management;Cryotherapy;Stair training;Gait training;Manual techniques;Neuromuscular re-education;Therapeutic exercise;Therapeutic activities;Moist Heat;Patient/family education   PT Next Visit Plan Establish HEP. Manual therapy and modalities PRN.    PT Home Exercise Plan Level 1 knee ther ex and stretches.    Consulted and Agree with Plan of Care Patient          G-Codes - 2014/11/24 1615    Functional Assessment Tool Used Clinical judgement based on pain, ROM, difficulty with transfers, strength   Functional Limitation Mobility: Walking and moving around   Mobility: Walking and Moving Around Current Status [phone]) At least 40 percent but less than 60 percent impaired, limited or restricted   Mobility: Walking and Moving Around Goal Status [phone]) At least 20 percent but less than 40 percent impaired, limited or restricted                            Problem List Patient Active Problem List   Diagnosis Date Noted  . Congestive heart failure 11/25/2013  . Chronic diastolic heart failure 06/28/2013  . Chest pain 06/28/2013  . GERD (gastroesophageal reflux disease) 06/28/2013  . Nonspecific abnormal finding in stool contents 02/26/2013  . CKD (chronic kidney disease) stage 3, GFR 30-59 ml/min   . Microcytic anemia 05/24/2012  . Acute on chronic diastolic CHF (congestive heart failure) 05/24/2012  . Leukocytosis 05/24/2012  . Schizophrenia 05/24/2012  . Community acquired pneumonia 03/02/2012  . Atrial  fibrillation 03/02/2012  . HYPERLIPIDEMIA 05/13/2007  . GOUT 05/13/2007  . TOBACCO USER 05/13/2007  . DEPRESSION 05/13/2007  . HYPERTENSION 05/13/2007  . EXTERNAL HEMORRHOIDS 05/13/2007  . SUPRAVENTRICULAR TACHYCARDIA, HX OF 05/13/2007  . CANNABIS ABUSE, HX OF 05/13/2007   Haze Rushing, PT, DPT 24-Nov-2014 4:27 PM Phone: [phone] Fax: [phone]

## 2014-10-30 NOTE — Telephone Encounter (Signed)
appts made and printed...td 

## 2014-11-07 ENCOUNTER — Encounter: Payer: Medicare Other | Admitting: Rehabilitation

## 2014-11-08 ENCOUNTER — Ambulatory Visit: Payer: Medicare Other | Admitting: Rehabilitation

## 2014-11-09 ENCOUNTER — Ambulatory Visit: Payer: Medicare Other | Admitting: Rehabilitation

## 2014-11-09 DIAGNOSIS — M25561 Pain in right knee: Secondary | ICD-10-CM | POA: Diagnosis not present

## 2014-11-09 DIAGNOSIS — M25562 Pain in left knee: Principal | ICD-10-CM

## 2014-11-09 NOTE — Patient Instructions (Addendum)
Cryotherapy Cryotherapy means treatment with cold. Ice or gel packs can be used to reduce both pain and swelling. Ice is the most helpful within the first 24 to 48 hours after an injury or flare-up from overusing a muscle or joint. Sprains, strains, spasms, burning pain, shooting pain, and aches can all be eased with ice. Ice can also be used when recovering from surgery. Ice is effective, has very few side effects, and is safe for most people to use. PRECAUTIONS  Ice is not a safe treatment option for people with:  Raynaud phenomenon. This is a condition affecting small blood vessels in the extremities. Exposure to cold may cause your problems to return.  Cold hypersensitivity. There are many forms of cold hypersensitivity, including:  Cold urticaria. Red, itchy hives appear on the skin when the tissues begin to warm after being iced.  Cold erythema. This is a red, itchy rash caused by exposure to cold.  Cold hemoglobinuria. Red blood cells break down when the tissues begin to warm after being iced. The hemoglobin that carry oxygen are passed into the urine because they cannot combine with blood proteins fast enough.  Numbness or altered sensitivity in the area being iced. If you have any of the following conditions, do not use ice until you have discussed cryotherapy with your caregiver:  Heart conditions, such as arrhythmia, angina, or chronic heart disease.  High blood pressure.  Healing wounds or open skin in the area being iced.  Current infections.  Rheumatoid arthritis.  Poor circulation.  Diabetes. Ice slows the blood flow in the region it is applied. This is beneficial when trying to stop inflamed tissues from spreading irritating chemicals to surrounding tissues. However, if you expose your skin to cold temperatures for too long or without the proper protection, you can damage your skin or nerves. Watch for signs of skin damage due to cold. HOME CARE INSTRUCTIONS Follow  these tips to use ice and cold packs safely.  Place a dry or damp towel between the ice and skin. A damp towel will cool the skin more quickly, so you may need to shorten the time that the ice is used.  For a more rapid response, add gentle compression to the ice.  Ice for no more than 10 to 20 minutes at a time. The bonier the area you are icing, the less time it will take to get the benefits of ice.  Check your skin after 5 minutes to make sure there are no signs of a poor response to cold or skin damage.  Rest 20 minutes or more between uses.  Once your skin is numb, you can end your treatment. You can test numbness by very lightly touching your skin. The touch should be so light that you do not see the skin dimple from the pressure of your fingertip. When using ice, most people will feel these normal sensations in this order: cold, burning, aching, and numbness.  Do not use ice on someone who cannot communicate their responses to pain, such as small children or people with dementia. HOW TO MAKE AN ICE PACK Ice packs are the most common way to use ice therapy. Other methods include ice massage, ice baths, and cryosprays. Muscle creams that cause a cold, tingly feeling do not offer the same benefits that ice offers and should not be used as a substitute unless recommended by your caregiver. To make an ice pack, do one of the following:  Place crushed ice or a  bag of frozen vegetables in a sealable plastic bag. Squeeze out the excess air. Place this bag inside another plastic bag. Slide the bag into a pillowcase or place a damp towel between your skin and the bag.  Mix 3 parts water with 1 part rubbing alcohol. Freeze the mixture in a sealable plastic bag. When you remove the mixture from the freezer, it will be slushy. Squeeze out the excess air. Place this bag inside another plastic bag. Slide the bag into a pillowcase or place a damp towel between your skin and the bag.Marland Kitchen SEEK MEDICAL CARE  IF:  You develop white spots on your skin. This may give the skin a blotchy (mottled) appearance.  Your skin turns blue or pale.  Your skin becomes waxy or hard.  Your swelling gets worse. MAKE SURE YOU:   Understand these instructions.  Will watch your condition.  Will get help right away if you are not doing well or get worse. Document Released: 07/07/2011 Document Revised: 03/27/2014 Document Reviewed: 07/07/2011 [facility] Patient Information 2015 [place], [place]. This information is not intended to replace advice given to you by your health care provider. Make sure you discuss any questions you have with your health care provider.    Copyright  VHI. All rights reserved.  HIP: Flexion / KNEE: Extension, Straight Leg Raise   Raise leg, keeping knee straight. Perform slowly. __10_ reps per set, _1-2__ sets per day    Copyright  VHI. All rights reserved.     Raise leg until knee is straight. __10-20_ reps per set, _2__ sets per day,   Copyright  VHI. All rights reserved.  Short Arc [employer] a large can or rolled towel under leg. Straighten knee and leg. Hold __5__ seconds. Repeat with other leg. Repeat __10-20__ times. Do __2__ sessions per day.  http://gt2.exer.us/365   Copyright  VHI. All rights reserved.  Quad Set   Slowly tighten muscles on thigh of straight leg while counting out loud to ____. Repeat with other leg. Repeat ____ times. Do ____ sessions per day.  http://[email or web address].exer.us/361   Copyright  VHI. All rights reserved.    Iliotibial Band Stretch, Side-Lying   Lie on side facing edge of bed, top arm behind. Allow top leg to drape over edge. Hold _60__ seconds.  Repeat _1-2__ times per session. Do _2__ sessions per day. OR Hip Abductor With Hip Flexed 90 Degrees   Wrap strap around left leg, inside calf, outside thigh, hold in opposite-side hand. With leg at maximal straight leg raise, pull leg over to other side.  Hold _30__ seconds.  Relax leg. Repeat _3__ times.    Copyright  VHI. All rights reserved.  Hamstring Step 1   Straighten left knee. Keep knee level with other knee. Hold _30__ seconds. Relax knee by returning foot to start. Repeat __3_ times each leg  Copyright  VHI. All rights reserved.

## 2014-11-09 NOTE — Therapy (Signed)
[facility] [address] [place], [place], [address] Phone: [phone]   Fax:  [phone]  Physical Therapy Treatment  Patient Details  Name: [name] MRN: [id number] Date of Birth: [birth year]  Encounter Date: 11/09/2014      PT End of Session - 11/09/14 1146    Visit Number 2   Number of Visits 12   Date for PT Re-Evaluation 12/08/14   PT Start Time 1143   PT Stop Time 1245   PT Time Calculation (min) 62 min      Past Medical History  Diagnosis Date  . Hypertension   . Borderline diabetes   . Irregular heart beat   . Hyperlipidemia   . CHF (congestive heart failure)   . CKD (chronic kidney disease) stage 3, GFR 30-59 ml/min   . Dysrhythmia     A-fib  . Anxiety   . Depression   . Shortness of breath     on exertion  . GERD (gastroesophageal reflux disease)   . Headache(784.0)   . Arthritis     knees  . Anemia     Past Surgical History  Procedure Laterality Date  . Hemorrhoid surgery    . Esophagogastroduodenoscopy N/A 02/25/2013    Procedure: ESOPHAGOGASTRODUODENOSCOPY (EGD);  Surgeon: Theda Belfast, MD;  Location: [facility];  Service: Endoscopy;  Laterality: N/A;  . Colonoscopy N/A 02/25/2013    Procedure: COLONOSCOPY;  Surgeon: Theda Belfast, MD;  Location: [facility];  Service: Endoscopy;  Laterality: N/A;  . Givens capsule study N/A 02/26/2013    Procedure: GIVENS CAPSULE STUDY;  Surgeon: Theda Belfast, MD;  Location: [facility];  Service: Endoscopy;  Laterality: N/A;    There were no vitals taken for this visit.  Visit Diagnosis:  Bilateral knee pain      Subjective Assessment - 11/09/14 1144    Symptoms Right lateral knee pain 8-9/10 constant pain, left knee pain only with stair climbing   Currently in Pain? Yes   Aggravating Factors  stairs   Pain Relieving Factors rest ice meds                    [facility] Adult PT Treatment/Exercise - 11/09/14 1153    Knee/Hip  Exercises: Stretches   Active Hamstring Stretch 3 reps;30 seconds  bilat   ITB Stretch 3 reps;30 seconds  bilat   Knee/Hip Exercises: Aerobic   Stationary Bike Nustep level 3  LE only x 5 minutes   Knee/Hip Exercises: Seated   Long Arc Quad 10 reps;Both   Knee/Hip Exercises: Supine   Quad Sets Both;1 set;10 reps   Straight Leg Raises Both;1 set;10 reps   Modalities   Modalities Cryotherapy;Ultrasound   Cryotherapy   Number Minutes Cryotherapy 15 Minutes   Cryotherapy Location Knee  Bilat   Type of Cryotherapy Ice pack   Ultrasound   Ultrasound Location right distal ITB   Ultrasound Parameters 50% 1.0 w/cm2   Ultrasound Goals Pain;Other (Comment)  discontinued due to pt reporting increased pain in ant knee                PT Education - 11/09/14 1210    Education provided Yes   Education Details HEP   Person(s) Educated Patient   Methods Explanation   Comprehension Verbalized understanding          PT Short Term Goals - 11/09/14 1210    PT SHORT TERM GOAL #1   Title "Independent with initial  HEP   Time 2   Period Weeks   Status On-going   PT SHORT TERM GOAL #2   Title "Report pain decrease at rest from  8 /10 to  6 /10 at rest.   Time 2   Period Weeks   Status On-going           Plan - 11/09/14 1617    Clinical Impression Statement able to establish HEP for strength and flexibility   PT Next Visit Plan Review therex, Therex, manual and modalities PRN, [place] helpful to distal ITB?        Problem List Patient Active Problem List   Diagnosis Date Noted  . Congestive heart failure 11/25/2013  . Chronic diastolic heart failure 06/28/2013  . Chest pain 06/28/2013  . GERD (gastroesophageal reflux disease) 06/28/2013  . Nonspecific abnormal finding in stool contents 02/26/2013  . CKD (chronic kidney disease) stage 3, GFR 30-59 ml/min   . Microcytic anemia 05/24/2012  . Acute on chronic diastolic CHF (congestive heart failure) 05/24/2012  .  Leukocytosis 05/24/2012  . Schizophrenia 05/24/2012  . Community acquired pneumonia 03/02/2012  . Atrial fibrillation 03/02/2012  . HYPERLIPIDEMIA 05/13/2007  . GOUT 05/13/2007  . TOBACCO USER 05/13/2007  . DEPRESSION 05/13/2007  . HYPERTENSION 05/13/2007  . EXTERNAL HEMORRHOIDS 05/13/2007  . SUPRAVENTRICULAR TACHYCARDIA, HX OF 05/13/2007  . CANNABIS ABUSE, HX OF 05/13/2007    Sherrie Mustache, PTA 11/09/2014, 4:20 PM  [facility] [address] [place], [place], [address] Phone: [phone]   Fax:  [phone]

## 2014-11-13 ENCOUNTER — Ambulatory Visit: Payer: Medicare Other | Admitting: Physical Therapy

## 2014-11-13 DIAGNOSIS — M25562 Pain in left knee: Principal | ICD-10-CM

## 2014-11-13 DIAGNOSIS — M25561 Pain in right knee: Secondary | ICD-10-CM | POA: Diagnosis not present

## 2014-11-13 NOTE — Patient Instructions (Addendum)
Leg Extension (Hamstring)   Sit toward front edge of chair, with leg out straight, heel on floor, toes pointing toward body. Keeping back straight, bend forward at hip, breathing out through pursed lips. Return, breathing in.Hold for 30 to 60 seconds Repeat __3_ times. Repeat with other leg. Do __3_ sessions per day. Variation: Perform from standing position, with support.  Hamstring Step 1   Straighten left knee. Keep knee level with other knee or on bolster. Hold 30 to 60___ seconds. Relax knee by returning foot to start. Repeat _2-3__ times. 2 times a day   Copyright  VHI. All rights reserved.  HIP: Flexion / KNEE: Extension, Straight Leg Raise   Raise leg, keeping knee straight. Perform slowly. _10__ reps per set, _3__ sets per day, _5__ days per week   Copyright  VHI. All rights reserved.  Heel Slide   Bend knee and pull heel toward buttocks. Hold _3___ seconds. Return. Repeat with other knee. Repeat _10___ times. Do __1-2__ sessions per day.  http://gt2.exer.us/372   Copyright  VHI. All rights reserved.     Raise leg until knee is straight. __10 reps per set, __3_ sets per day, _5__ days per week  Copyright  VHI. All rights reserved.  Short Arc [employer] a large can or rolled towel under leg. Straighten knee and leg. Hold ___3_ seconds. Repeat with other leg. Repeat __50__ times. Do _1-2___ sessions per day. While watching a TV commercial FUNCTIONAL MOBILITY: Wall Squat   Stance: shoulder-width on floor, against wall. Place feet in front of hips. Bend hips and knees. Keep back straight. Do not allow knees to bend past toes. Squeeze glutes and quads to stand. _10__ reps per set, __2_ sets per day, _5_ days per week  Copyright  VHI. All rights reserved.   http://gt2.exer.us/365   Copyright  VHI. All rights reserved.  Quad Set   Slowly tighten muscles on thigh of straight leg while counting out loud to ____. Repeat with other leg. Repeat ____  times. Do ____ sessions per day.  http://gt2.exer.us/361   Copyright  VHI. All rights reserved.  Also perform partial squats using chair in front for support.  Perform 2 x 10 slowly.     Marland Kitchen

## 2014-11-13 NOTE — Therapy (Signed)
[facility] [address] [place], [place], [address] Phone: [phone]   Fax:  [phone]  Physical Therapy Treatment  Patient Details  Name: [name] MRN: [id number] Date of Birth: Oct 08, 1951  Encounter Date: 11/13/2014      PT End of Session - 11/13/14 1342    Visit Number 3   Number of Visits 12   Date for PT Re-Evaluation 12/08/14   PT Start Time 1235   PT Stop Time 1334   PT Time Calculation (min) 59 min   Equipment Utilized During Treatment Other (comment)  utilized strap for Hamstring stretch and KT tape for both knees   Activity Tolerance Patient tolerated treatment well   Behavior During Therapy [facility] for tasks assessed/performed      Past Medical History  Diagnosis Date  . Hypertension   . Borderline diabetes   . Irregular heart beat   . Hyperlipidemia   . CHF (congestive heart failure)   . CKD (chronic kidney disease) stage 3, GFR 30-59 ml/min   . Dysrhythmia     A-fib  . Anxiety   . Depression   . Shortness of breath     on exertion  . GERD (gastroesophageal reflux disease)   . Headache(784.0)   . Arthritis     knees  . Anemia     Past Surgical History  Procedure Laterality Date  . Hemorrhoid surgery    . Esophagogastroduodenoscopy N/A 02/25/2013    Procedure: ESOPHAGOGASTRODUODENOSCOPY (EGD);  Surgeon: Theda Belfast, MD;  Location: [facility];  Service: Endoscopy;  Laterality: N/A;  . Colonoscopy N/A 02/25/2013    Procedure: COLONOSCOPY;  Surgeon: Theda Belfast, MD;  Location: [facility];  Service: Endoscopy;  Laterality: N/A;  . Givens capsule study N/A 02/26/2013    Procedure: GIVENS CAPSULE STUDY;  Surgeon: Theda Belfast, MD;  Location: [facility];  Service: Endoscopy;  Laterality: N/A;    There were no vitals taken for this visit.  Visit Diagnosis:  Bilateral knee pain      Subjective Assessment - 11/13/14 1239    Symptoms Right knee mostly in back 8/10  and Left knee  only hurts when going up and down steps 6/10  no different than first day here   Pertinent History CHF, CKD, HTN, boarderline DM, a- fib    How long can you sit comfortably? 5 to 10 minutes   How long can you stand comfortably? 30 minutes   Patient Stated Goals I have been doing some of the exercises but I lost the papers   Currently in Pain? Yes   Pain Score 8    Pain Location Knee   Pain Orientation Right   Pain Descriptors / Indicators Sharp   Pain Type Chronic pain   Pain Onset More than a month ago   Pain Frequency Constant   Aggravating Factors  stairs   Pain Relieving Factors rest ice and meds.  I recently ran of meds.  Pt was directed to MD to pursue need for additional medicatation.  Pt told that best evidence promotes strenthening and exercise   Effect of Pain on Daily Activities restircts her activities and abiltiy to use stairs and rise and descend in sitting          [facility] PT Assessment - 11/13/14 1253    Assessment   Medical Diagnosis Bilat knee pain   Onset Date 10/31/11   Next MD Visit 11/10/14   Prior Therapy PT 4 months ago, pain clinic  Precautions   Precautions None   Restrictions   Weight Bearing Restrictions No   Home Environment   Living Enviornment Private residence   Living Arrangements Other relatives   Home Access Stairs to enter   Home Layout Two level   Prior Function   Level of Independence Independent with basic ADLs   Observation/Other Assessments   Observations Palpable tenderness at R ITB   PROM   Left Knee Extension --   Left Knee Flexion --   Strength   Right Hip Flexion --   Right Hip ABduction --   Right Hip ADduction --   Left Hip Flexion --   Left Hip ABduction --   Left Hip ADduction --   Right Knee Flexion --   Right Knee Extension --   Left Knee Flexion --   Left Knee Extension --                  [facility] Adult PT Treatment/Exercise - 11/13/14 1313    Posture/Postural Control   Posture/Postural Control  Postural limitations   Postural Limitations Decreased lumbar lordosis  bilataeral hyperextension of Knees   Knee/Hip Exercises: Stretches   Active Hamstring Stretch 3 reps;30 seconds  bilat   ITB Stretch 3 reps;30 seconds  bilat   ITB Stretch Limitations pt tries to overstretch, gave VC   Knee/Hip Exercises: Standing   Wall Squat 10 reps;3 seconds  x 2   Other Standing Knee Exercises semi squats with chair hold  10 x 2 sets   Knee/Hip Exercises: Seated   Long Arc Quad 10 reps;Both   Knee/Hip Exercises: Supine   Quad Sets Both;1 set;10 reps   Short Arc Quad Sets Both;2 sets;10 reps   Heel Slides Both;2 sets;10 reps   Straight Leg Raises Both;1 set;10 reps   Modalities   Modalities Cryotherapy;Ultrasound   Cryotherapy   Cryotherapy Location Knee  Bilat   Manual Therapy   Manual Therapy Other (comment)  KT taping of bilateral knees                PT Education - 11/13/14 1300    Education provided Yes   Education Details HEP given again due to pt losing last HEP. Pt HEP modified for current symptoms.  Pt was 8/10 and reduced to 6-7/10 but stated pain was 0/10 after cold packs, KT tape and exercises  Pt was also instructed on posture and how to eliminate Hypersectension of both knees in standig   Person(s) Educated Patient   Methods Explanation;Demonstration;Verbal cues;Handout   Comprehension Verbalized understanding;Returned demonstration          PT Short Term Goals - 11/13/14 1344    PT SHORT TERM GOAL #1   Title "Independent with initial HEP   Time 2   Period Weeks   Status On-going   PT SHORT TERM GOAL #2   Title "Report pain decrease at rest from  8 /10 to  6 /10 at rest.  Pt was 6-7/10 after exercise and 0/10 after ice pack   Time 2   Period Weeks   Status On-going           PT Long Term Goals - 11/13/14 1345    PT LONG TERM GOAL #1   Title "Pt will be independent with advanced HEP.    Time 6   Period Weeks   Status On-going   PT LONG TERM  GOAL #2   Title "Pain will decrease to 1/10 with all functional activities  Time 6   Period Weeks   Status On-going   PT LONG TERM GOAL #3   Title "R knee AAROM flexion will improve to 0-120 degrees for improved mobility to ride in car without increased pain   Time 6   Period Weeks   Status On-going   PT LONG TERM GOAL #4   Title "Pt will tolerate standing and walking for 1 hours without increased pain in order to return to PLOF.    Time 6   Period Weeks   Status On-going               Plan - 11/13/14 1326    Clinical Impression Statement Pt lost HEP and new HEP was given and reviewed with the patient.  Pt given KT tape for support and quad activation with 50% Knee tape activating quad and 100% compression tape over tibial tuberosity.  Pt ends treatment with ice pack on bilateral knees and states she has improved from from 8/10 to 6-7/10 pain   Pt was also instructed importance of proper body mechanics to reduce hyperextension of both knees.  After ice pack and exercise and tape pt stated she ws 0/10   Pt will benefit from skilled therapeutic intervention in order to improve on the following deficits Abnormal gait;Decreased strength;Decreased mobility;Pain;Decreased endurance;Decreased activity tolerance;Difficulty walking;Decreased range of motion   Rehab Potential Good   PT Frequency 2x / week   PT Duration 6 weeks   PT Treatment/Interventions ADLs/Self Care Home Management;Cryotherapy;Stair training;Gait training;Manual techniques;Neuromuscular re-education;Therapeutic exercise;Therapeutic activities;Moist Heat;Patient/family education   PT Next Visit Plan Progress knee strengthening with resistance/ weights as patient tolerates.  Assess goals and assess effectiveness of  KT tape.     PT Home Exercise Plan Level 1 knee ther ex and stretches.    Consulted and Agree with Plan of Care Patient        Problem List Patient Active Problem List   Diagnosis Date Noted  .  Congestive heart failure 11/25/2013  . Chronic diastolic heart failure 06/28/2013  . Chest pain 06/28/2013  . GERD (gastroesophageal reflux disease) 06/28/2013  . Nonspecific abnormal finding in stool contents 02/26/2013  . CKD (chronic kidney disease) stage 3, GFR 30-59 ml/min   . Microcytic anemia 05/24/2012  . Acute on chronic diastolic CHF (congestive heart failure) 05/24/2012  . Leukocytosis 05/24/2012  . Schizophrenia 05/24/2012  . Community acquired pneumonia 03/02/2012  . Atrial fibrillation 03/02/2012  . HYPERLIPIDEMIA 05/13/2007  . GOUT 05/13/2007  . TOBACCO USER 05/13/2007  . DEPRESSION 05/13/2007  . HYPERTENSION 05/13/2007  . EXTERNAL HEMORRHOIDS 05/13/2007  . SUPRAVENTRICULAR TACHYCARDIA, HX OF 05/13/2007  . CANNABIS ABUSE, HX OF 05/13/2007    Garen Lah, PT 11/13/2014 1:54 PM Phone: [phone] Fax: [phone]  [facility] [address] [place], [place], [address] Phone: [phone]   Fax:  [phone]

## 2014-11-20 ENCOUNTER — Ambulatory Visit: Payer: Medicare Other | Admitting: Rehabilitation

## 2014-11-20 DIAGNOSIS — M25561 Pain in right knee: Secondary | ICD-10-CM

## 2014-11-20 DIAGNOSIS — M25562 Pain in left knee: Principal | ICD-10-CM

## 2014-11-20 NOTE — Therapy (Signed)
[facility] Outpatient Rehabilitation [facility] [address] [place], [place], [address] Phone: [phone]   Fax:  [phone]  Physical Therapy Treatment  Patient Details  Name: [name] MRN: [id number] Date of Birth: [birth year]  Encounter Date: 11/20/2014      PT End of Session - 11/20/14 1532    Visit Number 4   Number of Visits 12   Date for PT Re-Evaluation 12/08/14   PT Start Time 1147   PT Stop Time 1240   PT Time Calculation (min) 53 min      Past Medical History  Diagnosis Date  . Hypertension   . Borderline diabetes   . Irregular heart beat   . Hyperlipidemia   . CHF (congestive heart failure)   . CKD (chronic kidney disease) stage 3, GFR 30-59 ml/min   . Dysrhythmia     A-fib  . Anxiety   . Depression   . Shortness of breath     on exertion  . GERD (gastroesophageal reflux disease)   . Headache(784.0)   . Arthritis     knees  . Anemia     Past Surgical History  Procedure Laterality Date  . Hemorrhoid surgery    . Esophagogastroduodenoscopy N/A 02/25/2013    Procedure: ESOPHAGOGASTRODUODENOSCOPY (EGD);  Surgeon: Theda Belfast, MD;  Location: [facility];  Service: Endoscopy;  Laterality: N/A;  . Colonoscopy N/A 02/25/2013    Procedure: COLONOSCOPY;  Surgeon: Theda Belfast, MD;  Location: [facility];  Service: Endoscopy;  Laterality: N/A;  . Givens capsule study N/A 02/26/2013    Procedure: GIVENS CAPSULE STUDY;  Surgeon: Theda Belfast, MD;  Location: [facility];  Service: Endoscopy;  Laterality: N/A;    There were no vitals taken for this visit.  Visit Diagnosis:  Bilateral knee pain      Subjective Assessment - 11/20/14 1214    Symptoms The tape decreased the pain to 7/10 with climbing stairs. It is normally 10/10 with stairs   Pertinent History CHF, CKD, HTN, boarderline DM, a- fib    Currently in Pain? Yes   Pain Score 8    Pain Location Knee   Pain Orientation Right   Pain Descriptors / Indicators Sharp    Pain Frequency Constant   Aggravating Factors  stairs   Pain Relieving Factors KT tape          [facility] PT Assessment - 11/20/14 1220    Strength   Right Hip Flexion --  4-/5   Right Hip ABduction 3/5   Left Hip Flexion --  4+/5   Left Hip ABduction 3+/5   Right Knee Flexion --  4-/5   Right Knee Extension 4/5   Left Knee Flexion --  4+/5   Left Knee Extension 4/5                  [facility] Adult PT Treatment/Exercise - 11/20/14 1227    Knee/Hip Exercises: Aerobic   Stationary Bike Rec Bike Level 1 x 7 min   Knee/Hip Exercises: Standing   Other Standing Knee Exercises squats at chair x12   Knee/Hip Exercises: Seated   Long Arc Quad 20 reps;Weights;Both   Long Arc Quad Weight 3 lbs.   Knee/Hip Exercises: Supine   Straight Leg Raises Both;2 sets;10 reps   Other Supine Knee Exercises supine green band clam unilateral 20 reps each side   Knee/Hip Exercises: Sidelying   Clams 10  reps each   Modalities   Modalities Cryotherapy;Ultrasound   Cryotherapy  Cryotherapy Location Knee  Bilat   Type of Cryotherapy Ice pack   Manual Therapy   Manual Therapy Other (comment)  KT taping of bilateral knees   Other Manual Therapy 2 Ys with comprression band to tibial tuberosity                PT Education - 11/20/14 1531    Education provided Yes   Education Details HEP   Person(s) Educated Patient   Methods Explanation;Handout   Comprehension Verbalized understanding          PT Short Term Goals - 11/13/14 1344    PT SHORT TERM GOAL #1   Title "Independent with initial HEP   Time 2   Period Weeks   Status On-going   PT SHORT TERM GOAL #2   Title "Report pain decrease at rest from  8 /10 to  6 /10 at rest.  Pt was 6-7/10 after exercise and 0/10 after ice pack   Time 2   Period Weeks   Status On-going           PT Long Term Goals - 11/13/14 1345    PT LONG TERM GOAL #1   Title "Pt will be independent with advanced HEP.    Time 6   Period  Weeks   Status On-going   PT LONG TERM GOAL #2   Title "Pain will decrease to 1/10 with all functional activities   Time 6   Period Weeks   Status On-going   PT LONG TERM GOAL #3   Title "R knee AAROM flexion will improve to 0-120 degrees for improved mobility to ride in car without increased pain   Time 6   Period Weeks   Status On-going   PT LONG TERM GOAL #4   Title "Pt will tolerate standing and walking for 1 hours without increased pain in order to return to PLOF.    Time 6   Period Weeks   Status On-going               Plan - 11/20/14 1533    Clinical Impression Statement able to initiate HEP for hip strengthening, added resistance to HEP exercises without increased pain, pt reports good pain relief with KT tape when climbing stairs.    PT Next Visit Plan Progress knee strengthening with resistance/ weights as patient tolerates.  Continue KT tape if beneficial, check goals        Problem List Patient Active Problem List   Diagnosis Date Noted  . Congestive heart failure 11/25/2013  . Chronic diastolic heart failure 06/28/2013  . Chest pain 06/28/2013  . GERD (gastroesophageal reflux disease) 06/28/2013  . Nonspecific abnormal finding in stool contents 02/26/2013  . CKD (chronic kidney disease) stage 3, GFR 30-59 ml/min   . Microcytic anemia 05/24/2012  . Acute on chronic diastolic CHF (congestive heart failure) 05/24/2012  . Leukocytosis 05/24/2012  . Schizophrenia 05/24/2012  . Community acquired pneumonia 03/02/2012  . Atrial fibrillation 03/02/2012  . HYPERLIPIDEMIA 05/13/2007  . GOUT 05/13/2007  . TOBACCO USER 05/13/2007  . DEPRESSION 05/13/2007  . HYPERTENSION 05/13/2007  . EXTERNAL HEMORRHOIDS 05/13/2007  . SUPRAVENTRICULAR TACHYCARDIA, HX OF 05/13/2007  . CANNABIS ABUSE, HX OF 05/13/2007    Sherrie Mustache, PTA 11/20/2014, 3:37 PM  [id number] [address] [place], [place],  [address] Phone: [phone]   Fax:  [phone]

## 2014-11-20 NOTE — Patient Instructions (Signed)
Abduction: Clam (Eccentric) - Side-Lying   Lie on side with knees bent. Lift top knee, keeping feet together. Keep trunk steady. Slowly lower for 3-5 seconds. 10___ reps per set, 2 sets per session _2__ sets per day,  Copyright  VHI. All rights reserved.   ABDUCTION: Sitting - Resistance Band (Active)  CAN PERFORM SITTING OR LYING DOWN WITH KNEES BENT Sit with feet flat. Lift right leg slightly and, against green resistance band, draw it out to side. Complete _2__ sets of _10__ repetitions. Perform _2__ sessions per day.  Copyright  VHI. All rights reserved.  FLEXION: Standing - Stable (Active)   Stand, both feet flat. Bend right knee, bringing heel toward buttocks.Complete _2__ sets of _10__ repetitions. Perform _2__ sessions per day.  http://gtsc.exer.us/241   Copyright  VHI. All rights reserved.

## 2014-11-23 ENCOUNTER — Ambulatory Visit: Payer: Medicare Other | Admitting: Rehabilitation

## 2014-11-27 ENCOUNTER — Ambulatory Visit: Payer: Medicare Other | Admitting: Physical Therapy

## 2014-11-30 ENCOUNTER — Ambulatory Visit: Payer: Medicare Other | Attending: Anesthesiology | Admitting: Rehabilitation

## 2014-11-30 DIAGNOSIS — M25562 Pain in left knee: Secondary | ICD-10-CM | POA: Diagnosis not present

## 2014-11-30 DIAGNOSIS — M25561 Pain in right knee: Secondary | ICD-10-CM | POA: Diagnosis not present

## 2014-11-30 NOTE — Therapy (Signed)
[facility] [address] [place], [place], [address] Phone: [phone]   Fax:  [phone]  Physical Therapy Treatment  Patient Details  Name: [name] MRN: [id number] Date of Birth: 01-Aug-1951 Referring Provider:  [name], MD  Encounter Date: 11/30/2014      PT End of Session - 11/30/14 1238    Visit Number 5   Number of Visits 12   Date for PT Re-Evaluation 12/08/14   PT Start Time 1145   PT Stop Time 1230   PT Time Calculation (min) 45 min      Past Medical History  Diagnosis Date  . Hypertension   . Borderline diabetes   . Irregular heart beat   . Hyperlipidemia   . CHF (congestive heart failure)   . CKD (chronic kidney disease) stage 3, GFR 30-59 ml/min   . Dysrhythmia     A-fib  . Anxiety   . Depression   . Shortness of breath     on exertion  . GERD (gastroesophageal reflux disease)   . Headache(784.0)   . Arthritis     knees  . Anemia     Past Surgical History  Procedure Laterality Date  . Hemorrhoid surgery    . Esophagogastroduodenoscopy N/A 02/25/2013    Procedure: ESOPHAGOGASTRODUODENOSCOPY (EGD);  Surgeon: Theda Belfast, MD;  Location: [facility];  Service: Endoscopy;  Laterality: N/A;  . Colonoscopy N/A 02/25/2013    Procedure: COLONOSCOPY;  Surgeon: Theda Belfast, MD;  Location: [facility];  Service: Endoscopy;  Laterality: N/A;  . Givens capsule study N/A 02/26/2013    Procedure: GIVENS CAPSULE STUDY;  Surgeon: Theda Belfast, MD;  Location: [facility];  Service: Endoscopy;  Laterality: N/A;    There were no vitals taken for this visit.  Visit Diagnosis:  Bilateral knee pain      Subjective Assessment - 11/30/14 1154    Symptoms I'm doing better, the right knee pain is less. The left knee still hurts only with stairs.   Currently in Pain? Yes   Pain Score 5    Pain Location Knee   Pain Orientation Right;Lateral   Pain Descriptors / Indicators Aching   Pain  Type Chronic pain   Pain Frequency Constant   Aggravating Factors  stairs    Pain Relieving Factors KT tape   Multiple Pain Sites No          [facility] PT Assessment - 11/30/14 1205    AROM   Right Knee Flexion 125  supine   Left Knee Flexion 130  supine   Strength   Right Hip ABduction 3+/5   Left Hip ABduction --  4-/5                  [facility] Adult PT Treatment/Exercise - 11/30/14 1203    Knee/Hip Exercises: Aerobic   Stationary Bike Nustep Level 4 LE only   Knee/Hip Exercises: Standing   Knee Flexion Strengthening;Both;2 sets;10 reps  4#   Knee/Hip Exercises: Seated   Long Arc Quad 2 sets;15 reps  bilat   Long Arc Quad Weight 4 lbs.   Knee/Hip Exercises: Supine   Bridges 10 reps;2 sets   Straight Leg Raises Both;2 sets;10 reps   Knee/Hip Exercises: Sidelying   Hip ABduction 2 sets;10 reps;Both   Clams 10 x 2 each side                  PT Short Term Goals - 11/30/14 1158  PT SHORT TERM GOAL #1   Title "Independent with initial HEP   Time 2   Period Weeks   Status Achieved   PT SHORT TERM GOAL #2   Title "Report pain decrease at rest from  8 /10 to  6 /10 at rest.   Time 2   Period Weeks   Status Achieved           PT Long Term Goals - 11/30/14 1158    PT LONG TERM GOAL #1   Title "Pt will be independent with advanced HEP.    Time 6   Period Weeks   Status On-going   PT LONG TERM GOAL #2   Title "Pain will decrease to 1/10 with all functional activities   Time 6   Period Weeks   Status On-going   PT LONG TERM GOAL #3   Title "R knee AAROM flexion will improve to 0-120 degrees for improved mobility to ride in car without increased pain   Time 6   Period Weeks   Status Achieved   PT LONG TERM GOAL #4   Title "Pt will tolerate standing and walking for 1 hours without increased pain in order to return to PLOF.    Time 6   Period Weeks   Status On-going               Plan - 11/30/14 1156    Clinical Impression  Statement Pt reports decreased resting pain in right knee, is compliant with HEP. She brought a knee brace she received in the mail however it was too large. She was encouraged to call the company and exchange.    PT Next Visit Plan Progress knee strengthening with resistance/ weights as patient tolerates.  Continue KT tape if beneficial, check goals        Problem List Patient Active Problem List   Diagnosis Date Noted  . Congestive heart failure 11/25/2013  . Chronic diastolic heart failure 06/28/2013  . Chest pain 06/28/2013  . GERD (gastroesophageal reflux disease) 06/28/2013  . Nonspecific abnormal finding in stool contents 02/26/2013  . CKD (chronic kidney disease) stage 3, GFR 30-59 ml/min   . Microcytic anemia 05/24/2012  . Acute on chronic diastolic CHF (congestive heart failure) 05/24/2012  . Leukocytosis 05/24/2012  . Schizophrenia 05/24/2012  . Community acquired pneumonia 03/02/2012  . Atrial fibrillation 03/02/2012  . HYPERLIPIDEMIA 05/13/2007  . GOUT 05/13/2007  . TOBACCO USER 05/13/2007  . DEPRESSION 05/13/2007  . HYPERTENSION 05/13/2007  . EXTERNAL HEMORRHOIDS 05/13/2007  . SUPRAVENTRICULAR TACHYCARDIA, HX OF 05/13/2007  . CANNABIS ABUSE, HX OF 05/13/2007    Sherrie Mustache, PTA 11/30/2014, 12:46 PM  [facility] [address] [place], [place], [address] Phone: [phone]   Fax:  [phone]

## 2014-12-04 ENCOUNTER — Ambulatory Visit: Payer: Medicare Other | Admitting: Rehabilitation

## 2014-12-04 ENCOUNTER — Other Ambulatory Visit: Payer: Self-pay | Admitting: Family Medicine

## 2014-12-04 ENCOUNTER — Telehealth: Payer: Self-pay | Admitting: *Deleted

## 2014-12-04 DIAGNOSIS — M25561 Pain in right knee: Secondary | ICD-10-CM

## 2014-12-04 DIAGNOSIS — M25562 Pain in left knee: Principal | ICD-10-CM

## 2014-12-04 NOTE — Patient Instructions (Signed)
Knee Extension: Resisted (Sitting)   With band looped around right ankle and under other foot, straighten leg with ankle loop. Keep other leg bent to increase resistance. Repeat _10___ times per set. Do __3__ sets per session. Do ___1-2_ sessions per day.  http://orth.exer.us/690   CKnee Flexion: Resisted (Sitting)   Sit with band under left foot and looped around ankle of supported leg. Pull unsupported leg back. Repeat __10__ times per set. Do ___3_ sets per session. Do __2__ sessions per day.  http://orth.exer.us/695   Copyright  VHI. All rights reserved.

## 2014-12-04 NOTE — Therapy (Addendum)
[facility] [address] [place], [place], [address] Phone: [phone]   Fax:  [phone]  Physical Therapy Treatment  Patient Details  Name: [name] MRN: [id number] Date of Birth: [birth year] Referring Provider:  [name], MD  Encounter Date: 12/04/2014      PT End of Session - 12/04/14 1203    Visit Number 6   Number of Visits 12   Date for PT Re-Evaluation 12/08/14   PT Start Time 1150      Past Medical History  Diagnosis Date  . Hypertension   . Borderline diabetes   . Irregular heart beat   . Hyperlipidemia   . CHF (congestive heart failure)   . CKD (chronic kidney disease) stage 3, GFR 30-59 ml/min   . Dysrhythmia     A-fib  . Anxiety   . Depression   . Shortness of breath     on exertion  . GERD (gastroesophageal reflux disease)   . Headache(784.0)   . Arthritis     knees  . Anemia     Past Surgical History  Procedure Laterality Date  . Hemorrhoid surgery    . Esophagogastroduodenoscopy N/A 02/25/2013    Procedure: ESOPHAGOGASTRODUODENOSCOPY (EGD);  Surgeon: Theda Belfast, MD;  Location: [facility];  Service: Endoscopy;  Laterality: N/A;  . Colonoscopy N/A 02/25/2013    Procedure: COLONOSCOPY;  Surgeon: Theda Belfast, MD;  Location: [facility];  Service: Endoscopy;  Laterality: N/A;  . Givens capsule study N/A 02/26/2013    Procedure: GIVENS CAPSULE STUDY;  Surgeon: Theda Belfast, MD;  Location: [facility];  Service: Endoscopy;  Laterality: N/A;    There were no vitals taken for this visit.  Visit Diagnosis:  No diagnosis found.      Subjective Assessment - 12/04/14 1201    Symptoms Right knee is getting better, resting pain is less   Currently in Pain? Yes   Pain Score 6    Pain Location Knee   Pain Orientation Right   Pain Descriptors / Indicators Sore   Aggravating Factors  stairs, at night   Pain Relieving Factors ice pack          [facility] PT Assessment -  12/04/14 0001    Strength   Right Knee Flexion 5/5   Right Knee Extension --  4+/5   Left Knee Flexion 5/5   Left Knee Extension 5/5                  [facility] Adult PT Treatment/Exercise - 12/04/14 1221    Knee/Hip Exercises: Machines for Strengthening   Cybex Leg Press 1 plate [id number]   Knee/Hip Exercises: Seated   Long Arc Quad 3 sets   Long Arc Quad Weight 5 lbs.  5#   Other Seated Knee Exercises Green band knee curls 25x bil   Other Seated Knee Exercises Green band LAQ for HEP                PT Education - 12/04/14 1225    Education provided Yes   Education Details HEP   Person(s) Educated Patient   Methods Explanation;Demonstration;Tactile cues;Verbal cues;Handout   Comprehension Returned demonstration          PT Short Term Goals - 11/30/14 1158    PT SHORT TERM GOAL #1   Title "Independent with initial HEP   Time 2   Period Weeks   Status Achieved   PT SHORT TERM GOAL #2   Title "Report  pain decrease at rest from  8 /10 to  6 /10 at rest.   Time 2   Period Weeks   Status Achieved           PT Long Term Goals - 11/30/14 1158    PT LONG TERM GOAL #1   Title "Pt will be independent with advanced HEP.    Time 6   Period Weeks   Status On-going   PT LONG TERM GOAL #2   Title "Pain will decrease to 1/10 with all functional activities   Time 6   Period Weeks   Status On-going   PT LONG TERM GOAL #3   Title "R knee AAROM flexion will improve to 0-120 degrees for improved mobility to ride in car without increased pain   Time 6   Period Weeks   Status Achieved   PT LONG TERM GOAL #4   Title "Pt will tolerate standing and walking for 1 hours without increased pain in order to return to PLOF.    Time 6   Period Weeks   Status On-going               Plan - 12/04/14 1234    Clinical Impression Statement Pt sent back knee brace for correct size, will get in 14 days, overall pain decreasing, strength/ROM improving, slow progress  toward goals   PT Next Visit Plan Renewal, check hip strength        Problem List Patient Active Problem List   Diagnosis Date Noted  . Congestive heart failure 11/25/2013  . Chronic diastolic heart failure 06/28/2013  . Chest pain 06/28/2013  . GERD (gastroesophageal reflux disease) 06/28/2013  . Nonspecific abnormal finding in stool contents 02/26/2013  . CKD (chronic kidney disease) stage 3, GFR 30-59 ml/min   . Microcytic anemia 05/24/2012  . Acute on chronic diastolic CHF (congestive heart failure) 05/24/2012  . Leukocytosis 05/24/2012  . Schizophrenia 05/24/2012  . Community acquired pneumonia 03/02/2012  . Atrial fibrillation 03/02/2012  . HYPERLIPIDEMIA 05/13/2007  . GOUT 05/13/2007  . TOBACCO USER 05/13/2007  . DEPRESSION 05/13/2007  . HYPERTENSION 05/13/2007  . EXTERNAL HEMORRHOIDS 05/13/2007  . SUPRAVENTRICULAR TACHYCARDIA, HX OF 05/13/2007  . CANNABIS ABUSE, HX OF 05/13/2007    Sherrie Mustache, PTA 12/04/2014, 12:36 PM  [facility] [address] [place], [place], [address] Phone: [phone]   Fax:  [phone]   Late entry 12-14-14 for G code Functional limitation mobility- clinical judgement Goal status CJ Discharge Berton Lan, PT 12/14/2014 7:55 AM Phone: [phone] Fax: [phone]

## 2014-12-04 NOTE — Telephone Encounter (Signed)
appts made and printed...td 

## 2014-12-07 ENCOUNTER — Ambulatory Visit: Payer: Medicare Other | Admitting: Physical Therapy

## 2014-12-09 ENCOUNTER — Ambulatory Visit
Admission: RE | Admit: 2014-12-09 | Discharge: 2014-12-09 | Disposition: A | Discharge status: Home / Self Care | Payer: Medicare Other | Source: Ambulatory Visit | Attending: Family Medicine | Admitting: Family Medicine

## 2014-12-09 DIAGNOSIS — M25561 Pain in right knee: Secondary | ICD-10-CM

## 2014-12-11 ENCOUNTER — Ambulatory Visit: Payer: Medicare Other | Admitting: Physical Therapy

## 2014-12-14 ENCOUNTER — Encounter: Payer: Self-pay | Admitting: Physical Therapy

## 2014-12-14 ENCOUNTER — Ambulatory Visit: Payer: Medicare Other | Admitting: Physical Therapy

## 2014-12-14 NOTE — Therapy (Signed)
[place], [place], [address] Phone: [phone]   Fax:  [phone]  Patient Details  Name: [name] MRN: [id number] Date of Birth: 1951/06/20 Referring Provider:  No ref. provider found  Encounter Date: 12/14/2014  PHYSICAL THERAPY DISCHARGE SUMMARY  Visits from Start of Care: 6  Current functional level related to goals / functional outcomes:     PT LONG TERM GOAL #1    Title  "Pt will be independent with advanced HEP.     Time  6    Period  Weeks    Status  Achieved    PT LONG TERM GOAL #2    Title  "Pain will decrease to 1/10 with all functional activities    Time  6    Period  Weeks    Status  Achieved    PT LONG TERM GOAL #3    Title  "R knee AAROM flexion will improve to 0-120 degrees for improved mobility to ride in car without increased pain    Time  6    Period  Weeks    Status  Achieved    PT LONG TERM GOAL #4    Title  "Pt will tolerate standing and walking for 1 hours without increased pain in order to return to PLOF.     Time  6    Period  Weeks    Status  Unable to assess due to pt not returning to clinic        11/09/14 1210    Remaining deficits: Pt  Cancelled all remaining appt due to pt feeling better and no longer needing PT   Education / Equipment: Initial HEP given.  Plan: Patient agrees to discharge.  Patient goals were partially met. Patient is being discharged due to being pleased with the current functional level.  ?????         Voncille Lo, [place] 12/14/2014 7:45 AM Phone: [phone] Fax: [facility] [address] [place], [place], [address] Phone: [phone]   Fax:  [phone]

## 2014-12-18 ENCOUNTER — Encounter: Payer: Medicare Other | Admitting: Physical Therapy

## 2014-12-21 ENCOUNTER — Encounter: Payer: Medicare Other | Admitting: Rehabilitation

## 2015-02-06 ENCOUNTER — Ambulatory Visit: Payer: Self-pay | Admitting: Podiatry

## 2015-02-08 ENCOUNTER — Ambulatory Visit: Payer: Medicare Other | Attending: Internal Medicine | Admitting: Physical Therapy

## 2015-02-08 DIAGNOSIS — M25561 Pain in right knee: Secondary | ICD-10-CM | POA: Insufficient documentation

## 2015-02-08 DIAGNOSIS — M25562 Pain in left knee: Secondary | ICD-10-CM | POA: Insufficient documentation

## 2015-02-08 NOTE — Therapy (Addendum)
[place], [place], [address] Phone: [phone]   Fax:  [phone]  Physical Therapy Evaluation  Patient Details  Name: [name] MRN: [id number] Date of Birth: 12-07-1950 Referring Provider:  Benito Mccreedy, MD  Encounter Date: 02/08/2015      PT End of Session - 02/08/15 1225    Visit Number 1   Number of Visits 2   Date for PT Re-Evaluation 02/22/15   PT Start Time 8850   PT Stop Time 1230   PT Time Calculation (min) 45 min   Equipment Utilized During Treatment Other (comment)  Home TENS unit   Activity Tolerance Patient tolerated treatment well   Behavior During Therapy [facility] for tasks assessed/performed      Past Medical History  Diagnosis Date  . Hypertension   . Borderline diabetes   . Irregular heart beat   . Hyperlipidemia   . CHF (congestive heart failure)   . CKD (chronic kidney disease) stage 3, GFR 30-59 ml/min   . Dysrhythmia     A-fib  . Anxiety   . Depression   . Shortness of breath     on exertion  . GERD (gastroesophageal reflux disease)   . Headache(784.0)   . Arthritis     knees  . Anemia     Past Surgical History  Procedure Laterality Date  . Hemorrhoid surgery    . Esophagogastroduodenoscopy N/A 02/25/2013    Procedure: ESOPHAGOGASTRODUODENOSCOPY (EGD);  Surgeon: Beryle Beams, MD;  Location: [facility];  Service: Endoscopy;  Laterality: N/A;  . Colonoscopy N/A 02/25/2013    Procedure: COLONOSCOPY;  Surgeon: Beryle Beams, MD;  Location: [facility];  Service: Endoscopy;  Laterality: N/A;  . Givens capsule study N/A 02/26/2013    Procedure: GIVENS CAPSULE STUDY;  Surgeon: Beryle Beams, MD;  Location: [facility];  Service: Endoscopy;  Laterality: N/A;    There were no vitals filed for this visit.  Visit Diagnosis:  Right knee pain - Plan: PT plan of care cert/re-cert      Subjective Assessment - 02/08/15 1149    Symptoms pt is a 64 y.o F  with CC or R knee pain that was referred for training with her home TENS unit.  bil knee pain with R>L with L only hurting when navigating stairs   Pertinent History     Limitations Walking;Standing   How long can you sit comfortably? 5 to 10 minutes   How long can you stand comfortably? 30 minutes   How long can you walk comfortably? 1 block    Diagnostic tests x-ray in january impresson was arthritis per pt report   Currently in Pain? Yes   Pain Score 6    Pain Location Knee   Pain Orientation Right;Left   Pain Descriptors / Indicators Aching   Pain Type Chronic pain   Pain Onset More than a month ago   Pain Frequency Constant   Aggravating Factors  ascending/ descending stairs, sit <>stand.    Pain Relieving Factors Exericise (HEP from previous therapy), ice,    Effect of Pain on Daily Activities difficulty using stairs and prolonged walking            [facility] PT Assessment - 02/08/15 1153    Assessment   Medical Diagnosis Bilat knee pain   Onset Date 10/31/11   Next MD Visit no appointment  schedule as needed   Prior Therapy yes, PT stacy   was discharged on 12/15/2014  Precautions   Precautions None   Restrictions   Weight Bearing Restrictions No   Balance Screen   Has the patient fallen in the past 6 months No   Has the patient had a decrease in activity level because of a fear of falling?  No   Is the patient reluctant to leave their home because of a fear of falling?  No   Home Environment   Living Enviornment Private residence   Living Arrangements Non-relatives/Friends   Available Help at Discharge Available 24 hours/day   Type of [place] to enter   Entrance Stairs-Number of Steps 4   Entrance Stairs-Rails Can reach both   Home Layout Two level   Alternate Level Stairs-Number of Steps 20   Alternate Level Stairs-Rails Left   Home Equipment --  TENS UNIT, knee brace   Prior Function   Level of Independence Independent with basic  ADLs;Independent with homemaking with ambulation;Independent with transfers;Independent with gait   Vocation On disability   Leisure taking care of her brother, grand son and house work   Cognition   Overall Cognitive Status Within Functional Limits for tasks assessed   ROM / Strength   AROM / PROM / Strength AROM;Strength   AROM   AROM Assessment Site Knee   Right/Left Knee Left;Right   Right Knee Extension -1   Right Knee Flexion 125   Left Knee Extension -1   Left Knee Flexion 128   Strength   Strength Assessment Site Knee   Right/Left Hip Right;Left   Right Knee Flexion 5/5   Right Knee Extension 5/5   Left Knee Flexion 5/5   Left Knee Extension 5/5   Palpation   Palpation tenderness located at the lateral joint line of the R knee                           PT Education - 02/08/15 1226    Education provided Yes   Education Details Setting and using home TENS unit safely for pain control   Person(s) Educated Patient   Methods Explanation;Demonstration   Comprehension Verbalized understanding;Returned demonstration          PT Short Term Goals - 02/08/15 1224    PT SHORT TERM GOAL #1   Title pt will be able to verbalize and demonstrate how to use TENS unit Independently for pain    Time 2   Period Weeks   Status New           PT Long Term Goals - 11/30/14 1158    PT LONG TERM GOAL #1   Title "Pt will be independent with advanced HEP.    Time 6   Period Weeks   Status On-going   PT LONG TERM GOAL #2   Title "Pain will decrease to 1/10 with all functional activities   Time 6   Period Weeks   Status On-going   PT LONG TERM GOAL #3   Title "R knee AAROM flexion will improve to 0-120 degrees for improved mobility to ride in car without increased pain   Time 6   Period Weeks   Status Achieved   PT LONG TERM GOAL #4   Title "Pt will tolerate standing and walking for 1 hours without increased pain in order to return to PLOF.    Time 6    Period Weeks   Status On-going  Plan - 03-09-15 1201    Clinical Impression Statement [name] presents to OPPT with R knee pain with referral for education and training for use of a home TENs unit purchased from [employer]. She has pain in the L knee, with no defecits in strength, and functional knee mobility. She was ordered the TENs unit per physician order for her L knee pain.  She would benefit from physical therapy to address questions and education regarding her home TENS unit.    Pt will benefit from skilled therapeutic intervention in order to improve on the following deficits Other (comment)  education for TENS unit   Rehab Potential Excellent   PT Frequency 1x / week   PT Duration 2 weeks   PT Treatment/Interventions ADLs/Self Care Home Management;Electrical Stimulation   PT Next Visit Plan assess pt's knowledge of how to use her home TENS unit   PT Home Exercise Plan use of home tens unit   Consulted and Agree with Plan of Care Patient          G-Codes - 03-09-15 1237    Functional Assessment Tool Used Clinical judgment   Functional Limitation Self care   Self Care Current Status (T3428) At least 1 percent but less than 20 percent impaired, limited or restricted   Self Care Goal Status (J6811) At least 1 percent but less than 20 percent impaired, limited or restricted       Problem List Patient Active Problem List   Diagnosis Date Noted  . Congestive heart failure 11/25/2013  . Chronic diastolic heart failure 57/26/2035  . Chest pain 06/28/2013  . GERD (gastroesophageal reflux disease) 06/28/2013  . Nonspecific abnormal finding in stool contents 02/26/2013  . CKD (chronic kidney disease) stage 3, GFR 30-59 ml/min   . Microcytic anemia 05/24/2012  . Acute on chronic diastolic CHF (congestive heart failure) 05/24/2012  . Leukocytosis 05/24/2012  . Schizophrenia 05/24/2012  . Community acquired pneumonia 03/02/2012  . Atrial  fibrillation 03/02/2012  . HYPERLIPIDEMIA 05/13/2007  . GOUT 05/13/2007  . TOBACCO USER 05/13/2007  . DEPRESSION 05/13/2007  . HYPERTENSION 05/13/2007  . EXTERNAL HEMORRHOIDS 05/13/2007  . SUPRAVENTRICULAR TACHYCARDIA, HX OF 05/13/2007  . CANNABIS ABUSE, HX OF 05/13/2007   [name] PT, DPT, LAT, ATC  03/09/15  1:33 PM   [facility] [address] [place], [place], [address] Phone: [phone]   Fax:  [phone]               PHYSICAL THERAPY DISCHARGE SUMMARY  Visits from Start of Care: 1  Current functional level related to goals / functional outcomes: See goals   Remaining deficits: See goals   Education / Equipment: HEP  Plan:                                                    Patient goals were not met. Patient is being discharged due to not returning since the last visit.  ?????        [name] PT, DPT, LAT, ATC  06/21/2015  3:49 PM

## 2015-02-19 ENCOUNTER — Ambulatory Visit: Payer: Medicare Other | Admitting: Physical Therapy

## 2015-05-31 HISTORY — PX: TOE SURGERY: SHX1073

## 2015-06-01 ENCOUNTER — Other Ambulatory Visit: Payer: Self-pay | Admitting: Internal Medicine

## 2015-06-01 DIAGNOSIS — R5381 Other malaise: Secondary | ICD-10-CM

## 2015-06-05 ENCOUNTER — Other Ambulatory Visit: Payer: Self-pay | Admitting: Internal Medicine

## 2015-06-05 DIAGNOSIS — E2839 Other primary ovarian failure: Secondary | ICD-10-CM

## 2015-06-22 ENCOUNTER — Ambulatory Visit
Admission: RE | Admit: 2015-06-22 | Discharge: 2015-06-22 | Disposition: A | Discharge status: Home / Self Care | Payer: Medicare Other | Source: Ambulatory Visit | Attending: Internal Medicine | Admitting: Internal Medicine

## 2015-06-22 DIAGNOSIS — E2839 Other primary ovarian failure: Secondary | ICD-10-CM

## 2015-07-13 ENCOUNTER — Other Ambulatory Visit: Payer: Self-pay | Admitting: Internal Medicine

## 2015-07-13 DIAGNOSIS — Z1231 Encounter for screening mammogram for malignant neoplasm of breast: Secondary | ICD-10-CM

## 2015-08-03 ENCOUNTER — Ambulatory Visit
Admission: RE | Admit: 2015-08-03 | Discharge: 2015-08-03 | Disposition: A | Discharge status: Home / Self Care | Payer: Medicare Other | Source: Ambulatory Visit | Attending: Internal Medicine | Admitting: Internal Medicine

## 2015-08-03 DIAGNOSIS — Z1231 Encounter for screening mammogram for malignant neoplasm of breast: Secondary | ICD-10-CM

## 2015-08-07 ENCOUNTER — Inpatient Hospital Stay (HOSPITAL_COMMUNITY)
Admission: EM | Admit: 2015-08-07 | Discharge: 2015-08-12 | DRG: 193 | Disposition: A | Discharge status: Home / Self Care | Payer: Medicare Other | Attending: Internal Medicine | Admitting: Internal Medicine

## 2015-08-07 ENCOUNTER — Encounter (HOSPITAL_COMMUNITY): Payer: Self-pay | Admitting: Emergency Medicine

## 2015-08-07 ENCOUNTER — Emergency Department (HOSPITAL_COMMUNITY): Payer: Medicare Other

## 2015-08-07 DIAGNOSIS — T501X5A Adverse effect of loop [high-ceiling] diuretics, initial encounter: Secondary | ICD-10-CM | POA: Diagnosis present

## 2015-08-07 DIAGNOSIS — E785 Hyperlipidemia, unspecified: Secondary | ICD-10-CM | POA: Diagnosis present

## 2015-08-07 DIAGNOSIS — Z803 Family history of malignant neoplasm of breast: Secondary | ICD-10-CM

## 2015-08-07 DIAGNOSIS — Z8249 Family history of ischemic heart disease and other diseases of the circulatory system: Secondary | ICD-10-CM

## 2015-08-07 DIAGNOSIS — E860 Dehydration: Secondary | ICD-10-CM | POA: Diagnosis present

## 2015-08-07 DIAGNOSIS — F17210 Nicotine dependence, cigarettes, uncomplicated: Secondary | ICD-10-CM | POA: Diagnosis present

## 2015-08-07 DIAGNOSIS — I5033 Acute on chronic diastolic (congestive) heart failure: Secondary | ICD-10-CM | POA: Diagnosis present

## 2015-08-07 DIAGNOSIS — I4891 Unspecified atrial fibrillation: Secondary | ICD-10-CM | POA: Diagnosis present

## 2015-08-07 DIAGNOSIS — I129 Hypertensive chronic kidney disease with stage 1 through stage 4 chronic kidney disease, or unspecified chronic kidney disease: Secondary | ICD-10-CM | POA: Diagnosis present

## 2015-08-07 DIAGNOSIS — J189 Pneumonia, unspecified organism: Principal | ICD-10-CM | POA: Diagnosis present

## 2015-08-07 DIAGNOSIS — J441 Chronic obstructive pulmonary disease with (acute) exacerbation: Secondary | ICD-10-CM | POA: Diagnosis present

## 2015-08-07 DIAGNOSIS — N183 Chronic kidney disease, stage 3 unspecified: Secondary | ICD-10-CM | POA: Diagnosis present

## 2015-08-07 DIAGNOSIS — I5032 Chronic diastolic (congestive) heart failure: Secondary | ICD-10-CM | POA: Diagnosis present

## 2015-08-07 DIAGNOSIS — K449 Diaphragmatic hernia without obstruction or gangrene: Secondary | ICD-10-CM | POA: Diagnosis present

## 2015-08-07 DIAGNOSIS — R0602 Shortness of breath: Secondary | ICD-10-CM | POA: Diagnosis not present

## 2015-08-07 DIAGNOSIS — I10 Essential (primary) hypertension: Secondary | ICD-10-CM | POA: Diagnosis present

## 2015-08-07 DIAGNOSIS — E871 Hypo-osmolality and hyponatremia: Secondary | ICD-10-CM | POA: Diagnosis present

## 2015-08-07 DIAGNOSIS — R0902 Hypoxemia: Secondary | ICD-10-CM | POA: Diagnosis present

## 2015-08-07 DIAGNOSIS — E1122 Type 2 diabetes mellitus with diabetic chronic kidney disease: Secondary | ICD-10-CM | POA: Diagnosis present

## 2015-08-07 DIAGNOSIS — R1310 Dysphagia, unspecified: Secondary | ICD-10-CM

## 2015-08-07 LAB — BASIC METABOLIC PANEL
Anion gap: 10 (ref 5–15)
BUN: 16 mg/dL (ref 6–20)
CO2: 21 mmol/L — AB (ref 22–32)
CREATININE: 1.05 mg/dL — AB (ref 0.44–1.00)
Calcium: 9.8 mg/dL (ref 8.9–10.3)
Chloride: 106 mmol/L (ref 101–111)
GFR calc non Af Amer: 55 mL/min — ABNORMAL LOW (ref >60)
Glucose, Bld: 144 mg/dL — ABNORMAL HIGH (ref 65–99)
Potassium: 4 mmol/L (ref 3.5–5.1)
Sodium: 137 mmol/L (ref 135–145)

## 2015-08-07 LAB — URINALYSIS, ROUTINE W REFLEX MICROSCOPIC
Bilirubin Urine: NEGATIVE
Glucose, UA: NEGATIVE mg/dL
Hgb urine dipstick: NEGATIVE
Ketones, ur: NEGATIVE mg/dL
Leukocytes, UA: NEGATIVE
Nitrite: NEGATIVE
Protein, ur: NEGATIVE mg/dL
Specific Gravity, Urine: 1.012 (ref 1.005–1.030)
Urobilinogen, UA: 0.2 mg/dL (ref 0.0–1.0)
pH: 7 (ref 5.0–8.0)

## 2015-08-07 LAB — I-STAT CG4 LACTIC ACID, ED: Lactic Acid, Venous: 1.09 mmol/L (ref 0.5–2.0)

## 2015-08-07 LAB — CBC
HCT: 39.8 % (ref 36.0–46.0)
Hemoglobin: 12.4 g/dL (ref 12.0–15.0)
MCH: 21.8 pg — AB (ref 26.0–34.0)
MCHC: 31.2 g/dL (ref 30.0–36.0)
MCV: 69.9 fL — AB (ref 78.0–100.0)
PLATELETS: 444 10*3/uL — AB (ref 150–400)
RBC: 5.69 MIL/uL — AB (ref 3.87–5.11)
RDW: 18.6 % — ABNORMAL HIGH (ref 11.5–15.5)
WBC: 12.6 10*3/uL — ABNORMAL HIGH (ref 4.0–10.5)

## 2015-08-07 LAB — I-STAT TROPONIN, ED: TROPONIN I, POC: 0 ng/mL (ref 0.00–0.08)

## 2015-08-07 MED ORDER — ONDANSETRON HCL 4 MG/2ML IJ SOLN
4.0000 mg | Freq: Once | INTRAMUSCULAR | Status: AC
  Administered 2015-08-07: 4 mg via INTRAVENOUS
  Filled 2015-08-07: qty 2

## 2015-08-07 MED ORDER — ALBUTEROL (5 MG/ML) CONTINUOUS INHALATION SOLN
10.0000 mg/h | INHALATION_SOLUTION | RESPIRATORY_TRACT | Status: AC
  Administered 2015-08-07: 10 mg/h via RESPIRATORY_TRACT
  Filled 2015-08-07: qty 20

## 2015-08-07 MED ORDER — DEXTROSE 5 % IV SOLN
500.0000 mg | Freq: Once | INTRAVENOUS | Status: AC
  Administered 2015-08-08: 500 mg via INTRAVENOUS
  Filled 2015-08-07: qty 500

## 2015-08-07 MED ORDER — CEFTRIAXONE SODIUM 1 G IJ SOLR
1.0000 g | Freq: Once | INTRAMUSCULAR | Status: AC
  Administered 2015-08-08: 1 g via INTRAVENOUS
  Filled 2015-08-07: qty 10

## 2015-08-07 NOTE — ED Notes (Signed)
Pt states she is having shortness of breath, epigastic pain, and nausea  Pt states she had some chest pain earlier this morning around 9 or 10 am but denies any now   Pt is actively vomiting in triage, green liquid  Pt states she has had a cough and congestion lately

## 2015-08-08 DIAGNOSIS — F17210 Nicotine dependence, cigarettes, uncomplicated: Secondary | ICD-10-CM | POA: Diagnosis present

## 2015-08-08 DIAGNOSIS — R0602 Shortness of breath: Secondary | ICD-10-CM | POA: Diagnosis present

## 2015-08-08 DIAGNOSIS — I5033 Acute on chronic diastolic (congestive) heart failure: Secondary | ICD-10-CM | POA: Diagnosis present

## 2015-08-08 DIAGNOSIS — J441 Chronic obstructive pulmonary disease with (acute) exacerbation: Secondary | ICD-10-CM | POA: Diagnosis present

## 2015-08-08 DIAGNOSIS — I482 Chronic atrial fibrillation: Secondary | ICD-10-CM | POA: Diagnosis not present

## 2015-08-08 DIAGNOSIS — R1310 Dysphagia, unspecified: Secondary | ICD-10-CM | POA: Diagnosis present

## 2015-08-08 DIAGNOSIS — I10 Essential (primary) hypertension: Secondary | ICD-10-CM

## 2015-08-08 DIAGNOSIS — E860 Dehydration: Secondary | ICD-10-CM | POA: Diagnosis present

## 2015-08-08 DIAGNOSIS — K449 Diaphragmatic hernia without obstruction or gangrene: Secondary | ICD-10-CM | POA: Diagnosis present

## 2015-08-08 DIAGNOSIS — Z8249 Family history of ischemic heart disease and other diseases of the circulatory system: Secondary | ICD-10-CM | POA: Diagnosis not present

## 2015-08-08 DIAGNOSIS — Z720 Tobacco use: Secondary | ICD-10-CM

## 2015-08-08 DIAGNOSIS — I4891 Unspecified atrial fibrillation: Secondary | ICD-10-CM | POA: Diagnosis present

## 2015-08-08 DIAGNOSIS — E785 Hyperlipidemia, unspecified: Secondary | ICD-10-CM | POA: Diagnosis present

## 2015-08-08 DIAGNOSIS — T501X5A Adverse effect of loop [high-ceiling] diuretics, initial encounter: Secondary | ICD-10-CM | POA: Diagnosis present

## 2015-08-08 DIAGNOSIS — J189 Pneumonia, unspecified organism: Secondary | ICD-10-CM | POA: Diagnosis present

## 2015-08-08 DIAGNOSIS — Z803 Family history of malignant neoplasm of breast: Secondary | ICD-10-CM | POA: Diagnosis not present

## 2015-08-08 DIAGNOSIS — E1122 Type 2 diabetes mellitus with diabetic chronic kidney disease: Secondary | ICD-10-CM | POA: Diagnosis present

## 2015-08-08 DIAGNOSIS — N183 Chronic kidney disease, stage 3 (moderate): Secondary | ICD-10-CM | POA: Diagnosis present

## 2015-08-08 DIAGNOSIS — E871 Hypo-osmolality and hyponatremia: Secondary | ICD-10-CM | POA: Diagnosis present

## 2015-08-08 DIAGNOSIS — I129 Hypertensive chronic kidney disease with stage 1 through stage 4 chronic kidney disease, or unspecified chronic kidney disease: Secondary | ICD-10-CM | POA: Diagnosis present

## 2015-08-08 DIAGNOSIS — R0902 Hypoxemia: Secondary | ICD-10-CM | POA: Diagnosis present

## 2015-08-08 LAB — BASIC METABOLIC PANEL
ANION GAP: 10 (ref 5–15)
BUN: 15 mg/dL (ref 6–20)
CALCIUM: 9.9 mg/dL (ref 8.9–10.3)
CHLORIDE: 104 mmol/L (ref 101–111)
CO2: 22 mmol/L (ref 22–32)
CREATININE: 0.77 mg/dL (ref 0.44–1.00)
GLUCOSE: 142 mg/dL — AB (ref 65–99)
POTASSIUM: 3.9 mmol/L (ref 3.5–5.1)
Sodium: 136 mmol/L (ref 135–145)

## 2015-08-08 LAB — CBC
HCT: 40.1 % (ref 36.0–46.0)
Hemoglobin: 12.8 g/dL (ref 12.0–15.0)
MCH: 22.1 pg — ABNORMAL LOW (ref 26.0–34.0)
MCHC: 31.9 g/dL (ref 30.0–36.0)
MCV: 69.1 fL — ABNORMAL LOW (ref 78.0–100.0)
PLATELETS: 388 10*3/uL (ref 150–400)
RBC: 5.8 MIL/uL — ABNORMAL HIGH (ref 3.87–5.11)
RDW: 18.7 % — AB (ref 11.5–15.5)
WBC: 9.4 10*3/uL (ref 4.0–10.5)

## 2015-08-08 LAB — BLOOD GAS, ARTERIAL
Acid-base deficit: 4.9 mmol/L — ABNORMAL HIGH (ref 0.0–2.0)
Bicarbonate: 19 mEq/L — ABNORMAL LOW (ref 20.0–24.0)
Drawn by: 232811
O2 Content: 4 L/min
O2 Saturation: 90.9 %
Patient temperature: 98.5
TCO2: 17.2 mmol/L (ref 0–100)
pCO2 arterial: 33.2 mmHg — ABNORMAL LOW (ref 35.0–45.0)
pH, Arterial: 7.375 (ref 7.350–7.450)
pO2, Arterial: 69.1 mmHg — ABNORMAL LOW (ref 80.0–100.0)

## 2015-08-08 LAB — MRSA PCR SCREENING: MRSA BY PCR: NEGATIVE

## 2015-08-08 LAB — TROPONIN I: Troponin I: <0.03 ng/mL (ref <0.031)

## 2015-08-08 MED ORDER — VITAMIN B-12 1000 MCG PO TABS
1000.0000 ug | ORAL_TABLET | Freq: Every day | ORAL | Status: DC
  Filled 2015-08-08: qty 1

## 2015-08-08 MED ORDER — ENOXAPARIN SODIUM 40 MG/0.4ML ~~LOC~~ SOLN
40.0000 mg | SUBCUTANEOUS | Status: DC
  Administered 2015-08-08 – 2015-08-12 (×5): 40 mg via SUBCUTANEOUS
  Filled 2015-08-08 (×5): qty 0.4

## 2015-08-08 MED ORDER — FUROSEMIDE 20 MG PO TABS
20.0000 mg | ORAL_TABLET | Freq: Every day | ORAL | Status: DC
  Administered 2015-08-09 – 2015-08-10 (×2): 20 mg via ORAL
  Filled 2015-08-08 (×2): qty 1

## 2015-08-08 MED ORDER — HYDRALAZINE HCL 20 MG/ML IJ SOLN: 10.0000 mg | Freq: Four times a day (QID) | INTRAMUSCULAR | Status: DC | PRN

## 2015-08-08 MED ORDER — NICOTINE 7 MG/24HR TD PT24
7.0000 mg | MEDICATED_PATCH | Freq: Every day | TRANSDERMAL | Status: DC
  Administered 2015-08-08 – 2015-08-12 (×5): 7 mg via TRANSDERMAL
  Filled 2015-08-08 (×5): qty 1

## 2015-08-08 MED ORDER — ACETAMINOPHEN 325 MG PO TABS: 650.0000 mg | ORAL_TABLET | Freq: Four times a day (QID) | ORAL | Status: DC | PRN

## 2015-08-08 MED ORDER — ONDANSETRON HCL 4 MG PO TABS
4.0000 mg | ORAL_TABLET | Freq: Four times a day (QID) | ORAL | Status: DC | PRN
Start: 2015-08-08 — End: 2015-08-12

## 2015-08-08 MED ORDER — PANTOPRAZOLE SODIUM 40 MG PO TBEC
40.0000 mg | DELAYED_RELEASE_TABLET | Freq: Two times a day (BID) | ORAL | Status: DC
  Administered 2015-08-08 – 2015-08-12 (×9): 40 mg via ORAL
  Filled 2015-08-08 (×11): qty 1

## 2015-08-08 MED ORDER — CALCIUM CARBONATE-VITAMIN D 500-200 MG-UNIT PO TABS
1.0000 | ORAL_TABLET | Freq: Every day | ORAL | Status: DC
  Administered 2015-08-08 – 2015-08-12 (×5): 1 via ORAL
  Filled 2015-08-08 (×5): qty 1

## 2015-08-08 MED ORDER — ALUM & MAG HYDROXIDE-SIMETH 200-200-20 MG/5ML PO SUSP: 30.0000 mL | Freq: Four times a day (QID) | ORAL | Status: DC | PRN

## 2015-08-08 MED ORDER — MONTELUKAST SODIUM 10 MG PO TABS
10.0000 mg | ORAL_TABLET | Freq: Every day | ORAL | Status: DC
  Administered 2015-08-08 – 2015-08-11 (×4): 10 mg via ORAL
  Filled 2015-08-08 (×5): qty 1

## 2015-08-08 MED ORDER — AMIODARONE HCL 200 MG PO TABS
200.0000 mg | ORAL_TABLET | Freq: Every day | ORAL | Status: DC
  Administered 2015-08-08 – 2015-08-12 (×5): 200 mg via ORAL
  Filled 2015-08-08 (×5): qty 1

## 2015-08-08 MED ORDER — DEXTROSE 5 % IV SOLN
500.0000 mg | INTRAVENOUS | Status: DC
  Administered 2015-08-09 – 2015-08-10 (×2): 500 mg via INTRAVENOUS
  Filled 2015-08-08 (×2): qty 500

## 2015-08-08 MED ORDER — FUROSEMIDE 10 MG/ML IJ SOLN
40.0000 mg | Freq: Once | INTRAMUSCULAR | Status: AC
  Administered 2015-08-08: 40 mg via INTRAVENOUS
  Filled 2015-08-08: qty 4

## 2015-08-08 MED ORDER — PROMETHAZINE HCL 25 MG/ML IJ SOLN
12.5000 mg | Freq: Once | INTRAMUSCULAR | Status: AC
  Administered 2015-08-08: 12.5 mg via INTRAVENOUS
  Filled 2015-08-08: qty 1

## 2015-08-08 MED ORDER — AMLODIPINE BESYLATE 5 MG PO TABS
5.0000 mg | ORAL_TABLET | Freq: Every day | ORAL | Status: DC
  Administered 2015-08-08 – 2015-08-12 (×5): 5 mg via ORAL
  Filled 2015-08-08 (×6): qty 1

## 2015-08-08 MED ORDER — LOSARTAN POTASSIUM 25 MG PO TABS
25.0000 mg | ORAL_TABLET | Freq: Every day | ORAL | Status: DC
  Filled 2015-08-08: qty 1

## 2015-08-08 MED ORDER — DEXTROSE 5 % IV SOLN
1.0000 g | INTRAVENOUS | Status: DC
  Administered 2015-08-09 (×2): 1 g via INTRAVENOUS
  Filled 2015-08-08 (×2): qty 10

## 2015-08-08 MED ORDER — ACETAMINOPHEN 650 MG RE SUPP: 650.0000 mg | Freq: Four times a day (QID) | RECTAL | Status: DC | PRN

## 2015-08-08 MED ORDER — METOPROLOL TARTRATE 1 MG/ML IV SOLN
5.0000 mg | INTRAVENOUS | Status: DC | PRN
  Administered 2015-08-08: 5 mg via INTRAVENOUS
  Filled 2015-08-08: qty 5

## 2015-08-08 MED ORDER — SODIUM CHLORIDE 0.9 % IV SOLN
INTRAVENOUS | Status: DC
  Administered 2015-08-08: 11:00:00 via INTRAVENOUS

## 2015-08-08 MED ORDER — ONDANSETRON HCL 4 MG/2ML IJ SOLN
4.0000 mg | Freq: Four times a day (QID) | INTRAMUSCULAR | Status: DC | PRN
  Administered 2015-08-08 – 2015-08-11 (×5): 4 mg via INTRAVENOUS
  Filled 2015-08-08 (×5): qty 2

## 2015-08-08 MED ORDER — DOCUSATE SODIUM 100 MG PO CAPS
100.0000 mg | ORAL_CAPSULE | Freq: Every day | ORAL | Status: DC | PRN
  Administered 2015-08-10: 100 mg via ORAL
  Filled 2015-08-08: qty 1

## 2015-08-08 MED ORDER — HYDROMORPHONE HCL 1 MG/ML IJ SOLN
0.5000 mg | INTRAMUSCULAR | Status: DC | PRN
  Administered 2015-08-08 – 2015-08-09 (×4): 1 mg via INTRAVENOUS
  Administered 2015-08-09 (×2): 0.5 mg via INTRAVENOUS
  Administered 2015-08-09: 1 mg via INTRAVENOUS
  Administered 2015-08-10 (×4): 0.5 mg via INTRAVENOUS
  Administered 2015-08-11 – 2015-08-12 (×6): 1 mg via INTRAVENOUS
  Filled 2015-08-08 (×18): qty 1

## 2015-08-08 MED ORDER — METOPROLOL SUCCINATE ER 25 MG PO TB24
25.0000 mg | ORAL_TABLET | Freq: Every day | ORAL | Status: DC
  Administered 2015-08-09 – 2015-08-12 (×4): 25 mg via ORAL
  Filled 2015-08-08 (×4): qty 1

## 2015-08-08 MED ORDER — DILTIAZEM HCL 25 MG/5ML IV SOLN: 10.0000 mg | Freq: Four times a day (QID) | INTRAVENOUS | Status: DC | PRN

## 2015-08-08 MED ORDER — NICOTINE 7 MG/24HR TD PT24: 7.0000 mg | MEDICATED_PATCH | Freq: Every day | TRANSDERMAL | Status: DC

## 2015-08-08 MED ORDER — POTASSIUM CHLORIDE CRYS ER 20 MEQ PO TBCR
20.0000 meq | EXTENDED_RELEASE_TABLET | Freq: Two times a day (BID) | ORAL | Status: DC
  Administered 2015-08-08 – 2015-08-10 (×5): 20 meq via ORAL
  Filled 2015-08-08 (×5): qty 1

## 2015-08-08 MED ORDER — OXYCODONE HCL 5 MG PO TABS
5.0000 mg | ORAL_TABLET | ORAL | Status: DC | PRN
  Filled 2015-08-08: qty 1

## 2015-08-08 MED ORDER — CETYLPYRIDINIUM CHLORIDE 0.05 % MT LIQD
7.0000 mL | Freq: Two times a day (BID) | OROMUCOSAL | Status: DC
  Administered 2015-08-08 – 2015-08-11 (×7): 7 mL via OROMUCOSAL

## 2015-08-08 NOTE — Progress Notes (Addendum)
Patient seen and examined this morning  Admitted for community-acquired pneumonia, atrial fibrillation with rapid ventricular response, hypoxemic respiratory failure requiring 4 L of oxygen  Patient is hypertensive, tachycardic, chest x-ray shows probably vascular congestion  Most recent 2-D echo on 03/03/12 showed EF of 55-60%  Plan #1 continue antibiotics, IV steroids for COPD exacerbation/ probable CAP  #2 Acute on  chronic diastolic heart failure, will repeat 2-D echo to further assess, will give 1 dose of IV Lasix #3 atrial fibrillation with rapid ventricular response-continue to cycle cardiac enzymes, continue amiodarone, treat underlying pneumonia, IV metoprolol push for heart rate greater than 120, start patient on Toprol-XL , patient previously on metoprolol and 2015.Not a candidate for anticoagulation due to recurrent GI Bleed per cardiology notes from 2015

## 2015-08-08 NOTE — ED Notes (Signed)
Pt transferred to room 1238.  Seward Speck receiving RN.  One white bag of belongings and one black pocket book to room with patient.  No family with patient at the time of transfer.  SBAR reviewed, all questions answered

## 2015-08-08 NOTE — H&P (Signed)
[facility] Admission History and Physical       MARET DIAZ NMM:[id number] DOB: 05-11-1951 DOA: 08/07/2015  Referring physician: EDP PCP: [name], MD  Specialists:   Chief Complaint: SOB and Cough and Fevers and Chills  HPI: [name] is a 64 y.o. female with a history of COPD, HTN, CHF,Atrial fibrillation, and DM2 who presents to the ED with complaints of SOB, Cough and Fevers and Chills x 1 day.   She reports having wheezing off and on x 1 week.  She has been coughing up whitish sputum.   She has also had nausea and vomiting but no diarrhea.   In the ED she was hypoxic to 83% and was placed on 4 liters NCO2.  She was also found to have fever to 101.5.   A Chest X-ray was performed and revealed Interstitial Pneumonia and Mild Vascular congestion.   She was placed on IV antibiotics for CAP pneumonia and referred for admission.      Review of Systems:  Constitutional: No Weight Loss, No Weight Gain, Night Sweats, Fevers, Chills, Dizziness, Light Headedness, Fatigue, or Generalized Weakness HEENT: No Headaches, Difficulty Swallowing,Tooth/Dental Problems,Sore Throat,  No Sneezing, Rhinitis, Ear Ache, Nasal Congestion, or Post Nasal Drip,  Cardio-vascular:  No Chest pain, Orthopnea, PND, Edema in Lower Extremities, Anasarca, Dizziness, Palpitations  Resp: No Dyspnea, No DOE, No Productive Cough, No Non-Productive Cough, No Hemoptysis, No Wheezing.    GI: No Heartburn, Indigestion, Abdominal Pain, Nausea, Vomiting, Diarrhea, Constipation, Hematemesis, Hematochezia, Melena, Change in Bowel Habits,  Loss of Appetite  GU: No Dysuria, No Change in Color of Urine, No Urgency or Urinary Frequency, No Flank pain.  Musculoskeletal: No Joint Pain or Swelling, No Decreased Range of Motion, No Back Pain.  Neurologic: No Syncope, No Seizures, Muscle Weakness, Paresthesia, Vision Disturbance or Loss, No Diplopia, No Vertigo, No Difficulty Walking,  Skin: No Rash or  Lesions. Psych: No Change in Mood or Affect, No Depression or Anxiety, No Memory loss, No Confusion, or Hallucinations   Past Medical History  Diagnosis Date  . Hypertension   . Borderline diabetes   . Irregular heart beat   . Hyperlipidemia   . CHF (congestive heart failure)   . CKD (chronic kidney disease) stage 3, GFR 30-59 ml/min   . Dysrhythmia     A-fib  . Anxiety   . Depression   . Shortness of breath     on exertion  . GERD (gastroesophageal reflux disease)   . Headache(784.0)   . Arthritis     knees  . Anemia      Past Surgical History  Procedure Laterality Date  . Hemorrhoid surgery    . Esophagogastroduodenoscopy N/A 02/25/2013    Procedure: ESOPHAGOGASTRODUODENOSCOPY (EGD);  Surgeon: Theda Belfast, MD;  Location: [facility];  Service: Endoscopy;  Laterality: N/A;  . Colonoscopy N/A 02/25/2013    Procedure: COLONOSCOPY;  Surgeon: Theda Belfast, MD;  Location: [facility];  Service: Endoscopy;  Laterality: N/A;  . Givens capsule study N/A 02/26/2013    Procedure: GIVENS CAPSULE STUDY;  Surgeon: Theda Belfast, MD;  Location: [facility];  Service: Endoscopy;  Laterality: N/A;      Prior to Admission medications   Medication Sig Start Date End Date Taking? Authorizing Provider  acetaminophen (TYLENOL) 500 MG tablet Take 1,000 mg by mouth daily as needed for mild pain (tooth pain).    Yes Historical Provider, MD  albuterol (PROVENTIL HFA;VENTOLIN HFA) 108 (90 BASE) MCG/ACT inhaler Inhale 1-2 puffs  into the lungs every 6 (six) hours as needed for wheezing or shortness of breath.   Yes Historical Provider, MD  amiodarone (PACERONE) 200 MG tablet Take 200 mg by mouth daily.   Yes Historical Provider, MD  amLODipine (NORVASC) 5 MG tablet Take 5 mg by mouth daily.   Yes Historical Provider, MD  Calcium Carb-Cholecalciferol (CALCIUM + D3 PO) Take 1 tablet by mouth daily.   Yes Historical Provider, MD  CALCIUM-MAGNESIUM-ZINC PO Take 1 tablet by mouth daily.   Yes  Historical Provider, MD  docusate sodium (COLACE) 100 MG capsule Take 100 mg by mouth daily as needed for mild constipation (constipation).    Yes Historical Provider, MD  furosemide (LASIX) 40 MG tablet Take 0.5 tablets (20 mg total) by mouth daily. 02/28/13  Yes Kathlen Mody, MD  guaifenesin (MUCUS RELIEF CHEST CONGESTION) 400 MG TABS tablet Take 400 mg by mouth every 4 (four) hours as needed (cough).   Yes Historical Provider, MD  HYDROcodone-acetaminophen (NORCO/VICODIN) 5-325 MG per tablet Take 1 tablet by mouth every 4 (four) hours as needed for moderate pain (pain).   Yes Historical Provider, MD  losartan (COZAAR) 25 MG tablet Take 25 mg by mouth daily.   Yes Historical Provider, MD  montelukast (SINGULAIR) 10 MG tablet Take 10 mg by mouth daily.   Yes Historical Provider, MD  pantoprazole (PROTONIX) 40 MG tablet Take 40 mg by mouth 2 (two) times daily.   Yes Historical Provider, MD  potassium chloride SA (K-DUR,KLOR-CON) 20 MEQ tablet Take 1 tablet (20 mEq total) by mouth 2 (two) times daily. 05/26/12  Yes Richarda Overlie, MD  vitamin B-12 (CYANOCOBALAMIN) 1000 MCG tablet Take 1,000 mcg by mouth daily.   Yes Historical Provider, MD  clindamycin (CLEOCIN) 150 MG capsule Take 2 capsules (300 mg total) by mouth 3 (three) times daily. Patient not taking: Reported on 10/30/2014 09/18/14   Charlestine Night, PA-C  oxyCODONE-acetaminophen (PERCOCET/ROXICET) 5-325 MG per tablet Take 1 tablet by mouth every 6 (six) hours as needed for severe pain. Patient not taking: Reported on 08/07/2015 09/18/14   Charlestine Night, PA-C     Allergies  Allergen Reactions  . Levaquin [Levofloxacin] Hives  . Penicillins Swelling    Social History:  reports that she has been smoking Cigarettes.  She has a 11.25 pack-year smoking history. She uses smokeless tobacco. She reports that she uses illicit drugs (Marijuana). She reports that she does not drink alcohol.    Family History  Problem Relation Age of Onset  .  Kidney disease Mother   . Heart attack Mother   . Kidney disease Father   . Hypertension Mother   . Breast cancer Daughter   . Heart disease Brother        Physical Exam:  GEN:  Pleasant Thin Elderly 64 y.o. African American female examined and in no acute distress; cooperative with exam Filed Vitals:   08/07/15 2350 08/07/15 2351 08/07/15 2352 08/07/15 2353  BP: 151/85     Pulse: 113 111 111   Temp:      TempSrc:      Resp: 26 27 22    SpO2: 84% 89% 91% 94%   Blood pressure 151/85, pulse 111, temperature 97.8 F (36.6 C), temperature source Oral, resp. rate 22, SpO2 94 %. PSYCH: She is alert and oriented x4; does not appear anxious does not appear depressed; affect is normal HEENT: Normocephalic and Atraumatic, Mucous membranes pink; PERRLA; EOM intact; Fundi:  Benign;  No scleral icterus, Nares: Patent, Oropharynx:  Clear,sparse Dentition,    Neck:  FROM, No Cervical Lymphadenopathy nor Thyromegaly or Carotid Bruit; No JVD; Breasts:: Not examined CHEST WALL: No tenderness CHEST: Decreased Rhonchorous Breath sounds, with Diffuse Expiratory Wheezes.    HEART: Irregular rate and rhythm; no murmurs rubs or gallops BACK: No kyphosis or scoliosis; No CVA tenderness ABDOMEN: Positive Bowel Sounds, Scaphoid, Soft Non-Tender, No Rebound or Guarding; No Masses, No Organomegaly Rectal Exam: Not done EXTREMITIES: No Cyanosis, Clubbing, or Edema; No Ulcerations. Genitalia: not examined PULSES: 2+ and symmetric SKIN: Normal hydration no rash or ulceration CNS:  Alert and Oriented x 4, No Focal Deficits Vascular: pulses palpable throughout    Labs on Admission:  Basic Metabolic Panel:  Recent Labs Lab 08/07/15 2240  NA 137  K 4.0  CL 106  CO2 21*  GLUCOSE 144*  BUN 16  CREATININE 1.05*  CALCIUM 9.8   Liver Function Tests: No results for input(s): AST, ALT, ALKPHOS, BILITOT, PROT, ALBUMIN in the last 168 hours. No results for input(s): LIPASE, AMYLASE in the last 168  hours. No results for input(s): AMMONIA in the last 168 hours. CBC:  Recent Labs Lab 08/07/15 2240  WBC 12.6*  HGB 12.4  HCT 39.8  MCV 69.9*  PLT 444*   Cardiac Enzymes: No results for input(s): CKTOTAL, CKMB, CKMBINDEX, TROPONINI in the last 168 hours.  BNP (last 3 results) No results for input(s): BNP in the last 8760 hours.  ProBNP (last 3 results) No results for input(s): PROBNP in the last 8760 hours.  CBG: No results for input(s): GLUCAP in the last 168 hours.  Radiological Exams on Admission: Dg Chest 2 View  08/07/2015   CLINICAL DATA:  Cough, congestion and weakness with chest pain, nausea and vomiting 2 days.  EXAM: CHEST  2 VIEW  COMPARISON:  09/13/2014  FINDINGS: Lungs are adequately inflated with minimal prominence of the perihilar markings and subtle basilar interstitial prominence. No lobar consolidation or effusion. Mild stable cardiomegaly. Mild degenerative change of the spine.  IMPRESSION: Findings suggesting mild vascular congestion or possible interstitial pneumonia.   Electronically Signed   By: Elberta Fortis M.D.   On: 08/07/2015 22:44     EKG: Independently reviewed. Atrial Fibrillation rate =109, +RAD, No acute S-T changes   Assessment/Plan:   64 y.o. female with  Principal Problem:   1.     CAP (community acquired pneumonia)   IV Rocephin and Azithromycin   DUONebs   O2 PRN   Monitor O2 sats   Active Problems:   2.    COPD exacerbation   DuoNebs   IV Steroids   O2 PRN   Monitor O2 sats   Continue Singulair        3.    Chronic diastolic heart failure   Continue Lasix 20 mg PO q day   ContinueLosartan Rx   Strict I/Os and daily weights        4.    Atrial fibrillation   Cardiac Monitoring   Continue Amiodarone Rx         5.    Essential hypertension   Continue Lasix and Losartan Rx as BP tolerates     6.    CKD (chronic kidney disease) stage 3, GFR 30-59 ml/min   Monitor BUN/Cr     7.    TOBACCO USER   Nicotine Patch 7  mg daily     8.    DVT Prophylaxis   Lovenox  Code Status:     FULL CODE      Family Communication:   Daughter at Bedside   Disposition Plan:    Inpatient Status        Time spent:  73 Minutes      Ron Parker [facility] Pager [phone]   If 7AM -7PM Please Contact the Day Rounding Team MD for [facility]  If 7PM-7AM, Please Contact Night-Floor Coverage  [email or web address] Password [email or web address] 08/08/2015, 1:19 AM     ADDENDUM:   Patient was seen and examined on 08/08/2015

## 2015-08-08 NOTE — ED Provider Notes (Signed)
CSN: [id number]     Arrival date & time 08/07/15  2138 History   First MD Initiated Contact with Patient 08/07/15 2207     Chief Complaint  Patient presents with  . Abdominal Pain  . Nausea     (Consider location/radiation/quality/duration/timing/severity/associated sxs/prior Treatment) HPI Patient presents to the emergency department with shortness of breath, epigastric pain and nausea.  Patient states earlier she did have some chest pain but that is resolved.  The patient does have some vomiting here in the emergency department.  She has had a cough with chest congestion recently.  Patient states that nothing seems make her condition better ambulation makes her shortness of breath, worse.  The patient states that she does not have any  fever, weakness, dizziness, headache, blurred vision, back pain, neck pain, dysuria, incontinence, hematemesis, bloody stool.  The patient states that she did not take any medications other and her prescribed medicines prior to arrival Past Medical History  Diagnosis Date  . Hypertension   . Borderline diabetes   . Irregular heart beat   . Hyperlipidemia   . CHF (congestive heart failure)   . CKD (chronic kidney disease) stage 3, GFR 30-59 ml/min   . Dysrhythmia     A-fib  . Anxiety   . Depression   . Shortness of breath     on exertion  . GERD (gastroesophageal reflux disease)   . Headache(784.0)   . Arthritis     knees  . Anemia    Past Surgical History  Procedure Laterality Date  . Hemorrhoid surgery    . Esophagogastroduodenoscopy N/A 02/25/2013    Procedure: ESOPHAGOGASTRODUODENOSCOPY (EGD);  Surgeon: Theda Belfast, MD;  Location: [facility];  Service: Endoscopy;  Laterality: N/A;  . Colonoscopy N/A 02/25/2013    Procedure: COLONOSCOPY;  Surgeon: Theda Belfast, MD;  Location: [facility];  Service: Endoscopy;  Laterality: N/A;  . Givens capsule study N/A 02/26/2013    Procedure: GIVENS CAPSULE STUDY;  Surgeon: Theda Belfast, MD;   Location: [facility];  Service: Endoscopy;  Laterality: N/A;   Family History  Problem Relation Age of Onset  . Kidney disease Mother   . Heart attack Mother   . Kidney disease Father   . Hypertension Mother   . Breast cancer Daughter   . Heart disease Brother    Social History  Substance Use Topics  . Smoking status: Current Every Day Smoker -- 0.25 packs/day for 45 years    Types: Cigarettes  . Smokeless tobacco: Current User  . Alcohol Use: No   OB History    No data available     Review of Systems All other systems negative except as documented in the HPI. All pertinent positives and negatives as reviewed in the HPI.   Allergies  Levaquin and Penicillins  Home Medications   Prior to Admission medications   Medication Sig Start Date End Date Taking? Authorizing Provider  acetaminophen (TYLENOL) 500 MG tablet Take 1,000 mg by mouth daily as needed for mild pain (tooth pain).    Yes Historical Provider, MD  albuterol (PROVENTIL HFA;VENTOLIN HFA) 108 (90 BASE) MCG/ACT inhaler Inhale 1-2 puffs into the lungs every 6 (six) hours as needed for wheezing or shortness of breath.   Yes Historical Provider, MD  amiodarone (PACERONE) 200 MG tablet Take 200 mg by mouth daily.   Yes Historical Provider, MD  amLODipine (NORVASC) 5 MG tablet Take 5 mg by mouth daily.   Yes Historical Provider, MD  Calcium  Carb-Cholecalciferol (CALCIUM + D3 PO) Take 1 tablet by mouth daily.   Yes Historical Provider, MD  CALCIUM-MAGNESIUM-ZINC PO Take 1 tablet by mouth daily.   Yes Historical Provider, MD  docusate sodium (COLACE) 100 MG capsule Take 100 mg by mouth daily as needed for mild constipation (constipation).    Yes Historical Provider, MD  furosemide (LASIX) 40 MG tablet Take 0.5 tablets (20 mg total) by mouth daily. 02/28/13  Yes Kathlen Mody, MD  guaifenesin (MUCUS RELIEF CHEST CONGESTION) 400 MG TABS tablet Take 400 mg by mouth every 4 (four) hours as needed (cough).   Yes Historical  Provider, MD  HYDROcodone-acetaminophen (NORCO/VICODIN) 5-325 MG per tablet Take 1 tablet by mouth every 4 (four) hours as needed for moderate pain (pain).   Yes Historical Provider, MD  losartan (COZAAR) 25 MG tablet Take 25 mg by mouth daily.   Yes Historical Provider, MD  montelukast (SINGULAIR) 10 MG tablet Take 10 mg by mouth daily.   Yes Historical Provider, MD  pantoprazole (PROTONIX) 40 MG tablet Take 40 mg by mouth 2 (two) times daily.   Yes Historical Provider, MD  potassium chloride SA (K-DUR,KLOR-CON) 20 MEQ tablet Take 1 tablet (20 mEq total) by mouth 2 (two) times daily. 05/26/12  Yes Richarda Overlie, MD  vitamin B-12 (CYANOCOBALAMIN) 1000 MCG tablet Take 1,000 mcg by mouth daily.   Yes Historical Provider, MD  clindamycin (CLEOCIN) 150 MG capsule Take 2 capsules (300 mg total) by mouth 3 (three) times daily. Patient not taking: Reported on 10/30/2014 09/18/14   Charlestine Night, PA-C  oxyCODONE-acetaminophen (PERCOCET/ROXICET) 5-325 MG per tablet Take 1 tablet by mouth every 6 (six) hours as needed for severe pain. Patient not taking: Reported on 08/07/2015 09/18/14   Charlestine Night, PA-C   BP 151/85 mmHg  Pulse 111  Temp(Src) 97.8 F (36.6 C) (Oral)  Resp 22  SpO2 94% Physical Exam  Constitutional: She is oriented to person, place, and time. She appears well-developed and well-nourished. No distress.  HENT:  Head: Normocephalic and atraumatic.  Mouth/Throat: Oropharynx is clear and moist.  Eyes: Pupils are equal, round, and reactive to light.  Neck: Normal range of motion. Neck supple.  Cardiovascular: Regular rhythm and normal heart sounds.  Tachycardia present.  Exam reveals no gallop and no friction rub.   No murmur heard. Pulmonary/Chest: She is in respiratory distress. She has wheezes. She has no rales. She exhibits no tenderness.  Musculoskeletal: She exhibits no edema.  Neurological: She is alert and oriented to person, place, and time. She exhibits normal muscle  tone. Coordination normal.  Skin: Skin is warm and dry. No rash noted. No erythema.  Psychiatric: She has a normal mood and affect. Her behavior is normal.  Nursing note and vitals reviewed.   ED Course  Procedures (including critical care time) Labs Review Labs Reviewed  BASIC METABOLIC PANEL - Abnormal; Notable for the following:    CO2 21 (*)    Glucose, Bld 144 (*)    Creatinine, Ser 1.05 (*)    GFR calc non Af Amer 55 (*)    All other components within normal limits  CBC - Abnormal; Notable for the following:    WBC 12.6 (*)    RBC 5.69 (*)    MCV 69.9 (*)    MCH 21.8 (*)    RDW 18.6 (*)    Platelets 444 (*)    All other components within normal limits  URINALYSIS, ROUTINE W REFLEX MICROSCOPIC (NOT AT [facility]) - Abnormal; Notable  for the following:    APPearance CLOUDY (*)    All other components within normal limits  CULTURE, BLOOD (ROUTINE X 2)  CULTURE, BLOOD (ROUTINE X 2)  I-STAT CG4 LACTIC ACID, ED  Rosezena Sensor, ED    Imaging Review Dg Chest 2 View  08/07/2015   CLINICAL DATA:  Cough, congestion and weakness with chest pain, nausea and vomiting 2 days.  EXAM: CHEST  2 VIEW  COMPARISON:  09/13/2014  FINDINGS: Lungs are adequately inflated with minimal prominence of the perihilar markings and subtle basilar interstitial prominence. No lobar consolidation or effusion. Mild stable cardiomegaly. Mild degenerative change of the spine.  IMPRESSION: Findings suggesting mild vascular congestion or possible interstitial pneumonia.   Electronically Signed   By: Elberta Fortis M.D.   On: 08/07/2015 22:44   I have personally reviewed and evaluated these images and lab results as part of my medical decision-making.   EKG Interpretation   Date/Time:  Tuesday August 07 2015 21:57:26 EDT Ventricular Rate:  109 PR Interval:    QRS Duration: 84 QT Interval:  365 QTC Calculation: 491 R Axis:   86 Text Interpretation:  Atrial fibrillation Ventricular premature complex   Borderline right axis deviation Borderline repolarization abnormality  Borderline prolonged QT interval Nonspecific ST and T wave abnormality no  acuite changes Confirmed by Rhunette Croft, MD, Janey Genta [phone]) on 08/08/2015  1:35:07 AM      Patient be admitted to hospital for further evaluation and care of this possible pneumonia.  She was advised the plan and all questions were answered   Charlestine Night, PA-C 08/11/15 0105  Lorre Nick, MD 08/14/15 1227

## 2015-08-08 NOTE — ED Notes (Signed)
Assisted pt to bedside commode, voided x 1. Increase in dyspnea but tolerated ok. Placed back on a hospital bed. Cardiac monitor afib 100-120s. Satting 92-94% on 4lpm n/c with expiratory wheezing. Nausea, vomiting and abd pain resolved after phenergan given.

## 2015-08-08 NOTE — Care Management Note (Signed)
Case Management Note  Patient Details  Name: [name] MRN: [id number] Date of Birth: [birth year]  Subjective/Objective:          resp failure 02 at 40%          Action/Plan:Date:  Sept. 14, 2016 U.R. performed for needs and level of care. Will continue to follow for Case Management needs.  Marcelle Smiling, RN, BSN, [place]   [phone]   Expected Discharge Date:   (UNKNOWN)               Expected Discharge Plan:  Home/Self Care  In-House Referral:  NA  Discharge planning Services  CM Consult  Post Acute Care Choice:  NA Choice offered to:  NA  DME Arranged:    DME Agency:     HH Arranged:    HH Agency:     Status of Service:  In process, will continue to follow  Medicare Important Message Given:    Date Medicare IM Given:    Medicare IM give by:    Date Additional Medicare IM Given:    Additional Medicare Important Message give by:     If discussed at Long Length of Stay Meetings, dates discussed:    Additional Comments:  [name], RN 08/08/2015, 11:26 AM

## 2015-08-08 NOTE — ED Notes (Signed)
Pt with 2 episodes of emesis. Small, clear.

## 2015-08-08 NOTE — ED Notes (Signed)
ICU/SD advised of delay in transporting patient.  We are attempting to find a holder for an oxygen tank because patient desats quickly off of 02

## 2015-08-09 ENCOUNTER — Inpatient Hospital Stay (HOSPITAL_COMMUNITY): Payer: Medicare Other

## 2015-08-09 LAB — TROPONIN I: Troponin I: <0.03 ng/mL (ref <0.031)

## 2015-08-09 LAB — LEGIONELLA ANTIGEN, URINE

## 2015-08-09 LAB — HIV ANTIBODY (ROUTINE TESTING W REFLEX): HIV SCREEN 4TH GENERATION: NONREACTIVE

## 2015-08-09 LAB — STREP PNEUMONIAE URINARY ANTIGEN: Strep Pneumo Urinary Antigen: POSITIVE — AB

## 2015-08-09 MED ORDER — METHYLPREDNISOLONE SODIUM SUCC 40 MG IJ SOLR
40.0000 mg | Freq: Two times a day (BID) | INTRAMUSCULAR | Status: DC
  Administered 2015-08-09 – 2015-08-10 (×3): 40 mg via INTRAVENOUS
  Filled 2015-08-09 (×4): qty 1

## 2015-08-09 NOTE — Progress Notes (Signed)
  Echocardiogram 2D Echocardiogram has been performed.  Arvil Chaco 08/09/2015, 2:11 PM

## 2015-08-09 NOTE — H&P (Deleted)
[facility] PROGRESS NOTE  [name] MEQ:[id number] DOB: 24-Oct-1951 DOA: 08/07/2015 PCP: [name], MD  Assessment/Plan: Principal Problem:   CAP (community acquired pneumonia) Active Problems:   TOBACCO USER   Essential hypertension   Atrial fibrillation   CKD (chronic kidney disease) stage 3, GFR 30-59 ml/min   Chronic diastolic heart failure   COPD exacerbation      Community acquired pneumonia-chest x-ray shows probable interstitial pneumonia, continue current antibiotics  Acute COPD exacerbation-active smoker, increased interstitial markings, likely has COPD, continue IV steroids  Atrial fibrillation-rate controlled on amiodarone, tachycardia improved overnight with IV hydration  Essential hypertension-increase Norvasc to 10 mg given uncontrolled blood pressure,  Acute on chronic diastolic heart failure, last 2-D echo was in 2013 that showed a need for 55-60%, continue Lasix 20 mg a day, repeat 2-D echo pending  CKD (chronic kidney disease) stage 3, GFR 30-59 ml/min-creatinine at baseline Monitor BUN/Cr  Nicotine dependence, continue nicotine patch  DVT prophylaxsis   Code Status:      Code Status Orders        Start     Ordered   08/08/15 0802  Full code   Continuous     08/08/15 0801     Family Communication: No family by the bedside Disposition Plan:  Transfer to telemetry today, anticipate discharge in one to 2 days   Brief narrative: [name] is a 64 y.o. female with a history of COPD, HTN, CHF,Atrial fibrillation, and DM2 who presents to the ED with complaints of SOB, Cough and Fevers and Chills x 1 day. She reports having wheezing off and on x 1 week. She has been coughing up whitish sputum. She has also had nausea and vomiting but no diarrhea. In the ED she was hypoxic to 83% and was placed on 4 liters NCO2. She was also found to have fever to 101.5. A Chest X-ray was performed and  revealed Interstitial Pneumonia and Mild Vascular congestion. She was placed on IV antibiotics for CAP pneumonia and referred for admission.  Consultants:  None  Procedures:  None  Antibiotics: Anti-infectives    Start     Dose/Rate Route Frequency Ordered Stop   08/09/15 0100  azithromycin (ZITHROMAX) 500 mg in dextrose 5 % 250 mL IVPB     500 mg 250 mL/hr over 60 Minutes Intravenous Every 24 hours 08/08/15 0801 08/16/15 0059   08/09/15 0000  cefTRIAXone (ROCEPHIN) 1 g in dextrose 5 % 50 mL IVPB     1 g 100 mL/hr over 30 Minutes Intravenous Every 24 hours 08/08/15 0801 08/15/15 2359   08/08/15 0000  cefTRIAXone (ROCEPHIN) 1 g in dextrose 5 % 50 mL IVPB     1 g 100 mL/hr over 30 Minutes Intravenous  Once 08/07/15 2358 08/08/15 0114   08/08/15 0000  azithromycin (ZITHROMAX) 500 mg in dextrose 5 % 250 mL IVPB     500 mg 250 mL/hr over 60 Minutes Intravenous  Once 08/07/15 2358 08/08/15 0230         HPI/Subjective: Patient awake alert oriented, feels better today, tachycardia resolved, denies any chest pain shortness of breath   Objective: Filed Vitals:   08/09/15 0800 08/09/15 0900 08/09/15 1000 08/09/15 1020  BP: 169/83  108/59 108/59  Pulse: 70 74 58 60  Temp: 98.1 F (36.7 C)     TempSrc: Oral     Resp: 16 14 14    Height:      Weight:  SpO2: 97% 97% 99%     Intake/Output Summary (Last 24 hours) at 08/09/15 1121 Last data filed at 08/09/15 0200  Gross per 24 hour  Intake    100 ml  Output   1150 ml  Net  -1050 ml    Exam:  General: No acute respiratory distress Lungs: Clear to auscultation bilaterally without wheezes or crackles Cardiovascular: Regular rate and rhythm without murmur gallop or rub normal S1 and S2 Abdomen: Nontender, nondistended, soft, bowel sounds positive, no rebound, no ascites, no appreciable mass Extremities: No significant cyanosis, clubbing, or edema bilateral lower extremities     Data Review   Micro Results Recent  Results (from the past 240 hour(s))  MRSA PCR Screening     Status: None   Collection Time: 08/08/15  9:00 AM  Result Value Ref Range Status   MRSA by PCR NEGATIVE NEGATIVE Final    Comment:        The GeneXpert MRSA Assay (FDA approved for NASAL specimens only), is one component of a comprehensive MRSA colonization surveillance program. It is not intended to diagnose MRSA infection nor to guide or monitor treatment for MRSA infections.     Radiology Reports Dg Chest 2 View  08/07/2015   CLINICAL DATA:  Cough, congestion and weakness with chest pain, nausea and vomiting 2 days.  EXAM: CHEST  2 VIEW  COMPARISON:  09/13/2014  FINDINGS: Lungs are adequately inflated with minimal prominence of the perihilar markings and subtle basilar interstitial prominence. No lobar consolidation or effusion. Mild stable cardiomegaly. Mild degenerative change of the spine.  IMPRESSION: Findings suggesting mild vascular congestion or possible interstitial pneumonia.   Electronically Signed   By: Elberta Fortis M.D.   On: 08/07/2015 22:44   Mm Digital Screening Bilateral  08/06/2015   CLINICAL DATA:  Screening.  EXAM: DIGITAL SCREENING BILATERAL MAMMOGRAM WITH CAD  COMPARISON:  Previous exam(s).  ACR Breast Density Category b: There are scattered areas of fibroglandular density.  FINDINGS: There are no findings suspicious for malignancy. Images were processed with CAD.  IMPRESSION: No mammographic evidence of malignancy. A result letter of this screening mammogram will be mailed directly to the patient.  RECOMMENDATION: Screening mammogram in one year. (Code:[id number])  BI-RADS CATEGORY  1: Negative.   Electronically Signed   By: Baird Lyons M.D.   On: 08/06/2015 12:09     CBC  Recent Labs Lab 08/07/15 2240 08/08/15 1004  WBC 12.6* 9.4  HGB 12.4 12.8  HCT 39.8 40.1  PLT 444* 388  MCV 69.9* 69.1*  MCH 21.8* 22.1*  MCHC 31.2 31.9  RDW 18.6* 18.7*    Chemistries   Recent Labs Lab 08/07/15 2240  08/08/15 1004  NA 137 136  K 4.0 3.9  CL 106 104  CO2 21* 22  GLUCOSE 144* 142*  BUN 16 15  CREATININE 1.05* 0.77  CALCIUM 9.8 9.9   ------------------------------------------------------------------------------------------------------------------ estimated creatinine clearance is 78.9 mL/min (by C-G formula based on Cr of 0.77). ------------------------------------------------------------------------------------------------------------------ No results for input(s): HGBA1C in the last 72 hours. ------------------------------------------------------------------------------------------------------------------ No results for input(s): CHOL, HDL, LDLCALC, TRIG, CHOLHDL, LDLDIRECT in the last 72 hours. ------------------------------------------------------------------------------------------------------------------ No results for input(s): TSH, T4TOTAL, T3FREE, THYROIDAB in the last 72 hours.  Invalid input(s): FREET3 ------------------------------------------------------------------------------------------------------------------ No results for input(s): VITAMINB12, FOLATE, FERRITIN, TIBC, IRON, RETICCTPCT in the last 72 hours.  Coagulation profile No results for input(s): INR, PROTIME in the last 168 hours.  No results for input(s): DDIMER in the last 72 hours.  Cardiac Enzymes  Recent Labs Lab 08/08/15 1004 08/08/15 1738 08/08/15 2310  TROPONINI <0.03 <0.03 <0.03   ------------------------------------------------------------------------------------------------------------------ Invalid input(s): POCBNP   CBG: No results for input(s): GLUCAP in the last 168 hours.     Studies: Dg Chest 2 View  08/07/2015   CLINICAL DATA:  Cough, congestion and weakness with chest pain, nausea and vomiting 2 days.  EXAM: CHEST  2 VIEW  COMPARISON:  09/13/2014  FINDINGS: Lungs are adequately inflated with minimal prominence of the perihilar markings and subtle basilar interstitial  prominence. No lobar consolidation or effusion. Mild stable cardiomegaly. Mild degenerative change of the spine.  IMPRESSION: Findings suggesting mild vascular congestion or possible interstitial pneumonia.   Electronically Signed   By: Elberta Fortis M.D.   On: 08/07/2015 22:44      Lab Results  Component Value Date   HGBA1C 6.2* 06/28/2013   HGBA1C 5.6 10/03/2012   HGBA1C 6.1* 05/13/2011   Lab Results  Component Value Date   LDLCALC 128* 10/02/2012   CREATININE 0.77 08/08/2015       Scheduled Meds: . amiodarone  200 mg Oral Daily  . amLODipine  5 mg Oral Daily  . antiseptic oral rinse  7 mL Mouth Rinse BID  . azithromycin  500 mg Intravenous Q24H  . calcium-vitamin D  1 tablet Oral Daily  . cefTRIAXone (ROCEPHIN)  IV  1 g Intravenous Q24H  . enoxaparin (LOVENOX) injection  40 mg Subcutaneous Q24H  . furosemide  20 mg Oral Daily  . methylPREDNISolone (SOLU-MEDROL) injection  40 mg Intravenous Q12H  . metoprolol succinate  25 mg Oral Daily  . montelukast  10 mg Oral QHS  . nicotine  7 mg Transdermal Daily  . pantoprazole  40 mg Oral BID  . potassium chloride SA  20 mEq Oral BID   Continuous Infusions:   Principal Problem:   CAP (community acquired pneumonia) Active Problems:   TOBACCO USER   Essential hypertension   Atrial fibrillation   CKD (chronic kidney disease) stage 3, GFR 30-59 ml/min   Chronic diastolic heart failure   COPD exacerbation    Time spent: 45 minutes   [facility]  [facility] Pager [phone]. If 7PM-7AM, please contact night-coverage at [email or web address], password [facility] 08/09/2015, 11:21 AM  LOS: 1 day

## 2015-08-10 ENCOUNTER — Inpatient Hospital Stay (HOSPITAL_COMMUNITY): Payer: Medicare Other

## 2015-08-10 LAB — COMPREHENSIVE METABOLIC PANEL
ALK PHOS: 97 U/L (ref 38–126)
ALT: 19 U/L (ref 14–54)
ANION GAP: 9 (ref 5–15)
AST: 16 U/L (ref 15–41)
Albumin: 3.6 g/dL (ref 3.5–5.0)
BUN: 42 mg/dL — ABNORMAL HIGH (ref 6–20)
CALCIUM: 10.3 mg/dL (ref 8.9–10.3)
CHLORIDE: 102 mmol/L (ref 101–111)
CO2: 24 mmol/L (ref 22–32)
CREATININE: 1.38 mg/dL — AB (ref 0.44–1.00)
GFR, EST AFRICAN AMERICAN: 45 mL/min — AB (ref >60)
GFR, EST NON AFRICAN AMERICAN: 39 mL/min — AB (ref >60)
Glucose, Bld: 126 mg/dL — ABNORMAL HIGH (ref 65–99)
Potassium: 5.1 mmol/L (ref 3.5–5.1)
Sodium: 135 mmol/L (ref 135–145)
Total Bilirubin: 0.8 mg/dL (ref 0.3–1.2)
Total Protein: 9.1 g/dL — ABNORMAL HIGH (ref 6.5–8.1)

## 2015-08-10 LAB — CBC
HCT: 44.3 % (ref 36.0–46.0)
Hemoglobin: 14.2 g/dL (ref 12.0–15.0)
MCH: 22.2 pg — AB (ref 26.0–34.0)
MCHC: 32.1 g/dL (ref 30.0–36.0)
MCV: 69.1 fL — AB (ref 78.0–100.0)
PLATELETS: 502 10*3/uL — AB (ref 150–400)
RBC: 6.41 MIL/uL — AB (ref 3.87–5.11)
RDW: 19 % — ABNORMAL HIGH (ref 11.5–15.5)
WBC: 9.2 10*3/uL (ref 4.0–10.5)

## 2015-08-10 MED ORDER — CEFDINIR 125 MG/5ML PO SUSR: 250.0000 mg | Freq: Two times a day (BID) | ORAL | Status: DC

## 2015-08-10 MED ORDER — PREDNISONE 50 MG PO TABS
50.0000 mg | ORAL_TABLET | Freq: Every day | ORAL | Status: DC
  Administered 2015-08-10 – 2015-08-12 (×3): 50 mg via ORAL
  Filled 2015-08-10 (×3): qty 1

## 2015-08-10 MED ORDER — AZITHROMYCIN 500 MG PO TABS
500.0000 mg | ORAL_TABLET | Freq: Every day | ORAL | Status: DC
  Administered 2015-08-10 – 2015-08-11 (×2): 500 mg via ORAL
  Filled 2015-08-10 (×2): qty 1

## 2015-08-10 MED ORDER — CEFUROXIME AXETIL 250 MG PO TABS
500.0000 mg | ORAL_TABLET | Freq: Two times a day (BID) | ORAL | Status: DC
  Administered 2015-08-10 – 2015-08-12 (×4): 500 mg via ORAL
  Filled 2015-08-10 (×5): qty 2

## 2015-08-10 MED ORDER — LORAZEPAM 0.5 MG PO TABS
0.5000 mg | ORAL_TABLET | Freq: Once | ORAL | Status: AC
  Administered 2015-08-11: 0.5 mg via ORAL
  Filled 2015-08-10 (×2): qty 1

## 2015-08-10 NOTE — Evaluation (Signed)
Physical Therapy Evaluation Patient Details Name: [name] MRN: [id number] DOB: Nov 13, 1951 Today's Date: 08/10/2015   History of Present Illness  Admitted 9/14/16for community-acquired pneumonia, atrial fibrillation with rapid ventricular response, hypoxemic respiratory failure requiring 4 L of oxygen  Clinical Impression  Patient is very mobile , noted DOE, sats > 90% on RA ambulating x 200'. Instructed in pursed lip breathing. Patient will benefit from PT  To address problems listed in note below.    Follow Up Recommendations No PT follow up    Equipment Recommendations  None recommended by PT    Recommendations for Other Services       Precautions / Restrictions Precautions Precautions: Fall Precaution Comments: monitor VS      Mobility  Bed Mobility Overal bed mobility: Independent                Transfers Overall transfer level: Independent                  Ambulation/Gait Ambulation/Gait assistance: Supervision Ambulation Distance (Feet): 200 Feet   Gait Pattern/deviations: WFL(Within Functional Limits)     General Gait Details: held onto dynamap  Stairs            Wheelchair Mobility    Modified Rankin (Stroke Patients Only)       Balance Overall balance assessment: Independent         Standing balance support: During functional activity Standing balance-Leahy Scale: Normal Standing balance comment: stood and performed self care bath                              Pertinent Vitals/Pain Pain Assessment: No/denies pain    Home Living Family/patient expects to be discharged to:: Private residence Living Arrangements: Other relatives Available Help at Discharge: Family Type of Home: House Home Access: Stairs to enter   Secretary/administrator of Steps: 3 Home Layout: Two level   Additional Comments: patient is caregiver of 3 yo great grandson    Prior Function Level of Independence: Independent                Hand Dominance        Extremity/Trunk Assessment   Upper Extremity Assessment: Overall WFL for tasks assessed           Lower Extremity Assessment: Overall WFL for tasks assessed      Cervical / Trunk Assessment: Normal  Communication   Communication: No difficulties  Cognition Arousal/Alertness: Awake/alert Behavior During Therapy: WFL for tasks assessed/performed Overall Cognitive Status: Within Functional Limits for tasks assessed                      General Comments      Exercises        Assessment/Plan    PT Assessment Patient needs continued PT services  PT Diagnosis Generalized weakness   PT Problem List Decreased activity tolerance;Cardiopulmonary status limiting activity  PT Treatment Interventions Gait training;Stair training   PT Goals (Current goals can be found in the Care Plan section) Acute Rehab PT Goals Patient Stated Goal: to take care of my gr grandson PT Goal Formulation: With patient Time For Goal Achievement: 08/24/15 Potential to Achieve Goals: Good    Frequency Min 3X/week   Barriers to discharge        Co-evaluation               End of Session   Activity Tolerance:  Patient tolerated treatment well Patient left: in chair;with call bell/phone within reach;with chair alarm set Nurse Communication: Mobility status         Time: 6734-1937 PT Time Calculation (min) (ACUTE ONLY): 36 min   Charges:   PT Evaluation $Initial PT Evaluation Tier I: 1 Procedure PT Treatments $Gait Training: 8-22 mins   PT G Codes:        Rada Hay 08/10/2015, 1:12 PM Blanchard Kelch PT [phone]

## 2015-08-10 NOTE — Evaluation (Signed)
Occupational Therapy Evaluation Patient Details Name: [name] MRN: [id number] DOB: [birth year] Today's Date: 08/10/2015    History of Present Illness Admitted 9/14/16for community-acquired pneumonia, atrial fibrillation with rapid ventricular response, hypoxemic respiratory failure requiring 4 L of oxygen   Clinical Impression   Patient presenting with deconditioning and decreased functional mobility independence. Patient independent PTA. Patient currently functioning at an overall supervision level during OT evaluation. Patient will benefit from acute OT to increase overall independence in the areas of ADLs, functional mobility, and overall safety to reach a mod I level in order to safely discharge home to take care of 65 year old grandson.   On RA, patient's 02 sats ranged from 84%-95%. When less than 90% encouraged pursed lip breathing and seated rest break. Administered energy conservation handout.     Follow Up Recommendations  No OT follow up;Supervision - Intermittent    Equipment Recommendations  None recommended by OT    Recommendations for Other Services  None at this time    Precautions / Restrictions Precautions Precautions: Fall Precaution Comments: monitor VS Restrictions Weight Bearing Restrictions: No    Mobility Bed Mobility Overal bed mobility: Independent General bed mobility comments: Pt found seated in recliner and left seated in recliner  Transfers Overall transfer level: Needs assistance   Transfers: Sit to/from Stand Sit to Stand: Supervision General transfer comment: Supervision for safety during OT evaluation, pt a little unsteady on feet.     Balance Overall balance assessment: Needs assistance Sitting-balance support: No upper extremity supported;Feet supported Sitting balance-Leahy Scale: Normal     Standing balance support: No upper extremity supported;During functional activity Standing balance-Leahy Scale: Fair Standing  balance comment: Pt a little unsteady on feet during OT evaluation    ADL Overall ADL's : Needs assistance/impaired General ADL Comments: Currently, pt set-up/supervision for ADLs. Pt supervision for mobility in room, a little unsteady on feet. Pt ambulated > BR for toilet transfer and toileting needs; performing this at a supervision level. [employer] energy conservation handout.    Pertinent Vitals/Pain Pain Assessment: No/denies pain     Hand Dominance Right   Extremity/Trunk Assessment Upper Extremity Assessment Upper Extremity Assessment: Overall WFL for tasks assessed   Lower Extremity Assessment Lower Extremity Assessment: Overall WFL for tasks assessed   Cervical / Trunk Assessment Cervical / Trunk Assessment: Normal   Communication Communication Communication: No difficulties   Cognition Arousal/Alertness: Awake/alert Behavior During Therapy: WFL for tasks assessed/performed Overall Cognitive Status: Within Functional Limits for tasks assessed              Home Living Family/patient expects to be discharged to:: Private residence Living Arrangements: Other relatives Available Help at Discharge: Family Type of Home: House Home Access: Stairs to enter Secretary/administrator of Steps: 3   Home Layout: Two level Alternate Level Stairs-Number of Steps: 12 Alternate Level Stairs-Rails: Left Bathroom Shower/Tub: Tub/shower unit;Curtain   Bathroom Toilet: Standard     Home Equipment: None   Additional Comments: patient is caregiver of 3 yo great grandson    Prior Functioning/Environment Level of Independence: Independent     OT Diagnosis: Generalized weakness   OT Problem List: Decreased activity tolerance;Impaired balance (sitting and/or standing);Decreased safety awareness   OT Treatment/Interventions: Self-care/ADL training;Energy conservation;Balance training;Patient/family education    OT Goals(Current goals can be found in the care plan section)  Acute Rehab OT Goals Patient Stated Goal: none stated OT Goal Formulation: With patient Time For Goal Achievement: 08/24/15 Potential to Achieve Goals: Good ADL Goals Pt  Will Perform Grooming: standing;Independently Pt Will Transfer to Toilet: Independently;ambulating Pt Will Perform Tub/Shower Transfer: Tub transfer;ambulating;Independently Additional ADL Goal #1: Pt will be able to verbalize at least 3 energy conservation techniques from handout   OT Frequency: Min 2X/week   Barriers to D/C: None known at this time   End of Session Activity Tolerance: Patient tolerated treatment well Patient left: in chair;with call bell/phone within reach;with chair alarm set   Time: 1355-1408 OT Time Calculation (min): 13 min Charges:  OT General Charges $OT Visit: 1 Procedure OT Evaluation $Initial OT Evaluation Tier I: 1 Procedure  Arnell Slivinski , MS, OTR/L, CLT Pager: [phone]  08/10/2015, 2:22 PM

## 2015-08-10 NOTE — Care Management Important Message (Signed)
Important Message  Patient Details  Name: [name] MRN: [id number] Date of Birth: 09-May-1951   Medicare Important Message Given:  [facility] notification given    Haskell Flirt 08/10/2015, 11:58 AMImportant Message  Patient Details  Name: [name] MRN: [id number] Date of Birth: 06/11/1951   Medicare Important Message Given:  Yes-second notification given    Haskell Flirt 08/10/2015, 11:58 AM

## 2015-08-10 NOTE — Progress Notes (Addendum)
[facility] PROGRESS NOTE  [name] JSE:[id number] DOB: 08-07-1951 DOA: 08/07/2015 PCP: [name], MD  Assessment/Plan: Principal Problem:   CAP (community acquired pneumonia) Active Problems:   TOBACCO USER   Essential hypertension   Atrial fibrillation   CKD (chronic kidney disease) stage 3, GFR 30-59 ml/min   Chronic diastolic heart failure   COPD exacerbation      Community acquired pneumonia-patient has had significant improvement since admission, now down to room air, chest x-ray shows probable interstitial pneumonia, continue current antibiotics, will switch to by mouth azithromycin and Omnicef today.  Dysphagia-patient feels that she is regurgitating after she swallows. This is a new problem that started after admission. Will order speech therapy consultation and esophagogram. Continue twice a day PPI  Acute COPD exacerbation-active smoker, increased interstitial markings, likely has COPD, discontinue IV steroids, start by mouth prednisone  Atrial fibrillation-rate controlled on amiodarone, tachycardia improved overnight with IV hydration  Essential hypertension-blood pressure improved, continue Norvasc at 10 mg  Acute on chronic diastolic heart failure, last 2-D echo was in 2013 that showed a need for 55-60%, continue Lasix 20 mg a day, repeat 2-D echo shows EF of 55-60%, grade 1 diastolic dysfunction  CKD (chronic kidney disease) stage 3, GFR 30-59 ml/min-creatinine has increased overnight but patient's baseline is around 1.2-1.3. Hold Lasix today and recheck creatinine is stable, resume Lasix tomorrow.  Nicotine dependence, continue nicotine patch  DVT prophylaxsis   Code Status:      Code Status Orders        Start     Ordered   08/08/15 0802  Full code   Continuous     08/08/15 0801     Family Communication: No family by the bedside Disposition Plan:  Physical therapy evaluation, anticipate discharge tomorrow   Brief  narrative: [name] is a 64 y.o. female with a history of COPD, HTN, CHF,Atrial fibrillation, and DM2 who presents to the ED with complaints of SOB, Cough and Fevers and Chills x 1 day. She reports having wheezing off and on x 1 week. She has been coughing up whitish sputum. She has also had nausea and vomiting but no diarrhea. In the ED she was hypoxic to 83% and was placed on 4 liters NCO2. She was also found to have fever to 101.5. A Chest X-ray was performed and revealed Interstitial Pneumonia and Mild Vascular congestion. She was placed on IV antibiotics for CAP pneumonia and referred for admission.  Consultants:  None  Procedures:  None  Antibiotics: Anti-infectives    Start     Dose/Rate Route Frequency Ordered Stop   08/09/15 0100  azithromycin (ZITHROMAX) 500 mg in dextrose 5 % 250 mL IVPB     500 mg 250 mL/hr over 60 Minutes Intravenous Every 24 hours 08/08/15 0801 08/16/15 0059   08/09/15 0000  cefTRIAXone (ROCEPHIN) 1 g in dextrose 5 % 50 mL IVPB     1 g 100 mL/hr over 30 Minutes Intravenous Every 24 hours 08/08/15 0801 08/15/15 2359   08/08/15 0000  cefTRIAXone (ROCEPHIN) 1 g in dextrose 5 % 50 mL IVPB     1 g 100 mL/hr over 30 Minutes Intravenous  Once 08/07/15 2358 08/08/15 0114   08/08/15 0000  azithromycin (ZITHROMAX) 500 mg in dextrose 5 % 250 mL IVPB     500 mg 250 mL/hr over 60 Minutes Intravenous  Once 08/07/15 2358 08/08/15 0230         HPI/Subjective: Patient complains  of regurgitation after she swallows, feels that something is obstructing the food from going down   Objective: Filed Vitals:   08/09/15 1700 08/09/15 1818 08/09/15 2041 08/10/15 0607  BP:  139/82 166/81 125/73  Pulse: 71 75 72 58  Temp:  97.9 F (36.6 C) 98.6 F (37 C) 97.7 F (36.5 C)  TempSrc:  Oral Oral Axillary  Resp: Height:      Weight:      SpO2: 96% 99% 97% 93%    Intake/Output Summary (Last 24 hours) at 08/10/15 1234 Last data filed  at 08/10/15 0300  Gross per 24 hour  Intake    300 ml  Output    550 ml  Net   -250 ml    Exam:  General: No acute respiratory distress Lungs: Clear to auscultation bilaterally without wheezes or crackles Cardiovascular: Regular rate and rhythm without murmur gallop or rub normal S1 and S2 Abdomen: Nontender, nondistended, soft, bowel sounds positive, no rebound, no ascites, no appreciable mass Extremities: No significant cyanosis, clubbing, or edema bilateral lower extremities     Data Review   Micro Results Recent Results (from the past 240 hour(s))  Culture, blood (routine x 2)     Status: None (Preliminary result)   Collection Time: 08/08/15 12:20 AM  Result Value Ref Range Status   Specimen Description BLOOD LEFT AC  Final   Special Requests BOTTLES DRAWN AEROBIC AND ANAEROBIC 9CC  Final   Culture   Final    NO GROWTH 2 DAYS Performed at [facility]    Report Status PENDING  Incomplete  Culture, blood (routine x 2)     Status: None (Preliminary result)   Collection Time: 08/08/15 12:20 AM  Result Value Ref Range Status   Specimen Description BLOOD LEFT HAND  Final   Special Requests IN PEDIATRIC BOTTLE 3CC  Final   Culture   Final    NO GROWTH 2 DAYS Performed at [facility]    Report Status PENDING  Incomplete  MRSA PCR Screening     Status: None   Collection Time: 08/08/15  9:00 AM  Result Value Ref Range Status   MRSA by PCR NEGATIVE NEGATIVE Final    Comment:        The GeneXpert MRSA Assay (FDA approved for NASAL specimens only), is one component of a comprehensive MRSA colonization surveillance program. It is not intended to diagnose MRSA infection nor to guide or monitor treatment for MRSA infections.     Radiology Reports Dg Chest 2 View  08/07/2015   CLINICAL DATA:  Cough, congestion and weakness with chest pain, nausea and vomiting 2 days.  EXAM: CHEST  2 VIEW  COMPARISON:  09/13/2014  FINDINGS: Lungs are adequately inflated  with minimal prominence of the perihilar markings and subtle basilar interstitial prominence. No lobar consolidation or effusion. Mild stable cardiomegaly. Mild degenerative change of the spine.  IMPRESSION: Findings suggesting mild vascular congestion or possible interstitial pneumonia.   Electronically Signed   By: Elberta Fortis M.D.   On: 08/07/2015 22:44   Mm Digital Screening Bilateral  08/06/2015   CLINICAL DATA:  Screening.  EXAM: DIGITAL SCREENING BILATERAL MAMMOGRAM WITH CAD  COMPARISON:  Previous exam(s).  ACR Breast Density Category b: There are scattered areas of fibroglandular density.  FINDINGS: There are no findings suspicious for malignancy. Images were processed with CAD.  IMPRESSION: No mammographic evidence of malignancy. A result letter of this screening mammogram will be mailed  directly to the patient.  RECOMMENDATION: Screening mammogram in one year. (Code:SM-B-01Y)  BI-RADS CATEGORY  1: Negative.   Electronically Signed   By: Baird Lyons M.D.   On: 08/06/2015 12:09     CBC  Recent Labs Lab 08/07/15 2240 08/08/15 1004 08/10/15 0522  WBC 12.6* 9.4 9.2  HGB 12.4 12.8 14.2  HCT 39.8 40.1 44.3  PLT 444* 388 502*  MCV 69.9* 69.1* 69.1*  MCH 21.8* 22.1* 22.2*  MCHC 31.2 31.9 32.1  RDW 18.6* 18.7* 19.0*    Chemistries   Recent Labs Lab 08/07/15 2240 08/08/15 1004 08/10/15 0522  NA 137 136 135  K 4.0 3.9 5.1  CL 106 104 102  CO2 21* 22 24  GLUCOSE 144* 142* 126*  BUN 16 15 42*  CREATININE 1.05* 0.77 1.38*  CALCIUM 9.8 9.9 10.3  AST  --   --  16  ALT  --   --  19  ALKPHOS  --   --  97  BILITOT  --   --  0.8   ------------------------------------------------------------------------------------------------------------------ estimated creatinine clearance is 45.7 mL/min (by C-G formula based on Cr of 1.38). ------------------------------------------------------------------------------------------------------------------ No results for input(s): HGBA1C in the  last 72 hours. ------------------------------------------------------------------------------------------------------------------ No results for input(s): CHOL, HDL, LDLCALC, TRIG, CHOLHDL, LDLDIRECT in the last 72 hours. ------------------------------------------------------------------------------------------------------------------ No results for input(s): TSH, T4TOTAL, T3FREE, THYROIDAB in the last 72 hours.  Invalid input(s): FREET3 ------------------------------------------------------------------------------------------------------------------ No results for input(s): VITAMINB12, FOLATE, FERRITIN, TIBC, IRON, RETICCTPCT in the last 72 hours.  Coagulation profile No results for input(s): INR, PROTIME in the last 168 hours.  No results for input(s): DDIMER in the last 72 hours.  Cardiac Enzymes  Recent Labs Lab 08/08/15 1004 08/08/15 1738 08/08/15 2310  TROPONINI <0.03 <0.03 <0.03   ------------------------------------------------------------------------------------------------------------------ Invalid input(s): POCBNP   CBG: No results for input(s): GLUCAP in the last 168 hours.     Studies: No results found.    Lab Results  Component Value Date   HGBA1C 6.2* 06/28/2013   HGBA1C 5.6 10/03/2012   HGBA1C 6.1* 05/13/2011   Lab Results  Component Value Date   LDLCALC 128* 10/02/2012   CREATININE 1.38* 08/10/2015       Scheduled Meds: . amiodarone  200 mg Oral Daily  . amLODipine  5 mg Oral Daily  . antiseptic oral rinse  7 mL Mouth Rinse BID  . azithromycin  500 mg Intravenous Q24H  . calcium-vitamin D  1 tablet Oral Daily  . cefTRIAXone (ROCEPHIN)  IV  1 g Intravenous Q24H  . enoxaparin (LOVENOX) injection  40 mg Subcutaneous Q24H  . methylPREDNISolone (SOLU-MEDROL) injection  40 mg Intravenous Q12H  . metoprolol succinate  25 mg Oral Daily  . montelukast  10 mg Oral QHS  . nicotine  7 mg Transdermal Daily  . pantoprazole  40 mg Oral BID  .  potassium chloride SA  20 mEq Oral BID   Continuous Infusions:   Principal Problem:   CAP (community acquired pneumonia) Active Problems:   TOBACCO USER   Essential hypertension   Atrial fibrillation   CKD (chronic kidney disease) stage 3, GFR 30-59 ml/min   Chronic diastolic heart failure   COPD exacerbation    Time spent: 45 minutes   [facility]  [facility] Pager [phone]. If 7PM-7AM, please contact night-coverage at [email or web address], password [facility] 08/10/2015, 12:34 PM  LOS: 2 days

## 2015-08-10 NOTE — Progress Notes (Signed)
[facility] PROGRESS NOTE  [name] ZOX:[id number] DOB: 1951-07-18 DOA: 08/07/2015 PCP: [name], MD  Assessment/Plan: Principal Problem:   CAP (community acquired pneumonia) Active Problems:   TOBACCO USER   Essential hypertension   Atrial fibrillation   CKD (chronic kidney disease) stage 3, GFR 30-59 ml/min   Chronic diastolic heart failure   COPD exacerbation      Community acquired pneumonia-chest x-ray shows probable interstitial pneumonia, continue current antibiotics  Acute COPD exacerbation-active smoker, increased interstitial markings, likely has COPD, continue IV steroids  Atrial fibrillation-rate controlled on amiodarone, tachycardia improved overnight with IV hydration  Essential hypertension-increase Norvasc to 10 mg given uncontrolled blood pressure,  Acute on chronic diastolic heart failure, last 2-D echo was in 2013 that showed a need for 55-60%, continue Lasix 20 mg a day, repeat 2-D echo pending  CKD (chronic kidney disease) stage 3, GFR 30-59 ml/min-creatinine at baseline Monitor BUN/Cr  Nicotine dependence, continue nicotine patch  DVT prophylaxsis   Code Status:      Code Status Orders        Start     Ordered   08/08/15 0802  Full code   Continuous     08/08/15 0801     Family Communication: No family by the bedside Disposition Plan:  Transfer to telemetry today, anticipate discharge in one to 2 days   Brief narrative: [name] is a 65 y.o. female with a history of COPD, HTN, CHF,Atrial fibrillation, and DM2 who presents to the ED with complaints of SOB, Cough and Fevers and Chills x 1 day. She reports having wheezing off and on x 1 week. She has been coughing up whitish sputum. She has also had nausea and vomiting but no diarrhea. In the ED she was hypoxic to 83% and was placed on 4 liters NCO2. She was also found to have fever to 101.5. A Chest X-ray was performed and  revealed Interstitial Pneumonia and Mild Vascular congestion. She was placed on IV antibiotics for CAP pneumonia and referred for admission.  Consultants:  None  Procedures:  None  Antibiotics: Anti-infectives    Start     Dose/Rate Route Frequency Ordered Stop   08/09/15 0100  azithromycin (ZITHROMAX) 500 mg in dextrose 5 % 250 mL IVPB     500 mg 250 mL/hr over 60 Minutes Intravenous Every 24 hours 08/08/15 0801 08/16/15 0059   08/09/15 0000  cefTRIAXone (ROCEPHIN) 1 g in dextrose 5 % 50 mL IVPB     1 g 100 mL/hr over 30 Minutes Intravenous Every 24 hours 08/08/15 0801 08/15/15 2359   08/08/15 0000  cefTRIAXone (ROCEPHIN) 1 g in dextrose 5 % 50 mL IVPB     1 g 100 mL/hr over 30 Minutes Intravenous  Once 08/07/15 2358 08/08/15 0114   08/08/15 0000  azithromycin (ZITHROMAX) 500 mg in dextrose 5 % 250 mL IVPB     500 mg 250 mL/hr over 60 Minutes Intravenous  Once 08/07/15 2358 08/08/15 0230         HPI/Subjective: Patient awake alert oriented, feels better today, tachycardia resolved, denies any chest pain shortness of breath   Objective: Filed Vitals:   08/09/15 1700 08/09/15 1818 08/09/15 2041 08/10/15 0607  BP:  139/82 166/81 125/73  Pulse: 71 75 72 58  Temp:  97.9 F (36.6 C) 98.6 F (37 C) 97.7 F (36.5 C)  TempSrc:  Oral Oral Axillary  Resp: 23 20 20 16   Height:  Weight:      SpO2: 96% 99% 97% 93%    Intake/Output Summary (Last 24 hours) at 08/10/15 4709 Last data filed at 08/10/15 0300  Gross per 24 hour  Intake    300 ml  Output    550 ml  Net   -250 ml    Exam:  General: No acute respiratory distress Lungs: Clear to auscultation bilaterally without wheezes or crackles Cardiovascular: Regular rate and rhythm without murmur gallop or rub normal S1 and S2 Abdomen: Nontender, nondistended, soft, bowel sounds positive, no rebound, no ascites, no appreciable mass Extremities: No significant cyanosis, clubbing, or edema bilateral lower  extremities     Data Review   Micro Results Recent Results (from the past 240 hour(s))  Culture, blood (routine x 2)     Status: None (Preliminary result)   Collection Time: 08/08/15 12:20 AM  Result Value Ref Range Status   Specimen Description BLOOD LEFT AC  Final   Special Requests BOTTLES DRAWN AEROBIC AND ANAEROBIC 9CC  Final   Culture   Final    NO GROWTH 1 DAY Performed at [facility]    Report Status PENDING  Incomplete  Culture, blood (routine x 2)     Status: None (Preliminary result)   Collection Time: 08/08/15 12:20 AM  Result Value Ref Range Status   Specimen Description BLOOD LEFT HAND  Final   Special Requests IN PEDIATRIC BOTTLE 3CC  Final   Culture   Final    NO GROWTH 1 DAY Performed at [facility]    Report Status PENDING  Incomplete  MRSA PCR Screening     Status: None   Collection Time: 08/08/15  9:00 AM  Result Value Ref Range Status   MRSA by PCR NEGATIVE NEGATIVE Final    Comment:        The GeneXpert MRSA Assay (FDA approved for NASAL specimens only), is one component of a comprehensive MRSA colonization surveillance program. It is not intended to diagnose MRSA infection nor to guide or monitor treatment for MRSA infections.     Radiology Reports Dg Chest 2 View  08/07/2015   CLINICAL DATA:  Cough, congestion and weakness with chest pain, nausea and vomiting 2 days.  EXAM: CHEST  2 VIEW  COMPARISON:  09/13/2014  FINDINGS: Lungs are adequately inflated with minimal prominence of the perihilar markings and subtle basilar interstitial prominence. No lobar consolidation or effusion. Mild stable cardiomegaly. Mild degenerative change of the spine.  IMPRESSION: Findings suggesting mild vascular congestion or possible interstitial pneumonia.   Electronically Signed   By: Elberta Fortis M.D.   On: 08/07/2015 22:44   Mm Digital Screening Bilateral  08/06/2015   CLINICAL DATA:  Screening.  EXAM: DIGITAL SCREENING BILATERAL MAMMOGRAM  WITH CAD  COMPARISON:  Previous exam(s).  ACR Breast Density Category b: There are scattered areas of fibroglandular density.  FINDINGS: There are no findings suspicious for malignancy. Images were processed with CAD.  IMPRESSION: No mammographic evidence of malignancy. A result letter of this screening mammogram will be mailed directly to the patient.  RECOMMENDATION: Screening mammogram in one year. (Code:SM-B-01Y)  BI-RADS CATEGORY  1: Negative.   Electronically Signed   By: Baird Lyons M.D.   On: 08/06/2015 12:09     CBC  Recent Labs Lab 08/07/15 2240 08/08/15 1004 08/10/15 0522  WBC 12.6* 9.4 9.2  HGB 12.4 12.8 14.2  HCT 39.8 40.1 44.3  PLT 444* 388 502*  MCV 69.9* 69.1* 69.1*  MCH  21.8* 22.1* 22.2*  MCHC 31.2 31.9 32.1  RDW 18.6* 18.7* 19.0*    Chemistries   Recent Labs Lab 08/07/15 2240 08/08/15 1004 08/10/15 0522  NA 137 136 135  K 4.0 3.9 5.1  CL 106 104 102  CO2 21* 22 24  GLUCOSE 144* 142* 126*  BUN 16 15 42*  CREATININE 1.05* 0.77 1.38*  CALCIUM 9.8 9.9 10.3  AST  --   --  16  ALT  --   --  19  ALKPHOS  --   --  97  BILITOT  --   --  0.8   ------------------------------------------------------------------------------------------------------------------ estimated creatinine clearance is 45.7 mL/min (by C-G formula based on Cr of 1.38). ------------------------------------------------------------------------------------------------------------------ No results for input(s): HGBA1C in the last 72 hours. ------------------------------------------------------------------------------------------------------------------ No results for input(s): CHOL, HDL, LDLCALC, TRIG, CHOLHDL, LDLDIRECT in the last 72 hours. ------------------------------------------------------------------------------------------------------------------ No results for input(s): TSH, T4TOTAL, T3FREE, THYROIDAB in the last 72 hours.  Invalid input(s):  FREET3 ------------------------------------------------------------------------------------------------------------------ No results for input(s): VITAMINB12, FOLATE, FERRITIN, TIBC, IRON, RETICCTPCT in the last 72 hours.  Coagulation profile No results for input(s): INR, PROTIME in the last 168 hours.  No results for input(s): DDIMER in the last 72 hours.  Cardiac Enzymes  Recent Labs Lab 08/08/15 1004 08/08/15 1738 08/08/15 2310  TROPONINI <0.03 <0.03 <0.03   ------------------------------------------------------------------------------------------------------------------ Invalid input(s): POCBNP   CBG: No results for input(s): GLUCAP in the last 168 hours.     Studies: No results found.    Lab Results  Component Value Date   HGBA1C 6.2* 06/28/2013   HGBA1C 5.6 10/03/2012   HGBA1C 6.1* 05/13/2011   Lab Results  Component Value Date   LDLCALC 128* 10/02/2012   CREATININE 1.38* 08/10/2015       Scheduled Meds: . amiodarone  200 mg Oral Daily  . amLODipine  5 mg Oral Daily  . antiseptic oral rinse  7 mL Mouth Rinse BID  . azithromycin  500 mg Intravenous Q24H  . calcium-vitamin D  1 tablet Oral Daily  . cefTRIAXone (ROCEPHIN)  IV  1 g Intravenous Q24H  . enoxaparin (LOVENOX) injection  40 mg Subcutaneous Q24H  . furosemide  20 mg Oral Daily  . methylPREDNISolone (SOLU-MEDROL) injection  40 mg Intravenous Q12H  . metoprolol succinate  25 mg Oral Daily  . montelukast  10 mg Oral QHS  . nicotine  7 mg Transdermal Daily  . pantoprazole  40 mg Oral BID  . potassium chloride SA  20 mEq Oral BID   Continuous Infusions:   Principal Problem:   CAP (community acquired pneumonia) Active Problems:   TOBACCO USER   Essential hypertension   Atrial fibrillation   CKD (chronic kidney disease) stage 3, GFR 30-59 ml/min   Chronic diastolic heart failure   COPD exacerbation    Time spent: 45 minutes   [facility]  [facility] Pager [phone]. If  7PM-7AM, please contact night-coverage at [email or web address], password [facility] 08/10/2015, 9:06 AM  LOS: 2 days

## 2015-08-10 NOTE — Evaluation (Signed)
Clinical/Bedside Swallow Evaluation Patient Details  Name: [name] MRN: [id number] Date of Birth: [birth year]  Today's Date: 08/10/2015 Time: SLP Start Time (ACUTE ONLY): 1645 SLP Stop Time (ACUTE ONLY): 1700 SLP Time Calculation (min) (ACUTE ONLY): 15 min  Past Medical History:  Past Medical History  Diagnosis Date  . Hypertension   . Borderline diabetes   . Irregular heart beat   . Hyperlipidemia   . CHF (congestive heart failure)   . CKD (chronic kidney disease) stage 3, GFR 30-59 ml/min   . Dysrhythmia     A-fib  . Anxiety   . Depression   . Shortness of breath     on exertion  . GERD (gastroesophageal reflux disease)   . Headache(784.0)   . Arthritis     knees  . Anemia    Past Surgical History:  Past Surgical History  Procedure Laterality Date  . Hemorrhoid surgery    . Esophagogastroduodenoscopy N/A 02/25/2013    Procedure: ESOPHAGOGASTRODUODENOSCOPY (EGD);  Surgeon: Theda Belfast, MD;  Location: [facility];  Service: Endoscopy;  Laterality: N/A;  . Colonoscopy N/A 02/25/2013    Procedure: COLONOSCOPY;  Surgeon: Theda Belfast, MD;  Location: [facility];  Service: Endoscopy;  Laterality: N/A;  . Givens capsule study N/A 02/26/2013    Procedure: GIVENS CAPSULE STUDY;  Surgeon: Theda Belfast, MD;  Location: [facility];  Service: Endoscopy;  Laterality: N/A;   HPI:  pt is a 64 yo female adm to [facility] 9/13 with N/V - diagnosed with CAP.  Pt reports issues with liquids "coming back up to throat" after swallowing.  She reports issues within the last few months with gradual onset.  Intermittent presentation to dysphagia reported by pt.  She has h/o COPD  - smoker.  Pt currently reports much better po tolerance since her barium swallow.  Esophagram showed reflux, nothing significant in pharynx and normal esophageal motility.  Speech swallow evaluation ordered.    Assessment / Plan / Recommendation Clinical Impression  Pt CN exam was unremarkable and she did not  demonstrate symptoms of severe dysphagia with intake observed.  Pt does report improved swallow ability since her barium swallow was conducted.  Minimal delay in oral transiting with cake noted,  likely due to pt's xerostomia and dry texture.   Pt consumed liquids to faciliate clearance, which was effective.    Recommend continue regular/thin diet with general aspiration/reflux precautions. SLP advised pt to general aspiration/reflux precautions and provided information in writing.  Pt noted to have mild reflux on barium swallow and this appears consistent with her reported symptoms.  Advised pt to discontinue smoking due to its impact on swallowing.    Anticipate continued swallow improvement with medical recovery given pt N/V prior to admit and subjective improvement noted already.  All education completed to mitigate pt's dysphagia using teach back and written information.   Thanks for this consult.    Aspiration Risk  Mild    Diet Recommendation Age appropriate regular solids;Thin   Medication Administration: Whole meds with liquid Compensations:  (start meals with liquids, consume liquids t/o meals)    Other  Recommendations Oral Care Recommendations: Oral care BID   Follow Up Recommendations       Frequency and Duration        Pertinent Vitals/Pain Afebrile, decreased     Swallow Study Prior Functional Status  Type of Home: House Available Help at Discharge: Family    General Date of Onset: 08/10/15 Other Pertinent Information: pt is  a 64 yo female adm to [facility] 9/13 with N/V - diagnosed with CAP.  Pt reports issues with liquids "coming back up to throat" after swallowing.  She reports issues within the last few months with gradual onset.  Intermittent presentation to dysphagia reported by pt.  She has h/o COPD  - smoker.  Pt currently reports much better po tolerance since her barium swallow.  Esophagram showed reflux, nothing significant in pharynx and normal esophageal motility.   Speech swallow evaluation ordered.  Type of Study: Bedside swallow evaluation Diet Prior to this Study: Regular;Thin liquids Temperature Spikes Noted: No Respiratory Status: Room air History of Recent Intubation: No Behavior/Cognition: Alert;Cooperative;Pleasant mood Oral Cavity - Dentition: Adequate natural dentition/normal for age Self-Feeding Abilities: Able to feed self Patient Positioning: Upright in chair/Tumbleform Baseline Vocal Quality: Normal Volitional Cough: Strong Volitional Swallow: Able to elicit    Oral/Motor/Sensory Function Overall Oral Motor/Sensory Function: Appears within functional limits for tasks assessed   Ice Chips Ice chips: Not tested   Thin Liquid Thin Liquid: Within functional limits Presentation: Straw    Nectar Thick Nectar Thick Liquid: Not tested   Honey Thick Honey Thick Liquid: Not tested   Puree Puree: Not tested   Solid   GO    Solid: Impaired Presentation: Spoon Oral Phase Impairments: Reduced lingual movement/coordination;Impaired anterior to posterior transit Oral Phase Functional Implications: Other (comment) (mild delay oral transiting due to dryness of cake, liquids assisted to faciliate clearance)       Donavan Burnet, MS [facility] SLP [phone]

## 2015-08-11 ENCOUNTER — Inpatient Hospital Stay (HOSPITAL_COMMUNITY): Payer: Medicare Other

## 2015-08-11 LAB — COMPREHENSIVE METABOLIC PANEL
ALBUMIN: 3.3 g/dL — AB (ref 3.5–5.0)
ALT: 18 U/L (ref 14–54)
ANION GAP: 10 (ref 5–15)
AST: 20 U/L (ref 15–41)
Alkaline Phosphatase: 91 U/L (ref 38–126)
BILIRUBIN TOTAL: 0.6 mg/dL (ref 0.3–1.2)
BUN: 66 mg/dL — ABNORMAL HIGH (ref 6–20)
CO2: 20 mmol/L — ABNORMAL LOW (ref 22–32)
Calcium: 9.7 mg/dL (ref 8.9–10.3)
Chloride: 100 mmol/L — ABNORMAL LOW (ref 101–111)
Creatinine, Ser: 1.56 mg/dL — ABNORMAL HIGH (ref 0.44–1.00)
GFR calc Af Amer: 39 mL/min — ABNORMAL LOW (ref >60)
GFR, EST NON AFRICAN AMERICAN: 34 mL/min — AB (ref >60)
Glucose, Bld: 123 mg/dL — ABNORMAL HIGH (ref 65–99)
POTASSIUM: 4.6 mmol/L (ref 3.5–5.1)
Sodium: 130 mmol/L — ABNORMAL LOW (ref 135–145)
TOTAL PROTEIN: 8.6 g/dL — AB (ref 6.5–8.1)

## 2015-08-11 LAB — CBC
HEMATOCRIT: 43 % (ref 36.0–46.0)
Hemoglobin: 13.5 g/dL (ref 12.0–15.0)
MCH: 21.6 pg — ABNORMAL LOW (ref 26.0–34.0)
MCHC: 31.4 g/dL (ref 30.0–36.0)
MCV: 68.9 fL — AB (ref 78.0–100.0)
PLATELETS: 509 10*3/uL — AB (ref 150–400)
RBC: 6.24 MIL/uL — ABNORMAL HIGH (ref 3.87–5.11)
RDW: 18.8 % — AB (ref 11.5–15.5)
WBC: 14 10*3/uL — ABNORMAL HIGH (ref 4.0–10.5)

## 2015-08-11 MED ORDER — HYDROCORTISONE ACETATE 25 MG RE SUPP
25.0000 mg | Freq: Two times a day (BID) | RECTAL | Status: DC
  Administered 2015-08-11 (×3): 25 mg via RECTAL
  Filled 2015-08-11 (×3): qty 1

## 2015-08-11 MED ORDER — SODIUM CHLORIDE 0.9 % IV SOLN
INTRAVENOUS | Status: DC
  Administered 2015-08-11: 12:00:00 via INTRAVENOUS

## 2015-08-11 MED ORDER — SODIUM CHLORIDE 0.9 % IV SOLN
INTRAVENOUS | Status: AC
  Administered 2015-08-11: 21:00:00 via INTRAVENOUS
  Administered 2015-08-11: 100 mL/h via INTRAVENOUS

## 2015-08-11 NOTE — Progress Notes (Addendum)
[facility] PROGRESS NOTE  [name] XMI:[id number] DOB: Mar 26, 1951 DOA: 08/07/2015 PCP: [name], MD  Assessment/Plan: Principal Problem:   CAP (community acquired pneumonia) Active Problems:   TOBACCO USER   Essential hypertension   Atrial fibrillation   CKD (chronic kidney disease) stage 3, GFR 30-59 ml/min   Chronic diastolic heart failure   COPD exacerbation      Community acquired pneumonia-patient has had significant improvement since admission, unfortunately her white count has gone up from 9.2-14.0, continues to be on room air , chest x-ray on 9/13 shows probable interstitial pneumonia, continue current antibiotics, continue azithromycin and Omnicef. Repeat chest x-ray today. Continue IV fluids  Dysphagia-denies any complaints of swallowing today, the therapy consultation completed,  esophagogram negative for stricture, mass, has a small hiatal hernia. Continue twice a day PPI  Acute COPD exacerbation-active smoker, increased interstitial markings, likely has COPD, continue prednisone  Atrial fibrillation-rate controlled on amiodarone, tachycardia improved overnight with IV hydration  Essential hypertension-blood pressure improved, continue Norvasc at 10 mg  Acute on chronic diastolic heart failure, last 2-D echo was in 2013 that showed a need for 55-60%, discontinued Lasix 20 mg due to increase in  her creatinine, repeat 2-D echo shows EF of 55-60%, grade 1 diastolic dysfunction  CKD (chronic kidney disease) stage 3, GFR 30-59 ml/min-creatinine has increased overnight from 1.38 > 1.56. baseline is around 1.2-1.3. Hold Lasix today and recheck creatinine is stable, patient be started on normal saline, hydrate gently overnight  Hyponatremia-likely secondary to pneumonia, dehydration secondary to Lasix, resume IV fluids  Nicotine dependence, continue nicotine patch  DVT prophylaxsis   Code Status:      Code Status Orders        Start      Ordered   08/08/15 0802  Full code   Continuous     08/08/15 0801     Family Communication: No family by the bedside Disposition Plan:  Physical therapy evaluation, anticipate discharge tomorrow if  renal function stable   Brief narrative: [name] is a 64 y.o. female with a history of COPD, HTN, CHF,Atrial fibrillation, and DM2 who presents to the ED with complaints of SOB, Cough and Fevers and Chills x 1 day. She reports having wheezing off and on x 1 week. She has been coughing up whitish sputum. She has also had nausea and vomiting but no diarrhea. In the ED she was hypoxic to 83% and was placed on 4 liters NCO2. She was also found to have fever to 101.5. A Chest X-ray was performed and revealed Interstitial Pneumonia and Mild Vascular congestion. She was placed on IV antibiotics for CAP pneumonia and referred for admission.  Consultants:  None  Procedures:  None  Antibiotics: Anti-infectives    Start     Dose/Rate Route Frequency Ordered Stop   08/10/15 2200  azithromycin (ZITHROMAX) tablet 500 mg     500 mg Oral Daily 08/10/15 1240     08/10/15 1700  cefUROXime (CEFTIN) tablet 500 mg     500 mg Oral 2 times daily with meals 08/10/15 1303     08/10/15 1245  cefdinir (OMNICEF) 125 MG/5ML suspension 250 mg  Status:  Discontinued     250 mg Oral 2 times daily 08/10/15 1240 08/10/15 1303   08/09/15 0100  azithromycin (ZITHROMAX) 500 mg in dextrose 5 % 250 mL IVPB  Status:  Discontinued     500 mg 250 mL/hr over 60 Minutes Intravenous Every 24 hours  08/08/15 0801 08/10/15 1240   08/09/15 0000  cefTRIAXone (ROCEPHIN) 1 g in dextrose 5 % 50 mL IVPB  Status:  Discontinued     1 g 100 mL/hr over 30 Minutes Intravenous Every 24 hours 08/08/15 0801 08/10/15 1240   08/08/15 0000  cefTRIAXone (ROCEPHIN) 1 g in dextrose 5 % 50 mL IVPB     1 g 100 mL/hr over 30 Minutes Intravenous  Once 08/07/15 2358 08/08/15 0114   08/08/15 0000  azithromycin (ZITHROMAX) 500 mg in  dextrose 5 % 250 mL IVPB     500 mg 250 mL/hr over 60 Minutes Intravenous  Once 08/07/15 2358 08/08/15 0230         HPI/Subjective: Patient states that her swallowing difficulty has resolved, afebrile overnight  Objective: Filed Vitals:   08/10/15 0607 08/10/15 1500 08/10/15 2107 08/11/15 0628  BP: 125/73 146/65 134/67 126/63  Pulse: 58 54 60 59  Temp: 97.7 F (36.5 C) 98.3 F (36.8 C) 98.2 F (36.8 C) 97.6 F (36.4 C)  TempSrc: Axillary Oral Oral Oral  Resp: 16 16 16 18   Height:      Weight:      SpO2: 93% 93% 92% 95%    Intake/Output Summary (Last 24 hours) at 08/11/15 1207 Last data filed at 08/11/15 0900  Gross per 24 hour  Intake    240 ml  Output      0 ml  Net    240 ml    Exam:  General: No acute respiratory distress Lungs: Clear to auscultation bilaterally without wheezes or crackles Cardiovascular: Regular rate and rhythm without murmur gallop or rub normal S1 and S2 Abdomen: Nontender, nondistended, soft, bowel sounds positive, no rebound, no ascites, no appreciable mass Extremities: No significant cyanosis, clubbing, or edema bilateral lower extremities     Data Review   Micro Results Recent Results (from the past 240 hour(s))  Culture, blood (routine x 2)     Status: None (Preliminary result)   Collection Time: 08/08/15 12:20 AM  Result Value Ref Range Status   Specimen Description BLOOD LEFT AC  Final   Special Requests BOTTLES DRAWN AEROBIC AND ANAEROBIC 9CC  Final   Culture   Final    NO GROWTH 2 DAYS Performed at [facility]    Report Status PENDING  Incomplete  Culture, blood (routine x 2)     Status: None (Preliminary result)   Collection Time: 08/08/15 12:20 AM  Result Value Ref Range Status   Specimen Description BLOOD LEFT HAND  Final   Special Requests IN PEDIATRIC BOTTLE 3CC  Final   Culture   Final    NO GROWTH 2 DAYS Performed at [facility]    Report Status PENDING  Incomplete  MRSA PCR Screening      Status: None   Collection Time: 08/08/15  9:00 AM  Result Value Ref Range Status   MRSA by PCR NEGATIVE NEGATIVE Final    Comment:        The GeneXpert MRSA Assay (FDA approved for NASAL specimens only), is one component of a comprehensive MRSA colonization surveillance program. It is not intended to diagnose MRSA infection nor to guide or monitor treatment for MRSA infections.     Radiology Reports Dg Chest 2 View  08/07/2015   CLINICAL DATA:  Cough, congestion and weakness with chest pain, nausea and vomiting 2 days.  EXAM: CHEST  2 VIEW  COMPARISON:  09/13/2014  FINDINGS: Lungs are adequately inflated with minimal prominence of  the perihilar markings and subtle basilar interstitial prominence. No lobar consolidation or effusion. Mild stable cardiomegaly. Mild degenerative change of the spine.  IMPRESSION: Findings suggesting mild vascular congestion or possible interstitial pneumonia.   Electronically Signed   By: Elberta Fortis M.D.   On: 08/07/2015 22:44   Dg Esophagus  08/10/2015   CLINICAL DATA:  Recurrent vomiting. History of congestive heart failure, chronic kidney disease, borderline diabetes and hypertension. Initial encounter.  EXAM: ESOPHOGRAM/BARIUM SWALLOW  TECHNIQUE: Single contrast examination was performed using  thin barium.  FLUOROSCOPY TIME:  Radiation Exposure Index (as provided by the fluoroscopic device):  If the device does not provide the exposure index:  Fluoroscopy Time:  49 seconds  Number of Acquired Images: All images acquired with fluoro store aside from 2 rapid image sequences through the pharynx (12 images total).  COMPARISON:  Chest radiographs 08/07/2015.  Chest CT 07/17/2011.  FINDINGS: The esophageal motility is within normal limits. There is no evidence of stricture, mass or ulceration. Rapid sequence imaging of the pharynx in the AP and lateral projections demonstrates no mucosal abnormalities. Moderate cervical spondylosis noted.  There is a small  reducible hiatal hernia. Mild gastroesophageal reflux was elicited with the water siphon test. At the conclusion of the study, a 13 mm barium tablet was administered. This passed without delay into the stomach.  IMPRESSION: 1. Small hiatal hernia with mild gastroesophageal reflux. 2. No evidence of esophageal ulceration or stricture. 3. No significant pharyngeal abnormalities.   Electronically Signed   By: Carey Bullocks M.D.   On: 08/10/2015 15:49   Mm Digital Screening Bilateral  08/06/2015   CLINICAL DATA:  Screening.  EXAM: DIGITAL SCREENING BILATERAL MAMMOGRAM WITH CAD  COMPARISON:  Previous exam(s).  ACR Breast Density Category b: There are scattered areas of fibroglandular density.  FINDINGS: There are no findings suspicious for malignancy. Images were processed with CAD.  IMPRESSION: No mammographic evidence of malignancy. A result letter of this screening mammogram will be mailed directly to the patient.  RECOMMENDATION: Screening mammogram in one year. (Code:[id number]-B-01Y)  BI-RADS CATEGORY  1: Negative.   Electronically Signed   By: Baird Lyons M.D.   On: 08/06/2015 12:09     CBC  Recent Labs Lab 08/07/15 2240 08/08/15 1004 08/10/15 0522 08/11/15 0545  WBC 12.6* 9.4 9.2 14.0*  HGB 12.4 12.8 14.2 13.5  HCT 39.8 40.1 44.3 43.0  PLT 444* 388 502* 509*  MCV 69.9* 69.1* 69.1* 68.9*  MCH 21.8* 22.1* 22.2* 21.6*  MCHC 31.2 31.9 32.1 31.4  RDW 18.6* 18.7* 19.0* 18.8*    Chemistries   Recent Labs Lab 08/07/15 2240 08/08/15 1004 08/10/15 0522 08/11/15 0545  NA 137 136 135 130*  K 4.0 3.9 5.1 4.6  CL 106 104 102 100*  CO2 21* 22 24 20*  GLUCOSE 144* 142* 126* 123*  BUN 16 15 42* 66*  CREATININE 1.05* 0.77 1.38* 1.56*  CALCIUM 9.8 9.9 10.3 9.7  AST  --   --  16 20  ALT  --   --  19 18  ALKPHOS  --   --  97 91  BILITOT  --   --  0.8 0.6   ------------------------------------------------------------------------------------------------------------------ estimated creatinine  clearance is 40.5 mL/min (by C-G formula based on Cr of 1.56). ------------------------------------------------------------------------------------------------------------------ No results for input(s): HGBA1C in the last 72 hours. ------------------------------------------------------------------------------------------------------------------ No results for input(s): CHOL, HDL, LDLCALC, TRIG, CHOLHDL, LDLDIRECT in the last 72 hours. ------------------------------------------------------------------------------------------------------------------ No results for input(s): TSH, T4TOTAL, T3FREE, THYROIDAB in the  last 72 hours.  Invalid input(s): FREET3 ------------------------------------------------------------------------------------------------------------------ No results for input(s): VITAMINB12, FOLATE, FERRITIN, TIBC, IRON, RETICCTPCT in the last 72 hours.  Coagulation profile No results for input(s): INR, PROTIME in the last 168 hours.  No results for input(s): DDIMER in the last 72 hours.  Cardiac Enzymes  Recent Labs Lab 08/08/15 1004 08/08/15 1738 08/08/15 2310  TROPONINI <0.03 <0.03 <0.03   ------------------------------------------------------------------------------------------------------------------ Invalid input(s): POCBNP   CBG: No results for input(s): GLUCAP in the last 168 hours.     Studies: Dg Esophagus  08/10/2015   CLINICAL DATA:  Recurrent vomiting. History of congestive heart failure, chronic kidney disease, borderline diabetes and hypertension. Initial encounter.  EXAM: ESOPHOGRAM/BARIUM SWALLOW  TECHNIQUE: Single contrast examination was performed using  thin barium.  FLUOROSCOPY TIME:  Radiation Exposure Index (as provided by the fluoroscopic device):  If the device does not provide the exposure index:  Fluoroscopy Time:  49 seconds  Number of Acquired Images: All images acquired with fluoro store aside from 2 rapid image sequences through the pharynx  (12 images total).  COMPARISON:  Chest radiographs 08/07/2015.  Chest CT 07/17/2011.  FINDINGS: The esophageal motility is within normal limits. There is no evidence of stricture, mass or ulceration. Rapid sequence imaging of the pharynx in the AP and lateral projections demonstrates no mucosal abnormalities. Moderate cervical spondylosis noted.  There is a small reducible hiatal hernia. Mild gastroesophageal reflux was elicited with the water siphon test. At the conclusion of the study, a 13 mm barium tablet was administered. This passed without delay into the stomach.  IMPRESSION: 1. Small hiatal hernia with mild gastroesophageal reflux. 2. No evidence of esophageal ulceration or stricture. 3. No significant pharyngeal abnormalities.   Electronically Signed   By: Carey Bullocks M.D.   On: 08/10/2015 15:49      Lab Results  Component Value Date   HGBA1C 6.2* 06/28/2013   HGBA1C 5.6 10/03/2012   HGBA1C 6.1* 05/13/2011   Lab Results  Component Value Date   LDLCALC 128* 10/02/2012   CREATININE 1.56* 08/11/2015       Scheduled Meds: . amiodarone  200 mg Oral Daily  . amLODipine  5 mg Oral Daily  . antiseptic oral rinse  7 mL Mouth Rinse BID  . azithromycin  500 mg Oral Daily  . calcium-vitamin D  1 tablet Oral Daily  . cefUROXime  500 mg Oral BID WC  . enoxaparin (LOVENOX) injection  40 mg Subcutaneous Q24H  . metoprolol succinate  25 mg Oral Daily  . montelukast  10 mg Oral QHS  . nicotine  7 mg Transdermal Daily  . pantoprazole  40 mg Oral BID  . predniSONE  50 mg Oral Q breakfast   Continuous Infusions: . sodium chloride 75 mL/hr at 08/11/15 1134    Principal Problem:   CAP (community acquired pneumonia) Active Problems:   TOBACCO USER   Essential hypertension   Atrial fibrillation   CKD (chronic kidney disease) stage 3, GFR 30-59 ml/min   Chronic diastolic heart failure   COPD exacerbation    Time spent: 45 minutes   [facility]  [facility] Pager  [phone]. If 7PM-7AM, please contact night-coverage at [email or web address], password [facility] 08/11/2015, 12:07 PM  LOS: 3 days

## 2015-08-11 NOTE — Progress Notes (Signed)
Occupational Therapy Treatment Patient Details Name: TAYLAH DUBIEL MRN: [id number] DOB: Apr 26, 1951 Today's Date: 08/11/2015    History of present illness Admitted 9/14/16for community-acquired pneumonia, atrial fibrillation with rapid ventricular response, hypoxemic respiratory failure requiring 4 L of oxygen.  Pt no longer on 02   OT comments  Goals met (verbalizes tub); no further OT needs  Follow Up Recommendations  No OT follow up;Supervision - Intermittent    Equipment Recommendations  None recommended by OT    Recommendations for Other Services      Precautions / Restrictions Precautions Precautions: Fall Restrictions Weight Bearing Restrictions: No       Mobility Bed Mobility                  Transfers     Transfers: Sit to/from Stand Sit to Stand: Independent              Balance                                   ADL       Grooming: Wash/dry hands;Independent                   Toilet Transfer: Independent;Comfort height toilet;Ambulation             General ADL Comments: verbalizes energy conservation strategies:  reviewed handout.  Did not practice similated tub:  pt feels she will be fine with this      Vision                     Perception     Praxis      Cognition   Behavior During Therapy: WFL for tasks assessed/performed Overall Cognitive Status: Within Functional Limits for tasks assessed                       Extremity/Trunk Assessment               Exercises     Shoulder Instructions       General Comments      Pertinent Vitals/ Pain       Pain Assessment: No/denies pain  Home Living                                          Prior Functioning/Environment              Frequency       Progress Toward Goals  OT Goals(current goals can now be found in the care plan section)  Progress towards OT goals: Goals met/education  completed, patient discharged from OT (verbalizes tub)     Plan      Co-evaluation                 End of Session     Activity Tolerance Patient tolerated treatment well   Patient Left in chair;with call bell/phone within reach   Nurse Communication          Time: 2863-8177 OT Time Calculation (min): 20 min  Charges: OT General Charges $OT Visit: 1 Procedure OT Treatments $Therapeutic Activity: 8-22 mins  SPENCER,MARYELLEN 08/11/2015, 9:31 AM  Lesle Chris, OTR/L [phone] 08/11/2015

## 2015-08-12 LAB — COMPREHENSIVE METABOLIC PANEL
ALK PHOS: 73 U/L (ref 38–126)
ALT: 20 U/L (ref 14–54)
AST: 17 U/L (ref 15–41)
Albumin: 3 g/dL — ABNORMAL LOW (ref 3.5–5.0)
Anion gap: 6 (ref 5–15)
BUN: 40 mg/dL — AB (ref 6–20)
CALCIUM: 9.2 mg/dL (ref 8.9–10.3)
CO2: 22 mmol/L (ref 22–32)
CREATININE: 1.21 mg/dL — AB (ref 0.44–1.00)
Chloride: 105 mmol/L (ref 101–111)
GFR, EST AFRICAN AMERICAN: 53 mL/min — AB (ref >60)
GFR, EST NON AFRICAN AMERICAN: 46 mL/min — AB (ref >60)
Glucose, Bld: 102 mg/dL — ABNORMAL HIGH (ref 65–99)
Potassium: 4.2 mmol/L (ref 3.5–5.1)
Sodium: 133 mmol/L — ABNORMAL LOW (ref 135–145)
Total Bilirubin: 0.6 mg/dL (ref 0.3–1.2)
Total Protein: 7.5 g/dL (ref 6.5–8.1)

## 2015-08-12 LAB — CBC
HCT: 41.3 % (ref 36.0–46.0)
Hemoglobin: 13.1 g/dL (ref 12.0–15.0)
MCH: 21.9 pg — AB (ref 26.0–34.0)
MCHC: 31.7 g/dL (ref 30.0–36.0)
MCV: 69.1 fL — ABNORMAL LOW (ref 78.0–100.0)
PLATELETS: 453 10*3/uL — AB (ref 150–400)
RBC: 5.98 MIL/uL — AB (ref 3.87–5.11)
RDW: 18.8 % — AB (ref 11.5–15.5)
WBC: 13 10*3/uL — AB (ref 4.0–10.5)

## 2015-08-12 MED ORDER — CEFUROXIME AXETIL 500 MG PO TABS: 500.0000 mg | ORAL_TABLET | Freq: Two times a day (BID) | ORAL | Status: DC

## 2015-08-12 MED ORDER — PREDNISONE 10 MG PO TABS: ORAL_TABLET | ORAL | Status: DC

## 2015-08-12 MED ORDER — NICOTINE 7 MG/24HR TD PT24: 7.0000 mg | MEDICATED_PATCH | Freq: Every day | TRANSDERMAL | Status: DC

## 2015-08-12 MED ORDER — AZITHROMYCIN 500 MG PO TABS: 500.0000 mg | ORAL_TABLET | Freq: Every day | ORAL | Status: DC

## 2015-08-12 MED ORDER — HYDROCORTISONE ACETATE 25 MG RE SUPP: 25.0000 mg | Freq: Two times a day (BID) | RECTAL | Status: DC

## 2015-08-12 MED ORDER — LOSARTAN POTASSIUM 25 MG PO TABS: 25.0000 mg | ORAL_TABLET | Freq: Every day | ORAL | Status: DC

## 2015-08-12 MED ORDER — METOPROLOL SUCCINATE ER 25 MG PO TB24: 25.0000 mg | ORAL_TABLET | Freq: Every day | ORAL | Status: DC

## 2015-08-12 NOTE — Discharge Summary (Signed)
Physician Discharge Summary  [name] MRN: [id number] DOB/AGE: 06-30-1951 63 y.o.  PCP: Benito Mccreedy, MD   Admit date: 08/07/2015 Discharge date: 08/12/2015  Discharge Diagnoses:    Principal Problem:   CAP (community acquired pneumonia)    TOBACCO USER   Essential hypertension   Atrial fibrillation   CKD (chronic kidney disease) stage 3, GFR 30-59 ml/min   Chronic diastolic heart failure   COPD exacerbation    Follow-up recommendations Follow-up with PCP in 3-5 days , including all  additional recommended appointments as below Follow-up CBC, CMP in 3-5 days      Medication List    STOP taking these medications        clindamycin 150 MG capsule  Commonly known as:  CLEOCIN     furosemide 40 MG tablet  Commonly known as:  LASIX      TAKE these medications        acetaminophen 500 MG tablet  Commonly known as:  TYLENOL  Take 1,000 mg by mouth daily as needed for mild pain (tooth pain).     albuterol 108 (90 BASE) MCG/ACT inhaler  Commonly known as:  PROVENTIL HFA;VENTOLIN HFA  Inhale 1-2 puffs into the lungs every 6 (six) hours as needed for wheezing or shortness of breath.     amiodarone 200 MG tablet  Commonly known as:  PACERONE  Take 200 mg by mouth daily.     amLODipine 5 MG tablet  Commonly known as:  NORVASC  Take 5 mg by mouth daily.     azithromycin 500 MG tablet  Commonly known as:  ZITHROMAX  Take 1 tablet (500 mg total) by mouth daily.     CALCIUM + D3 PO  Take 1 tablet by mouth daily.     CALCIUM-MAGNESIUM-ZINC PO  Take 1 tablet by mouth daily.     cefUROXime 500 MG tablet  Commonly known as:  CEFTIN  Take 1 tablet (500 mg total) by mouth 2 (two) times daily with a meal.     docusate sodium 100 MG capsule  Commonly known as:  COLACE  Take 100 mg by mouth daily as needed for mild constipation (constipation).     HYDROcodone-acetaminophen 5-325 MG per tablet  Commonly known as:  NORCO/VICODIN  Take 1 tablet by  mouth every 4 (four) hours as needed for moderate pain (pain).     hydrocortisone 25 MG suppository  Commonly known as:  ANUSOL-HC  Place 1 suppository (25 mg total) rectally 2 (two) times daily.     losartan 25 MG tablet  Commonly known as:  COZAAR  Take 1 tablet (25 mg total) by mouth daily.  Start taking on:  08/20/2015     metoprolol succinate 25 MG 24 hr tablet  Commonly known as:  TOPROL-XL  Take 1 tablet (25 mg total) by mouth daily.     montelukast 10 MG tablet  Commonly known as:  SINGULAIR  Take 10 mg by mouth daily.     MUCUS RELIEF CHEST CONGESTION 400 MG Tabs tablet  Generic drug:  guaifenesin  Take 400 mg by mouth every 4 (four) hours as needed (cough).     nicotine 7 mg/24hr patch  Commonly known as:  NICODERM CQ - dosed in mg/24 hr  Place 1 patch (7 mg total) onto the skin daily.     oxyCODONE-acetaminophen 5-325 MG per tablet  Commonly known as:  PERCOCET/ROXICET  Take 1 tablet by mouth every 6 (six) hours as needed for severe pain.  pantoprazole 40 MG tablet  Commonly known as:  PROTONIX  Take 40 mg by mouth 2 (two) times daily.     potassium chloride SA 20 MEQ tablet  Commonly known as:  K-DUR,KLOR-CON  Take 1 tablet (20 mEq total) by mouth 2 (two) times daily.     predniSONE 10 MG tablet  Commonly known as:  DELTASONE  4 tablets 4 days, 3 tablets 4 days, 2 tablets 4 days, 1 tablet 4 days, half tablet 4 days then discontinue     vitamin B-12 1000 MCG tablet  Commonly known as:  CYANOCOBALAMIN  Take 1,000 mcg by mouth daily.         Discharge Condition: *   Disposition: 01-Home or Self Care   Consults: * Non-    Significant Diagnostic Studies:  Dg Chest 2 View  08/11/2015   CLINICAL DATA:  Cough, shortness of Breath  EXAM: CHEST  2 VIEW  COMPARISON:  08/07/2015  FINDINGS: Cardiomediastinal silhouette is stable. Central mild bronchitic changes. No acute infiltrate or pleural effusion. No pulmonary edema. Mild degenerative changes  thoracic spine.  IMPRESSION: In the infiltrate or pulmonary edema. Central mild bronchitic changes. Degenerative changes thoracic spine.   Electronically Signed   By: Lahoma Crocker M.D.   On: 08/11/2015 13:05   Dg Chest 2 View  08/07/2015   CLINICAL DATA:  Cough, congestion and weakness with chest pain, nausea and vomiting 2 days.  EXAM: CHEST  2 VIEW  COMPARISON:  09/13/2014  FINDINGS: Lungs are adequately inflated with minimal prominence of the perihilar markings and subtle basilar interstitial prominence. No lobar consolidation or effusion. Mild stable cardiomegaly. Mild degenerative change of the spine.  IMPRESSION: Findings suggesting mild vascular congestion or possible interstitial pneumonia.   Electronically Signed   By: Marin Olp M.D.   On: 08/07/2015 22:44   Dg Esophagus  08/10/2015   CLINICAL DATA:  Recurrent vomiting. History of congestive heart failure, chronic kidney disease, borderline diabetes and hypertension. Initial encounter.  EXAM: ESOPHOGRAM/BARIUM SWALLOW  TECHNIQUE: Single contrast examination was performed using  thin barium.  FLUOROSCOPY TIME:  Radiation Exposure Index (as provided by the fluoroscopic device):  If the device does not provide the exposure index:  Fluoroscopy Time:  49 seconds  Number of Acquired Images: All images acquired with fluoro store aside from 2 rapid image sequences through the pharynx (12 images total).  COMPARISON:  Chest radiographs 08/07/2015.  Chest CT 07/17/2011.  FINDINGS: The esophageal motility is within normal limits. There is no evidence of stricture, mass or ulceration. Rapid sequence imaging of the pharynx in the AP and lateral projections demonstrates no mucosal abnormalities. Moderate cervical spondylosis noted.  There is a small reducible hiatal hernia. Mild gastroesophageal reflux was elicited with the water siphon test. At the conclusion of the study, a 13 mm barium tablet was administered. This passed without delay into the stomach.   IMPRESSION: 1. Small hiatal hernia with mild gastroesophageal reflux. 2. No evidence of esophageal ulceration or stricture. 3. No significant pharyngeal abnormalities.   Electronically Signed   By: Richardean Sale M.D.   On: 08/10/2015 15:49   Mm Digital Screening Bilateral  08/06/2015   CLINICAL DATA:  Screening.  EXAM: DIGITAL SCREENING BILATERAL MAMMOGRAM WITH CAD  COMPARISON:  Previous exam(s).  ACR Breast Density Category b: There are scattered areas of fibroglandular density.  FINDINGS: There are no findings suspicious for malignancy. Images were processed with CAD.  IMPRESSION: No mammographic evidence of malignancy. A result letter of this  screening mammogram will be mailed directly to the patient.  RECOMMENDATION: Screening mammogram in one year. (Code:[id number]-B-01Y)  BI-RADS CATEGORY  1: Negative.   Electronically Signed   By: Lillia Mountain M.D.   On: 08/06/2015 12:09      Filed Weights   08/08/15 0830  Weight: 89.4 kg (197 lb 1.5 oz)     Microbiology: Recent Results (from the past 240 hour(s))  Culture, blood (routine x 2)     Status: None (Preliminary result)   Collection Time: 08/08/15 12:20 AM  Result Value Ref Range Status   Specimen Description BLOOD LEFT AC  Final   Special Requests BOTTLES DRAWN AEROBIC AND ANAEROBIC [place]  Final   Culture   Final    NO GROWTH 3 DAYS Performed at [facility]    Report Status PENDING  Incomplete  Culture, blood (routine x 2)     Status: None (Preliminary result)   Collection Time: 08/08/15 12:20 AM  Result Value Ref Range Status   Specimen Description BLOOD LEFT HAND  Final   Special Requests IN PEDIATRIC BOTTLE 3CC  Final   Culture   Final    NO GROWTH 3 DAYS Performed at [facility]    Report Status PENDING  Incomplete  MRSA PCR Screening     Status: None   Collection Time: 08/08/15  9:00 AM  Result Value Ref Range Status   MRSA by PCR NEGATIVE NEGATIVE Final    Comment:        The GeneXpert MRSA Assay (FDA approved  for NASAL specimens only), is one component of a comprehensive MRSA colonization surveillance program. It is not intended to diagnose MRSA infection nor to guide or monitor treatment for MRSA infections.        Blood Culture    Component Value Date/Time   SDES URINE, CLEAN CATCH 08/08/2015 1009   SPECREQUEST NONE 08/08/2015 1009   CULT  08/08/2015 0020    NO GROWTH 3 DAYS Performed at [facility]  08/08/2015 0020    NO GROWTH 3 DAYS Performed at [facility] 08/09/2015 FINAL 08/08/2015 1009      Labs: Results for orders placed or performed during the hospital encounter of 08/07/15 (from the past 48 hour(s))  CBC     Status: Abnormal   Collection Time: 08/11/15  5:45 AM  Result Value Ref Range   WBC 14.0 (H) 4.0 - 10.5 K/uL   RBC 6.24 (H) 3.87 - 5.11 MIL/uL   Hemoglobin 13.5 12.0 - 15.0 g/dL   HCT 43.0 36.0 - 46.0 %   MCV 68.9 (L) 78.0 - 100.0 fL   MCH 21.6 (L) 26.0 - 34.0 pg   MCHC 31.4 30.0 - 36.0 g/dL   RDW 18.8 (H) 11.5 - 15.5 %   Platelets 509 (H) 150 - 400 K/uL  Comprehensive metabolic panel     Status: Abnormal   Collection Time: 08/11/15  5:45 AM  Result Value Ref Range   Sodium 130 (L) 135 - 145 mmol/L   Potassium 4.6 3.5 - 5.1 mmol/L   Chloride 100 (L) 101 - 111 mmol/L   CO2 20 (L) 22 - 32 mmol/L   Glucose, Bld 123 (H) 65 - 99 mg/dL   BUN 66 (H) 6 - 20 mg/dL   Creatinine, Ser 1.56 (H) 0.44 - 1.00 mg/dL   Calcium 9.7 8.9 - 10.3 mg/dL   Total Protein 8.6 (H) 6.5 - 8.1 g/dL  Albumin 3.3 (L) 3.5 - 5.0 g/dL   AST 20 15 - 41 U/L   ALT 18 14 - 54 U/L   Alkaline Phosphatase 91 38 - 126 U/L   Total Bilirubin 0.6 0.3 - 1.2 mg/dL   GFR calc non Af Amer 34 (L) >60 mL/min   GFR calc Af Amer 39 (L) >60 mL/min    Comment: (NOTE) The eGFR has been calculated using the CKD EPI equation. This calculation has not been validated in all clinical situations. eGFR's persistently <60 mL/min signify possible Chronic  Kidney Disease.    Anion gap 10 5 - 15  Comprehensive metabolic panel     Status: Abnormal   Collection Time: 08/12/15  6:20 AM  Result Value Ref Range   Sodium 133 (L) 135 - 145 mmol/L   Potassium 4.2 3.5 - 5.1 mmol/L   Chloride 105 101 - 111 mmol/L   CO2 22 22 - 32 mmol/L   Glucose, Bld 102 (H) 65 - 99 mg/dL   BUN 40 (H) 6 - 20 mg/dL   Creatinine, Ser 1.21 (H) 0.44 - 1.00 mg/dL   Calcium 9.2 8.9 - 10.3 mg/dL   Total Protein 7.5 6.5 - 8.1 g/dL   Albumin 3.0 (L) 3.5 - 5.0 g/dL   AST 17 15 - 41 U/L   ALT 20 14 - 54 U/L   Alkaline Phosphatase 73 38 - 126 U/L   Total Bilirubin 0.6 0.3 - 1.2 mg/dL   GFR calc non Af Amer 46 (L) >60 mL/min   GFR calc Af Amer 53 (L) >60 mL/min    Comment: (NOTE) The eGFR has been calculated using the CKD EPI equation. This calculation has not been validated in all clinical situations. eGFR's persistently <60 mL/min signify possible Chronic Kidney Disease.    Anion gap 6 5 - 15  CBC     Status: Abnormal   Collection Time: 08/12/15  6:20 AM  Result Value Ref Range   WBC 13.0 (H) 4.0 - 10.5 K/uL   RBC 5.98 (H) 3.87 - 5.11 MIL/uL   Hemoglobin 13.1 12.0 - 15.0 g/dL   HCT 41.3 36.0 - 46.0 %   MCV 69.1 (L) 78.0 - 100.0 fL   MCH 21.9 (L) 26.0 - 34.0 pg   MCHC 31.7 30.0 - 36.0 g/dL   RDW 18.8 (H) 11.5 - 15.5 %   Platelets 453 (H) 150 - 400 K/uL    Comment: REPEATED TO VERIFY     Lipid Panel     Component Value Date/Time   CHOL 195 10/02/2012 0526   TRIG 115 10/02/2012 0526   HDL 44 10/02/2012 0526   CHOLHDL 4.4 10/02/2012 0526   VLDL 23 10/02/2012 0526   LDLCALC 128* 10/02/2012 0526     Lab Results  Component Value Date   HGBA1C 6.2* 06/28/2013   HGBA1C 5.6 10/03/2012   HGBA1C 6.1* 05/13/2011     Lab Results  Component Value Date   LDLCALC 128* 10/02/2012   CREATININE 1.21* 08/12/2015     HPI :[name] is a 64 y.o. female with a history of COPD, HTN, CHF,Atrial fibrillation, and DM2 who presents to the ED with  complaints of SOB, Cough and Fevers and Chills x 1 day. She reports having wheezing off and on x 1 week. She has been coughing up whitish sputum. She has also had nausea and vomiting but no diarrhea. In the ED she was hypoxic to 83% and was placed on 4 liters NCO2. She was  also found to have fever to 101.5. A Chest X-ray was performed and revealed Interstitial Pneumonia and Mild Vascular congestion. She was placed on IV antibiotics for CAP pneumonia and referred for admission.   HOSPITAL COURSE:    Community acquired pneumonia-strep pneumo antigen positive, patient has had significant improvement since admission, while persistent elevation in her white count likely secondary to steroids, patient noted to be greater than 90% on room air after ambulating 200 feet, initial chest x-ray on 9/13 shows probable interstitial pneumonia, started on Rocephin/azithromycin not changed to azithromycin and Omnicef for 5 more days   Dysphagia-denies any complaints of swallowing today, speech therapy consultation completed, esophagogram negative for stricture, mass, has a small hiatal hernia. Continue twice a day PPI, no difficulty swallowing today  Acute COPD exacerbation-active smoker, increased interstitial markings, likely has COPD, continue prednisone taper and smoking cessation counseling done  Atrial fibrillation-rate controlled on amiodarone, tachycardia improved overnight with IV hydration, stable  Essential hypertension-blood pressure improved, continue Norvasc at 10 mg. Patient advised to hold losartan until seen by PCP  Acute on chronic diastolic heart failure, last 2-D echo was in 2013 that showed a need for 55-60%,  repeat 2-D echo shows EF of 81-44%, grade 1 diastolic dysfunction. Lasix discontinued because of mild increase in her creatinine over the last 2 days. PCP may resume her renal function is stable upon follow-up.   CKD (chronic kidney disease) stage 3, GFR 30-59  ml/min-creatinine has increased overnight from 1.38 > 1.56. baseline is around 1.2-1.3. Hold Lasix and recheck creatinine if  stable, restart in the outpatient setting  Hyponatremia-likely secondary to pneumonia, dehydration secondary to Lasix, improved with IV fluid hydration  Nicotine dependence, provided with nicotine patch  Discharge Exam:    Blood pressure 134/59, pulse 58, temperature 98.2 F (36.8 C), temperature source Oral, resp. rate 18, height _0  (1.676 m), weight 89.4 kg (197 lb 1.5 oz), SpO2 92 %.   General: No acute respiratory distress Lungs: Clear to auscultation bilaterally without wheezes or crackles Cardiovascular: Regular rate and rhythm without murmur gallop or rub normal S1 and S2 Abdomen: Nontender, nondistended, soft, bowel sounds positive, no rebound, no ascites, no appreciable mass Extremities: No significant cyanosis, clubbing, or edema bilateral lower extremities      Discharge Instructions    Diet - low sodium heart healthy    Complete by:  As directed      Increase activity slowly    Complete by:  As directed            Follow-up Information    Follow up with OSEI-BONSU,GEORGE, MD. Schedule an appointment as soon as possible for a visit in 3 days.   Specialty:  Internal Medicine   Contact information:   [address] [place] [address] [phone]       Signed: Reyne Dumas 08/12/2015, 10:54 AM        Time spent >45 mins

## 2015-08-12 NOTE — Progress Notes (Signed)
SATURATION QUALIFICATIONS: (This note is used to comply with regulatory documentation for home oxygen)  Patient Saturations on Room Air at Rest = 99%  Patient Saturations on Room Air while Ambulating = 100%  Patient Saturations on 2 Liters of oxygen while Ambulating =98%  Please briefly explain why patient needs home oxygen:none

## 2015-08-13 LAB — CULTURE, BLOOD (ROUTINE X 2)
CULTURE: NO GROWTH
CULTURE: NO GROWTH

## 2015-10-07 ENCOUNTER — Observation Stay (HOSPITAL_COMMUNITY): Payer: Medicare Other

## 2015-10-07 ENCOUNTER — Emergency Department (HOSPITAL_COMMUNITY): Payer: Medicare Other

## 2015-10-07 ENCOUNTER — Observation Stay (HOSPITAL_COMMUNITY)
Admission: EM | Admit: 2015-10-07 | Discharge: 2015-10-08 | Disposition: A | Discharge status: Home / Self Care | Payer: Medicare Other | Attending: Internal Medicine | Admitting: Internal Medicine

## 2015-10-07 ENCOUNTER — Encounter (HOSPITAL_COMMUNITY): Payer: Self-pay | Admitting: Emergency Medicine

## 2015-10-07 DIAGNOSIS — K649 Unspecified hemorrhoids: Secondary | ICD-10-CM | POA: Insufficient documentation

## 2015-10-07 DIAGNOSIS — E669 Obesity, unspecified: Secondary | ICD-10-CM | POA: Diagnosis not present

## 2015-10-07 DIAGNOSIS — E785 Hyperlipidemia, unspecified: Secondary | ICD-10-CM | POA: Diagnosis not present

## 2015-10-07 DIAGNOSIS — Z880 Allergy status to penicillin: Secondary | ICD-10-CM | POA: Diagnosis not present

## 2015-10-07 DIAGNOSIS — J189 Pneumonia, unspecified organism: Secondary | ICD-10-CM | POA: Diagnosis present

## 2015-10-07 DIAGNOSIS — M13862 Other specified arthritis, left knee: Secondary | ICD-10-CM | POA: Diagnosis not present

## 2015-10-07 DIAGNOSIS — N183 Chronic kidney disease, stage 3 unspecified: Secondary | ICD-10-CM | POA: Diagnosis present

## 2015-10-07 DIAGNOSIS — F17210 Nicotine dependence, cigarettes, uncomplicated: Secondary | ICD-10-CM | POA: Diagnosis present

## 2015-10-07 DIAGNOSIS — I4891 Unspecified atrial fibrillation: Secondary | ICD-10-CM | POA: Diagnosis not present

## 2015-10-07 DIAGNOSIS — R1013 Epigastric pain: Secondary | ICD-10-CM | POA: Insufficient documentation

## 2015-10-07 DIAGNOSIS — R0600 Dyspnea, unspecified: Secondary | ICD-10-CM

## 2015-10-07 DIAGNOSIS — F419 Anxiety disorder, unspecified: Secondary | ICD-10-CM | POA: Insufficient documentation

## 2015-10-07 DIAGNOSIS — E876 Hypokalemia: Secondary | ICD-10-CM | POA: Diagnosis not present

## 2015-10-07 DIAGNOSIS — F329 Major depressive disorder, single episode, unspecified: Secondary | ICD-10-CM | POA: Diagnosis not present

## 2015-10-07 DIAGNOSIS — Z6833 Body mass index (BMI) 33.0-33.9, adult: Secondary | ICD-10-CM | POA: Diagnosis not present

## 2015-10-07 DIAGNOSIS — M13861 Other specified arthritis, right knee: Secondary | ICD-10-CM | POA: Diagnosis not present

## 2015-10-07 DIAGNOSIS — F209 Schizophrenia, unspecified: Secondary | ICD-10-CM | POA: Diagnosis not present

## 2015-10-07 DIAGNOSIS — I10 Essential (primary) hypertension: Secondary | ICD-10-CM | POA: Diagnosis present

## 2015-10-07 DIAGNOSIS — Z881 Allergy status to other antibiotic agents status: Secondary | ICD-10-CM | POA: Diagnosis not present

## 2015-10-07 DIAGNOSIS — Y95 Nosocomial condition: Secondary | ICD-10-CM | POA: Diagnosis not present

## 2015-10-07 DIAGNOSIS — I129 Hypertensive chronic kidney disease with stage 1 through stage 4 chronic kidney disease, or unspecified chronic kidney disease: Secondary | ICD-10-CM | POA: Insufficient documentation

## 2015-10-07 DIAGNOSIS — I5033 Acute on chronic diastolic (congestive) heart failure: Secondary | ICD-10-CM | POA: Diagnosis present

## 2015-10-07 DIAGNOSIS — D72829 Elevated white blood cell count, unspecified: Secondary | ICD-10-CM | POA: Diagnosis not present

## 2015-10-07 DIAGNOSIS — K219 Gastro-esophageal reflux disease without esophagitis: Secondary | ICD-10-CM | POA: Diagnosis not present

## 2015-10-07 DIAGNOSIS — J9600 Acute respiratory failure, unspecified whether with hypoxia or hypercapnia: Secondary | ICD-10-CM | POA: Diagnosis not present

## 2015-10-07 LAB — LIPASE, BLOOD: Lipase: 31 U/L (ref 11–51)

## 2015-10-07 LAB — COMPREHENSIVE METABOLIC PANEL
ALK PHOS: 58 U/L (ref 38–126)
ALT: 29 U/L (ref 14–54)
ANION GAP: 13 (ref 5–15)
AST: 25 U/L (ref 15–41)
Albumin: 4.1 g/dL (ref 3.5–5.0)
BILIRUBIN TOTAL: 1 mg/dL (ref 0.3–1.2)
BUN: 19 mg/dL (ref 6–20)
CALCIUM: 10 mg/dL (ref 8.9–10.3)
CO2: 23 mmol/L (ref 22–32)
Chloride: 104 mmol/L (ref 101–111)
Creatinine, Ser: 1.2 mg/dL — ABNORMAL HIGH (ref 0.44–1.00)
GFR calc non Af Amer: 46 mL/min — ABNORMAL LOW (ref >60)
GFR, EST AFRICAN AMERICAN: 54 mL/min — AB (ref >60)
Glucose, Bld: 113 mg/dL — ABNORMAL HIGH (ref 65–99)
POTASSIUM: 3.4 mmol/L — AB (ref 3.5–5.1)
SODIUM: 140 mmol/L (ref 135–145)
TOTAL PROTEIN: 8 g/dL (ref 6.5–8.1)

## 2015-10-07 LAB — URINALYSIS, ROUTINE W REFLEX MICROSCOPIC
BILIRUBIN URINE: NEGATIVE
Glucose, UA: NEGATIVE mg/dL
HGB URINE DIPSTICK: NEGATIVE
KETONES UR: NEGATIVE mg/dL
LEUKOCYTES UA: NEGATIVE
NITRITE: NEGATIVE
Protein, ur: NEGATIVE mg/dL
SPECIFIC GRAVITY, URINE: 1.012 (ref 1.005–1.030)
UROBILINOGEN UA: 0.2 mg/dL (ref 0.0–1.0)
pH: 8 (ref 5.0–8.0)

## 2015-10-07 LAB — CBC
HEMATOCRIT: 48 % — AB (ref 36.0–46.0)
HEMOGLOBIN: 15.6 g/dL — AB (ref 12.0–15.0)
MCH: 24 pg — AB (ref 26.0–34.0)
MCHC: 32.5 g/dL (ref 30.0–36.0)
MCV: 73.7 fL — AB (ref 78.0–100.0)
Platelets: 253 10*3/uL (ref 150–400)
RBC: 6.51 MIL/uL — AB (ref 3.87–5.11)
RDW: 23.7 % — ABNORMAL HIGH (ref 11.5–15.5)
WBC: 16.2 10*3/uL — ABNORMAL HIGH (ref 4.0–10.5)

## 2015-10-07 LAB — I-STAT TROPONIN, ED: Troponin i, poc: 0.02 ng/mL (ref 0.00–0.08)

## 2015-10-07 LAB — GLUCOSE, CAPILLARY: GLUCOSE-CAPILLARY: 129 mg/dL — AB (ref 65–99)

## 2015-10-07 LAB — STREP PNEUMONIAE URINARY ANTIGEN: Strep Pneumo Urinary Antigen: NEGATIVE

## 2015-10-07 LAB — I-STAT CG4 LACTIC ACID, ED: LACTIC ACID, VENOUS: 1.52 mmol/L (ref 0.5–2.0)

## 2015-10-07 LAB — BRAIN NATRIURETIC PEPTIDE: B NATRIURETIC PEPTIDE 5: 46.9 pg/mL (ref 0.0–100.0)

## 2015-10-07 MED ORDER — ENOXAPARIN SODIUM 40 MG/0.4ML ~~LOC~~ SOLN
40.0000 mg | SUBCUTANEOUS | Status: DC
  Administered 2015-10-07: 40 mg via SUBCUTANEOUS
  Filled 2015-10-07 (×2): qty 0.4

## 2015-10-07 MED ORDER — POTASSIUM CHLORIDE CRYS ER 20 MEQ PO TBCR
20.0000 meq | EXTENDED_RELEASE_TABLET | Freq: Two times a day (BID) | ORAL | Status: DC
Start: 2015-10-07 — End: 2015-10-08
  Administered 2015-10-07 – 2015-10-08 (×2): 20 meq via ORAL
  Filled 2015-10-07 (×4): qty 1

## 2015-10-07 MED ORDER — VANCOMYCIN HCL 10 G IV SOLR
1500.0000 mg | Freq: Once | INTRAVENOUS | Status: AC
  Administered 2015-10-07: 1500 mg via INTRAVENOUS
  Filled 2015-10-07: qty 1500

## 2015-10-07 MED ORDER — HYDROMORPHONE HCL 1 MG/ML IJ SOLN
1.0000 mg | INTRAMUSCULAR | Status: DC | PRN
  Administered 2015-10-07 – 2015-10-08 (×4): 1 mg via INTRAVENOUS
  Filled 2015-10-07 (×4): qty 1

## 2015-10-07 MED ORDER — VANCOMYCIN HCL IN DEXTROSE 750-5 MG/150ML-% IV SOLN
750.0000 mg | Freq: Two times a day (BID) | INTRAVENOUS | Status: DC
  Administered 2015-10-07: 750 mg via INTRAVENOUS
  Filled 2015-10-07 (×3): qty 150

## 2015-10-07 MED ORDER — GUAIFENESIN ER 600 MG PO TB12
600.0000 mg | ORAL_TABLET | Freq: Two times a day (BID) | ORAL | Status: DC
  Administered 2015-10-07 – 2015-10-08 (×2): 600 mg via ORAL
  Filled 2015-10-07 (×4): qty 1

## 2015-10-07 MED ORDER — IPRATROPIUM-ALBUTEROL 0.5-2.5 (3) MG/3ML IN SOLN
3.0000 mL | Freq: Once | RESPIRATORY_TRACT | Status: AC
  Administered 2015-10-07: 3 mL via RESPIRATORY_TRACT
  Filled 2015-10-07: qty 3

## 2015-10-07 MED ORDER — MORPHINE SULFATE (PF) 4 MG/ML IV SOLN
4.0000 mg | Freq: Once | INTRAVENOUS | Status: AC
  Administered 2015-10-07: 4 mg via INTRAVENOUS
  Filled 2015-10-07: qty 1

## 2015-10-07 MED ORDER — AMIODARONE HCL 200 MG PO TABS
200.0000 mg | ORAL_TABLET | Freq: Every day | ORAL | Status: DC
  Administered 2015-10-08: 200 mg via ORAL
  Filled 2015-10-07 (×2): qty 1

## 2015-10-07 MED ORDER — MONTELUKAST SODIUM 10 MG PO TABS
10.0000 mg | ORAL_TABLET | Freq: Every day | ORAL | Status: DC
  Administered 2015-10-08: 10 mg via ORAL
  Filled 2015-10-07 (×2): qty 1

## 2015-10-07 MED ORDER — ONDANSETRON HCL 4 MG/2ML IJ SOLN
4.0000 mg | Freq: Four times a day (QID) | INTRAMUSCULAR | Status: DC | PRN
  Administered 2015-10-07: 4 mg via INTRAVENOUS
  Filled 2015-10-07: qty 2

## 2015-10-07 MED ORDER — LEVALBUTEROL HCL 0.63 MG/3ML IN NEBU: 0.6300 mg | INHALATION_SOLUTION | Freq: Four times a day (QID) | RESPIRATORY_TRACT | Status: DC | PRN

## 2015-10-07 MED ORDER — FUROSEMIDE 40 MG PO TABS
40.0000 mg | ORAL_TABLET | Freq: Every day | ORAL | Status: DC
  Administered 2015-10-08: 40 mg via ORAL
  Filled 2015-10-07: qty 1

## 2015-10-07 MED ORDER — TEMAZEPAM 15 MG PO CAPS: 15.0000 mg | ORAL_CAPSULE | Freq: Every evening | ORAL | Status: DC | PRN

## 2015-10-07 MED ORDER — SODIUM CHLORIDE 0.9 % IJ SOLN
3.0000 mL | Freq: Two times a day (BID) | INTRAMUSCULAR | Status: DC
  Administered 2015-10-07 (×2): 3 mL via INTRAVENOUS

## 2015-10-07 MED ORDER — DEXTROSE 5 % IV SOLN
1.0000 g | Freq: Once | INTRAVENOUS | Status: AC
  Administered 2015-10-07: 1 g via INTRAVENOUS
  Filled 2015-10-07: qty 10

## 2015-10-07 MED ORDER — PANTOPRAZOLE SODIUM 40 MG PO TBEC
40.0000 mg | DELAYED_RELEASE_TABLET | Freq: Every day | ORAL | Status: DC
  Administered 2015-10-08: 40 mg via ORAL
  Filled 2015-10-07 (×2): qty 1

## 2015-10-07 MED ORDER — HYDROCODONE-ACETAMINOPHEN 5-325 MG PO TABS
1.0000 | ORAL_TABLET | ORAL | Status: DC | PRN
Start: 2015-10-07 — End: 2015-10-08
  Filled 2015-10-07 (×2): qty 1

## 2015-10-07 MED ORDER — AMLODIPINE BESYLATE 5 MG PO TABS
5.0000 mg | ORAL_TABLET | Freq: Every day | ORAL | Status: DC
  Administered 2015-10-08: 5 mg via ORAL
  Filled 2015-10-07 (×2): qty 1

## 2015-10-07 MED ORDER — DEXTROSE 5 % IV SOLN
2.0000 g | Freq: Three times a day (TID) | INTRAVENOUS | Status: DC
  Administered 2015-10-07 – 2015-10-08 (×3): 2 g via INTRAVENOUS
  Filled 2015-10-07 (×4): qty 2

## 2015-10-07 MED ORDER — ACETAMINOPHEN 500 MG PO TABS: 1000.0000 mg | ORAL_TABLET | Freq: Every day | ORAL | Status: DC | PRN

## 2015-10-07 MED ORDER — ATORVASTATIN CALCIUM 40 MG PO TABS
40.0000 mg | ORAL_TABLET | Freq: Every day | ORAL | Status: DC
  Administered 2015-10-08: 40 mg via ORAL
  Filled 2015-10-07 (×2): qty 1

## 2015-10-07 MED ORDER — ONDANSETRON HCL 4 MG PO TABS: 4.0000 mg | ORAL_TABLET | Freq: Four times a day (QID) | ORAL | Status: DC | PRN

## 2015-10-07 MED ORDER — ONDANSETRON HCL 4 MG/2ML IJ SOLN
4.0000 mg | Freq: Once | INTRAMUSCULAR | Status: AC
  Administered 2015-10-07: 4 mg via INTRAVENOUS
  Filled 2015-10-07: qty 2

## 2015-10-07 NOTE — Progress Notes (Signed)
  ANTIBIOTIC CONSULT NOTE - INITIAL  Pharmacy Consult for vancomycin Indication: pneumonia  Allergies  Allergen Reactions  . Levaquin [Levofloxacin] Hives  . Penicillins Swelling    Has patient had a PCN reaction causing immediate rash, facial/tongue/throat swelling, SOB or lightheadedness with hypotension yes Has patient had a PCN reaction causing severe rash involving mucus membranes or skin necrosis: unknown Has patient had a PCN reaction that required hospitalization yes Has patient had a PCN reaction occurring within the last 10 years: no If all of the above answers are "NO", then may proceed with Cephalosporin use.     Patient Measurements: Weight: 205 lb 1 oz (93.016 kg) Adjusted Body Weight:   Vital Signs: Temp: 97.6 F (36.4 C) (11/13 0946) Temp Source: Oral (11/13 0946) BP: 158/83 mmHg (11/13 1210) Pulse Rate: 83 (11/13 1210) Intake/Output from previous day:   Intake/Output from this shift:    Labs:  Recent Labs  10/07/15 1013 10/07/15 1111  WBC 16.2*  --   HGB 15.6*  --   PLT 253  --   CREATININE  --  1.20*   Estimated Creatinine Clearance: 53.7 mL/min (by C-G formula based on Cr of 1.2). No results for input(s): VANCOTROUGH, VANCOPEAK, VANCORANDOM, GENTTROUGH, GENTPEAK, GENTRANDOM, TOBRATROUGH, TOBRAPEAK, TOBRARND, AMIKACINPEAK, AMIKACINTROU, AMIKACIN in the last 72 hours.   Microbiology: No results found for this or any previous visit (from the past 720 hour(s)).  Medical History: Past Medical History  Diagnosis Date  . Hypertension   . Borderline diabetes   . Irregular heart beat   . Hyperlipidemia   . CHF (congestive heart failure) (HCC)   . CKD (chronic kidney disease) stage 3, GFR 30-59 ml/min   . Dysrhythmia     A-fib  . Anxiety   . Depression   . Shortness of breath     on exertion  . GERD (gastroesophageal reflux disease)   . Headache(784.0)   . Arthritis     knees  . Anemia     Medications:  Scheduled:  . [START ON  10/08/2015] vancomycin  750 mg Intravenous Q12H   Assessment: Pt is a 64 yo woman with hx/o HTN, afib, pna. Pt complains of  chest pain, vomiting, SOB.The chest pain is waxing and waning, worse with deep breaths. She has associated diarrhea. No fevers, dysuria, cough. She has mild epigastric abdominal pain. She experienced similar sxs a month ago and was dx with pna.Crcl 53 ml/min.    Goal of Therapy:  Vancomycin trough level 15-20 mcg/ml  Plan:  Follow up culture results  Start vancomycin 1500 mg IV x1, then start vancomycin 750 mg IV q12h.  Adalberto Cole, PharmD, BCPS Pager [phone] 10/07/2015 12:23 PM

## 2015-10-07 NOTE — ED Provider Notes (Signed)
CSN: [id number]     Arrival date & time 10/07/15  4696 History   First MD Initiated Contact with Patient 10/07/15 0957     Chief Complaint  Patient presents with  . Chest Pain  . Shortness of Breath     The history is provided by the patient. No language interpreter was used.   [name] is a 64 yo woman with hx/o HTN, afib, pna here for evaluation of chest pain, vomiting, SOB.  Her sxs started at 5 am this worsening.  She developed severe nausea/vomiting, left sided chest pain and SOB.  The chest pain is waxing and waning, worse with deep breaths.  She has associated diarrhea. No fevers, dysuria, cough.  She has mild epigastric abdominal pain.  She just came back from [place] late yesterday.  She reports mild lower extremity edema.  No known sick contacts.  She experienced similar sxs a month ago and was dx with pna.    Past Medical History  Diagnosis Date  . Hypertension   . Borderline diabetes   . Irregular heart beat   . Hyperlipidemia   . CHF (congestive heart failure) (HCC)   . CKD (chronic kidney disease) stage 3, GFR 30-59 ml/min   . Dysrhythmia     A-fib  . Anxiety   . Depression   . Shortness of breath     on exertion  . GERD (gastroesophageal reflux disease)   . Headache(784.0)   . Arthritis     knees  . Anemia    Past Surgical History  Procedure Laterality Date  . Hemorrhoid surgery    . Esophagogastroduodenoscopy N/A 02/25/2013    Procedure: ESOPHAGOGASTRODUODENOSCOPY (EGD);  Surgeon: Theda Belfast, MD;  Location: [facility];  Service: Endoscopy;  Laterality: N/A;  . Colonoscopy N/A 02/25/2013    Procedure: COLONOSCOPY;  Surgeon: Theda Belfast, MD;  Location: [facility];  Service: Endoscopy;  Laterality: N/A;  . Givens capsule study N/A 02/26/2013    Procedure: GIVENS CAPSULE STUDY;  Surgeon: Theda Belfast, MD;  Location: [facility];  Service: Endoscopy;  Laterality: N/A;   Family History  Problem Relation Age of Onset  . Kidney disease Mother   . Heart  attack Mother   . Kidney disease Father   . Hypertension Mother   . Breast cancer Daughter   . Heart disease Brother    Social History  Substance Use Topics  . Smoking status: Current Every Day Smoker -- 0.25 packs/day for 45 years    Types: Cigarettes  . Smokeless tobacco: Current User  . Alcohol Use: No   OB History    No data available     Review of Systems  All other systems reviewed and are negative.     Allergies  Levaquin and Penicillins  Home Medications   Prior to Admission medications   Medication Sig Start Date End Date Taking? Authorizing Provider  acetaminophen (TYLENOL) 500 MG tablet Take 1,000 mg by mouth daily as needed for mild pain (tooth pain).     Historical Provider, MD  albuterol (PROVENTIL HFA;VENTOLIN HFA) 108 (90 BASE) MCG/ACT inhaler Inhale 1-2 puffs into the lungs every 6 (six) hours as needed for wheezing or shortness of breath.    Historical Provider, MD  amiodarone (PACERONE) 200 MG tablet Take 200 mg by mouth daily.    Historical Provider, MD  amLODipine (NORVASC) 5 MG tablet Take 5 mg by mouth daily.    Historical Provider, MD  azithromycin (ZITHROMAX) 500 MG tablet Take  1 tablet (500 mg total) by mouth daily. 08/12/15   Richarda Overlie, MD  Calcium Carb-Cholecalciferol (CALCIUM + D3 PO) Take 1 tablet by mouth daily.    Historical Provider, MD  CALCIUM-MAGNESIUM-ZINC PO Take 1 tablet by mouth daily.    Historical Provider, MD  cefUROXime (CEFTIN) 500 MG tablet Take 1 tablet (500 mg total) by mouth 2 (two) times daily with a meal. 08/12/15   Richarda Overlie, MD  docusate sodium (COLACE) 100 MG capsule Take 100 mg by mouth daily as needed for mild constipation (constipation).     Historical Provider, MD  guaifenesin (MUCUS RELIEF CHEST CONGESTION) 400 MG TABS tablet Take 400 mg by mouth every 4 (four) hours as needed (cough).    Historical Provider, MD  HYDROcodone-acetaminophen (NORCO/VICODIN) 5-325 MG per tablet Take 1 tablet by mouth every 4 (four)  hours as needed for moderate pain (pain).    Historical Provider, MD  hydrocortisone (ANUSOL-HC) 25 MG suppository Place 1 suppository (25 mg total) rectally 2 (two) times daily. 08/12/15   Richarda Overlie, MD  losartan (COZAAR) 25 MG tablet Take 1 tablet (25 mg total) by mouth daily. 08/20/15   Richarda Overlie, MD  metoprolol succinate (TOPROL-XL) 25 MG 24 hr tablet Take 1 tablet (25 mg total) by mouth daily. 08/12/15   Richarda Overlie, MD  montelukast (SINGULAIR) 10 MG tablet Take 10 mg by mouth daily.    Historical Provider, MD  nicotine (NICODERM CQ - DOSED IN MG/24 HR) 7 mg/24hr patch Place 1 patch (7 mg total) onto the skin daily. 08/12/15   Richarda Overlie, MD  oxyCODONE-acetaminophen (PERCOCET/ROXICET) 5-325 MG per tablet Take 1 tablet by mouth every 6 (six) hours as needed for severe pain. Patient not taking: Reported on 08/07/2015 09/18/14   Charlestine Night, PA-C  pantoprazole (PROTONIX) 40 MG tablet Take 40 mg by mouth 2 (two) times daily.    Historical Provider, MD  potassium chloride SA (K-DUR,KLOR-CON) 20 MEQ tablet Take 1 tablet (20 mEq total) by mouth 2 (two) times daily. 05/26/12   Richarda Overlie, MD  predniSONE (DELTASONE) 10 MG tablet 4 tablets 4 days, 3 tablets 4 days, 2 tablets 4 days, 1 tablet 4 days, half tablet 4 days then discontinue 08/12/15   Richarda Overlie, MD  vitamin B-12 (CYANOCOBALAMIN) 1000 MCG tablet Take 1,000 mcg by mouth daily.    Historical Provider, MD   BP 154/83 mmHg  Pulse 74  Temp(Src) 97.6 F (36.4 C) (Oral)  Resp 29  SpO2 100% Physical Exam  Constitutional: She is oriented to person, place, and time. She appears well-developed and well-nourished. She appears distressed.  HENT:  Head: Normocephalic and atraumatic.  Cardiovascular: Normal rate and regular rhythm.   No murmur heard. Pulmonary/Chest:  Tachypnea with occasional rhonchi bilaterally  Abdominal: Soft. There is no tenderness. There is no rebound and no guarding.  Musculoskeletal: She exhibits no edema  or tenderness.  Neurological: She is alert and oriented to person, place, and time.  Skin: Skin is warm and dry.  Psychiatric:  anxious  Nursing note and vitals reviewed.   ED Course  Procedures (including critical care time) Labs Review Labs Reviewed  CBC - Abnormal; Notable for the following:    WBC 16.2 (*)    RBC 6.51 (*)    Hemoglobin 15.6 (*)    HCT 48.0 (*)    MCV 73.7 (*)    MCH 24.0 (*)    RDW 23.7 (*)    All other components within normal limits  COMPREHENSIVE METABOLIC  PANEL - Abnormal; Notable for the following:    Potassium 3.4 (*)    Glucose, Bld 113 (*)    Creatinine, Ser 1.20 (*)    GFR calc non Af Amer 46 (*)    GFR calc Af Amer 54 (*)    All other components within normal limits  BASIC METABOLIC PANEL - Abnormal; Notable for the following:    Glucose, Bld 101 (*)    BUN 21 (*)    Creatinine, Ser 1.56 (*)    GFR calc non Af Amer 34 (*)    GFR calc Af Amer 39 (*)    All other components within normal limits  CBC - Abnormal; Notable for the following:    WBC 15.5 (*)    RBC 6.13 (*)    MCV 73.4 (*)    MCH 23.7 (*)    RDW 23.4 (*)    All other components within normal limits  GLUCOSE, CAPILLARY - Abnormal; Notable for the following:    Glucose-Capillary 129 (*)    All other components within normal limits  CULTURE, BLOOD (ROUTINE X 2)  CULTURE, BLOOD (ROUTINE X 2)  CULTURE, EXPECTORATED SPUTUM-ASSESSMENT  GRAM STAIN  URINALYSIS, ROUTINE W REFLEX MICROSCOPIC (NOT AT [facility])  LIPASE, BLOOD  BRAIN NATRIURETIC PEPTIDE  STREP PNEUMONIAE URINARY ANTIGEN  HIV ANTIBODY (ROUTINE TESTING)  LEGIONELLA PNEUMOPHILA SEROGP 1 UR AG  I-STAT TROPOININ, ED  I-STAT CG4 LACTIC ACID, ED    Imaging Review Ct Chest Wo Contrast  10/07/2015  CLINICAL DATA:  Worsening dyspnea over 2 days EXAM: CT CHEST WITHOUT CONTRAST TECHNIQUE: Multidetector CT imaging of the chest was performed following the standard protocol without IV contrast. COMPARISON:  07/17/2011, 10/07/2015  FINDINGS: The lungs are well aerated bilaterally and demonstrate some changes consist with bronchiectasis in the lower lobes. This has progressed somewhat in the interval from the prior CT of 2012. Additionally some emphysematous changes and mild fibrotic changes are seen. No focal confluent infiltrate or sizable effusion is noted. Mild peribronchial thickening is noted likely related to some bronchitis. The thoracic inlet is within normal limits. The thoracic aorta demonstrates calcifications although no aneurysmal dilatation is noted. No hilar or mediastinal adenopathy is noted. The upper abdomen is within normal limits. No bony abnormality seen. IMPRESSION: Mild chronic emphysematous changes. Changes of bronchiectasis and bronchial thickening consistent with some mild bronchitis. No focal confluent infiltrate is seen. Electronically Signed   By: Alcide Clever M.D.   On: 10/07/2015 16:27   Dg Chest Port 1 View  10/07/2015  CLINICAL DATA:  Shortness of breath, chills, hypertension, history CHF EXAM: PORTABLE CHEST 1 VIEW COMPARISON:  08/11/2015 FINDINGS: Increased cardiomegaly with central vascular congestion and bibasilar interstitial opacities, worse on the right. Basilar edema pattern is favored over pneumonia. No significant effusion or pneumothorax. Trachea midline. Scoliosis and degenerative changes of the spine evident. Atherosclerosis of the aorta. IMPRESSION: Increased cardiomegaly with asymmetric basilar interstitial opacities, edema is favored over pneumonia. No significant effusion or pneumothorax. Electronically Signed   By: Judie Petit.  Shick M.D.   On: 10/07/2015 10:25   I have personally reviewed and evaluated these images and lab results as part of my medical decision-making.   EKG Interpretation   Date/Time:  Sunday October 07 2015 09:45:52 EST Ventricular Rate:  76 PR Interval:  152 QRS Duration: 88 QT Interval:  434 QTC Calculation: 488 R Axis:   8 Text Interpretation:  Sinus rhythm  Left atrial enlargement Borderline  prolonged QT interval Confirmed by Lincoln Brigham [phone])  on 10/07/2015  10:05:33 AM      MDM   Final diagnoses:  Dyspnea    Patient here with shortness of breath. Chest x-ray concerning for pneumonia given patient's symptoms, leukocytosis. Treating with antibiotics for HCAP.  Patient appears euvolemic on examination. Respirations improved after nebulizer treatment times one. Plan to admit for further treatment.    Tilden Fossa, MD 10/08/15 [phone]

## 2015-10-07 NOTE — Progress Notes (Signed)
PHARMACY NOTE -  ANTIBIOTIC RENAL DOSE ADJUSTMENT   Request received for Pharmacy to assist with antibiotic renal dose adjustment.  Patient has been initiated on Aztreonam 2g q8h x 7 days for HCAP. SCr 1.2, estimated CrCl 53 ml/min Current dosage is appropriate and need for further dosage adjustment appears unlikely at present. Will continue to monitor and adjust as needed. Continue Vancomycin per previous pharmacist consult note.  Loralee Pacas, PharmD, BCPS Pager: [phone] 10/07/2015 2:58 PM

## 2015-10-07 NOTE — ED Notes (Signed)
Pt refused Norco and refused nausea medicine

## 2015-10-07 NOTE — ED Notes (Signed)
Pt states she woke up at 0500 this morning with CP and SOB.  Tachypnea in triage but VSS.

## 2015-10-07 NOTE — H&P (Addendum)
[facility] History and Physical  [name] [facility]:[id number] DOB: 03/11/1951 DOA: 10/07/2015  Referring physician: ED physician, Dr. Madilyn Hook  PCP: [name], MD   Chief Complaint: dyspnea   HPI:  Pt is 64 yo female who presented to [facility] ED with main concern of 1-2 days duration of progressively worsening dyspnea associate with mix episodes of non productive and productive cough of clear sputum, associated with left sided chest discomfort that is present with coughing spells, subjective fever, chills. Pt reports she has just came back from [place] yesterday. She was recently hospitalized for PNA in September this year. Pt denies abd or urinary concerns.   In ED, pt noted to be hemodynamically stable, VSS, blood work notable for WBC 16 K, imaging studies worrisome for ? Developing PNA. [facility] asked to admit for further evaluation.   Assessment and Plan:  Principal Problem:   Acute respiratory failure (HCC) - secondary to acute on chronic diastolic CHF and HCAP - place on Lasix 40 mg PO QD, also ABX for pneumonia - admit to telemetry unit - provide oxygen to keep O2 sat > 92%  Active Problems:   Acute on chronic diastolic CHF (congestive heart failure) (HCC) - last 2 D ECHO 07/2015 wit diastolic grade I CHF and normal EF 60% - continue with Lasix but will change home dose of 20 mg to 40 mg PO QD - weight is 93 kg on admission - continue to monitor daily weights and strict I/O   Schizophrenia (HCC) - was agitated on ED initially but now more stable and calm - monitor    HCAP (healthcare-associated pneumonia) - pneumonia order set in place - follow up on sputum culture, urine legionella and strep penumo - continue ABX vanc and aztreonam and narrow down as clinically indicated    Hypokalemia - supplement and repeat BMP in AM   TOBACCO USER - since pt more agitated, will hold off on consultation but will attempt in next 24 hours when pt more stable    Atrial  fibrillation (HCC), CHADS 2 score ~3 - on amiodarone at home, not on [facility] and pt unable to tell why  - keep on telemetry  - pt currently in sinus rhythm    Leukocytosis - secondary to HCAP - ABX as noted above    CKD (chronic kidney disease) stage 3, GFR 30-59 ml/min - at baseline Cr 1.3 - 1.5 - continue to monitor as pt is on Lasix  - hold any additional nephrotoxins    Essential hypertension - reasonably stable so far   Obesity  - Body mass index is 33.11 kg/(m^2).  DVT prophylaxis - Lovenox SQ  Radiological Exams on Admission: Dg Chest Port 1 View 10/07/2015  Increased cardiomegaly with asymmetric basilar interstitial opacities, edema is favored over pneumonia.   Code Status: Full Family Communication: Pt at bedside Disposition Plan: Admit for further evaluation    Danie Binder [facility] [phone]   Review of Systems:  Difficult to obtain as pt now more agitated     Past Medical History  Diagnosis Date  . Hypertension   . Borderline diabetes   . Irregular heart beat   . Hyperlipidemia   . CHF (congestive heart failure) (HCC)   . CKD (chronic kidney disease) stage 3, GFR 30-59 ml/min   . Dysrhythmia     A-fib  . Anxiety   . Depression   . Shortness of breath     on exertion  . GERD (gastroesophageal reflux disease)   .  Headache(784.0)   . Arthritis     knees  . Anemia     Past Surgical History  Procedure Laterality Date  . Hemorrhoid surgery    . Esophagogastroduodenoscopy N/A 02/25/2013    Procedure: ESOPHAGOGASTRODUODENOSCOPY (EGD);  Surgeon: Theda Belfast, MD;  Location: [facility];  Service: Endoscopy;  Laterality: N/A;  . Colonoscopy N/A 02/25/2013    Procedure: COLONOSCOPY;  Surgeon: Theda Belfast, MD;  Location: [facility];  Service: Endoscopy;  Laterality: N/A;  . Givens capsule study N/A 02/26/2013    Procedure: GIVENS CAPSULE STUDY;  Surgeon: Theda Belfast, MD;  Location: [facility];  Service: Endoscopy;  Laterality: N/A;    Social History:   reports that she has been smoking Cigarettes.  She has a 11.25 pack-year smoking history. She uses smokeless tobacco. She reports that she uses illicit drugs (Marijuana). She reports that she does not drink alcohol.  Allergies  Allergen Reactions  . Levaquin [Levofloxacin] Hives  . Penicillins Swelling    Has patient had a PCN reaction causing immediate rash, facial/tongue/throat swelling, SOB or lightheadedness with hypotension yes Has patient had a PCN reaction causing severe rash involving mucus membranes or skin necrosis: unknown Has patient had a PCN reaction that required hospitalization yes Has patient had a PCN reaction occurring within the last 10 years: no If all of the above answers are "NO", then may proceed with Cephalosporin use.     Family History  Problem Relation Age of Onset  . Kidney disease Mother   . Heart attack Mother   . Kidney disease Father   . Hypertension Mother   . Breast cancer Daughter   . Heart disease Brother     Prior to Admission medications   Medication Sig Start Date End Date Taking? Authorizing Provider  acetaminophen (TYLENOL) 500 MG tablet Take 1,000 mg by mouth daily as needed for mild pain (tooth pain).    Yes Historical Provider, MD  albuterol (PROVENTIL HFA;VENTOLIN HFA) 108 (90 BASE) MCG/ACT inhaler Inhale 1-2 puffs into the lungs every 6 (six) hours as needed for wheezing or shortness of breath.   Yes Historical Provider, MD  amiodarone (PACERONE) 200 MG tablet Take 200 mg by mouth daily.   Yes Historical Provider, MD  amLODipine (NORVASC) 5 MG tablet Take 5 mg by mouth daily.   Yes Historical Provider, MD  atorvastatin (LIPITOR) 40 MG tablet Take 40 mg by mouth daily.   Yes Historical Provider, MD  Calcium Carb-Cholecalciferol (CALCIUM + D3 PO) Take 1 tablet by mouth 2 (two) times daily.    Yes Historical Provider, MD  docusate sodium (COLACE) 100 MG capsule Take 100 mg by mouth daily as needed for mild constipation (constipation).    Yes  Historical Provider, MD  furosemide (LASIX) 40 MG tablet Take 20 mg by mouth daily.   Yes Historical Provider, MD  HYDROcodone-acetaminophen (NORCO/VICODIN) 5-325 MG per tablet Take 1 tablet by mouth every 4 (four) hours as needed for moderate pain (pain).   Yes Historical Provider, MD  montelukast (SINGULAIR) 10 MG tablet Take 10 mg by mouth daily.   Yes Historical Provider, MD  pantoprazole (PROTONIX) 40 MG tablet Take 40 mg by mouth daily.    Yes Historical Provider, MD  potassium chloride SA (K-DUR,KLOR-CON) 20 MEQ tablet Take 1 tablet (20 mEq total) by mouth 2 (two) times daily. 05/26/12  Yes Richarda Overlie, MD  predniSONE (DELTASONE) 10 MG tablet 4 tablets 4 days, 3 tablets 4 days, 2 tablets 4 days, 1 tablet  4 days, half tablet 4 days then discontinue 08/12/15  Yes Richarda Overlie, MD  shark liver oil-cocoa butter (PREPARATION H) 0.25-3-85.5 % suppository Place 1 suppository rectally as needed for hemorrhoids.   Yes Historical Provider, MD  temazepam (RESTORIL) 15 MG capsule Take 15 mg by mouth at bedtime as needed for sleep.  09/11/15  Yes Historical Provider, MD  vitamin B-12 (CYANOCOBALAMIN) 1000 MCG tablet Take 1,000 mcg by mouth 2 (two) times daily.    Yes Historical Provider, MD    Physical Exam: Filed Vitals:   10/07/15 0946 10/07/15 1054 10/07/15 1210  BP: 154/83  158/83  Pulse: 74  83  Temp: 97.6 F (36.4 C)    TempSrc: Oral    Resp: 29  16  Weight:  93.016 kg (205 lb 1 oz)   SpO2: 100%  93%    Physical Exam  Constitutional: Appears agitated at this time but able to answer some questions HENT: Normocephalic. External right and left ear normal. Oropharynx is clear and moist.  Eyes: Conjunctivae and EOM are normal. PERRLA, no scleral icterus.  Neck: Normal ROM. Neck supple. No JVD. No tracheal deviation. No thyromegaly.  CVS: RRR, S1/S2 +, no gallops, no carotid bruit.  Pulmonary: Effort and breath sounds normal, rhonchi at bases  Abdominal: Soft. BS +,  no distension,  tenderness, rebound or guarding.  Musculoskeletal: Normal range of motion. No edema and no tenderness.  Lymphadenopathy: No lymphadenopathy noted, cervical, inguinal. Neuro: Alert. Moving all 4 extremities with no problems  Skin: Skin is warm and dry. No rash noted. Not diaphoretic. No erythema. No pallor.  Psychiatric: Agitated, asking staff to leave her alone   Labs on Admission:  Basic Metabolic Panel:  Recent Labs Lab 10/07/15 1111  NA 140  K 3.4*  CL 104  CO2 23  GLUCOSE 113*  BUN 19  CREATININE 1.20*  CALCIUM 10.0   Liver Function Tests:  Recent Labs Lab 10/07/15 1111  AST 25  ALT 29  ALKPHOS 58  BILITOT 1.0  PROT 8.0  ALBUMIN 4.1    Recent Labs Lab 10/07/15 1111  LIPASE 31   CBC:  Recent Labs Lab 10/07/15 1013  WBC 16.2*  HGB 15.6*  HCT 48.0*  MCV 73.7*  PLT 253   EKG: pending   If 7PM-7AM, please contact night-coverage [email or web address] Password [email or web address] 10/07/2015, 12:51 PM

## 2015-10-07 NOTE — ED Notes (Signed)
Pt continues to hyperventilate and spit up "vomiting" according to her.

## 2015-10-07 NOTE — ED Notes (Signed)
Pt made aware of UA. Pt is non compliant with anything asked of her. She has ripped off blood pressure cuff and pulse ox. Is refusing to let me put it back on. Also refusing to try to use breathing treatment.

## 2015-10-07 NOTE — ED Notes (Signed)
Bed: WA19 Expected date:  Expected time:  Means of arrival:  Comments: 

## 2015-10-08 DIAGNOSIS — I10 Essential (primary) hypertension: Secondary | ICD-10-CM | POA: Diagnosis not present

## 2015-10-08 DIAGNOSIS — J189 Pneumonia, unspecified organism: Secondary | ICD-10-CM | POA: Diagnosis not present

## 2015-10-08 DIAGNOSIS — J9600 Acute respiratory failure, unspecified whether with hypoxia or hypercapnia: Secondary | ICD-10-CM | POA: Diagnosis not present

## 2015-10-08 DIAGNOSIS — I5033 Acute on chronic diastolic (congestive) heart failure: Secondary | ICD-10-CM | POA: Diagnosis not present

## 2015-10-08 LAB — CBC
HEMATOCRIT: 45 % (ref 36.0–46.0)
HEMOGLOBIN: 14.5 g/dL (ref 12.0–15.0)
MCH: 23.7 pg — ABNORMAL LOW (ref 26.0–34.0)
MCHC: 32.2 g/dL (ref 30.0–36.0)
MCV: 73.4 fL — AB (ref 78.0–100.0)
Platelets: 306 10*3/uL (ref 150–400)
RBC: 6.13 MIL/uL — AB (ref 3.87–5.11)
RDW: 23.4 % — ABNORMAL HIGH (ref 11.5–15.5)
WBC: 15.5 10*3/uL — AB (ref 4.0–10.5)

## 2015-10-08 LAB — BASIC METABOLIC PANEL
ANION GAP: 11 (ref 5–15)
BUN: 21 mg/dL — ABNORMAL HIGH (ref 6–20)
CHLORIDE: 102 mmol/L (ref 101–111)
CO2: 24 mmol/L (ref 22–32)
Calcium: 9.6 mg/dL (ref 8.9–10.3)
Creatinine, Ser: 1.56 mg/dL — ABNORMAL HIGH (ref 0.44–1.00)
GFR calc Af Amer: 39 mL/min — ABNORMAL LOW (ref >60)
GFR, EST NON AFRICAN AMERICAN: 34 mL/min — AB (ref >60)
Glucose, Bld: 101 mg/dL — ABNORMAL HIGH (ref 65–99)
POTASSIUM: 3.8 mmol/L (ref 3.5–5.1)
SODIUM: 137 mmol/L (ref 135–145)

## 2015-10-08 LAB — HIV ANTIBODY (ROUTINE TESTING W REFLEX): HIV SCREEN 4TH GENERATION: NONREACTIVE

## 2015-10-08 MED ORDER — LEVALBUTEROL HCL 0.63 MG/3ML IN NEBU: 0.6300 mg | INHALATION_SOLUTION | Freq: Four times a day (QID) | RESPIRATORY_TRACT | Status: DC | PRN

## 2015-10-08 MED ORDER — OXYCODONE-ACETAMINOPHEN 5-325 MG PO TABS: 1.0000 | ORAL_TABLET | Freq: Four times a day (QID) | ORAL | Status: DC | PRN

## 2015-10-08 MED ORDER — GUAIFENESIN ER 600 MG PO TB12: 600.0000 mg | ORAL_TABLET | Freq: Two times a day (BID) | ORAL | Status: DC

## 2015-10-08 MED ORDER — AZITHROMYCIN 250 MG PO TABS: 250.0000 mg | ORAL_TABLET | Freq: Every day | ORAL | Status: DC

## 2015-10-08 MED ORDER — HYDROCODONE-ACETAMINOPHEN 7.5-325 MG PO TABS
1.0000 | ORAL_TABLET | Freq: Four times a day (QID) | ORAL | Status: DC | PRN
  Administered 2015-10-08: 1 via ORAL
  Filled 2015-10-08: qty 1

## 2015-10-08 NOTE — Discharge Instructions (Signed)

## 2015-10-08 NOTE — Progress Notes (Signed)
10/08/15 1530  Reviewed discharge instructions with patient. Patient verbalized understanding of discharge instructions. Copy of discharge instructions and prescriptions given to patient.

## 2015-10-08 NOTE — Discharge Summary (Signed)
Physician Discharge Summary  [name] TKW:[id number] DOB: 20-Jul-1951 DOA: 10/07/2015  PCP: [name], MD  Admit date: 10/07/2015 Discharge date: 10/08/2015  Recommendations for Outpatient Follow-up:  1. Pt will need to follow up with PCP in 2-3 weeks post discharge 2. Please obtain BMP to evaluate electrolytes and kidney function 3. Please also check CBC to evaluate Hg and Hct levels 4. Stop Losartan until renal function stabilizes  5. Continue Zithromax to complete therapy 6. Continue prednisone taper   Discharge Diagnoses:  Principal Problem:   Acute respiratory failure (HCC) Active Problems:   Acute on chronic diastolic CHF (congestive heart failure) (HCC)   Schizophrenia (HCC)   HCAP (healthcare-associated pneumonia)   TOBACCO USER   Atrial fibrillation (HCC)   Leukocytosis   CKD (chronic kidney disease) stage 3, GFR 30-59 ml/min   Essential hypertension  Discharge Condition: Stable  Diet recommendation: Heart healthy diet discussed in details   HPI: Pt is 64 yo female who presented to [facility] ED with main concern of 1-2 days duration of progressively worsening dyspnea associate with mix episodes of non productive and productive cough of clear sputum, associated with left sided chest discomfort that is present with coughing spells, subjective fever, chills. Pt reports she has just came back from [place] yesterday. She was recently hospitalized for PNA in September this year. Pt denies abd or urinary concerns.   In ED, pt noted to be hemodynamically stable, VSS, blood work notable for WBC 16 K, imaging studies worrisome for ? Developing PNA. TRH asked to admit for further evaluation.   Assessment and Plan:  Principal Problem:  Acute respiratory failure (HCC) - secondary to acute on chronic diastolic CHF and HCAP - Lasix upon discharge, ABX for pneumonia - also continue Prednisone taper   Active Problems:  Acute on chronic diastolic CHF  (congestive heart failure) (HCC) - last 2 D ECHO 07/2015 wit diastolic grade I CHF and normal EF 60% - continue with Lasix  - weight 93 kg on admission --> 91 kg    Schizophrenia (HCC) - was agitated on ED initially but now more stable and calm   HCAP (healthcare-associated pneumonia) - pneumonia order set in place - follow up on sputum culture, urine legionella and strep penumo - continue ABX    Hypokalemia - supplemented prior discharges    TOBACCO USER - consultation cessation provided    Atrial fibrillation (HCC), CHADS 2 score ~3 - on amiodarone at home, not on [facility] and pt unable to tell why  - pt currently in sinus rhythm    Leukocytosis - secondary to HCAP - ABX as noted above    CKD (chronic kidney disease) stage 3, GFR 30-59 ml/min - at baseline Cr 1.3 - 1.5   Essential hypertension - reasonably stable so far   Obesity  - Body mass index is 33.11 kg/(m^2).   Radiological Exams on Admission: Dg Chest Port 1 View 10/07/2015 Increased cardiomegaly with asymmetric basilar interstitial opacities, edema is favored over pneumonia.   Discharge Exam: Filed Vitals:   10/08/15 0541  BP: 125/71  Pulse: 64  Temp: 98.3 F (36.8 C)  Resp: 18   Filed Vitals:   10/07/15 1210 10/07/15 1458 10/07/15 2202 10/08/15 0541  BP: 158/83 165/89 167/84 125/71  Pulse: 83  71 64  Temp:  97.7 F (36.5 C) 99.2 F (37.3 C) 98.3 F (36.8 C)  TempSrc:  Oral Oral Oral  Resp: 16 18 18 18   Height:  5\' 6"  (1.676 m)  Weight:  90.992 kg (200 lb 9.6 oz)  91.128 kg (200 lb 14.4 oz)  SpO2: 93% 100% 93% 95%    General: Pt is alert, follows commands appropriately, not in acute distress Cardiovascular: Regular rate and rhythm, no rubs, no gallops Respiratory: Clear to auscultation bilaterally, no wheezing, no crackles, no rhonchi Abdominal: Soft, non tender, non distended, bowel sounds +, no guarding  Discharge Instructions  Discharge Instructions    Diet - low sodium  heart healthy    Complete by:  As directed      Increase activity slowly    Complete by:  As directed             Medication List    STOP taking these medications        cefUROXime 500 MG tablet  Commonly known as:  CEFTIN     hydrocortisone 25 MG suppository  Commonly known as:  ANUSOL-HC     losartan 25 MG tablet  Commonly known as:  COZAAR     metoprolol succinate 25 MG 24 hr tablet  Commonly known as:  TOPROL-XL     nicotine 7 mg/24hr patch  Commonly known as:  NICODERM CQ - dosed in mg/24 hr     oxyCODONE-acetaminophen 5-325 MG tablet  Commonly known as:  PERCOCET/ROXICET      TAKE these medications        acetaminophen 500 MG tablet  Commonly known as:  TYLENOL  Take 1,000 mg by mouth daily as needed for mild pain (tooth pain).     albuterol 108 (90 BASE) MCG/ACT inhaler  Commonly known as:  PROVENTIL HFA;VENTOLIN HFA  Inhale 1-2 puffs into the lungs every 6 (six) hours as needed for wheezing or shortness of breath.     amiodarone 200 MG tablet  Commonly known as:  PACERONE  Take 200 mg by mouth daily.     amLODipine 5 MG tablet  Commonly known as:  NORVASC  Take 5 mg by mouth daily.     atorvastatin 40 MG tablet  Commonly known as:  LIPITOR  Take 40 mg by mouth daily.     azithromycin 250 MG tablet  Commonly known as:  ZITHROMAX  Take 1 tablet (250 mg total) by mouth daily.     CALCIUM + D3 PO  Take 1 tablet by mouth 2 (two) times daily.     docusate sodium 100 MG capsule  Commonly known as:  COLACE  Take 100 mg by mouth daily as needed for mild constipation (constipation).     furosemide 40 MG tablet  Commonly known as:  LASIX  Take 20 mg by mouth daily.     guaiFENesin 600 MG 12 hr tablet  Commonly known as:  MUCINEX  Take 1 tablet (600 mg total) by mouth 2 (two) times daily.     HYDROcodone-acetaminophen 5-325 MG tablet  Commonly known as:  NORCO/VICODIN  Take 1 tablet by mouth every 4 (four) hours as needed for moderate pain (pain).      levalbuterol 0.63 MG/3ML nebulizer solution  Commonly known as:  XOPENEX  Take 3 mLs (0.63 mg total) by nebulization every 6 (six) hours as needed for wheezing or shortness of breath.     montelukast 10 MG tablet  Commonly known as:  SINGULAIR  Take 10 mg by mouth daily.     pantoprazole 40 MG tablet  Commonly known as:  PROTONIX  Take 40 mg by mouth daily.     potassium chloride SA 20 MEQ  tablet  Commonly known as:  K-DUR,KLOR-CON  Take 1 tablet (20 mEq total) by mouth 2 (two) times daily.     predniSONE 10 MG tablet  Commonly known as:  DELTASONE  4 tablets 4 days, 3 tablets 4 days, 2 tablets 4 days, 1 tablet 4 days, half tablet 4 days then discontinue     shark liver oil-cocoa butter 0.25-3-85.5 % suppository  Commonly known as:  PREPARATION H  Place 1 suppository rectally as needed for hemorrhoids.     temazepam 15 MG capsule  Commonly known as:  RESTORIL  Take 15 mg by mouth at bedtime as needed for sleep.     vitamin B-12 1000 MCG tablet  Commonly known as:  CYANOCOBALAMIN  Take 1,000 mcg by mouth 2 (two) times daily.           Follow-up Information    Follow up with OSEI-BONSU,GEORGE, MD.   Specialty:  Internal Medicine   Contact information:   [address] [place] [address] [phone]        The results of significant diagnostics from this hospitalization (including imaging, microbiology, ancillary and laboratory) are listed below for reference.     Microbiology: No results found for this or any previous visit (from the past 240 hour(s)).   Labs: Basic Metabolic Panel:  Recent Labs Lab 10/07/15 1111 10/08/15 0555  NA 140 137  K 3.4* 3.8  CL 104 102  CO2 23 24  GLUCOSE 113* 101*  BUN 19 21*  CREATININE 1.20* 1.56*  CALCIUM 10.0 9.6   Liver Function Tests:  Recent Labs Lab 10/07/15 1111  AST 25  ALT 29  ALKPHOS 58  BILITOT 1.0  PROT 8.0  ALBUMIN 4.1    Recent Labs Lab 10/07/15 1111  LIPASE 31    CBC:  Recent Labs Lab 10/07/15 1013 10/08/15 0555  WBC 16.2* 15.5*  HGB 15.6* 14.5  HCT 48.0* 45.0  MCV 73.7* 73.4*  PLT 253 306    BNP (last 3 results)  Recent Labs  10/07/15 1013  BNP 46.9   CBG:  Recent Labs Lab 10/07/15 2205  GLUCAP 129*   SIGNED: Time coordinating discharge: 30 minutes  Debbora Presto, MD  [facility] 10/08/2015, 9:18 AM Pager [phone]  If 7PM-7AM, please contact night-coverage [email or web address] Password [email or web address]

## 2015-10-10 LAB — LEGIONELLA PNEUMOPHILA SEROGP 1 UR AG: L. PNEUMOPHILA SEROGP 1 UR AG: NEGATIVE

## 2015-10-12 LAB — CULTURE, BLOOD (ROUTINE X 2)
CULTURE: NO GROWTH
Culture: NO GROWTH

## 2015-10-24 ENCOUNTER — Emergency Department (HOSPITAL_COMMUNITY): Payer: Medicare Other

## 2015-10-24 ENCOUNTER — Observation Stay (HOSPITAL_COMMUNITY)
Admission: EM | Admit: 2015-10-24 | Discharge: 2015-10-24 | Discharge status: Against Medical Advice | Payer: Medicare Other | Attending: Emergency Medicine | Admitting: Emergency Medicine

## 2015-10-24 ENCOUNTER — Encounter (HOSPITAL_COMMUNITY): Payer: Self-pay

## 2015-10-24 DIAGNOSIS — I509 Heart failure, unspecified: Secondary | ICD-10-CM | POA: Diagnosis not present

## 2015-10-24 DIAGNOSIS — K219 Gastro-esophageal reflux disease without esophagitis: Secondary | ICD-10-CM | POA: Insufficient documentation

## 2015-10-24 DIAGNOSIS — Z79891 Long term (current) use of opiate analgesic: Secondary | ICD-10-CM | POA: Diagnosis not present

## 2015-10-24 DIAGNOSIS — M170 Bilateral primary osteoarthritis of knee: Secondary | ICD-10-CM | POA: Diagnosis not present

## 2015-10-24 DIAGNOSIS — E785 Hyperlipidemia, unspecified: Secondary | ICD-10-CM | POA: Insufficient documentation

## 2015-10-24 DIAGNOSIS — R0602 Shortness of breath: Secondary | ICD-10-CM | POA: Diagnosis not present

## 2015-10-24 DIAGNOSIS — Z5321 Procedure and treatment not carried out due to patient leaving prior to being seen by health care provider: Secondary | ICD-10-CM | POA: Diagnosis not present

## 2015-10-24 DIAGNOSIS — R079 Chest pain, unspecified: Principal | ICD-10-CM

## 2015-10-24 DIAGNOSIS — I4891 Unspecified atrial fibrillation: Secondary | ICD-10-CM | POA: Diagnosis not present

## 2015-10-24 DIAGNOSIS — N183 Chronic kidney disease, stage 3 (moderate): Secondary | ICD-10-CM | POA: Insufficient documentation

## 2015-10-24 DIAGNOSIS — R0902 Hypoxemia: Secondary | ICD-10-CM | POA: Diagnosis not present

## 2015-10-24 DIAGNOSIS — J449 Chronic obstructive pulmonary disease, unspecified: Secondary | ICD-10-CM | POA: Insufficient documentation

## 2015-10-24 DIAGNOSIS — F17210 Nicotine dependence, cigarettes, uncomplicated: Secondary | ICD-10-CM | POA: Diagnosis not present

## 2015-10-24 DIAGNOSIS — R9431 Abnormal electrocardiogram [ECG] [EKG]: Secondary | ICD-10-CM | POA: Diagnosis not present

## 2015-10-24 DIAGNOSIS — I130 Hypertensive heart and chronic kidney disease with heart failure and stage 1 through stage 4 chronic kidney disease, or unspecified chronic kidney disease: Secondary | ICD-10-CM | POA: Insufficient documentation

## 2015-10-24 DIAGNOSIS — Z79899 Other long term (current) drug therapy: Secondary | ICD-10-CM | POA: Insufficient documentation

## 2015-10-24 LAB — BASIC METABOLIC PANEL
ANION GAP: 8 (ref 5–15)
BUN: 21 mg/dL — AB (ref 6–20)
CALCIUM: 9.2 mg/dL (ref 8.9–10.3)
CO2: 23 mmol/L (ref 22–32)
Chloride: 105 mmol/L (ref 101–111)
Creatinine, Ser: 1.42 mg/dL — ABNORMAL HIGH (ref 0.44–1.00)
GFR calc Af Amer: 44 mL/min — ABNORMAL LOW (ref >60)
GFR, EST NON AFRICAN AMERICAN: 38 mL/min — AB (ref >60)
Glucose, Bld: 96 mg/dL (ref 65–99)
POTASSIUM: 4.1 mmol/L (ref 3.5–5.1)
SODIUM: 136 mmol/L (ref 135–145)

## 2015-10-24 LAB — BRAIN NATRIURETIC PEPTIDE: B NATRIURETIC PEPTIDE 5: 75.7 pg/mL (ref 0.0–100.0)

## 2015-10-24 LAB — CBC
HEMATOCRIT: 41.1 % (ref 36.0–46.0)
Hemoglobin: 12.8 g/dL (ref 12.0–15.0)
MCH: 23.5 pg — ABNORMAL LOW (ref 26.0–34.0)
MCHC: 31.1 g/dL (ref 30.0–36.0)
MCV: 75.6 fL — ABNORMAL LOW (ref 78.0–100.0)
PLATELETS: 327 10*3/uL (ref 150–400)
RBC: 5.44 MIL/uL — ABNORMAL HIGH (ref 3.87–5.11)
RDW: 21.6 % — AB (ref 11.5–15.5)
WBC: 10.8 10*3/uL — AB (ref 4.0–10.5)

## 2015-10-24 LAB — D-DIMER, QUANTITATIVE: D-Dimer, Quant: 0.39 ug/mL-FEU (ref 0.00–0.50)

## 2015-10-24 LAB — I-STAT TROPONIN, ED: TROPONIN I, POC: 0.02 ng/mL (ref 0.00–0.08)

## 2015-10-24 MED ORDER — OXYCODONE-ACETAMINOPHEN 5-325 MG PO TABS
1.0000 | ORAL_TABLET | Freq: Once | ORAL | Status: AC
Start: 2015-10-24 — End: 2015-10-24
  Administered 2015-10-24: 1 via ORAL
  Filled 2015-10-24: qty 1

## 2015-10-24 MED ORDER — ALBUTEROL SULFATE HFA 108 (90 BASE) MCG/ACT IN AERS
2.0000 | INHALATION_SPRAY | Freq: Once | RESPIRATORY_TRACT | Status: AC
  Administered 2015-10-24: 2 via RESPIRATORY_TRACT
  Filled 2015-10-24: qty 6.7

## 2015-10-24 NOTE — ED Notes (Signed)
PA made aware pt requesting more pain meds

## 2015-10-24 NOTE — ED Notes (Signed)
md at bedside  Pt alert and oriented x4. Respirations even and unlabored, bilateral symmetrical rise and fall of chest. Skin warm and dry. In no acute distress. Denies needs.   

## 2015-10-24 NOTE — ED Provider Notes (Addendum)
Medical screening examination/treatment/procedure(s) were conducted as a shared visit with non-physician practitioner(s) and myself.  I personally evaluated the patient during the encounter.  Heart disease presents to the emergency department today with acute onset of left upper chest pain that radiated to her jaw last night described as tightness. Had some associated shortness of breath intermittently but not persistently. Pain waxed and waned throughout the last 24 hours until today she came to be evaluated. Here she initially had what appeared to be junctional rhythm with intermittent sinus rhythm with a total rate of 44. A repeat EKG was normal. She does not take Blood pressure is normal however she did have hypoxia as well as 89% at rest and as low as 84% while ambulating. Minimal end expiratory wheezing on exam. Able speak full sentences or distress. We'll need to rule out for embolus, acute coronary syndrome, COPD exacerbation, and pneumonia for this patient. With that rhythm while discussing with cardiology.   EKG Interpretation   Date/Time:  Wednesday October 24 2015 15:27:25 EST Ventricular Rate:  70 PR Interval:  164 QRS Duration: 89 QT Interval:  425 QTC Calculation: 459 R Axis:   47 Text Interpretation:  Sinus rhythm Left atrial enlargement Confirmed by  [facility] MD, Barbara Cower [phone]) on 10/24/2015 4:13:34 PM        Marily Memos, MD 10/27/15 3295  Marily Memos, MD 10/27/15 1884

## 2015-10-24 NOTE — ED Notes (Signed)
Pt complaining that 1 percocet 5 is not going to be enough for her pain

## 2015-10-24 NOTE — ED Provider Notes (Signed)
CSN: [id number]     Arrival date & time 10/24/15  1454 History   First MD Initiated Contact with Patient 10/24/15 1519     Chief Complaint  Patient presents with  . Chest Pain    HPI   [name] is an 64 y.o. female with history of CHF, afib (not on anticoagulation), COPD, CKD, HTN, HLD who presents to the ED for evaluation of chest pain. She states she was resting at home last night watching TV when she started feeling left sided chest pain. She states that the pain has been constant, dull, and achey. It is not worse with movement or respiration. It does not radiate. She states that she has oxycodone at home for knee pain and she took one with no relief of her chest pain. She states that she has some SOB and DOE but she is unsure if it is worse than her baseline. She states she has not required her rescue inhaler. Pt also endorses several episodes of cold sweats since last night. States that this is new for her. She denies using supplemental oxygen at home. Denies N/V, abd pain, fever, chills. Denies weakness, lightheadedness, dizziness. Denies dysuria, urinary frequency/urgency.   Of note, pt had SpO2 94% on RA in triage but de-sated to 83% after ambulating to the room. She improved to 99-100% on 2L O2 by nasal cannula but decreased again down to 88-89% on RA at rest.       Past Medical History  Diagnosis Date  . Hypertension   . Borderline diabetes   . Irregular heart beat   . Hyperlipidemia   . CHF (congestive heart failure) (HCC)   . CKD (chronic kidney disease) stage 3, GFR 30-59 ml/min   . Dysrhythmia     A-fib  . Anxiety   . Depression   . Shortness of breath     on exertion  . GERD (gastroesophageal reflux disease)   . Headache(784.0)   . Arthritis     knees  . Anemia    Past Surgical History  Procedure Laterality Date  . Hemorrhoid surgery    . Esophagogastroduodenoscopy N/A 02/25/2013    Procedure: ESOPHAGOGASTRODUODENOSCOPY (EGD);  Surgeon: Theda Belfast, MD;   Location: [facility];  Service: Endoscopy;  Laterality: N/A;  . Colonoscopy N/A 02/25/2013    Procedure: COLONOSCOPY;  Surgeon: Theda Belfast, MD;  Location: [facility];  Service: Endoscopy;  Laterality: N/A;  . Givens capsule study N/A 02/26/2013    Procedure: GIVENS CAPSULE STUDY;  Surgeon: Theda Belfast, MD;  Location: [facility];  Service: Endoscopy;  Laterality: N/A;   Family History  Problem Relation Age of Onset  . Kidney disease Mother   . Heart attack Mother   . Kidney disease Father   . Hypertension Mother   . Breast cancer Daughter   . Heart disease Brother    Social History  Substance Use Topics  . Smoking status: Current Every Day Smoker -- 0.25 packs/day for 45 years    Types: Cigarettes  . Smokeless tobacco: Current User  . Alcohol Use: No   OB History    No data available     Review of Systems  All other systems reviewed and are negative.     Allergies  Levaquin and Penicillins  Home Medications   Prior to Admission medications   Medication Sig Start Date End Date Taking? Authorizing Provider  acetaminophen (TYLENOL) 500 MG tablet Take 1,000 mg by mouth daily as needed for mild pain (  tooth pain).     Historical Provider, MD  albuterol (PROVENTIL HFA;VENTOLIN HFA) 108 (90 BASE) MCG/ACT inhaler Inhale 1-2 puffs into the lungs every 6 (six) hours as needed for wheezing or shortness of breath.    Historical Provider, MD  amiodarone (PACERONE) 200 MG tablet Take 200 mg by mouth daily.    Historical Provider, MD  amLODipine (NORVASC) 5 MG tablet Take 5 mg by mouth daily.    Historical Provider, MD  atorvastatin (LIPITOR) 40 MG tablet Take 40 mg by mouth daily.    Historical Provider, MD  azithromycin (ZITHROMAX) 250 MG tablet Take 1 tablet (250 mg total) by mouth daily. 10/08/15   Dorothea Ogle, MD  Calcium Carb-Cholecalciferol (CALCIUM + D3 PO) Take 1 tablet by mouth 2 (two) times daily.     Historical Provider, MD  docusate sodium (COLACE) 100 MG capsule  Take 100 mg by mouth daily as needed for mild constipation (constipation).     Historical Provider, MD  furosemide (LASIX) 40 MG tablet Take 20 mg by mouth daily.    Historical Provider, MD  guaiFENesin (MUCINEX) 600 MG 12 hr tablet Take 1 tablet (600 mg total) by mouth 2 (two) times daily. 10/08/15   Dorothea Ogle, MD  HYDROcodone-acetaminophen (NORCO/VICODIN) 5-325 MG per tablet Take 1 tablet by mouth every 4 (four) hours as needed for moderate pain (pain).    Historical Provider, MD  levalbuterol Pauline Aus) 0.63 MG/3ML nebulizer solution Take 3 mLs (0.63 mg total) by nebulization every 6 (six) hours as needed for wheezing or shortness of breath. 10/08/15   Dorothea Ogle, MD  montelukast (SINGULAIR) 10 MG tablet Take 10 mg by mouth daily.    Historical Provider, MD  pantoprazole (PROTONIX) 40 MG tablet Take 40 mg by mouth daily.     Historical Provider, MD  potassium chloride SA (K-DUR,KLOR-CON) 20 MEQ tablet Take 1 tablet (20 mEq total) by mouth 2 (two) times daily. 05/26/12   Richarda Overlie, MD  predniSONE (DELTASONE) 10 MG tablet 4 tablets 4 days, 3 tablets 4 days, 2 tablets 4 days, 1 tablet 4 days, half tablet 4 days then discontinue 08/12/15   Richarda Overlie, MD  shark liver oil-cocoa butter (PREPARATION H) 0.25-3-85.5 % suppository Place 1 suppository rectally as needed for hemorrhoids.    Historical Provider, MD  temazepam (RESTORIL) 15 MG capsule Take 15 mg by mouth at bedtime as needed for sleep.  09/11/15   Historical Provider, MD  vitamin B-12 (CYANOCOBALAMIN) 1000 MCG tablet Take 1,000 mcg by mouth 2 (two) times daily.     Historical Provider, MD   BP 139/75 mmHg  Pulse 49  Temp(Src) 98.2 F (36.8 C)  Resp 17  SpO2 94% Physical Exam  Constitutional: She is oriented to person, place, and time. No distress.  HENT:  Right Ear: External ear normal.  Left Ear: External ear normal.  Nose: Nose normal.  Mouth/Throat: Oropharynx is clear and moist. No oropharyngeal exudate.  Eyes:  Conjunctivae and EOM are normal. Pupils are equal, round, and reactive to light.  Neck: Normal range of motion. Neck supple.  Cardiovascular: Normal rate, regular rhythm, normal heart sounds and intact distal pulses.   Pulmonary/Chest: Effort normal. No respiratory distress.  Bilateral coarse breath sounds. Few soft expiratory wheezes in bilateral lower lung bases.   Abdominal: Soft. Bowel sounds are normal. She exhibits no distension. There is no tenderness. There is no rebound and no guarding.  Musculoskeletal: Normal range of motion. She exhibits no edema or tenderness.  Neurological: She is alert and oriented to person, place, and time. No cranial nerve deficit.  Skin: Skin is warm and dry. She is not diaphoretic.  Psychiatric: She has a normal mood and affect.  Nursing note and vitals reviewed.   ED Course  Procedures (including critical care time) Labs Review Labs Reviewed  BASIC METABOLIC PANEL - Abnormal; Notable for the following:    BUN 21 (*)    Creatinine, Ser 1.42 (*)    GFR calc non Af Amer 38 (*)    GFR calc Af Amer 44 (*)    All other components within normal limits  CBC - Abnormal; Notable for the following:    WBC 10.8 (*)    RBC 5.44 (*)    MCV 75.6 (*)    MCH 23.5 (*)    RDW 21.6 (*)    All other components within normal limits  BRAIN NATRIURETIC PEPTIDE  D-DIMER, QUANTITATIVE (NOT AT [facility])  I-STAT TROPOININ, ED    Imaging Review Dg Chest 2 View  10/24/2015  CLINICAL DATA:  Shortness of Breath and chest pain EXAM: CHEST  2 VIEW COMPARISON:  Chest radiograph October 07, 2015 and chest CT October 07, 2015 FINDINGS: There is stable fibrotic change in the bases, more prominent in the right base than left base. There is no frank edema or consolidation. Heart is upper normal in size with pulmonary vascularity within normal limits. No adenopathy. No bone lesions. IMPRESSION: Bibasilar fibrotic change, slightly more on the right than on the left. No frank edema or  consolidation. No change in cardiac silhouette. Electronically Signed   By: Bretta Bang III M.D.   On: 10/24/2015 15:46   I have personally reviewed and evaluated these images and lab results as part of my medical decision-making.   EKG Interpretation   Date/Time:  Wednesday October 24 2015 15:27:25 EST Ventricular Rate:  70 PR Interval:  164 QRS Duration: 89 QT Interval:  425 QTC Calculation: 459 R Axis:   47 Text Interpretation:  Sinus rhythm Left atrial enlargement Confirmed by  [facility] MD, Barbara Cower [phone]) on 10/24/2015 4:13:34 PM      MDM   Final diagnoses:  Hypoxia  Chest pain, rule out acute myocardial infarction   Pt with multiple co-morbidities who presents with chest pain and SOB. Ddx includes ACS, CHF exacerbation, COPD exacerbation, PE, pneumonia. Will check cbc, bmp, bnp, troponin, CXR, and EKG. Wells 0 so will get d-dimer to r/o PE. HEART score 4.   EKG initially showed possible junctional rhythm, repeat is NSR. CXR shows bibasilar fibrotic change but no edema or consolidation. CBC shows white count of 10.8 which is improving from prior. BMP close to pt's baseline, Cr slightly up at 1.42. On re-eval she continues to be hypoxic to 88-89% on RA. She is not working hard to breathe. No stridor, retractions, or accessory muscle usage. There continue to be few soft expiratory wheezes after albuterol tx. D-dimer still pending. Dr. Clayborne Dana (attending) to talk with cardiology regarding pt's abnormal EKG. Anticipate admit to hospital medicine given pt's continued hypoxia.   D-dimer negative. Spoke to cardiology regarding pt's EKG, they think it is likely sinus brady with a competing junctional rhythm. Nothing to do now. They recommend observation by hospitalists and consult cards if anything changes. Spoke to Dr. Robb Matar of hospital medicine--will admit for chest pain r/o and hypoxia.    Carlene Coria, PA-C 10/24/15 1834   After discussion with Dr. Robb Matar pt stated she did not  want  to come into the hospital. Spoke extensively with pt and explained the risks of going home now including deterioration of condition and even death. Pt verbalized understanding and wants to sign out AMA.   Carlene Coria, PA-C 10/24/15 1926  Marily Memos, MD 10/27/15 [phone]

## 2015-10-24 NOTE — ED Notes (Signed)
Pt told PA she wanted to leave AMA. IV removed.

## 2015-10-24 NOTE — ED Notes (Signed)
Patient c/o chest tightness and SOB since last night.  Pain in left chest only does not radiate.  Denies nausea and vomiting but, has been sweating which is unusual.  Breathing even and unlabored.

## 2015-10-24 NOTE — ED Notes (Signed)
md made aware of oxygen desaturation

## 2015-10-24 NOTE — ED Notes (Signed)
Patient ambulatory to the room, desat to 83%, applied 2L o2 nasal canula now reading 100%.

## 2015-11-06 ENCOUNTER — Institutional Professional Consult (permissible substitution): Payer: Medicare Other | Admitting: Internal Medicine

## 2015-12-06 ENCOUNTER — Institutional Professional Consult (permissible substitution): Payer: Medicare Other | Admitting: Internal Medicine

## 2015-12-13 ENCOUNTER — Encounter: Payer: Self-pay | Admitting: Internal Medicine

## 2015-12-13 ENCOUNTER — Ambulatory Visit (INDEPENDENT_AMBULATORY_CARE_PROVIDER_SITE_OTHER): Payer: Medicare Other | Admitting: Internal Medicine

## 2015-12-13 ENCOUNTER — Ambulatory Visit (INDEPENDENT_AMBULATORY_CARE_PROVIDER_SITE_OTHER)
Admission: RE | Admit: 2015-12-13 | Discharge: 2015-12-13 | Disposition: A | Discharge status: Home / Self Care | Payer: Medicare Other | Source: Ambulatory Visit | Attending: Internal Medicine | Admitting: Internal Medicine

## 2015-12-13 VITALS — BP 106/60 | HR 54 | Ht 66.0 in | Wt 208.0 lb

## 2015-12-13 DIAGNOSIS — F17210 Nicotine dependence, cigarettes, uncomplicated: Secondary | ICD-10-CM

## 2015-12-13 DIAGNOSIS — J479 Bronchiectasis, uncomplicated: Secondary | ICD-10-CM

## 2015-12-13 DIAGNOSIS — J8410 Pulmonary fibrosis, unspecified: Secondary | ICD-10-CM | POA: Diagnosis not present

## 2015-12-13 DIAGNOSIS — R0600 Dyspnea, unspecified: Secondary | ICD-10-CM | POA: Diagnosis not present

## 2015-12-13 NOTE — Assessment & Plan Note (Signed)

## 2015-12-13 NOTE — Patient Instructions (Addendum)
Please remember to go to the x-ray department downstairs for your tests - we will call you with the results when they are available.  The key is to stop smoking completely before smoking completely stops you!   Please schedule a follow up office visit in 4 weeks, sooner if needed with pfts on return (at [facility] if not here)   late add :  Needs to return to complete the w/u next avail and do the pfts at [facility] before the ov

## 2015-12-13 NOTE — Progress Notes (Signed)
Subjective:    Patient ID: [name], female    DOB: 1950/12/20,     MRN: [id number]  HPI  36 yobf active smoker with new onset doe x winter of 2016 while on amiodarone per [place] referred by Dr Vista Lawman to pulmonary clinic 12/13/2015 sp admit:  Admit date: 10/07/2015 Discharge date: 10/08/2015  Recommendations for Outpatient Follow-up:  1. Pt will need to follow up with PCP in 2-3 weeks post discharge 2. Please obtain BMP to evaluate electrolytes and kidney function 3. Please also check CBC to evaluate Hg and Hct levels 4. Stop Losartan until renal function stabilizes  5. Continue Zithromax to complete therapy 6. Continue prednisone taper  Discharge Diagnoses:  Principal Problem:  Acute respiratory failure (HCC) Active Problems:  Acute on chronic diastolic CHF (congestive heart failure) (HCC)  Schizophrenia (HCC)  HCAP (healthcare-associated pneumonia)  TOBACCO USER  Atrial fibrillation (HCC)  Leukocytosis  CKD (chronic kidney disease) stage 3, GFR 30-59 ml/min  Essential hypertension  Discharge Condition: Stable  Diet recommendation: Heart healthy diet discussed in details   HPI: Pt is 65 yo female who presented to [facility] ED with main concern of 1-2 days duration of progressively worsening dyspnea associate with mix episodes of non productive and productive cough of clear sputum, associated with left sided chest discomfort that is present with coughing spells, subjective fever, chills. Pt reports she has just came back from [place] yesterday. She was recently hospitalized for PNA in September this year. Pt denies abd or urinary concerns.   In ED, pt noted to be hemodynamically stable, VSS, blood work notable for WBC 16 K, imaging studies worrisome for ? Developing PNA. TRH asked to admit for further evaluation.   Assessment and Plan:  Principal Problem:  Acute respiratory failure ([place]) - secondary to acute on chronic diastolic CHF and HCAP -  Lasix upon discharge, ABX for pneumonia - also continue Prednisone taper   Active Problems:  Acute on chronic diastolic CHF (congestive heart failure) ([place]) - last 2 D ECHO 11/6943 wit diastolic grade I CHF and normal EF 60% - continue with Lasix  - weight 93 kg on admission --> 91 kg    Schizophrenia ([place]) - was agitated on ED initially but now more stable and calm   HCAP (healthcare-associated pneumonia) - pneumonia order set in place - follow up on sputum culture, urine legionella and strep penumo - continue ABX    Hypokalemia - supplemented prior discharges    TOBACCO USER - consultation cessation provided    Atrial fibrillation ([place]), CHADS 2 score ~3 - on amiodarone at home, not on [facility] and pt unable to tell why  - pt currently in sinus rhythm    Leukocytosis - secondary to HCAP - ABX as noted above    CKD (chronic kidney disease) stage 3, GFR 30-59 ml/min - at baseline Cr 1.3 - 1.5   Essential hypertension - reasonably stable so far   12/13/2015 1st [facility] office visit/ Wert   Chief Complaint  Patient presents with  . Pulmonary Consult    Referred by Dr. Vista Lawman.  Pt c/o DOE for the past yr. She gets SOB walking up and down stairs and approx 1 block on a flat surface. She also c/o cough with clear sputum.    cough comes and goes s pattern day and some worse at hs, sob x one flight or one block nl pace, onset was insidious/ pattern is slowly progressive - ? Transiently better  with pred/ neb alb   No obvious day to day or daytime variability or assoc excess/ purulent sputum or mucus plugs or hemoptysis or cp or chest tightness, subjective wheeze or overt sinus or hb symptoms. No unusual exp hx or h/o childhood pna/ asthma or knowledge of premature birth.  Sleeping ok without nocturnal  or early am exacerbation  of respiratory  c/o's or need for noct saba. Also denies any obvious fluctuation of symptoms with weather or environmental changes or  other aggravating or alleviating factors except as outlined above   Current Medications, Allergies, Complete Past Medical History, Past Surgical History, Family History, and Social History were reviewed in [employer] record.      Review of Systems  Constitutional: Negative for fever, chills and unexpected weight change.  HENT: Negative for congestion, dental problem, ear pain, nosebleeds, postnasal drip, rhinorrhea, sinus pressure, sneezing, sore throat, trouble swallowing and voice change.   Eyes: Negative for visual disturbance.  Respiratory: Positive for cough and shortness of breath. Negative for choking.   Cardiovascular: Positive for leg swelling. Negative for chest pain.  Gastrointestinal: Negative for vomiting, abdominal pain and diarrhea.  Genitourinary: Negative for difficulty urinating.  Musculoskeletal: Positive for arthralgias.  Skin: Negative for rash.  Neurological: Positive for headaches. Negative for tremors and syncope.  Hematological: Does not bruise/bleed easily.       Objective:   Physical Exam  amb bf nad / smells of cigs and ? Kerosene?   Wt Readings from Last 3 Encounters:  12/13/15 208 lb (94.348 kg)  10/08/15 200 lb 14.4 oz (91.128 kg)  08/08/15 197 lb 1.5 oz (89.4 kg)    Vital signs reviewed   HEENT: nl dentition, turbinates, and oropharynx. Nl external ear canals without cough reflex   NECK :  without JVD/Nodes/TM/ nl carotid upstrokes bilaterally   LUNGS: no acc muscle use,  Nl contour chest a few insp and exp rhonchi upper > lower s crackles    CV:  RRR  no s3 or murmur or increase in P2, trace bilateral lower ext  Pitting  edema   ABD:  soft and nontender with nl inspiratory excursion in the supine position. No bruits or organomegaly, bowel sounds nl  MS:  Nl gait/ ext warm without deformities, calf tenderness, cyanosis or clubbing No obvious joint restrictions   SKIN: warm and dry without lesions    NEURO:   alert, approp, nl sensorium with  no motor deficits     CXR PA and Lateral:   12/13/2015 :    I personally reviewed images and agree with radiology impression as follows:   Interstitial lung disease with basilar predominant fine interstitial opacities and changes of bronchiectasis. No signs of honeycombing. No superimposed pneumonia or edema. No effusion or pneumothorax. Chronic prominence of the main pulmonary artery, likely pulmonary hypertension.    BNP12/2/16 =  94        Assessment & Plan:

## 2015-12-14 ENCOUNTER — Telehealth: Payer: Self-pay | Admitting: Internal Medicine

## 2015-12-14 DIAGNOSIS — R0609 Other forms of dyspnea: Principal | ICD-10-CM

## 2015-12-14 DIAGNOSIS — R0600 Dyspnea, unspecified: Secondary | ICD-10-CM

## 2015-12-14 NOTE — Telephone Encounter (Signed)
Spoke with pt. States that she wants to switch Cardiologists. She currently sees Dr. Jacinto Halim. Wants to know if MW will refer her [facility].  MW - please advise. Thanks.

## 2015-12-14 NOTE — Telephone Encounter (Signed)
Pt is aware of MW's recommendation. Orders have been placed. Nothing further was needed.

## 2015-12-14 NOTE — Telephone Encounter (Signed)
Spoke with pt. States that when she was here yesterday, MW wanted her to have blood work done with her cardiologist. She does not see Dr. Jacinto Halim until 01/17/2016. Wants to know if MW is okay with her waiting until then.  MW - please advise. Thanks.

## 2015-12-14 NOTE — Telephone Encounter (Signed)
My understanding was it was for 12/17/15 so that's too long Needs bnp, tsh, esr, cbc with diff and bmet under dx of dyspnea done here and I will send them to [facility] so he'll have them at [facility] 2/23

## 2015-12-14 NOTE — Progress Notes (Signed)
Quick Note:  Spoke with pt and notified of results per Dr. Wert. Pt verbalized understanding and denied any questions.  ______ 

## 2015-12-15 NOTE — Telephone Encounter (Signed)
Fine with me if they will accept her

## 2015-12-16 DIAGNOSIS — J479 Bronchiectasis, uncomplicated: Secondary | ICD-10-CM | POA: Insufficient documentation

## 2015-12-16 DIAGNOSIS — J8410 Pulmonary fibrosis, unspecified: Secondary | ICD-10-CM | POA: Insufficient documentation

## 2015-12-16 NOTE — Assessment & Plan Note (Addendum)
Newly seen on cxr 08/07/2015  - Amiodarone started 11/28/13 by Dr Nadara Eaton for [facility] seen on cxr 08/07/2015 ? When amiodarone started   Patients typically have been on amiodarone for 6-12 months before this complication manifests.  Of note, serial clinical evaluation for symptoms such as cough dyspnea or fevers is  the preferred method of monitoring for pulmonary toxicity because a decrease in DLCO or lung volumes is a nonspecific for toxicity. Pathologically amiodarone pulmonary toxicity may appear as interstitial pneumonitis, eosinophilic pneumonia, organizing pneumonia, pulmonary fibrosis or less commonly as diffuse alveolar hemorrhage, pulmonary nodules or pleural effusions.  Risk factors for pulmonary toxicity include age greater than 60, daily dose greater than equal to 400 mg, a high cumulative dose, or pre-existing lung disease    DDx for pulmonary fibrosis  includes idiopathic pulmonary fibrosis, pulmonary fibrosis associated with rheumatologic diseases (which have a relatively benign course in most cases) , adverse effect from  drugs such as chemotherapy or amiodarone exposure, nonspecific interstitial pneumonia which is typically steroid responsive, and chronic hypersensitivity pneumonitis.   In active  smokers Langerhan's Cell  Histiocyctosis (eosinophilic granuomatosis),  DIP,  and Respiratory Bronchiolitis ILD also need to be considered esp in this active smoker   Will get her back asap to complete the w/u/ consider trial off amiodarone

## 2015-12-16 NOTE — Assessment & Plan Note (Signed)
CT chest 10/07/15 Mild chronic emphysematous changes. Changes of bronchiectasis and bronchial thickening consistent with some mild bronchitis. No focal confluent infiltrate is seen.  pfts next step

## 2015-12-16 NOTE — Assessment & Plan Note (Addendum)
-   12/13/2015  Walked RA x 2 laps @ 185 ft each stopped due to sob with sats down to 83% mod pace   Pt did not go to lab but I strongly suspect low grade amiodarone toxicity here as the changes on plain cxr are new in 2016 > referred back to her cardiologist  but wants another opinion so referred to [facility] > in meantime will ask her to return asap to complete the w/u with pfts on returns as well as labs  Total time devoted to counseling  = 35/69m review case with pt/ discussion of options/alternatives/ giving and going over instructions (see avs)

## 2015-12-17 NOTE — Telephone Encounter (Signed)
Referral has been placed per MW. Pt is aware. Nothing further was needed.

## 2015-12-19 ENCOUNTER — Telehealth: Payer: Self-pay | Admitting: Internal Medicine

## 2015-12-19 ENCOUNTER — Other Ambulatory Visit (INDEPENDENT_AMBULATORY_CARE_PROVIDER_SITE_OTHER): Payer: Medicare Other

## 2015-12-19 DIAGNOSIS — R0600 Dyspnea, unspecified: Secondary | ICD-10-CM | POA: Diagnosis not present

## 2015-12-19 LAB — CBC WITH DIFFERENTIAL/PLATELET
BASOS ABS: 0 10*3/uL (ref 0.0–0.1)
Basophils Relative: 0.3 % (ref 0.0–3.0)
EOS ABS: 0.1 10*3/uL (ref 0.0–0.7)
Eosinophils Relative: 0.8 % (ref 0.0–5.0)
HCT: 43.2 % (ref 36.0–46.0)
Hemoglobin: 13.5 g/dL (ref 12.0–15.0)
LYMPHS ABS: 1.7 10*3/uL (ref 0.7–4.0)
Lymphocytes Relative: 13.8 % (ref 12.0–46.0)
MCHC: 31.2 g/dL (ref 30.0–36.0)
MCV: 74.1 fl — AB (ref 78.0–100.0)
MONO ABS: 0.7 10*3/uL (ref 0.1–1.0)
MONOS PCT: 5.9 % (ref 3.0–12.0)
NEUTROS ABS: 9.7 10*3/uL — AB (ref 1.4–7.7)
NEUTROS PCT: 79.2 % — AB (ref 43.0–77.0)
PLATELETS: 474 10*3/uL — AB (ref 150.0–400.0)
RBC: 5.82 Mil/uL — AB (ref 3.87–5.11)
RDW: 18.5 % — ABNORMAL HIGH (ref 11.5–15.5)
WBC: 12.2 10*3/uL — ABNORMAL HIGH (ref 4.0–10.5)

## 2015-12-19 LAB — BASIC METABOLIC PANEL
BUN: 18 mg/dL (ref 6–23)
CHLORIDE: 103 meq/L (ref 96–112)
CO2: 25 meq/L (ref 19–32)
Calcium: 9.9 mg/dL (ref 8.4–10.5)
Creatinine, Ser: 1.46 mg/dL — ABNORMAL HIGH (ref 0.40–1.20)
GFR: 46.17 mL/min — ABNORMAL LOW (ref >60.00)
Glucose, Bld: 92 mg/dL (ref 70–99)
POTASSIUM: 4 meq/L (ref 3.5–5.1)
SODIUM: 137 meq/L (ref 135–145)

## 2015-12-19 LAB — TSH: TSH: 1.57 u[IU]/mL (ref 0.35–4.50)

## 2015-12-19 LAB — BRAIN NATRIURETIC PEPTIDE: PRO B NATRI PEPTIDE: 76 pg/mL (ref 0.0–100.0)

## 2015-12-19 LAB — SEDIMENTATION RATE: SED RATE: 33 mm/h — AB (ref 0–22)

## 2015-12-19 NOTE — Telephone Encounter (Signed)
I spoke with [name] @[facility].  She confirmed that someone at [facility] had LVM for pt to call back to schedule the Referral appt placed by MW.  I called pt and advised that [facility] had tried to reach her to schedule the Referral appt.  I provided patient with the phone number for [facility] so she could get her appt set up.

## 2015-12-19 NOTE — Telephone Encounter (Signed)
Called and spoke to pt. Pt states she got a VM but is unsure of who called. Advised pt I do not see anywhere in the system where we called. Pt questioning when her cardiology appt is that MW ordered. Pt aware she will receive a call to schedule cards appt.   PCC's please advise. Thanks.

## 2015-12-20 NOTE — Progress Notes (Signed)
Quick Note:  Spoke with pt and notified of results per Dr. Wert. Pt verbalized understanding and denied any questions.  ______ 

## 2016-01-03 ENCOUNTER — Emergency Department (HOSPITAL_COMMUNITY): Payer: Medicare Other

## 2016-01-03 ENCOUNTER — Inpatient Hospital Stay (HOSPITAL_COMMUNITY)
Admission: EM | Admit: 2016-01-03 | Discharge: 2016-01-06 | DRG: 291 | Disposition: A | Discharge status: Home / Self Care | Payer: Medicare Other | Attending: Internal Medicine | Admitting: Internal Medicine

## 2016-01-03 DIAGNOSIS — J9621 Acute and chronic respiratory failure with hypoxia: Secondary | ICD-10-CM | POA: Diagnosis present

## 2016-01-03 DIAGNOSIS — D72829 Elevated white blood cell count, unspecified: Secondary | ICD-10-CM | POA: Diagnosis present

## 2016-01-03 DIAGNOSIS — I480 Paroxysmal atrial fibrillation: Secondary | ICD-10-CM | POA: Diagnosis not present

## 2016-01-03 DIAGNOSIS — F17210 Nicotine dependence, cigarettes, uncomplicated: Secondary | ICD-10-CM | POA: Diagnosis present

## 2016-01-03 DIAGNOSIS — N179 Acute kidney failure, unspecified: Secondary | ICD-10-CM | POA: Diagnosis present

## 2016-01-03 DIAGNOSIS — I5033 Acute on chronic diastolic (congestive) heart failure: Principal | ICD-10-CM | POA: Diagnosis present

## 2016-01-03 DIAGNOSIS — T380X5A Adverse effect of glucocorticoids and synthetic analogues, initial encounter: Secondary | ICD-10-CM | POA: Diagnosis present

## 2016-01-03 DIAGNOSIS — J984 Other disorders of lung: Secondary | ICD-10-CM

## 2016-01-03 DIAGNOSIS — R0989 Other specified symptoms and signs involving the circulatory and respiratory systems: Secondary | ICD-10-CM

## 2016-01-03 DIAGNOSIS — M170 Bilateral primary osteoarthritis of knee: Secondary | ICD-10-CM | POA: Diagnosis not present

## 2016-01-03 DIAGNOSIS — R0602 Shortness of breath: Secondary | ICD-10-CM

## 2016-01-03 DIAGNOSIS — I1310 Hypertensive heart and chronic kidney disease without heart failure, with stage 1 through stage 4 chronic kidney disease, or unspecified chronic kidney disease: Secondary | ICD-10-CM | POA: Diagnosis present

## 2016-01-03 DIAGNOSIS — I10 Essential (primary) hypertension: Secondary | ICD-10-CM | POA: Diagnosis not present

## 2016-01-03 DIAGNOSIS — I129 Hypertensive chronic kidney disease with stage 1 through stage 4 chronic kidney disease, or unspecified chronic kidney disease: Secondary | ICD-10-CM | POA: Diagnosis present

## 2016-01-03 DIAGNOSIS — Z720 Tobacco use: Secondary | ICD-10-CM | POA: Diagnosis not present

## 2016-01-03 DIAGNOSIS — T462X5A Adverse effect of other antidysrhythmic drugs, initial encounter: Secondary | ICD-10-CM | POA: Diagnosis present

## 2016-01-03 DIAGNOSIS — N183 Chronic kidney disease, stage 3 (moderate): Secondary | ICD-10-CM | POA: Diagnosis present

## 2016-01-03 DIAGNOSIS — J441 Chronic obstructive pulmonary disease with (acute) exacerbation: Secondary | ICD-10-CM | POA: Diagnosis present

## 2016-01-03 DIAGNOSIS — M25561 Pain in right knee: Secondary | ICD-10-CM

## 2016-01-03 LAB — CBC
HCT: 38.5 % (ref 36.0–46.0)
HEMOGLOBIN: 11.8 g/dL — AB (ref 12.0–15.0)
MCH: 22.3 pg — ABNORMAL LOW (ref 26.0–34.0)
MCHC: 30.6 g/dL (ref 30.0–36.0)
MCV: 72.6 fL — ABNORMAL LOW (ref 78.0–100.0)
Platelets: 438 10*3/uL — ABNORMAL HIGH (ref 150–400)
RBC: 5.3 MIL/uL — AB (ref 3.87–5.11)
RDW: 17.3 % — ABNORMAL HIGH (ref 11.5–15.5)
WBC: 11.5 10*3/uL — AB (ref 4.0–10.5)

## 2016-01-03 LAB — BASIC METABOLIC PANEL
Anion gap: 11 (ref 5–15)
BUN: 24 mg/dL — ABNORMAL HIGH (ref 6–20)
CALCIUM: 10 mg/dL (ref 8.9–10.3)
CO2: 21 mmol/L — ABNORMAL LOW (ref 22–32)
CREATININE: 1.76 mg/dL — AB (ref 0.44–1.00)
Chloride: 108 mmol/L (ref 101–111)
GFR calc non Af Amer: 29 mL/min — ABNORMAL LOW (ref >60)
GFR, EST AFRICAN AMERICAN: 34 mL/min — AB (ref >60)
Glucose, Bld: 100 mg/dL — ABNORMAL HIGH (ref 65–99)
Potassium: 4.3 mmol/L (ref 3.5–5.1)
SODIUM: 140 mmol/L (ref 135–145)

## 2016-01-03 LAB — BRAIN NATRIURETIC PEPTIDE: B Natriuretic Peptide: 156 pg/mL — ABNORMAL HIGH (ref 0.0–100.0)

## 2016-01-03 LAB — I-STAT TROPONIN, ED: TROPONIN I, POC: 0 ng/mL (ref 0.00–0.08)

## 2016-01-03 MED ORDER — CALCIUM CARBONATE-VITAMIN D 500-200 MG-UNIT PO TABS
1.0000 | ORAL_TABLET | Freq: Every day | ORAL | Status: DC
  Administered 2016-01-04 – 2016-01-06 (×3): 1 via ORAL
  Filled 2016-01-03 (×6): qty 1

## 2016-01-03 MED ORDER — ENOXAPARIN SODIUM 40 MG/0.4ML ~~LOC~~ SOLN
40.0000 mg | Freq: Every day | SUBCUTANEOUS | Status: DC
Start: 2016-01-03 — End: 2016-01-06
  Administered 2016-01-04 – 2016-01-05 (×3): 40 mg via SUBCUTANEOUS
  Filled 2016-01-03 (×3): qty 0.4

## 2016-01-03 MED ORDER — IPRATROPIUM BROMIDE 0.02 % IN SOLN
0.5000 mg | Freq: Four times a day (QID) | RESPIRATORY_TRACT | Status: DC
Start: 2016-01-04 — End: 2016-01-03

## 2016-01-03 MED ORDER — IPRATROPIUM-ALBUTEROL 0.5-2.5 (3) MG/3ML IN SOLN
3.0000 mL | Freq: Once | RESPIRATORY_TRACT | Status: AC
  Administered 2016-01-03: 3 mL via RESPIRATORY_TRACT
  Filled 2016-01-03: qty 3

## 2016-01-03 MED ORDER — ONDANSETRON HCL 4 MG PO TABS: 4.0000 mg | ORAL_TABLET | Freq: Four times a day (QID) | ORAL | Status: DC | PRN

## 2016-01-03 MED ORDER — PRAVASTATIN SODIUM 20 MG PO TABS
20.0000 mg | ORAL_TABLET | Freq: Every day | ORAL | Status: DC
  Administered 2016-01-04 – 2016-01-05 (×2): 20 mg via ORAL
  Filled 2016-01-03 (×2): qty 1

## 2016-01-03 MED ORDER — MORPHINE SULFATE (PF) 2 MG/ML IV SOLN: 2.0000 mg | Freq: Once | INTRAVENOUS | Status: DC

## 2016-01-03 MED ORDER — OXYCODONE-ACETAMINOPHEN 5-325 MG PO TABS
1.0000 | ORAL_TABLET | Freq: Four times a day (QID) | ORAL | Status: DC
  Filled 2016-01-03: qty 1

## 2016-01-03 MED ORDER — OXYCODONE HCL 5 MG PO TABS
2.5000 mg | ORAL_TABLET | Freq: Four times a day (QID) | ORAL | Status: DC
  Filled 2016-01-03: qty 1

## 2016-01-03 MED ORDER — FUROSEMIDE 10 MG/ML IJ SOLN: 20.0000 mg | Freq: Two times a day (BID) | INTRAMUSCULAR | Status: DC

## 2016-01-03 MED ORDER — POTASSIUM CHLORIDE CRYS ER 20 MEQ PO TBCR
20.0000 meq | EXTENDED_RELEASE_TABLET | Freq: Two times a day (BID) | ORAL | Status: DC
  Administered 2016-01-04 – 2016-01-06 (×5): 20 meq via ORAL
  Filled 2016-01-03 (×6): qty 1

## 2016-01-03 MED ORDER — VITAMIN B-12 1000 MCG PO TABS
2000.0000 ug | ORAL_TABLET | Freq: Every day | ORAL | Status: DC
  Administered 2016-01-04 – 2016-01-06 (×3): 2000 ug via ORAL
  Filled 2016-01-03 (×4): qty 2

## 2016-01-03 MED ORDER — LEVALBUTEROL HCL 0.63 MG/3ML IN NEBU
0.6300 mg | INHALATION_SOLUTION | Freq: Two times a day (BID) | RESPIRATORY_TRACT | Status: DC
  Administered 2016-01-04 – 2016-01-05 (×3): 0.63 mg via RESPIRATORY_TRACT
  Filled 2016-01-03 (×3): qty 3

## 2016-01-03 MED ORDER — IPRATROPIUM BROMIDE 0.02 % IN SOLN
0.5000 mg | Freq: Two times a day (BID) | RESPIRATORY_TRACT | Status: DC
  Administered 2016-01-04: 0.5 mg via RESPIRATORY_TRACT
  Filled 2016-01-03: qty 2.5

## 2016-01-03 MED ORDER — MONTELUKAST SODIUM 10 MG PO TABS
10.0000 mg | ORAL_TABLET | Freq: Every evening | ORAL | Status: DC
  Administered 2016-01-04 – 2016-01-05 (×2): 10 mg via ORAL
  Filled 2016-01-03 (×2): qty 1

## 2016-01-03 MED ORDER — LEVALBUTEROL HCL 0.63 MG/3ML IN NEBU: 0.6300 mg | INHALATION_SOLUTION | Freq: Four times a day (QID) | RESPIRATORY_TRACT | Status: DC

## 2016-01-03 MED ORDER — ARFORMOTEROL TARTRATE 15 MCG/2ML IN NEBU
15.0000 ug | INHALATION_SOLUTION | Freq: Two times a day (BID) | RESPIRATORY_TRACT | Status: DC
Start: 2016-01-03 — End: 2016-01-04
  Administered 2016-01-03 – 2016-01-04 (×2): 15 ug via RESPIRATORY_TRACT
  Filled 2016-01-03 (×2): qty 2

## 2016-01-03 MED ORDER — SODIUM CHLORIDE 0.9% FLUSH
3.0000 mL | Freq: Two times a day (BID) | INTRAVENOUS | Status: DC
Start: 2016-01-03 — End: 2016-01-06
  Administered 2016-01-04 – 2016-01-05 (×3): 3 mL via INTRAVENOUS

## 2016-01-03 MED ORDER — ONDANSETRON HCL 4 MG/2ML IJ SOLN: 4.0000 mg | Freq: Four times a day (QID) | INTRAMUSCULAR | Status: DC | PRN

## 2016-01-03 MED ORDER — DEXTROSE 5 % IV SOLN
1.0000 g | Freq: Every day | INTRAVENOUS | Status: DC
  Administered 2016-01-04: 1 g via INTRAVENOUS
  Filled 2016-01-03 (×2): qty 10

## 2016-01-03 MED ORDER — FUROSEMIDE 10 MG/ML IJ SOLN
40.0000 mg | Freq: Once | INTRAMUSCULAR | Status: AC
  Administered 2016-01-03: 40 mg via INTRAVENOUS
  Filled 2016-01-03: qty 4

## 2016-01-03 MED ORDER — BUDESONIDE 0.5 MG/2ML IN SUSP
0.5000 mg | Freq: Two times a day (BID) | RESPIRATORY_TRACT | Status: DC
  Administered 2016-01-03 – 2016-01-04 (×2): 0.5 mg via RESPIRATORY_TRACT
  Filled 2016-01-03 (×2): qty 2

## 2016-01-03 MED ORDER — AMIODARONE HCL 200 MG PO TABS
200.0000 mg | ORAL_TABLET | Freq: Every day | ORAL | Status: DC
  Administered 2016-01-04 – 2016-01-05 (×2): 200 mg via ORAL
  Filled 2016-01-03 (×2): qty 1

## 2016-01-03 MED ORDER — FENTANYL CITRATE (PF) 100 MCG/2ML IJ SOLN
50.0000 ug | Freq: Once | INTRAMUSCULAR | Status: AC
  Administered 2016-01-03: 50 ug via INTRAMUSCULAR
  Filled 2016-01-03: qty 2

## 2016-01-03 MED ORDER — DEXTROSE 5 % IV SOLN: 500.0000 mg | INTRAVENOUS | Status: DC

## 2016-01-03 MED ORDER — GUAIFENESIN ER 600 MG PO TB12
600.0000 mg | ORAL_TABLET | Freq: Two times a day (BID) | ORAL | Status: DC
  Filled 2016-01-03 (×4): qty 1

## 2016-01-03 MED ORDER — OXYCODONE-ACETAMINOPHEN 5-325 MG PO TABS: 1.0000 | ORAL_TABLET | Freq: Once | ORAL | Status: DC

## 2016-01-03 MED ORDER — PREDNISONE 20 MG PO TABS
60.0000 mg | ORAL_TABLET | Freq: Once | ORAL | Status: AC
  Administered 2016-01-03: 60 mg via ORAL
  Filled 2016-01-03: qty 3

## 2016-01-03 MED ORDER — OXYCODONE-ACETAMINOPHEN 7.5-325 MG PO TABS: 1.0000 | ORAL_TABLET | Freq: Four times a day (QID) | ORAL | Status: DC

## 2016-01-03 MED ORDER — PANTOPRAZOLE SODIUM 40 MG PO TBEC
40.0000 mg | DELAYED_RELEASE_TABLET | Freq: Every day | ORAL | Status: DC
  Administered 2016-01-04 – 2016-01-06 (×3): 40 mg via ORAL
  Filled 2016-01-03 (×3): qty 1

## 2016-01-03 MED ORDER — AMLODIPINE BESYLATE 10 MG PO TABS
10.0000 mg | ORAL_TABLET | Freq: Every day | ORAL | Status: DC
  Administered 2016-01-04 – 2016-01-06 (×3): 10 mg via ORAL
  Filled 2016-01-03 (×3): qty 1

## 2016-01-03 MED ORDER — HYDROMORPHONE HCL 1 MG/ML IJ SOLN
0.5000 mg | INTRAMUSCULAR | Status: DC | PRN
Start: 2016-01-03 — End: 2016-01-04
  Administered 2016-01-04: 0.5 mg via INTRAVENOUS
  Filled 2016-01-03: qty 1

## 2016-01-03 NOTE — ED Notes (Signed)
Pt requests delay in duoneb so she can finish phone call finding care for family member

## 2016-01-03 NOTE — ED Notes (Signed)
Pt with Hx of CHF c/o SOB and CP that has been ongoing, has been seeing PCP without being able to find cause of SOB. Pt O2 saturation 85% on room air.

## 2016-01-03 NOTE — ED Provider Notes (Signed)
CSN: [id number]     Arrival date & time 01/03/16  1837 History   First MD Initiated Contact with Patient 01/03/16 2031     Chief Complaint  Patient presents with  . Shortness of Breath    HPI   [name] is an 65 y.o. female with history of paroxysmal afib (no anticoagulation, on amiodarone), HTN, HLD, CHF, CKD, who presents to the ED for evaluation of SOB and DOE. She states SOB and DOE has been an ongoing issue for her for months and she is working with her PCP, pulmonologist, and cardiologist to get to the bottom of it. However, she notes that over the past week she has noticed she has gotten particularly worse. She states that at baseline even over the past few months she has been able to go shopping, walk around the house, etc. And remain asymptomatic. States that now she starts getting short of breath walking down the hall. She states she went to the grocery store yesterday and thought she was going to pass out. She states that today she suddenly felt her chest tighten up earlier this morning. She tried her home albuterol inhaler with no relief. In the ED she is hypoxic on RA to 85%. This improves to mid 90s with 3L O2 via [place]. With ambulation she quickly becomes hypoxic to mid 70s, short of breath, diaphoretic. She quickly recovers with nasal cannula in place. She states this is new for her. She states she does not have supplemental O2 at home. Denies chest pain. States her chest does feel tight. Also reports productive cough for the past week. Denies fever or chills. Denies abd pain, n/v/d.   CT chest 09/2015:  Mild chronic emphysematous changes. Changes of bronchiectasis and bronchial thickening consistent with some mild bronchitis. No focal confluent infiltrate is seen.  Of note, pt has been working with cards and pulm for the past several months for evaluation of her SOB/DOE. There was thought that some of the fibrotic changes could be due to amiodarone which she was started on last year.    Past Medical History  Diagnosis Date  . Hypertension   . Borderline diabetes   . Irregular heart beat   . Hyperlipidemia   . CHF (congestive heart failure) (HCC)   . CKD (chronic kidney disease) stage 3, GFR 30-59 ml/min   . Dysrhythmia     A-fib  . Anxiety   . Depression   . Shortness of breath     on exertion  . GERD (gastroesophageal reflux disease)   . Headache(784.0)   . Arthritis     knees  . Anemia    Past Surgical History  Procedure Laterality Date  . Hemorrhoid surgery    . Esophagogastroduodenoscopy N/A 02/25/2013    Procedure: ESOPHAGOGASTRODUODENOSCOPY (EGD);  Surgeon: Theda Belfast, MD;  Location: [facility];  Service: Endoscopy;  Laterality: N/A;  . Colonoscopy N/A 02/25/2013    Procedure: COLONOSCOPY;  Surgeon: Theda Belfast, MD;  Location: [facility];  Service: Endoscopy;  Laterality: N/A;  . Givens capsule study N/A 02/26/2013    Procedure: GIVENS CAPSULE STUDY;  Surgeon: Theda Belfast, MD;  Location: [facility] END[facility];  Service: Endoscopy;  Laterality: N/A;   Family History  Problem Relation Age of Onset  . Kidney disease Mother   . Heart attack Mother   . Kidney disease Father   . Hypertension Mother   . Breast cancer Daughter   . Heart disease Brother    Social History  Substance Use Topics  . Smoking status: Current Every Day Smoker -- 0.25 packs/day for 45 years    Types: Cigarettes  . Smokeless tobacco: Current User  . Alcohol Use: No   OB History    No data available     Review of Systems  All other systems reviewed and are negative.     Allergies  Levaquin and Penicillins  Home Medications   Prior to Admission medications   Medication Sig Start Date End Date Taking? Authorizing Provider  albuterol (PROVENTIL HFA;VENTOLIN HFA) 108 (90 BASE) MCG/ACT inhaler Inhale 1-2 puffs into the lungs every 6 (six) hours as needed for wheezing or shortness of breath.    Historical Provider, MD  amiodarone (PACERONE) 200 MG tablet Take 200 mg  by mouth daily.    Historical Provider, MD  amLODipine (NORVASC) 10 MG tablet Take 10 mg by mouth daily.    Historical Provider, MD  Calcium Carb-Cholecalciferol (CALCIUM + D3 PO) Take 1 tablet by mouth daily.     Historical Provider, MD  cyanocobalamin 2000 MCG tablet Take 2,000 mcg by mouth daily.    Historical Provider, MD  furosemide (LASIX) 40 MG tablet Take 20 mg by mouth daily.    Historical Provider, MD  levalbuterol Pauline Aus) 0.63 MG/3ML nebulizer solution Take 3 mLs (0.63 mg total) by nebulization every 6 (six) hours as needed for wheezing or shortness of breath. 10/08/15   Dorothea Ogle, MD  montelukast (SINGULAIR) 10 MG tablet Take 10 mg by mouth every evening.     Historical Provider, MD  pantoprazole (PROTONIX) 40 MG tablet Take 40 mg by mouth daily.     Historical Provider, MD  potassium chloride SA (K-DUR,KLOR-CON) 20 MEQ tablet Take 1 tablet (20 mEq total) by mouth 2 (two) times daily. 05/26/12   Richarda Overlie, MD  pravastatin (PRAVACHOL) 20 MG tablet Take 20 mg by mouth daily.    Historical Provider, MD   BP 123/75 mmHg  Pulse 74  Resp 22  SpO2 95% Physical Exam  Constitutional: She is oriented to person, place, and time. Nasal cannula in place.  HENT:  Right Ear: External ear normal.  Left Ear: External ear normal.  Nose: Nose normal.  Mouth/Throat: Oropharynx is clear and moist. No oropharyngeal exudate.  Eyes: Conjunctivae and EOM are normal. Pupils are equal, round, and reactive to light.  Neck: Normal range of motion. Neck supple.  Cardiovascular: Normal rate, regular rhythm, normal heart sounds and intact distal pulses.   Pulmonary/Chest: Effort normal and breath sounds normal. No respiratory distress. She exhibits no tenderness.  No increased WOB. No tachypnea. Bilateral end expiratory soft wheezes in all lung fields. No rales or rhonchi.   Abdominal: Soft. Bowel sounds are normal. She exhibits no distension. There is no tenderness. There is no rebound and no  guarding.  Musculoskeletal:  Trace bilateral LE pitting edema  Neurological: She is alert and oriented to person, place, and time. No cranial nerve deficit.  Skin: Skin is warm and dry.  Psychiatric: She has a normal mood and affect.  Nursing note and vitals reviewed.  Filed Vitals:   01/03/16 2045 01/03/16 2110 01/03/16 2132 01/03/16 2200  BP:  135/72 118/86   Pulse:   81   Resp: 15 17 20 19   SpO2: 98% 100% 98% 93%     ED Course  Procedures (including critical care time) Labs Review Labs Reviewed  BASIC METABOLIC PANEL - Abnormal; Notable for the following:    CO2 21 (*)  Glucose, Bld 100 (*)    BUN 24 (*)    Creatinine, Ser 1.76 (*)    GFR calc non Af Amer 29 (*)    GFR calc Af Amer 34 (*)    All other components within normal limits  CBC - Abnormal; Notable for the following:    WBC 11.5 (*)    RBC 5.30 (*)    Hemoglobin 11.8 (*)    MCV 72.6 (*)    MCH 22.3 (*)    RDW 17.3 (*)    Platelets 438 (*)    All other components within normal limits  BRAIN NATRIURETIC PEPTIDE - Abnormal; Notable for the following:    B Natriuretic Peptide 156.0 (*)    All other components within normal limits  I-STAT TROPOININ, ED    Imaging Review Dg Chest 2 View  01/03/2016  CLINICAL DATA:  65 year old female with chest pain and shortness of breath and productive cough. EXAM: CHEST  2 VIEW COMPARISON:  Radiograph dated 12/13/2015 FINDINGS: Two views of the chest demonstrates bibasilar linear and hazy interstitial densities corresponding to the interstitial lung disease described on the prior radiograph and CT. There is slight vascular prominence compared to prior study which may represent mild congestive changes. Superimposed pneumonia is less likely but not excluded. Clinical correlation is recommended. There is no focal consolidation, pleural effusion, or pneumothorax. Stable cardiac silhouette. A no acute osseous pathology. IMPRESSION: Bibasilar interstitial densities with mild  increased vascular prominence may represent mild superimposed edema on known interstitial lung disease. No focal consolidation. Electronically Signed   By: Elgie Collard M.D.   On: 01/03/2016 20:24   I have personally reviewed and evaluated these images and lab results as part of my medical decision-making.   EKG Interpretation   Date/Time:  Thursday January 03 2016 19:07:28 EST Ventricular Rate:  70 PR Interval:  168 QRS Duration: 98 QT Interval:  517 QTC Calculation: 558 R Axis:   48 Text Interpretation:  Sinus rhythm Left atrial enlargement ST elevation,  consider inferior injury Prolonged QT interval Baseline wander in lead(s)  II III aVL aVF ED PHYSICIAN INTERPRETATION AVAILABLE IN [facility]  Confirmed by TEST, Record ([id number]) on 01/04/2016 7:13:44 AM      MDM   Final diagnoses:  Acute on chronic respiratory failure with hypoxia (HCC)    CXR shows bibasilar interstitial densities that might be superimposed edema on known ILD. Cannot r/o pneumonia. No focal consolidations. Pt does endorse productive cough but will hold off on abx therapy for now. BNP 156 which is elevated from prior. WBC 11.5 which is actually better than past values. Cr 1.76 which is high for pt. Most concerning, however, is pt's new oxygen requirement. She does not wear supplemental O2 at home. She de-sats with ambulation to mid 70s. Given this hypoxia / new oxygen requirement will call hospitalist for admission. Her initial wheezing cleared with duoneb. Prednisone given. IV lasix given as well.   Spoke to Dr. Katrinka Blazing of hospitalist service will admit pt to tele.   Carlene Coria, PA-C 01/04/16 1607  Nelva Nay, MD 01/05/16 [phone]

## 2016-01-03 NOTE — ED Notes (Signed)
Hospitalist at bedside 

## 2016-01-03 NOTE — ED Notes (Signed)
Writer tried twice unsuccessful attempt.

## 2016-01-03 NOTE — ED Notes (Signed)
PA at bedside.

## 2016-01-03 NOTE — ED Notes (Signed)
Pt. Short of breath walking back from bathroom, SpO2 79% when returned back to room. Nurse aware

## 2016-01-03 NOTE — ED Notes (Signed)
IV team at bedside 

## 2016-01-03 NOTE — H&P (Signed)
[facility] History and Physical  [name] OHY:[id number] DOB: [birth year] DOA: 01/03/2016  Referring physician: ED PCP: [name], MD   Chief Complaint: Shortness of breath on exertion  HPI:   [name] is a 65 year old female with a past medical history significant for tobacco abuse, paroxysmal atrial fibrillation, hypertension, hyperlipidemia, chronic kidney disease stage III; who presents for complaints of worsening shortness of breath. Symptoms have occurred off and on for months, but worsened over the last week. At baseline patient reports being able to get around her house and shop without difficulty. Over the last week she's noticed that even getting in and around her house was difficult to treat her symptoms of shortness of breath. She reports having a productive cough with clear sputum. Patient notes that she went to the grocery store and almost passed out the other day. Denies any fever, chills, nausea, vomiting, constipation, or diarrhea. Patient sees Dr. Sherene Sires of pulmonology. She also complains of significant lower extremity leg pain which she's had previous treatment with steriod injections, but did not want to have any surgical procedure done to her knee.  Upon admission to the emergency department patient is evaluated and seen to be short of breath with O2 sats in the mid 80s on admission. O2 sats improved to 90% on 3 L nasal cannula oxygen, but quickly desaturated if patient moved or exerted herself.   Review of Systems  Constitutional: Positive for malaise/fatigue. Negative for fever and diaphoresis.  HENT: Negative for ear discharge and ear pain.   Eyes: Negative for photophobia and pain.  Respiratory: Positive for cough and shortness of breath.   Cardiovascular: Positive for leg swelling. Negative for palpitations.       Chest tightness   Gastrointestinal: Negative for vomiting, abdominal pain and diarrhea.  Genitourinary: Negative for urgency and  frequency.  Musculoskeletal: Positive for joint pain. Negative for falls.  Skin: Negative for itching and rash.  Neurological: Negative for speech change and focal weakness.  Psychiatric/Behavioral: Negative for suicidal ideas and hallucinations.        Past Medical History  Diagnosis Date  . Hypertension   . Borderline diabetes   . Irregular heart beat   . Hyperlipidemia   . CHF (congestive heart failure) (HCC)   . CKD (chronic kidney disease) stage 3, GFR 30-59 ml/min   . Dysrhythmia     A-fib  . Anxiety   . Depression   . Shortness of breath     on exertion  . GERD (gastroesophageal reflux disease)   . Headache(784.0)   . Arthritis     knees  . Anemia      Past Surgical History  Procedure Laterality Date  . Hemorrhoid surgery    . Esophagogastroduodenoscopy N/A 02/25/2013    Procedure: ESOPHAGOGASTRODUODENOSCOPY (EGD);  Surgeon: Theda Belfast, MD;  Location: [facility];  Service: Endoscopy;  Laterality: N/A;  . Colonoscopy N/A 02/25/2013    Procedure: COLONOSCOPY;  Surgeon: Theda Belfast, MD;  Location: [facility];  Service: Endoscopy;  Laterality: N/A;  . Givens capsule study N/A 02/26/2013    Procedure: GIVENS CAPSULE STUDY;  Surgeon: Theda Belfast, MD;  Location: [facility];  Service: Endoscopy;  Laterality: N/A;      Social History:  reports that she has been smoking Cigarettes.  She has a 11.25 pack-year smoking history. She uses smokeless tobacco. She reports that she uses illicit drugs (Marijuana). She reports that she does not drink alcohol.   Allergies  Allergen Reactions  .  Levaquin [Levofloxacin] Hives  . Penicillins Swelling    Has patient had a PCN reaction causing immediate rash, facial/tongue/throat swelling, SOB or lightheadedness with hypotension yes Has patient had a PCN reaction causing severe rash involving mucus membranes or skin necrosis: unknown Has patient had a PCN reaction that required hospitalization yes Has patient had a PCN  reaction occurring within the last 10 years: no If all of the above answers are "NO", then may proceed with Cephalosporin use.     Family History  Problem Relation Age of Onset  . Kidney disease Mother   . Heart attack Mother   . Kidney disease Father   . Hypertension Mother   . Breast cancer Daughter   . Heart disease Brother        Prior to Admission medications   Medication Sig Start Date End Date Taking? Authorizing Provider  albuterol (PROVENTIL HFA;VENTOLIN HFA) 108 (90 BASE) MCG/ACT inhaler Inhale 1-2 puffs into the lungs every 6 (six) hours as needed for wheezing or shortness of breath.   Yes Historical Provider, MD  albuterol (PROVENTIL) (2.5 MG/3ML) 0.083% nebulizer solution Take 2.5 mg by nebulization every 6 (six) hours as needed for wheezing or shortness of breath.   Yes Historical Provider, MD  amiodarone (PACERONE) 200 MG tablet Take 200 mg by mouth daily.   Yes Historical Provider, MD  amLODipine (NORVASC) 10 MG tablet Take 10 mg by mouth daily.   Yes Historical Provider, MD  Calcium Carb-Cholecalciferol (CALCIUM + D3 PO) Take 1 tablet by mouth daily.    Yes Historical Provider, MD  cyanocobalamin 2000 MCG tablet Take 2,000 mcg by mouth daily.   Yes Historical Provider, MD  furosemide (LASIX) 40 MG tablet Take 20 mg by mouth daily.   Yes Historical Provider, MD  levalbuterol (XOPENEX) 0.63 MG/3ML nebulizer solution Take 3 mLs (0.63 mg total) by nebulization every 6 (six) hours as needed for wheezing or shortness of breath. 10/08/15  Yes Dorothea Ogle, MD  montelukast (SINGULAIR) 10 MG tablet Take 10 mg by mouth every evening.    Yes Historical Provider, MD  oxyCODONE-acetaminophen (PERCOCET) 7.5-325 MG tablet Take 1 tablet by mouth every 6 (six) hours. 12/19/15  Yes Historical Provider, MD  pantoprazole (PROTONIX) 40 MG tablet Take 40 mg by mouth daily.    Yes Historical Provider, MD  potassium chloride SA (K-DUR,KLOR-CON) 20 MEQ tablet Take 1 tablet (20 mEq total) by  mouth 2 (two) times daily. 05/26/12  Yes Richarda Overlie, MD  pravastatin (PRAVACHOL) 20 MG tablet Take 20 mg by mouth daily.   Yes Historical Provider, MD     Physical Exam: Filed Vitals:   01/03/16 2110 01/03/16 2132 01/03/16 2200 01/03/16 2310  BP: 135/72 118/86  140/65  Pulse:  81  80  Temp:    98.2 F (36.8 C)  TempSrc:    Oral  Resp: Height:     (1.676 m)  Weight:    91.218 kg (201 lb 1.6 oz)  SpO2: 100% 98% 93% 97%     Constitutional: Vital signs reviewed. Patient is a well-developed and well-nourished in mld distress , but cooperative with exam. Alert and oriented x3.  Head: Normocephalic and atraumatic  Ear: TM normal bilaterally  Mouth: no erythema or exudates, MMM  Eyes: PERRL, EOMI, conjunctivae normal, No scleral icterus.  Neck: Supple, Trachea midline normal ROM, No JVD, mass, thyromegaly, or carotid bruit present.  Cardiovascular: Bilateral expiratory wheezes appreciated in both lung fields no significant  rhonchi appreciated. Pulmonary/Chest: CTAB, no wheezes, rales, or rhonchi  Abdominal: Soft. Non-tender, non-distended, bowel sounds are normal, no masses, organomegaly, or guarding present.  GU: no CVA tenderness Musculoskeletal: No joint deformities, erythema, or stiffness, ROM full and no nontender Ext: 1+ plus pitting edema and no cyanosis, pulses palpable bilaterally (DP and PT)  Hematology: no cervical, inginal, or axillary adenopathy.  Neurological: A&O x3, Strenght is normal and symmetric bilaterally, cranial nerve II-XII are grossly intact, no focal motor deficit, sensory intact to light touch bilaterally.  Skin: Warm, diuretic  No rash, cyanosis, or clubbing.  Psychiatric: Normal mood and affect. speech and behavior is normal. Judgment and thought content normal. Cognition and memory are normal.      Data Review   Micro Results No results found for this or any previous visit (from the past 240 hour(s)).  Radiology Reports Dg Chest 2  View  01/03/2016  CLINICAL DATA:  65 year old female with chest pain and shortness of breath and productive cough. EXAM: CHEST  2 VIEW COMPARISON:  Radiograph dated 12/13/2015 FINDINGS: Two views of the chest demonstrates bibasilar linear and hazy interstitial densities corresponding to the interstitial lung disease described on the prior radiograph and CT. There is slight vascular prominence compared to prior study which may represent mild congestive changes. Superimposed pneumonia is less likely but not excluded. Clinical correlation is recommended. There is no focal consolidation, pleural effusion, or pneumothorax. Stable cardiac silhouette. A no acute osseous pathology. IMPRESSION: Bibasilar interstitial densities with mild increased vascular prominence may represent mild superimposed edema on known interstitial lung disease. No focal consolidation. Electronically Signed   By: Elgie Collard M.D.   On: 01/03/2016 20:24   Dg Chest 2 View  12/13/2015  CLINICAL DATA:  Dyspnea.  Cough on amiodarone EXAM: CHEST  2 VIEW COMPARISON:  10/24/2015 FINDINGS: Interstitial lung disease with basilar predominant fine interstitial opacities and changes of bronchiectasis. No signs of honeycombing. No superimposed pneumonia or edema. No effusion or pneumothorax. Chronic prominence of the main pulmonary artery, likely pulmonary hypertension. IMPRESSION: Interstitial lung disease without convincing progression since 2016. Electronically Signed   By: Marnee Spring M.D.   On: 12/13/2015 15:31     CBC  Recent Labs Lab 01/03/16 1936  WBC 11.5*  HGB 11.8*  HCT 38.5  PLT 438*  MCV 72.6*  MCH 22.3*  MCHC 30.6  RDW 17.3*    Chemistries   Recent Labs Lab 01/03/16 1936  NA 140  K 4.3  CL 108  CO2 21*  GLUCOSE 100*  BUN 24*  CREATININE 1.76*  CALCIUM 10.0   ------------------------------------------------------------------------------------------------------------------ estimated creatinine clearance is  36.3 mL/min (by C-G formula based on Cr of 1.76). ------------------------------------------------------------------------------------------------------------------ No results for input(s): HGBA1C in the last 72 hours. ------------------------------------------------------------------------------------------------------------------ No results for input(s): CHOL, HDL, LDLCALC, TRIG, CHOLHDL, LDLDIRECT in the last 72 hours. ------------------------------------------------------------------------------------------------------------------ No results for input(s): TSH, T4TOTAL, T3FREE, THYROIDAB in the last 72 hours.  Invalid input(s): FREET3 ------------------------------------------------------------------------------------------------------------------ No results for input(s): VITAMINB12, FOLATE, FERRITIN, TIBC, IRON, RETICCTPCT in the last 72 hours.  Coagulation profile No results for input(s): INR, PROTIME in the last 168 hours.  No results for input(s): DDIMER in the last 72 hours.  Cardiac Enzymes No results for input(s): CKMB, TROPONINI, MYOGLOBIN in the last 168 hours.  Invalid input(s): CK ------------------------------------------------------------------------------------------------------------------ Invalid input(s): POCBNP   CBG: No results for input(s): GLUCAP in the last 168 hours.     EKG: Independently reviewed. Sinus rhythm with left atrial enlargement. Prolonged QT C558  Assessment/Plan Acute on chronic respiratory failure with hypoxia: Patient presents with shortness of breath on exertion that appears to have possibly been related to acute COPD exacerbation versus CAP versus diastolic heart failure. Patient with elevated white blood cell count of 11.5 and O2 saturations in the 80s on room air. - Admit to telemetry bed - Empiric antibiotics of Rocephin and doxycycline - Sputum culture  - Ipratropium, budesonide, Brovana, Levalbuterol nebs scheduled - Recheck  chest x-ray in a.m.  Acute on chronic diastolic CHF (congestive heart failure): Patient with a mildly elevated BNP of 156 on admission previously BNP was only 75. Patient was given 40 mg of Lasix in the ED. - Strict ins and outs - Reassess for need of further diuresis /restart home regimen  Acute kidney injury on chronic kidney disease. Baseline creatinine had been around 1.4, but acutely elevated at 1.76 on admission. - Reassess kidney function in a.m. after Lasix dose given  Paroxysmal atrial fibrillation: Patient appear to be in sinus rhythm on admission patient notes a previous history of being on Coumadin previously in the past but reported bleeding complications. chadvasc score 4.  - Follow-up telemetry overnight - Continue amiodarone. Previously questioned if this was likely cause of patient's worsening lung function  Essential hypertension - Continue amlodipine  Osteoarthritis of the knees: Chronic - Continue regimen for pain control - Low dose diluadid  for breakthrough pain   Tobacco abuse: Patient reports still smoking anywhere fourth cigarettes per day's likely be a precipitant of her acute presentation. - Counseled on the need of smoking cessation  Code Status:   full Family Communication: bedside Disposition Plan: admit   Total time spent 55 minutes.Greater than 50% of this time was spent in counseling, explanation of diagnosis, planning of further management, and coordination of care  Clydie Braun [facility] Pager [phone]  If 7PM-7AM, please contact night-coverage [email or web address] Password [email or web address] 01/03/2016, 11:40 PM

## 2016-01-03 NOTE — ED Notes (Signed)
Pt transported to XRay 

## 2016-01-03 NOTE — ED Notes (Signed)
Pt ambulated to bathroom 

## 2016-01-03 NOTE — ED Notes (Signed)
Shanda Bumps, RN attempting IV insertion

## 2016-01-04 ENCOUNTER — Inpatient Hospital Stay (HOSPITAL_COMMUNITY): Payer: Medicare Other

## 2016-01-04 ENCOUNTER — Encounter (HOSPITAL_COMMUNITY): Payer: Self-pay

## 2016-01-04 DIAGNOSIS — M170 Bilateral primary osteoarthritis of knee: Secondary | ICD-10-CM | POA: Diagnosis present

## 2016-01-04 DIAGNOSIS — J9621 Acute and chronic respiratory failure with hypoxia: Secondary | ICD-10-CM

## 2016-01-04 DIAGNOSIS — I480 Paroxysmal atrial fibrillation: Secondary | ICD-10-CM

## 2016-01-04 DIAGNOSIS — I10 Essential (primary) hypertension: Secondary | ICD-10-CM

## 2016-01-04 DIAGNOSIS — I5033 Acute on chronic diastolic (congestive) heart failure: Principal | ICD-10-CM

## 2016-01-04 LAB — TROPONIN I: Troponin I: <0.03 ng/mL (ref <0.031)

## 2016-01-04 LAB — BASIC METABOLIC PANEL
Anion gap: 12 (ref 5–15)
BUN: 22 mg/dL — AB (ref 6–20)
CO2: 20 mmol/L — AB (ref 22–32)
CREATININE: 1.3 mg/dL — AB (ref 0.44–1.00)
Calcium: 9.8 mg/dL (ref 8.9–10.3)
Chloride: 104 mmol/L (ref 101–111)
GFR calc Af Amer: 49 mL/min — ABNORMAL LOW (ref >60)
GFR, EST NON AFRICAN AMERICAN: 42 mL/min — AB (ref >60)
Glucose, Bld: 148 mg/dL — ABNORMAL HIGH (ref 65–99)
POTASSIUM: 4.4 mmol/L (ref 3.5–5.1)
Sodium: 136 mmol/L (ref 135–145)

## 2016-01-04 LAB — CBC
HEMATOCRIT: 36.7 % (ref 36.0–46.0)
Hemoglobin: 10.9 g/dL — ABNORMAL LOW (ref 12.0–15.0)
MCH: 21.8 pg — ABNORMAL LOW (ref 26.0–34.0)
MCHC: 29.7 g/dL — ABNORMAL LOW (ref 30.0–36.0)
MCV: 73.5 fL — AB (ref 78.0–100.0)
PLATELETS: 441 10*3/uL — AB (ref 150–400)
RBC: 4.99 MIL/uL (ref 3.87–5.11)
RDW: 17.4 % — AB (ref 11.5–15.5)
WBC: 9.1 10*3/uL (ref 4.0–10.5)

## 2016-01-04 MED ORDER — HYDROMORPHONE HCL 1 MG/ML IJ SOLN: 0.5000 mg | INTRAMUSCULAR | Status: DC | PRN

## 2016-01-04 MED ORDER — OXYCODONE HCL 5 MG PO TABS
15.0000 mg | ORAL_TABLET | ORAL | Status: DC | PRN
  Administered 2016-01-04: 15 mg via ORAL
  Filled 2016-01-04: qty 3

## 2016-01-04 MED ORDER — OXYCODONE HCL 5 MG PO TABS
10.0000 mg | ORAL_TABLET | ORAL | Status: DC | PRN
  Administered 2016-01-04: 10 mg via ORAL
  Filled 2016-01-04: qty 2

## 2016-01-04 MED ORDER — HYDROMORPHONE HCL 1 MG/ML IJ SOLN
0.5000 mg | Freq: Once | INTRAMUSCULAR | Status: AC
  Administered 2016-01-04: 0.5 mg via INTRAVENOUS
  Filled 2016-01-04: qty 1

## 2016-01-04 MED ORDER — HYDROMORPHONE HCL 1 MG/ML IJ SOLN
1.0000 mg | INTRAMUSCULAR | Status: DC | PRN
  Administered 2016-01-04: 1 mg via INTRAVENOUS
  Filled 2016-01-04: qty 1

## 2016-01-04 MED ORDER — HYDROMORPHONE HCL 1 MG/ML IJ SOLN
0.5000 mg | INTRAMUSCULAR | Status: DC | PRN
  Administered 2016-01-04: 0.5 mg via INTRAVENOUS
  Filled 2016-01-04: qty 1

## 2016-01-04 MED ORDER — FUROSEMIDE 10 MG/ML IJ SOLN
40.0000 mg | Freq: Two times a day (BID) | INTRAMUSCULAR | Status: DC
  Administered 2016-01-04 – 2016-01-05 (×3): 40 mg via INTRAVENOUS
  Filled 2016-01-04 (×3): qty 4

## 2016-01-04 MED ORDER — DICLOFENAC SODIUM 1 % TD GEL
4.0000 g | Freq: Four times a day (QID) | TRANSDERMAL | Status: DC
  Administered 2016-01-04 – 2016-01-06 (×8): 4 g via TOPICAL
  Filled 2016-01-04: qty 100

## 2016-01-04 MED ORDER — HYDROMORPHONE HCL 1 MG/ML IJ SOLN
1.0000 mg | Freq: Once | INTRAMUSCULAR | Status: AC
  Administered 2016-01-04: 1 mg via INTRAVENOUS
  Filled 2016-01-04: qty 1

## 2016-01-04 MED ORDER — DOXYCYCLINE HYCLATE 100 MG IV SOLR
100.0000 mg | Freq: Two times a day (BID) | INTRAVENOUS | Status: DC
  Administered 2016-01-04 (×2): 100 mg via INTRAVENOUS
  Filled 2016-01-04 (×3): qty 100

## 2016-01-04 NOTE — Progress Notes (Signed)
SATURATION QUALIFICATIONS: (This note is used to comply with regulatory documentation for home oxygen)  Patient Saturations on Room Air at Rest = 93%  Patient Saturations on Room Air while Ambulating = 82%  Patient Saturations on 3 Liters of oxygen while Ambulating = 92%  Please briefly explain why patient needs home oxygen:

## 2016-01-04 NOTE — Progress Notes (Signed)
[facility] PROGRESS NOTE  [name] ZOX:[id number] DOB: [birth year] DOA: 01/03/2016 PCP: [name], MD  Brief Summary  The patient is a 65 year old female with history of tobacco abuse, paroxysmal atrial fibrillation, hypertension, hyperlipidemia, chronic kidney disease stage III who presented with progressive shortness of breath. Over the last week, she noticed difficulty getting and it and out of the car to shop and moving around her house without significant shortness of breath requiring rest. She had a cough productive of clear sputum, increased lower extremity edema. She denied fevers, chills, productive cough. In the emergency department, she was noted to be hypoxic to the mid 80s on room air and recovered on 3 L nasal cannula. Her chest x-ray demonstrated by basilar interstitial densities with mild increased vascular prominence. She was given Lasix and started on treatment for possible acute COPD exacerbation with antibiotics, steroids, and nebulizer treatments.  Assessment/Plan  Acute on chronic respiratory failure with hypoxia likely secondary to acute on chronic diastolic heart failure.  - Telemetry: Normal sinus rhythm - Since she is afebrile and without leukocytosis, discontinue antibiotics - Discontinue Ipratropium, budesonide, Brovana, Levalbuterol nebs  - Start Lasix 40 mg IV twice a day -  Daily weights and strict ins and outs  Acute kidney injury on chronic kidney disease, likely secondary to cardiorenal syndrome and improved with diuresis. -  Continue daily BMP  Paroxysmal atrial fibrillation: Patient appear to be in sinus rhythm on admission patient notes a previous history of being on Coumadin previously in the past but reported bleeding complications. chadvasc score 4.  -Telemetry: Normal sinus rhythm - Continue amiodarone. Previously questioned if this was likely cause of patient's worsening lung function  Essential hypertension, stable  -  Continue amlodipine  Osteoarthritis of the knees: Chronic but with severe pain  -  start diclofenac to bilateral knees -  X-ray of the right knee: Tricompartmental arthritis -  Physical therapy -  Reduce narcotic pain medication  Tobacco abuse: Patient reports still smoking anywhere fourth cigarettes per day's likely be a precipitant of her acute presentation. - Counseled on the need of smoking cessation   Diet:  Low-sodium Access:  PIV IVF:  Off Proph:  Lovenox  Code Status:  Full code Family Communication:  Patient and her brother Disposition Plan:  Pending ongoing diuresis, likely home in 3-4 days   Consultants:   None  Procedures:   Chest x-ray  Antibiotics:   Ceftriaxone and doxycycline x 1 dose  HPI/Subjective:  Patient states that she voided frequently overnight and is feeling much better this morning. She continues to have severe pain in bilateral knees, right worse than left. She receives steroid injections into her knees on occasion when the pain is severe. She has no history of asthma or COPD despite ongoing smoking.  Objective: Filed Vitals:   01/03/16 2346 01/04/16 0621 01/04/16 1225 01/04/16 1331  BP:  130/61 126/58 125/57  Pulse:  71 73 76  Temp:  98.1 F (36.7 C)  98 F (36.7 C)  TempSrc:  Oral  Oral  Resp:  18  19  Height:      Weight:      SpO2: 98% 93%  96%    Intake/Output Summary (Last 24 hours) at 01/04/16 1451 Last data filed at 01/04/16 1400  Gross per 24 hour  Intake    660 ml  Output    500 ml  Net    160 ml   Filed Weights   01/03/16 2310  Weight: 91.218 kg (201  lb 1.6 oz)   Body mass index is 32.47 kg/(m^2).  Exam:   General:  Adult female,No acute distress  HEENT:  NCAT, MMM  Cardiovascular:  RRR, nl S1, S2 no mrg, 2+ pulses, warm extremities  Respiratory:  Rales at the bilateral bases, no wheezes, or rhonchi no increased WOB  Abdomen:   NABS, soft, NT/ND  MSK:   Normal tone and bulk,  2+ pitting bilateral  LEE, right knee is mildly erythematous, small effusion palpable, tender to palpation along the joint line   Neuro:  Grossly intact  Data Reviewed: Basic Metabolic Panel:  Recent Labs Lab 01/03/16 1936 01/04/16 0501  NA 140 136  K 4.3 4.4  CL 108 104  CO2 21* 20*  GLUCOSE 100* 148*  BUN 24* 22*  CREATININE 1.76* 1.30*  CALCIUM 10.0 9.8   Liver Function Tests: No results for input(s): AST, ALT, ALKPHOS, BILITOT, PROT, ALBUMIN in the last 168 hours. No results for input(s): LIPASE, AMYLASE in the last 168 hours. No results for input(s): AMMONIA in the last 168 hours. CBC:  Recent Labs Lab 01/03/16 1936 01/04/16 0501  WBC 11.5* 9.1  HGB 11.8* 10.9*  HCT 38.5 36.7  MCV 72.6* 73.5*  PLT 438* 441*    No results found for this or any previous visit (from the past 240 hour(s)).   Studies: Dg Chest 2 View  01/03/2016  CLINICAL DATA:  65 year old female with chest pain and shortness of breath and productive cough. EXAM: CHEST  2 VIEW COMPARISON:  Radiograph dated 12/13/2015 FINDINGS: Two views of the chest demonstrates bibasilar linear and hazy interstitial densities corresponding to the interstitial lung disease described on the prior radiograph and CT. There is slight vascular prominence compared to prior study which may represent mild congestive changes. Superimposed pneumonia is less likely but not excluded. Clinical correlation is recommended. There is no focal consolidation, pleural effusion, or pneumothorax. Stable cardiac silhouette. A no acute osseous pathology. IMPRESSION: Bibasilar interstitial densities with mild increased vascular prominence may represent mild superimposed edema on known interstitial lung disease. No focal consolidation. Electronically Signed   By: Elgie Collard M.D.   On: 01/03/2016 20:24   Dg Knee 1-2 Views Right  01/04/2016  CLINICAL DATA:  Patient with diffuse right knee pain.  No injury. EXAM: RIGHT KNEE - 1-2 VIEW COMPARISON:  MRI right knee  12/09/2014 FINDINGS: Normal anatomic alignment. No evidence for acute fracture or dislocation. Tricompartmental osteophytosis. Medial compartment joint space narrowing. No joint effusion. Nonspecific calcific density about the anterior and medial tibial plateau. IMPRESSION: No acute osseous abnormality. Tricompartmental osteoarthritis. Electronically Signed   By: Annia Belt M.D.   On: 01/04/2016 11:13    Scheduled Meds: . amiodarone  200 mg Oral Daily  . amLODipine  10 mg Oral Daily  . arformoterol  15 mcg Nebulization BID  . budesonide (PULMICORT) nebulizer solution  0.5 mg Nebulization BID  . calcium-vitamin D  1 tablet Oral Daily  . cefTRIAXone (ROCEPHIN)  IV  1 g Intravenous QHS  . diclofenac sodium  4 g Topical QID  . doxycycline (VIBRAMYCIN) IV  100 mg Intravenous BID  . enoxaparin (LOVENOX) injection  40 mg Subcutaneous QHS  . furosemide  40 mg Intravenous BID  . guaiFENesin  600 mg Oral BID  . ipratropium  0.5 mg Nebulization BID  . levalbuterol  0.63 mg Nebulization BID  . montelukast  10 mg Oral QPM  . pantoprazole  40 mg Oral Daily  . potassium chloride SA  20  mEq Oral BID  . pravastatin  20 mg Oral Daily  . sodium chloride flush  3 mL Intravenous Q12H  . cyanocobalamin  2,000 mcg Oral Daily   Continuous Infusions:   Principal Problem:   Acute on chronic respiratory failure with hypoxia (HCC) Active Problems:   Cigarette smoker   Essential hypertension   Acute on chronic diastolic CHF (congestive heart failure) (HCC)   PAF (paroxysmal atrial fibrillation) (HCC)   Osteoarthritis of both knees    Time spent: 30 min    Tinika Bucknam  [facility] Pager [phone]. If 7PM-7AM, please contact night-coverage at [email or web address], password [facility] 01/04/2016, 2:51 PM  LOS: 1 day

## 2016-01-04 NOTE — Progress Notes (Signed)
Pharmacy Antibiotic Note  [name] is a 65 y.o. female admitted on 01/03/2016 with CAP.  Pharmacy has been consulted for Rocephin dosing.  Plan: After discussion with Dr. Katrinka Blazing, antibiotics will be changed Doxy 100mg  q12h d/t d/d interaction b/w zmax/amiodaron and baseline QTc 558.  Rocephin 1Gm IV q24h  Height: 5\' 6"  (167.6 cm) Weight: 201 lb 1.6 oz (91.218 kg) IBW/kg (Calculated) : 59.3  Temp (24hrs), Avg:98.2 F (36.8 C), Min:98.2 F (36.8 C), Max:98.2 F (36.8 C)   Recent Labs Lab 01/03/16 1936  WBC 11.5*  CREATININE 1.76*    Estimated Creatinine Clearance: 36.3 mL/min (by C-G formula based on Cr of 1.76).    Allergies  Allergen Reactions  . Levaquin [Levofloxacin] Hives  . Penicillins Swelling    Has patient had a PCN reaction causing immediate rash, facial/tongue/throat swelling, SOB or lightheadedness with hypotension yes Has patient had a PCN reaction causing severe rash involving mucus membranes or skin necrosis: unknown Has patient had a PCN reaction that required hospitalization yes Has patient had a PCN reaction occurring within the last 10 years: no If all of the above answers are "NO", then may proceed with Cephalosporin use.     Antimicrobials this admission: 2/10 rocephin >>  2/10 doxy >>   Dose adjustments this admission:   Microbiology results:  BCx:   UCx:    Sputum:    MRSA PCR:   Thank you for allowing pharmacy to be a part of this patient's care.  4/10 01/04/2016 12:28 AM

## 2016-01-05 ENCOUNTER — Inpatient Hospital Stay (HOSPITAL_COMMUNITY): Payer: Medicare Other

## 2016-01-05 DIAGNOSIS — Z720 Tobacco use: Secondary | ICD-10-CM

## 2016-01-05 DIAGNOSIS — J984 Other disorders of lung: Secondary | ICD-10-CM

## 2016-01-05 DIAGNOSIS — T462X5A Adverse effect of other antidysrhythmic drugs, initial encounter: Secondary | ICD-10-CM

## 2016-01-05 LAB — BASIC METABOLIC PANEL
ANION GAP: 10 (ref 5–15)
BUN: 28 mg/dL — AB (ref 6–20)
CALCIUM: 9.5 mg/dL (ref 8.9–10.3)
CO2: 24 mmol/L (ref 22–32)
Chloride: 105 mmol/L (ref 101–111)
Creatinine, Ser: 1.25 mg/dL — ABNORMAL HIGH (ref 0.44–1.00)
GFR calc Af Amer: 51 mL/min — ABNORMAL LOW (ref >60)
GFR, EST NON AFRICAN AMERICAN: 44 mL/min — AB (ref >60)
GLUCOSE: 102 mg/dL — AB (ref 65–99)
POTASSIUM: 4.4 mmol/L (ref 3.5–5.1)
SODIUM: 139 mmol/L (ref 135–145)

## 2016-01-05 LAB — CBC
HCT: 38.2 % (ref 36.0–46.0)
HEMOGLOBIN: 11.5 g/dL — AB (ref 12.0–15.0)
MCH: 22.3 pg — AB (ref 26.0–34.0)
MCHC: 30.1 g/dL (ref 30.0–36.0)
MCV: 74 fL — ABNORMAL LOW (ref 78.0–100.0)
PLATELETS: 444 10*3/uL — AB (ref 150–400)
RBC: 5.16 MIL/uL — AB (ref 3.87–5.11)
RDW: 17.3 % — ABNORMAL HIGH (ref 11.5–15.5)
WBC: 14.1 10*3/uL — AB (ref 4.0–10.5)

## 2016-01-05 LAB — PROCALCITONIN

## 2016-01-05 MED ORDER — HYDROCOD POLST-CPM POLST ER 10-8 MG/5ML PO SUER
5.0000 mL | Freq: Two times a day (BID) | ORAL | Status: DC | PRN
  Administered 2016-01-05: 5 mL via ORAL
  Filled 2016-01-05: qty 5

## 2016-01-05 MED ORDER — FUROSEMIDE 40 MG PO TABS
40.0000 mg | ORAL_TABLET | Freq: Every day | ORAL | Status: DC
  Administered 2016-01-06: 40 mg via ORAL
  Filled 2016-01-05: qty 1

## 2016-01-05 MED ORDER — LEVALBUTEROL HCL 1.25 MG/0.5ML IN NEBU
1.2500 mg | INHALATION_SOLUTION | Freq: Four times a day (QID) | RESPIRATORY_TRACT | Status: DC | PRN
  Filled 2016-01-05: qty 0.5

## 2016-01-05 MED ORDER — OXYCODONE HCL 5 MG PO TABS
15.0000 mg | ORAL_TABLET | Freq: Four times a day (QID) | ORAL | Status: DC | PRN
  Administered 2016-01-05 – 2016-01-06 (×3): 15 mg via ORAL
  Filled 2016-01-05 (×4): qty 3

## 2016-01-05 NOTE — Progress Notes (Signed)
[facility] PROGRESS NOTE  [name] MHD:[id number] DOB: 1951/11/13 DOA: 01/03/2016 PCP: [name], MD  Brief Summary  The patient is a 65 year old female with history of tobacco abuse, paroxysmal atrial fibrillation, hypertension, hyperlipidemia, chronic kidney disease stage III who presented with progressive shortness of breath. Over the last week, she noticed difficulty getting and it and out of the car to shop and moving around her house without significant shortness of breath requiring rest. She had a cough productive of clear sputum, increased lower extremity edema. She denied fevers, chills, productive cough. In the emergency department, she was noted to be hypoxic to the mid 80s on room air and recovered on 3 L nasal cannula. Her chest x-ray demonstrated by basilar interstitial densities with mild increased vascular prominence. She was given Lasix and started on treatment for possible acute COPD exacerbation with antibiotics, steroids, and nebulizer treatments.  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  Seen by Dr. Sherene Sires a month ago who recommended a trial off of amiodarone.  Amiodarone was started for refractory a-fib during hospitalization in January 2015 by Dr. Jacinto Halim.  Patient currently trying to transfer care to new cardiologist and has been continuing amiodarone.  Dyspnea improved with diuresis but still having orthopnea and bad cough this morning.  WBC rose but she received steroids yesterday.  Could have a superimposed pneumonia but she was afebrile when she arrived.  Will check procalcitonin and discuss case with pulmonology.   - Telemetry: Normal sinus rhythm - repeat CXR -  Check procalcitonin:  <0.1 so will NOT give antibiotics -  Daily weights and strict ins and outs -  Transition to room air and ambulatory pulse ox  -   D/c amiodarone -  Case discussed with pulmonology who recommended continuing diuresis until dry and then close follow up with pulmonology and cardiology  Acute kidney injury on chronic kidney disease, likely secondary to cardiorenal syndrome and improved with diuresis. -  Continue daily BMP  Paroxysmal atrial fibrillation: Patient appear to be in sinus rhythm on admission patient notes a previous history of being on Coumadin previously in the past but reported bleeding complications. chadvasc score 4.  -Telemetry: Normal sinus rhythm - d/c amiodarone, will linger until her follow up appointment with cardiology and may be worsening her lung function - hx of bradycardia  Essential hypertension, stable  - Continue amlodipine  Osteoarthritis of the knees: Chronic but with severe pain  -  Continue diclofenac to bilateral knees -  X-ray of the right knee: Tricompartmental arthritis -  Physical therapy -  Continue home narcotic medication (followed at pain clinic)  Tobacco abuse: Patient reports still smoking anywhere fourth cigarettes per day's likely be a precipitant of her acute presentation. - Counseled on the need of smoking cessation  Leukocytosis, likely due to steroids.   -  Repeat CBC in AM  Diet:  Low-sodium Access:  PIV IVF:  Off Proph:  Lovenox  Code Status:  Full code Family Communication:  Patient and her brother Disposition Plan:  Transitioning to room air, repeat labs in AM, plan to d/c home.     Consultants:   None  Procedures:   Chest x-ray  Antibiotics:   Ceftriaxone and doxycycline x 1 dose  HPI/Subjective:  States she is breathing more easily and was able to walk in the halls by herself without oxygen with less dyspnea today.  She wants to  go home, but is still having a severe cough.  States nebulizer treatments help somewhat.  Denies fevers, chills.    Objective: Filed Vitals:   01/04/16 1331 01/04/16 2108 01/04/16 2129 01/05/16 0533  BP: 125/57   123/59 126/64  Pulse: 76  67 58  Temp: 98 F (36.7 C)  98 F (36.7 C) 98.3 F (36.8 C)  TempSrc: Oral  Oral Oral  Resp: 19  18 18   Height:      Weight:    91.218 kg (201 lb 1.6 oz)  SpO2: 96% 97% 95% 97%    Intake/Output Summary (Last 24 hours) at 01/05/16 0728 Last data filed at 01/05/16 0700  Gross per 24 hour  Intake    840 ml  Output    500 ml  Net    340 ml   Filed Weights   01/03/16 2310 01/05/16 0533  Weight: 91.218 kg (201 lb 1.6 oz) 91.218 kg (201 lb 1.6 oz)   Body mass index is 32.47 kg/(m^2).  Exam:   General:  Adult female, No acute distress  HEENT:  NCAT, MMM  Cardiovascular:  RRR, nl S1, S2 no mrg, 2+ pulses, warm extremities  Respiratory:  Rales particularly over the right mid-back but also at the bilateral bases, no rhonchi or wheeze, increased WOB  Abdomen:   NABS, soft, NT/ND  MSK:   Normal tone and bulk, no residual LEE, right knee is mildly erythematous, small effusion palpable, tender to palpation along the joint line   Neuro:  Grossly intact  Data Reviewed: Basic Metabolic Panel:  Recent Labs Lab 01/03/16 1936 01/04/16 0501 01/05/16 0518  NA 140 136 139  K 4.3 4.4 4.4  CL 108 104 105  CO2 21* 20* 24  GLUCOSE 100* 148* 102*  BUN 24* 22* 28*  CREATININE 1.76* 1.30* 1.25*  CALCIUM 10.0 9.8 9.5   Liver Function Tests: No results for input(s): AST, ALT, ALKPHOS, BILITOT, PROT, ALBUMIN in the last 168 hours. No results for input(s): LIPASE, AMYLASE in the last 168 hours. No results for input(s): AMMONIA in the last 168 hours. CBC:  Recent Labs Lab 01/03/16 1936 01/04/16 0501 01/05/16 0518  WBC 11.5* 9.1 14.1*  HGB 11.8* 10.9* 11.5*  HCT 38.5 36.7 38.2  MCV 72.6* 73.5* 74.0*  PLT 438* 441* 444*    No results found for this or any previous visit (from the past 240 hour(s)).   Studies: Dg Chest 2 View  01/03/2016  CLINICAL DATA:  65 year old female with chest pain and shortness of breath and productive cough. EXAM: CHEST  2  VIEW COMPARISON:  Radiograph dated 12/13/2015 FINDINGS: Two views of the chest demonstrates bibasilar linear and hazy interstitial densities corresponding to the interstitial lung disease described on the prior radiograph and CT. There is slight vascular prominence compared to prior study which may represent mild congestive changes. Superimposed pneumonia is less likely but not excluded. Clinical correlation is recommended. There is no focal consolidation, pleural effusion, or pneumothorax. Stable cardiac silhouette. A no acute osseous pathology. IMPRESSION: Bibasilar interstitial densities with mild increased vascular prominence may represent mild superimposed edema on known interstitial lung disease. No focal consolidation. Electronically Signed   By: Elgie Collard M.D.   On: 01/03/2016 20:24   Dg Knee 1-2 Views Right  01/04/2016  CLINICAL DATA:  Patient with diffuse right knee pain.  No injury. EXAM: RIGHT KNEE - 1-2 VIEW COMPARISON:  MRI right knee 12/09/2014 FINDINGS: Normal anatomic alignment. No evidence for acute fracture or dislocation. Tricompartmental  osteophytosis. Medial compartment joint space narrowing. No joint effusion. Nonspecific calcific density about the anterior and medial tibial plateau. IMPRESSION: No acute osseous abnormality. Tricompartmental osteoarthritis. Electronically Signed   By: Annia Belt M.D.   On: 01/04/2016 11:13    Scheduled Meds: . amiodarone  200 mg Oral Daily  . amLODipine  10 mg Oral Daily  . calcium-vitamin D  1 tablet Oral Daily  . diclofenac sodium  4 g Topical QID  . enoxaparin (LOVENOX) injection  40 mg Subcutaneous QHS  . furosemide  40 mg Intravenous BID  . guaiFENesin  600 mg Oral BID  . levalbuterol  0.63 mg Nebulization BID  . montelukast  10 mg Oral QPM  . pantoprazole  40 mg Oral Daily  . potassium chloride SA  20 mEq Oral BID  . pravastatin  20 mg Oral Daily  . sodium chloride flush  3 mL Intravenous Q12H  . cyanocobalamin  2,000 mcg Oral  Daily   Continuous Infusions:   Principal Problem:   Acute on chronic respiratory failure with hypoxia (HCC) Active Problems:   Cigarette smoker   Essential hypertension   Acute on chronic diastolic CHF (congestive heart failure) (HCC)   PAF (paroxysmal atrial fibrillation) (HCC)   Osteoarthritis of both knees    Time spent: 30 min    Wali Reinheimer  [facility] Pager [phone]. If 7PM-7AM, please contact night-coverage at [email or web address], password [email or web address] 01/05/2016, 7:28 AM  LOS: 2 days

## 2016-01-06 DIAGNOSIS — T462X5A Adverse effect of other antidysrhythmic drugs, initial encounter: Secondary | ICD-10-CM

## 2016-01-06 DIAGNOSIS — J984 Other disorders of lung: Secondary | ICD-10-CM

## 2016-01-06 LAB — CBC
HEMATOCRIT: 39.8 % (ref 36.0–46.0)
HEMOGLOBIN: 12.1 g/dL (ref 12.0–15.0)
MCH: 22.2 pg — AB (ref 26.0–34.0)
MCHC: 30.4 g/dL (ref 30.0–36.0)
MCV: 73 fL — AB (ref 78.0–100.0)
Platelets: 476 10*3/uL — ABNORMAL HIGH (ref 150–400)
RBC: 5.45 MIL/uL — ABNORMAL HIGH (ref 3.87–5.11)
RDW: 17.2 % — ABNORMAL HIGH (ref 11.5–15.5)
WBC: 12.1 10*3/uL — ABNORMAL HIGH (ref 4.0–10.5)

## 2016-01-06 LAB — BASIC METABOLIC PANEL
ANION GAP: 10 (ref 5–15)
BUN: 29 mg/dL — ABNORMAL HIGH (ref 6–20)
CALCIUM: 9.4 mg/dL (ref 8.9–10.3)
CHLORIDE: 104 mmol/L (ref 101–111)
CO2: 24 mmol/L (ref 22–32)
Creatinine, Ser: 1.38 mg/dL — ABNORMAL HIGH (ref 0.44–1.00)
GFR calc Af Amer: 45 mL/min — ABNORMAL LOW (ref >60)
GFR calc non Af Amer: 39 mL/min — ABNORMAL LOW (ref >60)
GLUCOSE: 98 mg/dL (ref 65–99)
Potassium: 4.3 mmol/L (ref 3.5–5.1)
Sodium: 138 mmol/L (ref 135–145)

## 2016-01-06 MED ORDER — FUROSEMIDE 40 MG PO TABS: 40.0000 mg | ORAL_TABLET | Freq: Every day | ORAL | Status: AC

## 2016-01-06 NOTE — Care Management Important Message (Signed)
Important Message  Patient Details  Name: HEER JUSTISS MRN: [id number] Date of Birth: 14-Apr-1951   Medicare Important Message Given:  Yes    Elliot Cousin, RN 01/06/2016, 4:36 PM

## 2016-01-06 NOTE — Discharge Summary (Signed)
Physician Discharge Summary  [name] BJY:[id number] DOB: [birth year] DOA: 01/03/2016  PCP: [name], MD  Admit date: 01/03/2016 Discharge date: 01/06/2016  Recommendations for Outpatient Follow-up:  1. Follow-up at already scheduled cardiology appointment in 2 days to address amiodarone 2. Increase Lasix from 20 mg to 40 mg daily 3. Patient to schedule pulmonary appointment within 1-2 weeks of discharge 4. BMP and CBC in approximately 1-2 weeks to check kitty function, potassium, white blood cell count  Discharge Diagnoses:  Principal Problem:   Acute on chronic diastolic CHF (congestive heart failure) (HCC) Active Problems:   Cigarette smoker   Essential hypertension   Acute on chronic respiratory failure with hypoxia (HCC)   PAF (paroxysmal atrial fibrillation) (HCC)   Osteoarthritis of both knees   Amiodarone pulmonary toxicity (HCC)   Discharge Condition: Stable, improved  Diet recommendation: Low-sodium  Wt Readings from Last 3 Encounters:  01/06/16 91.763 kg (202 lb 4.8 oz)  12/13/15 94.348 kg (208 lb)  10/08/15 91.128 kg (200 lb 14.4 oz)    History of present illness:  The patient is a 65 year old female with history of tobacco abuse, paroxysmal atrial fibrillation on amiodarone with suspected amiodarone toxicity and followed by pulmonology, hypertension, hyperlipidemia, chronic kidney disease stage III who presented with progressive shortness of breath. She had a cough productive of clear sputum, increased lower extremity edema. She denied fevers, chills. In the emergency department, she was noted to be hypoxic to the mid 80s on room air and recovered on 3 L nasal cannula. Her chest x-ray demonstrated by basilar interstitial densities with mild increased vascular prominence. She was given Lasix and started on treatment for possible acute COPD exacerbation with antibiotics, steroids, and nebulizer treatments.  Hospital Course:   Acute on chronic respiratory  failure with hypoxia. I suspect that she has underlying amiodarone pulmonary toxicity or some other interstitial lung disease and had some superimposed acute on chronic diastolic heart failure which caused her to become progressively Kymorah Korf of breath. Also, although she had an appointment scheduled with cardiology should continue to take her amiodarone area did her amiodarone was started in January 2015 secondary to refractory atrial fibrillation and she had been in normal sinus rhythm ever since. She remained afebrile and although she developed a leukocytosis during hospitalization and believe that this was related to the steroids she received at admission. Her pro-calcitonin was negative. I discontinued her antibiotics. She has no known history of asthma or COPD although she is a smoker. She was not wheezing and she had improvement with diuresis. I discontinued her other treatments for COPD exacerbation. The case was discussed with pulmonology who agreed with stopping her amiodarone and increasing her diuretic. She will follow-up with cardiology in a couple of days. I have low suspicion that she will revert to atrial fibrillation before her follow-up visit because of the long half-life of amiodarone and she will also have close follow-up with pulmonology in case she has minimal improvement after these interventions.  Clinically, she had resolution of her lower extremity edema and improvement in her rales. She felt markedly improved in terms of her swelling and orthopnea prior to discharge. She still required 2 L nasal cannula to maintain oxygen saturations greater than 88% with ambulation at the time of discharge. Case management set her up with home oxygen. Hopefully, this can eventually be discontinued as she recovers from this illness.  Acute kidney injury on chronic kidney disease, likely secondary to cardiorenal syndrome and improved with diuresis.  Creatinine trended down  from 1.76 to 1.38 at the time of  discharge.  Paroxysmal atrial fibrillation: Patient appear to be in sinus rhythm on admission patient notes a previous history of being on Coumadin previously in the past but reported bleeding complications. chadvasc score 4.  Symmetry given straight a normal sinus rhythm. Her amiodarone was discontinued due to concern for amiodarone pulmonary toxicity. She will have follow-up with cardiology and 2 days at an already scheduled appointment.  Essential hypertension, stable, continued amlodipine.  Osteoarthritis of the knees: Chronic but with severe pain.  She should continue her home medications and follow-up with her orthopedic doctor and her pain clinic.  Tobacco abuse: Patient reports still smoking.  Counseled on the need of smoking cessation  Leukocytosis, likely due to steroids and trended down spontaneously  Consultants:  None  Procedures:  Chest x-ray  Antibiotics:  Ceftriaxone and doxycycline x 1 dose  Discharge Exam: Filed Vitals:   01/05/16 2018 01/06/16 0552  BP: 124/49 124/63  Pulse: 52 60  Temp: 98.2 F (36.8 C) 98 F (36.7 C)  Resp: 18 18   Filed Vitals:   01/05/16 1227 01/05/16 1500 01/05/16 2018 01/06/16 0552  BP:  122/57 124/49 124/63  Pulse:  67 52 60  Temp:  98.5 F (36.9 C) 98.2 F (36.8 C) 98 F (36.7 C)  TempSrc:  Oral Oral Oral  Resp:  18 18 18   Height:      Weight: 91.354 kg (201 lb 6.4 oz)   91.763 kg (202 lb 4.8 oz)  SpO2:  94% 95% 94%     General: Adult female, No acute distress  Cardiovascular: RRR, nl S1, S2 no mrg, 2+ pulses, warm extremities  Respiratory: Rales particularly over the right mid-back but also at the bilateral bases, no rhonchi or wheeze, increased WOB  Abdomen: NABS, soft, NT/ND  MSK: Normal tone and bulk, no residual LEE, right knee is mildly erythematous, small effusion palpable, tender to palpation along the joint line   Discharge Instructions      Discharge Instructions    (HEART FAILURE  PATIENTS) Call MD:  Anytime you have any of the following symptoms: 1) 3 pound weight gain in 24 hours or 5 pounds in 1 week 2) shortness of breath, with or without a dry hacking cough 3) swelling in the hands, feet or stomach 4) if you have to sleep on extra pillows at night in order to breathe.    Complete by:  As directed      Call MD for:  difficulty breathing, headache or visual disturbances    Complete by:  As directed      Call MD for:  extreme fatigue    Complete by:  As directed      Call MD for:  hives    Complete by:  As directed      Call MD for:  persistant dizziness or light-headedness    Complete by:  As directed      Call MD for:  persistant nausea and vomiting    Complete by:  As directed      Call MD for:  severe uncontrolled pain    Complete by:  As directed      Call MD for:  temperature >100.4    Complete by:  As directed      Diet - low sodium heart healthy    Complete by:  As directed      Discharge instructions    Complete by:  As directed   Please  stop your amiodarone.  Increase your lasix to  once daily.  Weigh yourself every day and if you gain more than 3-lbs in one day or 5-lbs in one week, please call the cardiology office.     Increase activity slowly    Complete by:  As directed             Medication List    STOP taking these medications        amiodarone 200 MG tablet  Commonly known as:  PACERONE      TAKE these medications        albuterol 108 (90 Base) MCG/ACT inhaler  Commonly known as:  PROVENTIL HFA;VENTOLIN HFA  Inhale 1-2 puffs into the lungs every 6 (six) hours as needed for wheezing or shortness of breath.     albuterol (2.5 MG/3ML) 0.083% nebulizer solution  Commonly known as:  PROVENTIL  Take 2.5 mg by nebulization every 6 (six) hours as needed for wheezing or shortness of breath.     amLODipine 10 MG tablet  Commonly known as:  NORVASC  Take 10 mg by mouth daily.     CALCIUM + D3 PO  Take 1 tablet by mouth daily.      cyanocobalamin 2000 MCG tablet  Take 2,000 mcg by mouth daily.     furosemide 40 MG tablet  Commonly known as:  LASIX  Take 1 tablet (40 mg total) by mouth daily.     levalbuterol 0.63 MG/3ML nebulizer solution  Commonly known as:  XOPENEX  Take 3 mLs (0.63 mg total) by nebulization every 6 (six) hours as needed for wheezing or shortness of breath.     montelukast 10 MG tablet  Commonly known as:  SINGULAIR  Take 10 mg by mouth every evening.     oxyCODONE-acetaminophen 7.5-325 MG tablet  Commonly known as:  PERCOCET  Take 1 tablet by mouth every 6 (six) hours.     pantoprazole 40 MG tablet  Commonly known as:  PROTONIX  Take 40 mg by mouth daily.     potassium chloride SA 20 MEQ tablet  Commonly known as:  K-DUR,KLOR-CON  Take 1 tablet (20 mEq total) by mouth 2 (two) times daily.     pravastatin 20 MG tablet  Commonly known as:  PRAVACHOL  Take 20 mg by mouth daily.       Follow-up Information    Follow up with OSEI-BONSU,GEORGE, MD. Schedule an appointment as soon as possible for a visit in 2 weeks.   Specialty:  Internal Medicine   Contact information:   [address] [place] [address] [phone]       Follow up with [facility]. Schedule an appointment as soon as possible for a visit in 2 weeks.   Specialty:  Pulmonology   Contact information:   [address] [place] [address] [phone]      Follow up with [facility]. Go on 01/08/2016.   Why:  for instructions regarding amiodarone and your atrial fibrillation   Contact information:   [address] [place] [address] [phone]       The results of significant diagnostics from this hospitalization (including imaging, microbiology, ancillary and laboratory) are listed below for reference.    Significant Diagnostic Studies: Dg Chest 2 View  01/03/2016  CLINICAL DATA:   65 year old female with chest pain and shortness of breath and productive cough. EXAM: CHEST  2 VIEW  COMPARISON:  Radiograph dated 12/13/2015 FINDINGS: Two views of the chest demonstrates bibasilar linear and hazy interstitial densities corresponding to the interstitial lung disease described on the prior radiograph and CT. There is slight vascular prominence compared to prior study which may represent mild congestive changes. Superimposed pneumonia is less likely but not excluded. Clinical correlation is recommended. There is no focal consolidation, pleural effusion, or pneumothorax. Stable cardiac silhouette. A no acute osseous pathology. IMPRESSION: Bibasilar interstitial densities with mild increased vascular prominence may represent mild superimposed edema on known interstitial lung disease. No focal consolidation. Electronically Signed   By: Elgie Collard M.D.   On: 01/03/2016 20:24   Dg Chest 2 View  12/13/2015  CLINICAL DATA:  Dyspnea.  Cough on amiodarone EXAM: CHEST  2 VIEW COMPARISON:  10/24/2015 FINDINGS: Interstitial lung disease with basilar predominant fine interstitial opacities and changes of bronchiectasis. No signs of honeycombing. No superimposed pneumonia or edema. No effusion or pneumothorax. Chronic prominence of the main pulmonary artery, likely pulmonary hypertension. IMPRESSION: Interstitial lung disease without convincing progression since 2016. Electronically Signed   By: Marnee Spring M.D.   On: 12/13/2015 15:31   Dg Knee 1-2 Views Right  01/04/2016  CLINICAL DATA:  Patient with diffuse right knee pain.  No injury. EXAM: RIGHT KNEE - 1-2 VIEW COMPARISON:  MRI right knee 12/09/2014 FINDINGS: Normal anatomic alignment. No evidence for acute fracture or dislocation. Tricompartmental osteophytosis. Medial compartment joint space narrowing. No joint effusion. Nonspecific calcific density about the anterior and medial tibial plateau. IMPRESSION: No acute osseous abnormality.  Tricompartmental osteoarthritis. Electronically Signed   By: Annia Belt M.D.   On: 01/04/2016 11:13   Dg Chest Port 1 View  01/05/2016  CLINICAL DATA:  Productive cough, shortness of breath on exertion. History of COPD. EXAM: PORTABLE CHEST 1 VIEW COMPARISON:  Chest x-rays dated 01/03/2016 and 12/13/2015. FINDINGS: Coarse interstitial markings are again seen throughout both lungs, stable compared to multiple prior exams, compatible with chronic interstitial lung disease as described on interval chest CT of 10/07/2015. Perhaps mild interstitial edema superimposed on chronic fibrosis. Mild cardiomegaly is stable. No pleural effusion seen. No pneumothorax seen. Osseous and soft tissue structures about the chest are unremarkable. IMPRESSION: Stable chest x-ray. Coarse interstitial markings throughout both lungs, compatible with chronic interstitial lung disease, stable or mildly progressing compared to older exams. Perhaps a chronic mild interstitial edema superimposed on chronic fibrosis. Mild cardiomegaly, stable. Electronically Signed   By: Bary Richard M.D.   On: 01/05/2016 12:57    Microbiology: No results found for this or any previous visit (from the past 240 hour(s)).   Labs: Basic Metabolic Panel:  Recent Labs Lab 01/03/16 1936 01/04/16 0501 01/05/16 0518 01/06/16 0531  NA 140 136 139 138  K 4.3 4.4 4.4 4.3  CL 108 104 105 104  CO2 21* 20* 24 24  GLUCOSE 100* 148* 102* 98  BUN 24* 22* 28* 29*  CREATININE 1.76* 1.30* 1.25* 1.38*  CALCIUM 10.0 9.8 9.5 9.4   Liver Function Tests: No results for input(s): AST, ALT, ALKPHOS, BILITOT, PROT, ALBUMIN in the last 168 hours. No results for input(s): LIPASE, AMYLASE in the last 168 hours. No results for input(s): AMMONIA in the last 168 hours. CBC:  Recent Labs Lab 01/03/16 1936 01/04/16 0501 01/05/16 0518 01/06/16 0531  WBC 11.5* 9.1 14.1* 12.1*  HGB 11.8* 10.9* 11.5* 12.1  HCT 38.5 36.7 38.2 39.8  MCV 72.6* 73.5* 74.0* 73.0*   PLT 438* 441* 444* 476*  Cardiac Enzymes:  Recent Labs Lab 01/03/16 2350 01/04/16 0501  TROPONINI <0.03 <0.03   BNP: BNP (last 3 results)  Recent Labs  10/07/15 1013 10/24/15 1557 01/03/16 1945  BNP 46.9 75.7 156.0*    ProBNP (last 3 results)  Recent Labs  12/19/15 1225  PROBNP 76.0    CBG: No results for input(s): GLUCAP in the last 168 hours.  Time coordinating discharge: 35 minutes  Signed:  Lisseth Brazeau  [facility] 01/06/2016, 11:33 AM

## 2016-01-06 NOTE — Progress Notes (Signed)
SATURATION QUALIFICATIONS: (This note is used to comply with regulatory documentation for home oxygen)  Patient Saturations on Room Air at Rest = 91%  Patient Saturations on Room Air while Ambulating = 84%  Patient Saturations on 2 Liters of oxygen while Ambulating = 94%  Please briefly explain why patient needs home oxygen: 

## 2016-01-06 NOTE — Care Management Note (Signed)
Case Management Note  Patient Details  Name: [name] MRN: [id number] Date of Birth: 08/06/1951  Subjective/Objective:   Acute on chronic diastolic CHF                  Action/Plan: 01/06/2016 1200 NCM spoke to pt at bedside. Explained [employer] will deliver a portable tank to room and her concentrator to her home. Provided [employer] contact info. Contacted [employer] with new referral for oxygen. Spoke to [place] DME rep. Pt states she and her brother smoke in the home. She plans to quit but was concerned that her brother will continue to smoke. States she is independent in the home and brother at home to assist with care as needed. She has neb machine at home. Unit RN will provide pt with smoking cessation info at dc.   Expected Discharge Date:  01/06/2016               Expected Discharge Plan:  Home/Self Care  In-House Referral:  NA  Discharge planning Services  CM Consult  Post Acute Care Choice:  NA Choice offered to:  NA  DME Arranged:  Oxygen DME Agency:  [facility]  HH Arranged:  NA HH Agency:  NA  Status of Service:  Completed, signed off  Medicare Important Message Given:  yes Date Medicare IM Given:    Medicare IM give by:    Date Additional Medicare IM Given:    Additional Medicare Important Message give by:     If discussed at Long Length of Stay Meetings, dates discussed:    Additional Comments:  Elliot Cousin, RN 01/06/2016, 4:33 PM

## 2016-01-07 ENCOUNTER — Telehealth: Payer: Self-pay | Admitting: Internal Medicine

## 2016-01-07 NOTE — Telephone Encounter (Signed)
Per hospital d/c 2/12: Follow up with [facility]. Schedule an appointment as soon as possible for a visit in 2 weeks. --  Called spoke with pt. She requested appt this week to see MW. She scheduled HFU with MW tomorrow at 3:30. Nothing further needed

## 2016-01-08 ENCOUNTER — Ambulatory Visit (INDEPENDENT_AMBULATORY_CARE_PROVIDER_SITE_OTHER): Payer: Medicare Other | Admitting: Adult Health

## 2016-01-08 ENCOUNTER — Inpatient Hospital Stay: Payer: Medicare Other | Admitting: Internal Medicine

## 2016-01-08 ENCOUNTER — Ambulatory Visit (INDEPENDENT_AMBULATORY_CARE_PROVIDER_SITE_OTHER): Payer: Medicare Other | Admitting: Internal Medicine

## 2016-01-08 ENCOUNTER — Encounter: Payer: Self-pay | Admitting: Internal Medicine

## 2016-01-08 ENCOUNTER — Encounter: Payer: Self-pay | Admitting: Adult Health

## 2016-01-08 ENCOUNTER — Telehealth: Payer: Self-pay | Admitting: Internal Medicine

## 2016-01-08 VITALS — BP 130/58 | HR 48 | Ht 66.0 in | Wt 208.0 lb

## 2016-01-08 VITALS — BP 130/60 | HR 56 | Temp 98.2°F | Ht 66.0 in | Wt 209.0 lb

## 2016-01-08 DIAGNOSIS — Z720 Tobacco use: Secondary | ICD-10-CM

## 2016-01-08 DIAGNOSIS — I480 Paroxysmal atrial fibrillation: Secondary | ICD-10-CM

## 2016-01-08 DIAGNOSIS — I5033 Acute on chronic diastolic (congestive) heart failure: Secondary | ICD-10-CM | POA: Diagnosis not present

## 2016-01-08 DIAGNOSIS — N183 Chronic kidney disease, stage 3 unspecified: Secondary | ICD-10-CM

## 2016-01-08 DIAGNOSIS — I10 Essential (primary) hypertension: Secondary | ICD-10-CM | POA: Diagnosis not present

## 2016-01-08 DIAGNOSIS — T462X5A Adverse effect of other antidysrhythmic drugs, initial encounter: Secondary | ICD-10-CM

## 2016-01-08 DIAGNOSIS — J984 Other disorders of lung: Secondary | ICD-10-CM | POA: Diagnosis not present

## 2016-01-08 DIAGNOSIS — F17210 Nicotine dependence, cigarettes, uncomplicated: Secondary | ICD-10-CM

## 2016-01-08 DIAGNOSIS — J9621 Acute and chronic respiratory failure with hypoxia: Secondary | ICD-10-CM | POA: Diagnosis not present

## 2016-01-08 DIAGNOSIS — J8410 Pulmonary fibrosis, unspecified: Secondary | ICD-10-CM

## 2016-01-08 MED ORDER — RIVAROXABAN 20 MG PO TABS
20.0000 mg | ORAL_TABLET | Freq: Every day | ORAL | Status: DC
Start: 2016-01-08 — End: 2016-01-08

## 2016-01-08 MED ORDER — RIVAROXABAN 15 MG PO TABS: 15.0000 mg | ORAL_TABLET | Freq: Every day | ORAL | Status: DC

## 2016-01-08 NOTE — Assessment & Plan Note (Signed)
?   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

## 2016-01-08 NOTE — Telephone Encounter (Signed)
He wants to know what prescription is right Xarelto 15 mg or Xarelto 20 mg?

## 2016-01-08 NOTE — Patient Instructions (Addendum)
Continue on current regimen .  Follow up with Dr. Sherene Sires  In 4-6 weeks with PFT and chest xray  Please contact office for sooner follow up if symptoms do not improve or worsen or seek emergency care  Work on not smoking

## 2016-01-08 NOTE — Assessment & Plan Note (Signed)
Recent flare now improved with diuresis .

## 2016-01-08 NOTE — Assessment & Plan Note (Signed)
Improved with diuresis and O2  Cont w/ o2 .

## 2016-01-08 NOTE — Assessment & Plan Note (Signed)
Smoking cessation  

## 2016-01-08 NOTE — Patient Instructions (Addendum)
Medication Instructions:   START xarelto 15mg  once daily - 1 bottle samples (#14) and free 30 day card given to patient   Follow-Up:  Your physician recommends that you schedule a follow-up appointment in: 3 months with Dr. .

## 2016-01-08 NOTE — Telephone Encounter (Signed)
Dose clarified based on Dr. Blanchie Dessert recommendations.

## 2016-01-08 NOTE — Progress Notes (Signed)
Subjective:    Patient ID: [name], female    DOB: 1950/12/20,     MRN: [id number]  HPI  36 yobf active smoker with new onset doe x winter of 2016 while on amiodarone per [place] referred by Dr Vista Lawman to pulmonary clinic 12/13/2015 sp admit:  Admit date: 10/07/2015 Discharge date: 10/08/2015  Recommendations for Outpatient Follow-up:  1. Pt will need to follow up with PCP in 2-3 weeks post discharge 2. Please obtain BMP to evaluate electrolytes and kidney function 3. Please also check CBC to evaluate Hg and Hct levels 4. Stop Losartan until renal function stabilizes  5. Continue Zithromax to complete therapy 6. Continue prednisone taper  Discharge Diagnoses:  Principal Problem:  Acute respiratory failure (HCC) Active Problems:  Acute on chronic diastolic CHF (congestive heart failure) (HCC)  Schizophrenia (HCC)  HCAP (healthcare-associated pneumonia)  TOBACCO USER  Atrial fibrillation (HCC)  Leukocytosis  CKD (chronic kidney disease) stage 3, GFR 30-59 ml/min  Essential hypertension  Discharge Condition: Stable  Diet recommendation: Heart healthy diet discussed in details   HPI: Pt is 65 yo female who presented to [facility] ED with main concern of 1-2 days duration of progressively worsening dyspnea associate with mix episodes of non productive and productive cough of clear sputum, associated with left sided chest discomfort that is present with coughing spells, subjective fever, chills. Pt reports she has just came back from [place] yesterday. She was recently hospitalized for PNA in September this year. Pt denies abd or urinary concerns.   In ED, pt noted to be hemodynamically stable, VSS, blood work notable for WBC 16 K, imaging studies worrisome for ? Developing PNA. TRH asked to admit for further evaluation.   Assessment and Plan:  Principal Problem:  Acute respiratory failure ([place]) - secondary to acute on chronic diastolic CHF and HCAP -  Lasix upon discharge, ABX for pneumonia - also continue Prednisone taper   Active Problems:  Acute on chronic diastolic CHF (congestive heart failure) ([place]) - last 2 D ECHO 11/6943 wit diastolic grade I CHF and normal EF 60% - continue with Lasix  - weight 93 kg on admission --> 91 kg    Schizophrenia ([place]) - was agitated on ED initially but now more stable and calm   HCAP (healthcare-associated pneumonia) - pneumonia order set in place - follow up on sputum culture, urine legionella and strep penumo - continue ABX    Hypokalemia - supplemented prior discharges    TOBACCO USER - consultation cessation provided    Atrial fibrillation ([place]), CHADS 2 score ~3 - on amiodarone at home, not on [facility] and pt unable to tell why  - pt currently in sinus rhythm    Leukocytosis - secondary to HCAP - ABX as noted above    CKD (chronic kidney disease) stage 3, GFR 30-59 ml/min - at baseline Cr 1.3 - 1.5   Essential hypertension - reasonably stable so far   12/13/2015 1st [place] [facility] office visit/ [facility]   Chief Complaint  Patient presents with  . Pulmonary Consult    Referred by Dr. Vista Lawman.  Pt c/o DOE for the past yr. She gets SOB walking up and down stairs and approx 1 block on a flat surface. She also c/o cough with clear sputum.    cough comes and goes s pattern day and some worse at hs, sob x one flight or one block nl pace, onset was insidious/ pattern is slowly progressive - ? Transiently better  with pred/ neb alb  >>no changes   01/08/2016 Post Hospital follow up  Patient returns for a post hospital follow-up. Patient was recently readmitted to the hospital February 9 to February 12 for acute on chronic diastolic heart failure decompensation. 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 She is followed by cardiology for Atrial  fibrillation and was on amiodarone since 2015. Amiodarone was stopped and she improved with aggessive diuresis.   Since discharge she is  feeling better with less cough and dyspnea.  Still smoking, has cut back on cigs . Encouraged cessation .  Now on O2 2l/m .  ESR 33 on 12/19/15  No previous PFT on record.   Seen by Cardiology this am , started on Xarelto .   Current Medications, Allergies, Complete Past Medical History, Past Surgical History, Family History, and Social History were reviewed in [employer] record.   Review of Systems  Constitutional:   No  weight loss, night sweats,  Fevers, chills, fatigue, or  lassitude.  HEENT:   No headaches,  Difficulty swallowing,  Tooth/dental problems, or  Sore throat,                No sneezing, itching, ear ache, nasal congestion, post nasal drip,   CV:  No chest pain,  Orthopnea, PND, swelling in lower extremities, anasarca, dizziness, palpitations, syncope.   GI  No heartburn, indigestion, abdominal pain, nausea, vomiting, diarrhea, change in bowel habits, loss of appetite, bloody stools.   Resp: No shortness of breath with exertion or at rest.  No excess mucus, no productive cough,  No non-productive cough,  No coughing up of blood.  No change in color of mucus.  No wheezing.  No chest wall deformity  Skin: no rash or lesions.  GU: no dysuria, change in color of urine, no urgency or frequency.  No flank pain, no hematuria   MS:  No joint pain or swelling.  No decreased range of motion.  No back pain.  Psych:  No change in mood or affect. No depression or anxiety.  No memory loss.         Objective:   Physical Exam  amb bf nad   Vital signs reviewed   HEENT: nl dentition, turbinates, and oropharynx. Nl external ear canals without cough reflex   NECK :  without JVD/Nodes/TM/ nl carotid upstrokes bilaterally   LUNGS: no acc muscle use,  Nl contour chest a few insp and exp rhonchi upper > lower s crackles    CV:  RRR  no s3 or murmur or increase in P2, trace bilateral lower ext  Pitting  edema   ABD:  soft and nontender with nl  inspiratory excursion in the supine position. No bruits or organomegaly, bowel sounds nl  MS:  Nl gait/ ext warm without deformities, calf tenderness, cyanosis or clubbing No obvious joint restrictions   SKIN: warm and dry without lesions    NEURO:  alert, approp, nl sensorium with  no motor deficits     01/05/16 CXR PA and Lateral:  Stable chest x-ray. Coarse interstitial markings throughout both lungs, compatible with chronic interstitial lung disease, stable or mildly progressing compared to older exams. Perhaps a chronic mild interstitial edema superimposed on chronic fibrosis.      Assessment & Plan:

## 2016-01-09 NOTE — Progress Notes (Signed)
OFFICE NOTE  Chief Complaint:  Re-establish cardiologist  Primary Care Physician: [name], MD  HPI:  [name] is a pleasant 65 year old female who I saw remotely in 2012. Subsequently she followed up with another cardiologist in town, Dr. Jacinto Halim in 2015. She presents today's a new patient having not been seen in the last 5 years by myself. [name] was previously seen in the hospital for acute renal failure, nausea, vomiting, anemia and hypokalemia. At the time she had atrial fibrillation and flutter and she was previously on warfarin however that was stopped due to significant GI bleeding. She was started by my partners on amiodarone for rhythm control as well as metoprolol. I had scheduled her for pulmonary function, liver and thyroid testing. This was in April 2012. She had a recurrent visit scheduled for 07/30/2011 and did not show for that visit. Was lost to follow-up to our office. According to hospital records she was seen in 2015 where she had recurrent atrial fibrillation. She was not on amiodarone at the time. Dr. Jacinto Halim treated her and started her on amiodarone. He did not feel that she was a candidate for warfarin based on her history of GI bleeding. Recently she's had some progressive shortness of breath and was seen by Dr. Sherene Sires and found to have pulmonary fibrosis, suspected to be related to amiodarone. She has maintained sinus rhythm. Her CHADSVASC score is 4. She has been on oxygen therapy for her already fibrosis. She denies any chest pain.  PMHx:  Past Medical History  Diagnosis Date  . Hypertension   . Borderline diabetes   . Irregular heart beat   . Hyperlipidemia   . CHF (congestive heart failure) (HCC)   . CKD (chronic kidney disease) stage 3, GFR 30-59 ml/min   . Dysrhythmia     A-fib  . Anxiety   . Depression   . Shortness of breath     on exertion  . GERD (gastroesophageal reflux disease)   . Headache(784.0)   . Arthritis     knees  .  Anemia     Past Surgical History  Procedure Laterality Date  . Hemorrhoid surgery    . Esophagogastroduodenoscopy N/A 02/25/2013    Procedure: ESOPHAGOGASTRODUODENOSCOPY (EGD);  Surgeon: Theda Belfast, MD;  Location: [facility];  Service: Endoscopy;  Laterality: N/A;  . Colonoscopy N/A 02/25/2013    Procedure: COLONOSCOPY;  Surgeon: Theda Belfast, MD;  Location: [facility];  Service: Endoscopy;  Laterality: N/A;  . Givens capsule study N/A 02/26/2013    Procedure: GIVENS CAPSULE STUDY;  Surgeon: Theda Belfast, MD;  Location: [facility];  Service: Endoscopy;  Laterality: N/A;  . Toe surgery  05/31/2015    FAMHx:  Family History  Problem Relation Age of Onset  . Kidney disease Mother   . Heart attack Mother   . Kidney disease Father   . Hypertension Mother   . Breast cancer Daughter   . Heart disease Brother   . Diabetes Brother   . Lung cancer Brother   . Hypertension Sister   . Hypertension Daughter     SOCHx:   reports that she has been smoking Cigarettes.  She has a 11.25 pack-year smoking history. She uses smokeless tobacco. She reports that she uses illicit drugs (Marijuana). She reports that she does not drink alcohol.  ALLERGIES:  Allergies  Allergen Reactions  . Levaquin [Levofloxacin] Hives  . Penicillins Swelling    Has patient had a PCN reaction causing immediate rash,  facial/tongue/throat swelling, SOB or lightheadedness with hypotension yes Has patient had a PCN reaction causing severe rash involving mucus membranes or skin necrosis: unknown Has patient had a PCN reaction that required hospitalization yes Has patient had a PCN reaction occurring within the last 10 years: no If all of the above answers are "NO", then may proceed with Cephalosporin use.     ROS: Pertinent items noted in HPI and remainder of comprehensive ROS otherwise negative.  HOME MEDS: Current Outpatient Prescriptions  Medication Sig Dispense Refill  . albuterol (PROVENTIL  HFA;VENTOLIN HFA) 108 (90 BASE) MCG/ACT inhaler Inhale 1-2 puffs into the lungs every 6 (six) hours as needed for wheezing or shortness of breath.    Marland Kitchen albuterol (PROVENTIL) (2.5 MG/3ML) 0.083% nebulizer solution Take 2.5 mg by nebulization every 6 (six) hours as needed for wheezing or shortness of breath.    Marland Kitchen amLODipine (NORVASC) 10 MG tablet Take 10 mg by mouth daily.    . Calcium Carb-Cholecalciferol (CALCIUM + D3 PO) Take 1 tablet by mouth daily.     . cyanocobalamin 2000 MCG tablet Take 2,000 mcg by mouth daily.    . furosemide (LASIX) 40 MG tablet Take 1 tablet (40 mg total) by mouth daily. 30 tablet 0  . levalbuterol (XOPENEX) 0.63 MG/3ML nebulizer solution Take 3 mLs (0.63 mg total) by nebulization every 6 (six) hours as needed for wheezing or shortness of breath. 500 mL 1  . montelukast (SINGULAIR) 10 MG tablet Take 10 mg by mouth every evening.     Marland Kitchen oxyCODONE-acetaminophen (PERCOCET) 7.5-325 MG tablet Take 1 tablet by mouth every 6 (six) hours.  0  . pantoprazole (PROTONIX) 40 MG tablet Take 40 mg by mouth daily.     . potassium chloride SA (K-DUR,KLOR-CON) 20 MEQ tablet Take 1 tablet (20 mEq total) by mouth 2 (two) times daily. 60 tablet 0  . pravastatin (PRAVACHOL) 20 MG tablet Take 20 mg by mouth daily.    . Rivaroxaban (XARELTO) 15 MG TABS tablet Take 1 tablet (15 mg total) by mouth daily with supper. 30 tablet 6   No current facility-administered medications for this visit.    LABS/IMAGING: No results found for this or any previous visit (from the past 48 hour(s)). No results found.  WEIGHTS: Wt Readings from Last 3 Encounters:  01/08/16 209 lb (94.802 kg)  01/08/16 208 lb (94.348 kg)  01/06/16 202 lb 4.8 oz (91.763 kg)    VITALS: BP 130/58 mmHg  Pulse 48  Ht 5\' 6"  (1.676 m)  Wt 208 lb (94.348 kg)  BMI 33.59 kg/m2  EXAM: General appearance: alert and no distress Neck: no carotid bruit and no JVD Lungs: diminished breath sounds bilaterally and rales  bilaterally Heart: regular rate and rhythm, S1, S2 normal, no murmur, click, rub or gallop Abdomen: soft, non-tender; bowel sounds normal; no masses,  no organomegaly Extremities: extremities normal, atraumatic, no cyanosis or edema Pulses: 2+ and symmetric Skin: Skin color, texture, turgor normal. No rashes or lesions Neurologic: Grossly normal Psych: History of schizophrenia  EKG: Marked sinus bradycardia at 48  ASSESSMENT: 1. Paroxysmal atrial fibrillation, not anticoagulated due to history of warfarin coagulopathy and GI bleeding 2. CHADSVASC score of 4 3. 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 Dyslipidemia  PLAN: 1.   Mrs. 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 It's not clear how long she had been on amiodarone. I suspect from our earlier office records, she was on it at least for 2 years and then probably  another 2 years after she started seeing Dr. Jacinto Halim. It does seem like her exposure is fairly minimal to have developed interstitial fibrosis. She also has a significant smoking history. Nonetheless, she is currently on oxygen therapy. Her amiodarone has been discontinued. There is the possibility she could have recurrent atrial fibrillation. She is on no medicine currently to keep that from coming back however her heart rate is quite low at 48, therefore she is unlikely to tolerate a calcium channel blocker or beta blocker. I'm also concerned about the risk of recurrent stroke. From Hospital records in 2012, her INR was greater than 10 and she had significant GI bleeding for which she had reversal with vitamin K and FFP. While I agree she would not be candidate for warfarin anticoagulation, she may be a candidate for novel oral anticoagulant. We discussed his options today and she seemed amenable to taking Xarelto. Given her reduced GFR of 45, our pharmacist is recommending a 15 mg daily dose.  Plan to see her back in 3 months for close  follow-up.  Chrystie Nose, MD, [facility] Attending Cardiologist [facility]  Chrystie Nose 01/09/2016, 5:24 PM

## 2016-01-17 ENCOUNTER — Ambulatory Visit (INDEPENDENT_AMBULATORY_CARE_PROVIDER_SITE_OTHER): Payer: Medicare Other | Admitting: Internal Medicine

## 2016-01-17 ENCOUNTER — Encounter: Payer: Self-pay | Admitting: Internal Medicine

## 2016-01-17 VITALS — BP 122/68 | HR 71 | Ht 66.0 in | Wt 208.0 lb

## 2016-01-17 DIAGNOSIS — I10 Essential (primary) hypertension: Secondary | ICD-10-CM | POA: Diagnosis not present

## 2016-01-17 DIAGNOSIS — Z720 Tobacco use: Secondary | ICD-10-CM | POA: Diagnosis not present

## 2016-01-17 DIAGNOSIS — F17210 Nicotine dependence, cigarettes, uncomplicated: Secondary | ICD-10-CM

## 2016-01-17 DIAGNOSIS — R0600 Dyspnea, unspecified: Secondary | ICD-10-CM

## 2016-01-17 DIAGNOSIS — J9611 Chronic respiratory failure with hypoxia: Secondary | ICD-10-CM | POA: Insufficient documentation

## 2016-01-17 DIAGNOSIS — J8410 Pulmonary fibrosis, unspecified: Secondary | ICD-10-CM

## 2016-01-17 LAB — PULMONARY FUNCTION TEST
DL/VA % pred: 50 %
DL/VA: 2.59 ml/min/mmHg/L
DLCO UNC: 7.84 ml/min/mmHg
DLCO cor % pred: 27 %
DLCO cor: 7.81 ml/min/mmHg
DLCO unc % pred: 27 %
FEF 25-75 POST: 2.9 L/s
FEF 25-75 Pre: 2.55 L/sec
FEF2575-%CHANGE-POST: 13 %
FEF2575-%PRED-POST: 139 %
FEF2575-%Pred-Pre: 122 %
FEV1-%CHANGE-POST: 4 %
FEV1-%PRED-POST: 80 %
FEV1-%Pred-Pre: 77 %
FEV1-PRE: 1.73 L
FEV1-Post: 1.8 L
FEV1FVC-%CHANGE-POST: 0 %
FEV1FVC-%PRED-PRE: 113 %
FEV6-%Change-Post: 4 %
FEV6-%Pred-Post: 74 %
FEV6-%Pred-Pre: 71 %
FEV6-Post: 2.05 L
FEV6-Pre: 1.96 L
FEV6FVC-%PRED-POST: 104 %
FEV6FVC-%Pred-Pre: 104 %
FVC-%CHANGE-POST: 4 %
FVC-%PRED-POST: 71 %
FVC-%PRED-PRE: 68 %
FVC-POST: 2.05 L
FVC-PRE: 1.96 L
POST FEV1/FVC RATIO: 88 %
PRE FEV1/FVC RATIO: 88 %
PRE FEV6/FVC RATIO: 100 %
Post FEV6/FVC ratio: 100 %
RV % PRED: 47 %
RV: 1.06 L
TLC % pred: 58 %
TLC: 3.24 L

## 2016-01-17 NOTE — Assessment & Plan Note (Signed)
Adequate control on present rx, reviewed>  Dependent edema likely worse since amlodipine increased > Follow up per Primary Care planned

## 2016-01-17 NOTE — Patient Instructions (Addendum)
Continue to wear the 02 at 2lpm 24/7---increase  to 4lpm when walking outside of your home  The amlodipine especially in high doses can make your legs swell > discuss with Dr Leanor Kail  Please schedule a follow up office visit in 6 weeks, call sooner if needed

## 2016-01-17 NOTE — Assessment & Plan Note (Addendum)
01/17/2016   Walked 2lpm 2 laps @ 185 ft each stopped due to  desats corrected on 4lpm   Adequate control on present rx at rest but when walking > 100 ft rec increase to 4lpm for now and hope to see gradual improvement off amio

## 2016-01-17 NOTE — Progress Notes (Signed)
PFT done today. 

## 2016-01-17 NOTE — Assessment & Plan Note (Signed)
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    Please see instructions for details which were reviewed in writing and the patient given a copy highlighting the part that I personally wrote and discussed at today's ov.

## 2016-01-17 NOTE — Progress Notes (Signed)
 Subjective:    Patient ID: [name], female    DOB: 02/15/1951,     MRN: [id number]    Brief patient profile:  64 yobf active smoker with new onset doe x winter of 2016 while on amiodarone per [facility] referred by Dr Osei Bonsu to pulmonary clinic 12/13/2015 sp admit:  Admit date: 10/07/2015 Discharge date: 10/08/2015  Recommendations for Outpatient Follow-up:  1. Pt will need to follow up with PCP in 2-3 weeks post discharge 2. Please obtain BMP to evaluate electrolytes and kidney function 3. Please also check CBC to evaluate Hg and Hct levels 4. Stop Losartan until renal function stabilizes  5. Continue Zithromax to complete therapy 6. Continue prednisone taper  Discharge Diagnoses:  Principal Problem:  Acute respiratory failure (HCC) Active Problems:  Acute on chronic diastolic CHF (congestive heart failure) (HCC)  Schizophrenia (HCC)  HCAP (healthcare-associated pneumonia)  TOBACCO USER  Atrial fibrillation (HCC)  Leukocytosis  CKD (chronic kidney disease) stage 3, GFR 30-59 ml/min  Essential hypertension  Discharge Condition: Stable  Diet recommendation: Heart healthy diet discussed in details   HPI: Pt is 64 yo female who presented to [facility] ED with main concern of 1-2 days duration of progressively worsening dyspnea associate with mix episodes of non productive and productive cough of clear sputum, associated with left sided chest discomfort that is present with coughing spells, subjective fever, chills. Pt reports she has just came back from [place] yesterday. She was recently hospitalized for PNA in September this year. Pt denies abd or urinary concerns.   In ED, pt noted to be hemodynamically stable, VSS, blood work notable for WBC 16 K, imaging studies worrisome for ? Developing PNA. TRH asked to admit for further evaluation.   Assessment and Plan:  Principal Problem:  Acute respiratory failure (HCC) - secondary to acute on chronic  diastolic CHF and HCAP - Lasix upon discharge, ABX for pneumonia - also continue Prednisone taper   Active Problems:  Acute on chronic diastolic CHF (congestive heart failure) (HCC) - last 2 D ECHO 07/2015 wit diastolic grade I CHF and normal EF 60% - continue with Lasix  - weight 93 kg on admission --> 91 kg    Schizophrenia (HCC) - was agitated on ED initially but now more stable and calm   HCAP (healthcare-associated pneumonia) - pneumonia order set in place - follow up on sputum culture, urine legionella and strep penumo - continue ABX    Hypokalemia - supplemented prior discharges    TOBACCO USER - consultation cessation provided    Atrial fibrillation (HCC), CHADS 2 score ~3 - on amiodarone at home, not on AC and pt unable to tell why  - pt currently in sinus rhythm    Leukocytosis - secondary to HCAP - ABX as noted above    CKD (chronic kidney disease) stage 3, GFR 30-59 ml/min - at baseline Cr 1.3 - 1.5   Essential hypertension - reasonably stable so far   12/13/2015 1st [facility] office visit/    Chief Complaint  Patient presents with  . Pulmonary Consult    Referred by Dr. OSei Bonsu.  Pt c/o DOE for the past yr. She gets SOB walking up and down stairs and approx 1 block on a flat surface. She also c/o cough with clear sputum.    cough comes and goes s pattern day and some worse at hs, sob x one flight or one block nl pace, onset was insidious/ pattern is slowly progressive - ?   Transiently better with pred/ neb alb  >>no change rx/rec d/c cigs   01/08/2016 Post Hospital follow up  Patient returns for a post hospital follow-up. Patient   readmitted to the hospital February 9 to February 12 for acute on chronic diastolic heart failure decompensation. 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 She is followed by cardiology for Atrial  fibrillation and was on amiodarone since 2015. Amiodarone was stopped and she improved with aggessive  diuresis.  Since discharge she is feeling better with less cough and dyspnea.  Still smoking, has cut back on cigs . Encouraged cessation .  Now on O2 2l/m .  ESR 33 on 12/19/15  No previous PFT on record.  Seen by Cardiology this am , started on Xarelto .  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 Follow-up    PFT done today. Pt states that her breathing is doing well. Pt does c/o continued SOB with exertion, some cough with clear/white mucus and some wheeze at night. Pt denies CP/tightness.    min use of saba  No obvious day to day or daytime variability or assoc excess/ purulent sputum or mucus plugs or hemoptysis or cp or chest tightness, subjective wheeze or overt sinus or hb symptoms. No unusual exp hx or h/o childhood pna/ asthma or knowledge of premature birth.  Sleeping ok without nocturnal  or early am exacerbation  of respiratory  c/o's or need for noct saba. Also denies any obvious fluctuation of symptoms with weather or environmental changes or other aggravating or alleviating factors except as outlined above   Current Medications, Allergies, Complete Past Medical History, Past Surgical History, Family History, and Social History were reviewed in [facility] electronic medical record.  ROS  The following are not active complaints unless bolded sore throat, dysphagia, dental problems, itching, sneezing,  nasal congestion or excess/ purulent secretions, ear ache,   fever, chills, sweats, unintended wt loss, classically pleuritic or exertional cp,  orthopnea pnd or leg swelling, presyncope, palpitations, abdominal pain, anorexia, nausea, vomiting, diarrhea  or change in bowel or bladder habits, change in stools or urine, dysuria,hematuria,  rash, arthralgias, visual complaints, headache, numbness, weakness or ataxia or problems with walking or coordination,  change in mood/affect  or memory.                Objective:   Physical Exam  amb bf nad     Wt Readings from Last 3 Encounters:  01/17/16 208 lb (94.348 kg)  01/08/16 209 lb (94.802 kg)  01/08/16 208 lb (94.348 kg)    Vital signs reviewed    HEENT: nl dentition, turbinates, and oropharynx. Nl external ear canals without cough reflex   NECK :  without JVD/Nodes/TM/ nl carotid upstrokes bilaterally   LUNGS: no acc muscle use,  Nl contour chest a few insp and exp rhonchi upper > lower s crackles    CV:  RRR  no s3 or murmur or increase in P2, trace  To 1+ bilateral lower ext  Pitting  edema   ABD:  soft and nontender with nl inspiratory excursion in the supine position. No bruits or organomegaly, bowel sounds nl  MS:  Nl gait/ ext warm without deformities, calf tenderness, cyanosis or clubbing No obvious joint restrictions   SKIN: warm and dry without lesions    NEURO:  alert, approp, nl sensorium with  no motor   deficits     01/05/16 CXR PA and Lateral:  Stable chest x-ray. Coarse interstitial markings throughout both lungs, compatible with chronic interstitial lung disease, stable or mildly progressing compared to older exams. Perhaps a chronic mild interstitial edema superimposed on chronic fibrosis.      Assessment & Plan:

## 2016-01-17 NOTE — Assessment & Plan Note (Signed)

## 2016-02-04 ENCOUNTER — Emergency Department (HOSPITAL_COMMUNITY): Payer: Medicare Other

## 2016-02-04 ENCOUNTER — Encounter (HOSPITAL_COMMUNITY): Payer: Self-pay | Admitting: Emergency Medicine

## 2016-02-04 ENCOUNTER — Emergency Department (HOSPITAL_COMMUNITY)
Admission: EM | Admit: 2016-02-04 | Discharge: 2016-02-04 | Disposition: A | Discharge status: Home / Self Care | Payer: Medicare Other | Attending: Physician Assistant | Admitting: Physician Assistant

## 2016-02-04 DIAGNOSIS — M170 Bilateral primary osteoarthritis of knee: Secondary | ICD-10-CM | POA: Insufficient documentation

## 2016-02-04 DIAGNOSIS — Z7901 Long term (current) use of anticoagulants: Secondary | ICD-10-CM | POA: Insufficient documentation

## 2016-02-04 DIAGNOSIS — N183 Chronic kidney disease, stage 3 (moderate): Secondary | ICD-10-CM | POA: Diagnosis not present

## 2016-02-04 DIAGNOSIS — R5383 Other fatigue: Secondary | ICD-10-CM | POA: Diagnosis not present

## 2016-02-04 DIAGNOSIS — R1013 Epigastric pain: Secondary | ICD-10-CM | POA: Diagnosis not present

## 2016-02-04 DIAGNOSIS — F17210 Nicotine dependence, cigarettes, uncomplicated: Secondary | ICD-10-CM | POA: Insufficient documentation

## 2016-02-04 DIAGNOSIS — I509 Heart failure, unspecified: Secondary | ICD-10-CM | POA: Insufficient documentation

## 2016-02-04 DIAGNOSIS — R451 Restlessness and agitation: Secondary | ICD-10-CM | POA: Diagnosis not present

## 2016-02-04 DIAGNOSIS — I129 Hypertensive chronic kidney disease with stage 1 through stage 4 chronic kidney disease, or unspecified chronic kidney disease: Secondary | ICD-10-CM | POA: Insufficient documentation

## 2016-02-04 DIAGNOSIS — K219 Gastro-esophageal reflux disease without esophagitis: Secondary | ICD-10-CM | POA: Insufficient documentation

## 2016-02-04 DIAGNOSIS — Z880 Allergy status to penicillin: Secondary | ICD-10-CM | POA: Diagnosis not present

## 2016-02-04 DIAGNOSIS — Z862 Personal history of diseases of the blood and blood-forming organs and certain disorders involving the immune mechanism: Secondary | ICD-10-CM | POA: Insufficient documentation

## 2016-02-04 DIAGNOSIS — R112 Nausea with vomiting, unspecified: Secondary | ICD-10-CM | POA: Insufficient documentation

## 2016-02-04 DIAGNOSIS — R600 Localized edema: Secondary | ICD-10-CM | POA: Insufficient documentation

## 2016-02-04 DIAGNOSIS — R110 Nausea: Secondary | ICD-10-CM

## 2016-02-04 DIAGNOSIS — Z79899 Other long term (current) drug therapy: Secondary | ICD-10-CM | POA: Diagnosis not present

## 2016-02-04 DIAGNOSIS — I4891 Unspecified atrial fibrillation: Secondary | ICD-10-CM | POA: Diagnosis not present

## 2016-02-04 DIAGNOSIS — F419 Anxiety disorder, unspecified: Secondary | ICD-10-CM | POA: Insufficient documentation

## 2016-02-04 LAB — COMPREHENSIVE METABOLIC PANEL
ALK PHOS: 86 U/L (ref 38–126)
ALT: 21 U/L (ref 14–54)
AST: 25 U/L (ref 15–41)
Albumin: 4 g/dL (ref 3.5–5.0)
Anion gap: 11 (ref 5–15)
BUN: 17 mg/dL (ref 6–20)
CALCIUM: 9.6 mg/dL (ref 8.9–10.3)
CO2: 20 mmol/L — ABNORMAL LOW (ref 22–32)
CREATININE: 1.02 mg/dL — AB (ref 0.44–1.00)
Chloride: 110 mmol/L (ref 101–111)
GFR, EST NON AFRICAN AMERICAN: 56 mL/min — AB (ref >60)
Glucose, Bld: 118 mg/dL — ABNORMAL HIGH (ref 65–99)
Potassium: 3.5 mmol/L (ref 3.5–5.1)
Sodium: 141 mmol/L (ref 135–145)
Total Bilirubin: 1.1 mg/dL (ref 0.3–1.2)
Total Protein: 8.3 g/dL — ABNORMAL HIGH (ref 6.5–8.1)

## 2016-02-04 LAB — URINALYSIS, ROUTINE W REFLEX MICROSCOPIC
Bilirubin Urine: NEGATIVE
Glucose, UA: NEGATIVE mg/dL
HGB URINE DIPSTICK: NEGATIVE
KETONES UR: NEGATIVE mg/dL
Leukocytes, UA: NEGATIVE
Nitrite: NEGATIVE
PROTEIN: NEGATIVE mg/dL
Specific Gravity, Urine: 1.01 (ref 1.005–1.030)
pH: 8 (ref 5.0–8.0)

## 2016-02-04 LAB — I-STAT TROPONIN, ED: TROPONIN I, POC: 0.01 ng/mL (ref 0.00–0.08)

## 2016-02-04 LAB — CBC
HCT: 32.2 % — ABNORMAL LOW (ref 36.0–46.0)
Hemoglobin: 9.9 g/dL — ABNORMAL LOW (ref 12.0–15.0)
MCH: 21.1 pg — AB (ref 26.0–34.0)
MCHC: 30.7 g/dL (ref 30.0–36.0)
MCV: 68.7 fL — ABNORMAL LOW (ref 78.0–100.0)
PLATELETS: 446 10*3/uL — AB (ref 150–400)
RBC: 4.69 MIL/uL (ref 3.87–5.11)
RDW: 17.7 % — ABNORMAL HIGH (ref 11.5–15.5)
WBC: 13.6 10*3/uL — AB (ref 4.0–10.5)

## 2016-02-04 LAB — BRAIN NATRIURETIC PEPTIDE: B NATRIURETIC PEPTIDE 5: 451.3 pg/mL — AB (ref 0.0–100.0)

## 2016-02-04 LAB — LIPASE, BLOOD: Lipase: 36 U/L (ref 11–51)

## 2016-02-04 MED ORDER — ONDANSETRON 4 MG PO TBDP
4.0000 mg | ORAL_TABLET | Freq: Once | ORAL | Status: AC | PRN
  Administered 2016-02-04: 4 mg via ORAL
  Filled 2016-02-04: qty 1

## 2016-02-04 MED ORDER — LORAZEPAM 2 MG/ML IJ SOLN
0.5000 mg | Freq: Once | INTRAMUSCULAR | Status: AC
  Administered 2016-02-04: 0.5 mg via INTRAVENOUS
  Filled 2016-02-04: qty 1

## 2016-02-04 MED ORDER — ONDANSETRON HCL 4 MG PO TABS: 4.0000 mg | ORAL_TABLET | Freq: Three times a day (TID) | ORAL | Status: DC | PRN

## 2016-02-04 MED ORDER — ONDANSETRON HCL 4 MG/2ML IJ SOLN
4.0000 mg | Freq: Once | INTRAMUSCULAR | Status: AC
  Administered 2016-02-04: 4 mg via INTRAVENOUS
  Filled 2016-02-04: qty 2

## 2016-02-04 MED ORDER — GI COCKTAIL ~~LOC~~
30.0000 mL | Freq: Once | ORAL | Status: AC
  Administered 2016-02-04: 30 mL via ORAL
  Filled 2016-02-04: qty 30

## 2016-02-04 MED ORDER — HYDROMORPHONE HCL 1 MG/ML IJ SOLN: 1.0000 mg | Freq: Once | INTRAMUSCULAR | Status: DC

## 2016-02-04 MED ORDER — LORAZEPAM 2 MG/ML IJ SOLN
1.0000 mg | Freq: Once | INTRAMUSCULAR | Status: AC
  Administered 2016-02-04: 1 mg via INTRAVENOUS
  Filled 2016-02-04: qty 1

## 2016-02-04 MED ORDER — SODIUM CHLORIDE 0.9 % IV BOLUS (SEPSIS)
500.0000 mL | Freq: Once | INTRAVENOUS | Status: AC
  Administered 2016-02-04: 500 mL via INTRAVENOUS

## 2016-02-04 MED ORDER — IOHEXOL 300 MG/ML  SOLN
100.0000 mL | Freq: Once | INTRAMUSCULAR | Status: AC | PRN
  Administered 2016-02-04: 100 mL via INTRAVENOUS

## 2016-02-04 MED ORDER — HYDROMORPHONE HCL 1 MG/ML IJ SOLN
0.5000 mg | Freq: Once | INTRAMUSCULAR | Status: AC
  Administered 2016-02-04: 0.5 mg via INTRAVENOUS
  Filled 2016-02-04: qty 1

## 2016-02-04 NOTE — ED Notes (Signed)
Pt placed on 5 lead, sats are 100%, room air. Pt complaining wanting pain meds for her stomach

## 2016-02-04 NOTE — ED Notes (Signed)
Pt states upper abdominal pain, nausea, and vomiting since yesterday, worsening into today. Hyperventilating and dry heaving in triage. Also c/o chronic knee pain bilaterally with walking. On 2L O2 at home at all times, no c/o new SOB or CP.

## 2016-02-04 NOTE — ED Provider Notes (Signed)
CSN: [id number]     Arrival date & time 02/04/16  3903 History   First MD Initiated Contact with Patient 02/04/16 [phone]     No chief complaint on file.    (Consider location/radiation/quality/duration/timing/severity/associated sxs/prior Treatment) HPI   Patient is a 65 year old female with history of schizophrenia, anxiety, depression, hypertension, chronic kidney disease, congestive heart failure presenting today with knee pain and vomiting 2. Patient reports that this morning she felt nauseated and vomited twice. She also reports uncontrolled pain in her knees.  Patient denies any abdominal pain except for mild epigastric pain. She reports no fevers. No diarrhea. No sick contacts.  Had recent hospital stay for shortness of breath. She was started on home oxygen. Followed by pulmonology.  On arrival here patient tachypnic,very high levels of anxiety    Past Medical History  Diagnosis Date  . Hypertension   . Borderline diabetes   . Irregular heart beat   . Hyperlipidemia   . CHF (congestive heart failure) (HCC)   . CKD (chronic kidney disease) stage 3, GFR 30-59 ml/min   . Dysrhythmia     A-fib  . Anxiety   . Depression   . Shortness of breath     on exertion  . GERD (gastroesophageal reflux disease)   . Headache(784.0)   . Arthritis     knees  . Anemia    Past Surgical History  Procedure Laterality Date  . Hemorrhoid surgery    . Esophagogastroduodenoscopy N/A 02/25/2013    Procedure: ESOPHAGOGASTRODUODENOSCOPY (EGD);  Surgeon: Theda Belfast, MD;  Location: [facility];  Service: Endoscopy;  Laterality: N/A;  . Colonoscopy N/A 02/25/2013    Procedure: COLONOSCOPY;  Surgeon: Theda Belfast, MD;  Location: [facility];  Service: Endoscopy;  Laterality: N/A;  . Givens capsule study N/A 02/26/2013    Procedure: GIVENS CAPSULE STUDY;  Surgeon: Theda Belfast, MD;  Location: [facility];  Service: Endoscopy;  Laterality: N/A;  . Toe surgery  05/31/2015   Family History   Problem Relation Age of Onset  . Kidney disease Mother   . Heart attack Mother   . Kidney disease Father   . Hypertension Mother   . Breast cancer Daughter   . Heart disease Brother   . Diabetes Brother   . Lung cancer Brother   . Hypertension Sister   . Hypertension Daughter    Social History  Substance Use Topics  . Smoking status: Current Some Day Smoker -- 0.15 packs/day for 45 years    Types: Cigarettes  . Smokeless tobacco: Current User  . Alcohol Use: No   OB History    No data available     Review of Systems  Constitutional: Positive for fatigue. Negative for fever and activity change.  HENT: Negative for congestion.   Eyes: Negative for discharge.  Respiratory: Negative for cough and chest tightness.   Cardiovascular: Positive for leg swelling. Negative for chest pain.  Gastrointestinal: Positive for nausea and vomiting. Negative for diarrhea and abdominal distention.  Genitourinary: Negative for dysuria.  Musculoskeletal: Negative for joint swelling.  Skin: Negative for rash.  Allergic/Immunologic: Negative for immunocompromised state.  Psychiatric/Behavioral: Positive for agitation. The patient is nervous/anxious.       Allergies  Levaquin and Penicillins  Home Medications   Prior to Admission medications   Medication Sig Start Date End Date Taking? Authorizing Provider  albuterol (PROVENTIL HFA;VENTOLIN HFA) 108 (90 BASE) MCG/ACT inhaler Inhale 1-2 puffs into the lungs every 6 (six) hours as needed for wheezing  or shortness of breath.    Historical Provider, MD  albuterol (PROVENTIL) (2.5 MG/3ML) 0.083% nebulizer solution Take 2.5 mg by nebulization every 6 (six) hours as needed for wheezing or shortness of breath.    Historical Provider, MD  amLODipine (NORVASC) 10 MG tablet Take 10 mg by mouth daily.    Historical Provider, MD  Calcium Carb-Cholecalciferol (CALCIUM + D3 PO) Take 1 tablet by mouth daily.     Historical Provider, MD  cyanocobalamin 2000  MCG tablet Take 2,000 mcg by mouth daily.    Historical Provider, MD  furosemide (LASIX) 40 MG tablet Take 1 tablet (40 mg total) by mouth daily. 01/06/16   Renae Fickle, MD  levalbuterol Pauline Aus) 0.63 MG/3ML nebulizer solution Take 3 mLs (0.63 mg total) by nebulization every 6 (six) hours as needed for wheezing or shortness of breath. 10/08/15   Dorothea Ogle, MD  montelukast (SINGULAIR) 10 MG tablet Take 10 mg by mouth every evening.     Historical Provider, MD  oxyCODONE-acetaminophen (PERCOCET) 7.5-325 MG tablet Take 1 tablet by mouth every 6 (six) hours. 12/19/15   Historical Provider, MD  pantoprazole (PROTONIX) 40 MG tablet Take 40 mg by mouth daily.     Historical Provider, MD  potassium chloride SA (K-DUR,KLOR-CON) 20 MEQ tablet Take 1 tablet (20 mEq total) by mouth 2 (two) times daily. 05/26/12   Richarda Overlie, MD  pravastatin (PRAVACHOL) 20 MG tablet Take 20 mg by mouth daily.    Historical Provider, MD  Rivaroxaban (XARELTO) 15 MG TABS tablet Take 1 tablet (15 mg total) by mouth daily with supper. 01/08/16   Chrystie Nose, MD   BP 125/78 mmHg  Pulse 104  Temp(Src) 98 F (36.7 C) (Oral)  Resp 26  SpO2 99% Physical Exam  Constitutional: She is oriented to person, place, and time. She appears well-developed and well-nourished.  anxious  HENT:  Head: Normocephalic and atraumatic.  Eyes: Conjunctivae are normal. Right eye exhibits no discharge.  Neck: Neck supple.  Cardiovascular: Regular rhythm and normal heart sounds.   No murmur heard. Tachycardic.  Pulmonary/Chest: Effort normal and breath sounds normal. She has no wheezes. She has no rales.  Abdominal: Soft. She exhibits no distension. There is no tenderness.  Mild epigastric pain. No tenderness on exam to abdomen.  Musculoskeletal: Normal range of motion. She exhibits edema.  Neurological: She is oriented to person, place, and time. No cranial nerve deficit.  Skin: Skin is warm and dry. No rash noted. She is not  diaphoretic.  Psychiatric:  Patient anxious, asking for IV narcotic pain for her arthritis.  Nursing note and vitals reviewed.   ED Course  Procedures (including critical care time) Labs Review Labs Reviewed  CBC - Abnormal; Notable for the following:    WBC 13.6 (*)    Hemoglobin 9.9 (*)    HCT 32.2 (*)    MCV 68.7 (*)    MCH 21.1 (*)    RDW 17.7 (*)    Platelets 446 (*)    All other components within normal limits  LIPASE, BLOOD  COMPREHENSIVE METABOLIC PANEL  URINALYSIS, ROUTINE W REFLEX MICROSCOPIC (NOT AT [facility])    Imaging Review No results found. I have personally reviewed and evaluated these images and lab results as part of my medical decision-making.   EKG Interpretation None      MDM   Final diagnoses:  None    Patient's a 65 year old female with history of schizophrenia, anxiety, depression, CHF, CKD presenting today with vomiting  2 and knee pain. 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 She states the pain is unbearable. Patient vomited twice. She has no abdominal pain denies any fever. Denies any diarrhea.  On arrival patient, high anxiety. Patient is 100% on room air. However she arrives with 2 L of oxygen that she's been placed on 4 previous episodes of tachypnea. We'll keep her on her 2 L here.  Patient did not respond to Zofran.  I will trial  patient on IV Ativan both for her anxiety component as well as for her nausea.   I would like to try to avoid IV narcotics for chronic arthritis.  Given that she is no tenderness on abdominal exam and only vomited twice we will await labs forwarded any kind of advanced imaging.   1:22 PM Patient has been resting comfortably for the last 3 Hours. No vomiting. The ativan helped considerably with the breathing and nausea/vomiting. Sleeping in her bed with a sheet over her head. When you wake her up she begins to breathe more quickly and states she has unbearable pain.  Will give symptomatic care for GI and  then by mouth challenge.  Patietn in afib, on xarlto according to recent cards note.   HgB similar to last month. No blood per rectum or vomit.  Patient has had normal vital signs since arrival.   2:08 PM  When we woke up patient for PO challenge and dispo, patient vomited up ginger ale.   Questionable whether this is a volitional issue. Patient requesting IV pain medications. We will give her 1 dose to see if this helps.  Patient says that she can't doesn;t think she can leave  the emergency department or tolerate PO.  We will try one dose of IV narcotics, get CT and then attempt by mouth challenge again.  3:49 PM Patient still with normal vitals. Resting comfortably. No vomiting since ativan. CT is normal.   No admittable labs, vitals or physical exam findings.   Annai Heick Randall An, MD 02/04/16 [id number]

## 2016-02-04 NOTE — Discharge Instructions (Signed)
We think that you have a viral gastroenteritis given that you have nausea. All your labs and CT have been reassuring. If you notice any blood in your vomit or stool please return immediately. If you have any other concerns please return.   Nausea and Vomiting Nausea means you feel sick to your stomach. Throwing up (vomiting) is a reflex where stomach contents come out of your mouth. HOME CARE   Take medicine as told by your doctor.  Do not force yourself to eat. However, you do need to drink fluids.  If you feel like eating, eat a normal diet as told by your doctor.  Eat rice, wheat, potatoes, bread, lean meats, yogurt, fruits, and vegetables.  Avoid high-fat foods.  Drink enough fluids to keep your pee (urine) clear or pale yellow.  Ask your doctor how to replace body fluid losses (rehydrate). Signs of body fluid loss (dehydration) include:  Feeling very thirsty.  Dry lips and mouth.  Feeling dizzy.  Dark pee.  Peeing less than normal.  Feeling confused.  Fast breathing or heart rate. GET HELP RIGHT AWAY IF:   You have blood in your throw up.  You have black or bloody poop (stool).  You have a bad headache or stiff neck.  You feel confused.  You have bad belly (abdominal) pain.  You have chest pain or trouble breathing.  You do not pee at least once every 8 hours.  You have cold, clammy skin.  You keep throwing up after 24 to 48 hours.  You have a fever. MAKE SURE YOU:   Understand these instructions.  Will watch your condition.  Will get help right away if you are not doing well or get worse.   This information is not intended to replace advice given to you by your health care provider. Make sure you discuss any questions you have with your health care provider.   Document Released: 04/28/2008 Document Revised: 02/02/2012 Document Reviewed: 04/11/2011 Elsevier Interactive Patient Education [employer]

## 2016-02-04 NOTE — ED Notes (Signed)
Bed: WA25 Expected date:  Expected time:  Means of arrival:  Comments: Triage 

## 2016-02-04 NOTE — ED Notes (Signed)
Ginger Ale given to patient. 

## 2016-02-04 NOTE — ED Notes (Signed)
Offered patient fluids and she refused them, stated she didn't want to get sick.  Patient is asleep and vital signs are within normal range.

## 2016-02-04 NOTE — ED Notes (Signed)
Patient transported to CT 

## 2016-02-05 LAB — URINE CULTURE: Culture: 4000

## 2016-02-28 ENCOUNTER — Other Ambulatory Visit (INDEPENDENT_AMBULATORY_CARE_PROVIDER_SITE_OTHER): Payer: Medicare Other

## 2016-02-28 ENCOUNTER — Ambulatory Visit (INDEPENDENT_AMBULATORY_CARE_PROVIDER_SITE_OTHER): Payer: Medicare Other | Admitting: Internal Medicine

## 2016-02-28 ENCOUNTER — Telehealth: Payer: Self-pay | Admitting: *Deleted

## 2016-02-28 ENCOUNTER — Ambulatory Visit (INDEPENDENT_AMBULATORY_CARE_PROVIDER_SITE_OTHER)
Admission: RE | Admit: 2016-02-28 | Discharge: 2016-02-28 | Disposition: A | Discharge status: Home / Self Care | Payer: Medicare Other | Source: Ambulatory Visit | Attending: Internal Medicine | Admitting: Internal Medicine

## 2016-02-28 ENCOUNTER — Encounter: Payer: Self-pay | Admitting: Internal Medicine

## 2016-02-28 VITALS — BP 128/70 | HR 55 | Ht 66.0 in | Wt 204.4 lb

## 2016-02-28 DIAGNOSIS — F17210 Nicotine dependence, cigarettes, uncomplicated: Secondary | ICD-10-CM | POA: Diagnosis not present

## 2016-02-28 DIAGNOSIS — R0600 Dyspnea, unspecified: Secondary | ICD-10-CM

## 2016-02-28 DIAGNOSIS — I482 Chronic atrial fibrillation, unspecified: Secondary | ICD-10-CM

## 2016-02-28 DIAGNOSIS — D509 Iron deficiency anemia, unspecified: Secondary | ICD-10-CM

## 2016-02-28 DIAGNOSIS — Z720 Tobacco use: Secondary | ICD-10-CM

## 2016-02-28 DIAGNOSIS — J8410 Pulmonary fibrosis, unspecified: Secondary | ICD-10-CM | POA: Diagnosis not present

## 2016-02-28 DIAGNOSIS — J9611 Chronic respiratory failure with hypoxia: Secondary | ICD-10-CM

## 2016-02-28 LAB — BASIC METABOLIC PANEL
BUN: 20 mg/dL (ref 6–23)
CALCIUM: 9.6 mg/dL (ref 8.4–10.5)
CO2: 23 meq/L (ref 19–32)
CREATININE: 1.13 mg/dL (ref 0.40–1.20)
Chloride: 107 mEq/L (ref 96–112)
GFR: 62.01 mL/min (ref >60.00)
GLUCOSE: 96 mg/dL (ref 70–99)
Potassium: 4.1 mEq/L (ref 3.5–5.1)
SODIUM: 138 meq/L (ref 135–145)

## 2016-02-28 LAB — CBC WITH DIFFERENTIAL/PLATELET
BASOS PCT: 1 % (ref 0.0–3.0)
Eosinophils Relative: 1 % (ref 0.0–5.0)
HCT: 26.6 % — ABNORMAL LOW (ref 36.0–46.0)
Hemoglobin: 7.9 g/dL — CL (ref 12.0–15.0)
LYMPHS PCT: 10 % (ref 12.0–46.0)
MCHC: 29.7 g/dL — ABNORMAL LOW (ref 30.0–36.0)
Monocytes Relative: 1 % (ref 3.0–12.0)
NEUTROS PCT: 87 % (ref 43.0–77.0)
Platelets: 421 10*3/uL — ABNORMAL HIGH (ref 150.0–400.0)
RBC: 4.04 Mil/uL (ref 3.87–5.11)
RDW: 19.3 % — ABNORMAL HIGH (ref 11.5–15.5)
WBC: 13.2 10*3/uL — ABNORMAL HIGH (ref 4.0–10.5)

## 2016-02-28 LAB — BRAIN NATRIURETIC PEPTIDE: Pro B Natriuretic peptide (BNP): 837 pg/mL — ABNORMAL HIGH (ref 0.0–100.0)

## 2016-02-28 LAB — SEDIMENTATION RATE: Sed Rate: 48 mm/hr — ABNORMAL HIGH (ref 0–22)

## 2016-02-28 MED ORDER — PREDNISONE 10 MG PO TABS: ORAL_TABLET | ORAL | Status: DC

## 2016-02-28 NOTE — Assessment & Plan Note (Addendum)
Newly seen on cxr 08/07/2015  - Amiodarone started 11/28/13 by Dr Nadyne Coombes for RAF -  D/C Jan 06 2016  With esr 33 - PFT's  01/17/2016  FEV1  1.80 (80 % ) ratio 88  p 4 % improvement from saba with DLCO  27/27c % corrects to 50 % for alv volume   - ESR up to 48 02/28/2016 > rec short course prednisone   The goal with a chronic steroid dependent illness is always arriving at the lowest effective dose that controls the disease/symptoms and not accepting a set "formula" which is based on statistics or guidelines that don't always take into account patient  variability or the natural hx of the dz in every individual patient, which may well vary over time.  For now therefore I recommend the patient maintain  Just short course prednisone until sort this out   I had an extended discussion with the patient reviewing all relevant studies completed to date and  lasting 25 minutes of a 40 minute extended office  visit    Each maintenance medication was reviewed in detail including most importantly the difference between maintenance and prns and under what circumstances the prns are to be triggered using an action plan format that is not reflected in the computer generated alphabetically organized AVS.    Please see instructions for details which were reviewed in writing and the patient given a copy highlighting the part that I personally wrote and discussed at today's ov.

## 2016-02-28 NOTE — Telephone Encounter (Signed)
Spoke with the pt and notified of recs per MW  She verbalized understanding  I have scheduled ov for 03/03/16

## 2016-02-28 NOTE — Telephone Encounter (Signed)
-----   Message from Nyoka Cowden, MD sent at 02/28/2016  5:06 PM EDT ----- Needs to hold xarelto and return Monday the 10th for fe/tibc/stool cards and repeat cbc

## 2016-02-28 NOTE — Assessment & Plan Note (Signed)
Most likely has acute and chronic occult GIB worse on xarelto > rec hold it and return in 4 days with fe studies/ stool cards and repeat cbc then

## 2016-02-28 NOTE — Progress Notes (Signed)
Quick Note:  Spoke with pt and notified of results per Dr. Wert. Pt verbalized understanding and denied any questions.  ______ 

## 2016-02-28 NOTE — Assessment & Plan Note (Signed)
bnp up / hgb down so will need to hold xarelto for  Few days to sort out > see anemia

## 2016-02-28 NOTE — Assessment & Plan Note (Signed)
>   3 min Discussed the risks and costs (both direct and indirect)  of smoking relative to the benefits of quitting but patient unwilling to commit at this point to a specific quit date.       

## 2016-02-28 NOTE — Patient Instructions (Addendum)
Restart singulair (montelukast)  One daily   Work on inhaler technique:  relax and gently blow all the way out then take a nice smooth deep breath back in, triggering the inhaler at same time you start breathing in.  Hold for up to 5 seconds if you can. Blow out thru nose. Rinse and gargle with water when done  The key is to stop smoking completely before smoking completely stops you!   02 2lpm sitting and 4lpm walking   Prednisone 10 mg take  4 each am x 2 days,   2 each am x 2 days,  1 each am x 2 days and stop   Please remember to go to the lab and x-ray department downstairs for your tests - we will call you with the results when they are available.     See Tammy NP in 4  weeks with all your medications, even over the counter meds, separated in two separate bags, the ones you take no matter what vs the ones you stop once you feel better and take only as needed when you feel you need them.   Tammy  will generate for you a new user friendly medication calendar that will put [place] all on the same page re: your medication use.     Without this process, it simply isn't possible to assure that we are providing  your outpatient care  with  the attention to detail we feel you deserve.   If we cannot assure that you're getting that kind of care,  then we cannot manage your problem effectively from this clinic.  Once you have seen Tammy and we are sure that we're all on the same page with your medication use she will arrange follow up with me Late add hgb down to 7.9 since starting xarelto rec d/c and recheck cbc April 10 with fe studies then

## 2016-02-28 NOTE — Assessment & Plan Note (Signed)
01/17/2016   Walked 2lpm 2 laps @ 185 ft each stopped due to  desats corrected on 4lpm  - 02/28/2016   Walked 4lpm  1 laps @ 185 ft each stopped due to  Sob but sats ok/ pulse up to 145   As of 02/28/2016 rec 21pm at rest/sleep/ and 4lpm with activity

## 2016-02-28 NOTE — Progress Notes (Signed)
 Subjective:    Patient ID: [name], female    DOB: 03/27/1951,     MRN: [id number]    Brief patient profile:  64 yobf active smoker with new onset doe x winter of 2016 while on amiodarone per [facility] referred by Dr Osei Bonsu to pulmonary clinic 12/13/2015 sp admit:  Admit date: 10/07/2015 Discharge date: 10/08/2015 Discharge Diagnoses:  Principal Problem:  Acute respiratory failure (HCC) Active Problems:  Acute on chronic diastolic CHF (congestive heart failure) (HCC)  Schizophrenia (HCC)  HCAP (healthcare-associated pneumonia)  TOBACCO USER  Atrial fibrillation (HCC)  Leukocytosis  CKD (chronic kidney disease) stage 3, GFR 30-59 ml/min  Essential hypertension  Discharge Condition: Stable  Diet recommendation: Heart healthy diet discussed in details   HPI: Pt is 64 yo female who presented to [facility] ED with main concern of 1-2 days duration of progressively worsening dyspnea associate with mix episodes of non productive and productive cough of clear sputum, associated with left sided chest discomfort that is present with coughing spells, subjective fever, chills. Pt reports she has just came back from [place] yesterday. She was recently hospitalized for PNA in September this year. Pt denies abd or urinary concerns.   In ED, pt noted to be hemodynamically stable, VSS, blood work notable for WBC 16 K, imaging studies worrisome for ? Developing PNA. TR[facility] asked to admit for further evaluation.   Assessment and Plan:  Principal Problem:  Acute respiratory failure (HCC) - secondary to acute on chronic diastolic CHF and HCAP - Lasix upon discharge, ABX for pneumonia - also continue Prednisone taper   Active Problems:  Acute on chronic diastolic CHF (congestive heart failure) (HCC) - last 2 D ECHO 07/2015 wit diastolic grade I CHF and normal EF 60% - continue with Lasix  - weight 93 kg on admission --> 91 kg    Schizophrenia (HCC) - was agitated on ED  initially but now more stable and calm   HCAP (healthcare-associated pneumonia) - pneumonia order set in place - follow up on sputum culture, urine legionella and strep penumo - continue ABX    Hypokalemia - supplemented prior discharges    TOBACCO USER - consultation cessation provided    Atrial fibrillation (HCC), CHADS 2 score ~3 - on amiodarone at home, not on AC and pt unable to tell why  - pt currently in sinus rhythm    Leukocytosis - secondary to HCAP - ABX as noted above    CKD (chronic kidney disease) stage 3, GFR 30-59 ml/min - at baseline Cr 1.3 - 1.5   Essential hypertension - reasonably stable so far   12/13/2015 1st [facility] office visit/    Chief Complaint  Patient presents with  . Pulmonary Consult    Referred by Dr. OSei Bonsu.  Pt c/o DOE for the past yr. She gets SOB walking up and down stairs and approx 1 block on a flat surface. She also c/o cough with clear sputum.    cough comes and goes s pattern day and some worse at hs, sob x one flight or one block nl pace, onset was insidious/ pattern is slowly progressive - ? Transiently better with pred/ neb alb  >>no change rx/rec d/c cigs   01/08/2016 Post Hospital follow up  Patient returns for a post hospital follow-up. Patient   readmitted to the hospital February 9 to February 12 for acute on chronic diastolic heart failure decompensation. 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 She is followed by cardiology for   Atrial  fibrillation and was on amiodarone since 2015. Amiodarone was stopped and she improved with aggessive diuresis.  Since discharge she is feeling better with less cough and dyspnea.  Still smoking, has cut back on cigs . Encouraged cessation .  Now on O2 2l/m .  ESR 33 on 12/19/15  No previous PFT on record.  Seen by Cardiology this am , started on Xarelto .  rec No change meds     01/17/2016  f/u ov/ re: chronic resp failure on 2lpm / suspected amio  tox Chief Complaint  Patient presents with  . Follow-up    PFT done today. Pt states that her breathing is doing well. Pt does c/o continued SOB with exertion, some cough with clear/white mucus and some wheeze at night. Pt denies CP/tightness.    min use of saba rec Continue to wear the 02 at 2lpm 24/7---increase  to 4lpm when walking outside of your home The amlodipine especially in high doses can make your legs swell > discuss with Dr Bonsu   02/28/2016  Extended f/u ov/ re: chronic resp failure ? amio toxicity /02 dep  Chief Complaint  Patient presents with  . Follow-up    Breathing worse x 1 wk. She can not breath when she lies flat and has to sleep propped up on pillows. She states gets out of breath walking "2 or 3 steps".  She also c/o prod cough with large amounts of yellow sputum.    insidious onset of worsening  sob x one week with weakness/orthopnea now on xorelto s obvious blood loss  Felt better while on pred, singulair but stopped the latter when started xarelto / no better with saba   No obvious day to day or daytime variability or assoc epistaxis/hemoptysis or cp or chest tightness, subjective wheeze or overt sinus or hb symptoms. No unusual exp hx or h/o childhood pna/ asthma or knowledge of premature birth.  Sleeping ok without nocturnal  or early am exacerbation  of respiratory  c/o's or need for noct saba. Also denies any obvious fluctuation of symptoms with weather or environmental changes or other aggravating or alleviating factors except as outlined above   Current Medications, Allergies, Complete Past Medical History, Past Surgical History, Family History, and Social History were reviewed in [facility] electronic medical record.  ROS  The following are not active complaints unless bolded sore throat, dysphagia, dental problems, itching, sneezing,  nasal congestion or excess/ purulent secretions, ear ache,   fever, chills, sweats, unintended wt loss,  classically pleuritic or exertional cp,  orthopnea pnd or leg swelling, presyncope, palpitations, abdominal pain, anorexia, nausea, vomiting, diarrhea  or change in bowel or bladder habits, change in stools or urine, dysuria,hematuria,  rash, arthralgias, visual complaints, headache, numbness, weakness or ataxia or problems with walking or coordination,  change in mood/affect or memory.                Objective:   Physical Exam  amb bf nad on NP @ 3lpm     02/28/2016         204    01/17/16 208 lb (94.348 kg)  01/08/16 209 lb (94.802 kg)  01/08/16 208 lb (94.348 kg)    Vital signs reviewed    HEENT: nl dentition, turbinates, and oropharynx. Nl external ear canals without cough reflex   NECK :  without JVD/Nodes/TM/ nl carotid upstrokes bilaterally   LUNGS: no acc muscle use,  Nl contour chest a   insp and   exp rhonchi upper > lower s crackles    CV:  RRR  no s3 or murmur or increase in P2, trace  To 1+ bilateral lower ext  Pitting  edema   ABD:  soft and nontender with nl inspiratory excursion in the supine position. No bruits or organomegaly, bowel sounds nl  MS:  Nl gait/ ext warm without deformities, calf tenderness, cyanosis or clubbing No obvious joint restrictions   SKIN: warm and dry without lesions    NEURO:  alert, approp, nl sensorium with  no motor deficits      CXR PA and Lateral:   02/28/2016 :    I personally reviewed images and agree with radiology impression as follows:    Stable chronic interstitial lung disease possibly NSIP as noted above. No active infiltrate or effusion.   Labs ordered/ reviewed:      Chemistry      Component Value Date/Time   NA 138 02/28/2016 1145   K 4.1 02/28/2016 1145   CL 107 02/28/2016 1145   CO2 23 02/28/2016 1145   BUN 20 02/28/2016 1145   CREATININE 1.13 02/28/2016 1145      Component Value Date/Time   CALCIUM 9.6 02/28/2016 1145   ALKPHOS 86 02/04/2016 0925   AST 25 02/04/2016 0925   ALT 21 02/04/2016 0925    BILITOT 1.1 02/04/2016 0925        Lab Results  Component Value Date   WBC 13.2* 02/28/2016   HGB 7.9 Repeated and verified X2.* 02/28/2016   HCT 26.6 Repeated and verified X2.* 02/28/2016   MCV 65.8 Repeated and verified X2.* 02/28/2016   PLT 421.0* 02/28/2016        Lab Results  Component Value Date   TSH 1.57 12/19/2015     Lab Results  Component Value Date   PROBNP 837.0* 02/28/2016       Lab Results  Component Value Date   ESRSEDRATE 48* 02/28/2016   ESRSEDRATE 33* 12/19/2015           Assessment & Plan:

## 2016-03-03 ENCOUNTER — Ambulatory Visit (INDEPENDENT_AMBULATORY_CARE_PROVIDER_SITE_OTHER): Payer: Medicare Other | Admitting: Internal Medicine

## 2016-03-03 ENCOUNTER — Other Ambulatory Visit (INDEPENDENT_AMBULATORY_CARE_PROVIDER_SITE_OTHER): Payer: Medicare Other

## 2016-03-03 ENCOUNTER — Other Ambulatory Visit: Payer: Self-pay | Admitting: Internal Medicine

## 2016-03-03 ENCOUNTER — Encounter: Payer: Self-pay | Admitting: Gastroenterology

## 2016-03-03 ENCOUNTER — Encounter: Payer: Self-pay | Admitting: Internal Medicine

## 2016-03-03 VITALS — BP 108/60 | HR 96 | Ht 66.0 in | Wt 201.0 lb

## 2016-03-03 DIAGNOSIS — Z720 Tobacco use: Secondary | ICD-10-CM

## 2016-03-03 DIAGNOSIS — J9611 Chronic respiratory failure with hypoxia: Secondary | ICD-10-CM

## 2016-03-03 DIAGNOSIS — D509 Iron deficiency anemia, unspecified: Secondary | ICD-10-CM

## 2016-03-03 DIAGNOSIS — F17210 Nicotine dependence, cigarettes, uncomplicated: Secondary | ICD-10-CM

## 2016-03-03 LAB — CBC WITH DIFFERENTIAL/PLATELET
Basophils Relative: 1 % (ref 0.0–3.0)
EOS PCT: 0 % (ref 0.0–5.0)
HEMATOCRIT: 29.5 % — AB (ref 36.0–46.0)
Lymphocytes Relative: 10 % — ABNORMAL LOW (ref 12.0–46.0)
MCHC: 30.1 g/dL (ref 30.0–36.0)
Monocytes Relative: 3 % (ref 3.0–12.0)
Neutrophils Relative %: 86 % — ABNORMAL HIGH (ref 43.0–77.0)
Platelets: 452 10*3/uL — ABNORMAL HIGH (ref 150.0–400.0)
RBC: 4.53 Mil/uL (ref 3.87–5.11)
RDW: 19.2 % — AB (ref 11.5–15.5)
WBC: 15.4 10*3/uL — AB (ref 4.0–10.5)

## 2016-03-03 LAB — IBC PANEL
Saturation Ratios: 1.2 % — ABNORMAL LOW (ref 20.0–50.0)
TRANSFERRIN: 421 mg/dL — AB (ref 212.0–360.0)

## 2016-03-03 MED ORDER — BUDESONIDE-FORMOTEROL FUMARATE 160-4.5 MCG/ACT IN AERO: INHALATION_SPRAY | RESPIRATORY_TRACT | Status: DC

## 2016-03-03 MED ORDER — FERROUS SULFATE 325 (65 FE) MG PO TABS: 325.0000 mg | ORAL_TABLET | Freq: Three times a day (TID) | ORAL | Status: AC

## 2016-03-03 NOTE — Patient Instructions (Addendum)
Add pepcid ac 20 mg at bedtime  The key is to stop smoking completely before smoking completely stops you!   GERD (REFLUX)  is an extremely common cause of respiratory symptoms just like yours , many times with no obvious heartburn at all.    It can be treated with medication, but also with lifestyle changes including elevation of the head of your bed (ideally with 6 inch  bed blocks),  Smoking cessation, avoidance of late meals, excessive alcohol, and avoid fatty foods, chocolate, peppermint, colas, red wine, and acidic juices such as orange juice.  NO MINT OR MENTHOL PRODUCTS SO NO COUGH DROPS  USE SUGARLESS CANDY INSTEAD (Jolley ranchers or Stover's or Life Savers) or even ice chips will also do - the key is to swallow to prevent all throat clearing. NO OIL BASED VITAMINS - use powdered substitutes.  Plan A = Automatic = symbicort 160 Take 2 puffs first thing in am and then another 2 puffs about 12 hours later.      Plan B = Backup Only use your albuterol as a rescue medication to be used if you can't catch your breath by resting or doing a relaxed purse lip breathing pattern.  - The less you use it, the better it will work when you need it. - Ok to use up to 2 puffs  every 4 hours if you must but call for appointment if use goes up over your usual need - Don't leave home without it !!  (think of it like the spare tire for your car)   Plan C = Crisis - only use your albuterol nebulizer if you first try Plan B and it fails to help > ok to use the nebulizer up to every 4 hours but if start needing it regularly call for immediate appointment  Work on inhaler technique:  relax and gently blow all the way out then take a nice smooth deep breath back in, triggering the inhaler at same time you start breathing in.  Hold for up to 5 seconds if you can. Blow out thru nose. Rinse and gargle with water when done        Please see patient coordinator before you leave today  to schedule GI eval of  anemia  Please remember to go to the lab  department downstairs for your tests - we will call you with the results when they are available.  Keep the appt to see Tammy with all medications in the two bags we discussed  Today

## 2016-03-03 NOTE — Progress Notes (Signed)
 Subjective:    Patient ID: [name], female    DOB: 01/18/1951,     MRN: [id number]    Brief patient profile:  64 yobf active smoker with new onset doe x winter of 2016 while on amiodarone per [facility] referred by Dr Osei Bonsu to pulmonary clinic 12/13/2015 sp admit:  Admit date: 10/07/2015 Discharge date: 10/08/2015 Discharge Diagnoses:  Principal Problem:  Acute respiratory failure (HCC) Active Problems:  Acute on chronic diastolic CHF (congestive heart failure) (HCC)  Schizophrenia (HCC)  HCAP (healthcare-associated pneumonia)  TOBACCO USER  Atrial fibrillation (HCC)  Leukocytosis  CKD (chronic kidney disease) stage 3, GFR 30-59 ml/min  Essential hypertension  Discharge Condition: Stable  Diet recommendation: Heart healthy diet discussed in details   HPI: Pt is 64 yo female who presented to [facility] ED with main concern of 1-2 days duration of progressively worsening dyspnea associate with mix episodes of non productive and productive cough of clear sputum, associated with left sided chest discomfort that is present with coughing spells, subjective fever, chills. Pt reports she has just came back from [place] yesterday. She was recently hospitalized for PNA in September this year. Pt denies abd or urinary concerns.   In ED, pt noted to be hemodynamically stable, VSS, blood work notable for WBC 16 K, imaging studies worrisome for ? Developing PNA. TR[facility] asked to admit for further evaluation.   Assessment and Plan:  Principal Problem:  Acute respiratory failure (HCC) - secondary to acute on chronic diastolic CHF and HCAP - Lasix upon discharge, ABX for pneumonia - also continue Prednisone taper   Active Problems:  Acute on chronic diastolic CHF (congestive heart failure) (HCC) - last 2 D ECHO 07/2015 wit diastolic grade I CHF and normal EF 60% - continue with Lasix  - weight 93 kg on admission --> 91 kg    Schizophrenia (HCC) - was agitated on ED  initially but now more stable and calm   HCAP (healthcare-associated pneumonia) - pneumonia order set in place - follow up on sputum culture, urine legionella and strep penumo - continue ABX    Hypokalemia - supplemented prior discharges    TOBACCO USER - consultation cessation provided    Atrial fibrillation (HCC), CHADS 2 score ~3 - on amiodarone at home, not on AC and pt unable to tell why  - pt currently in sinus rhythm    Leukocytosis - secondary to HCAP - ABX as noted above    CKD (chronic kidney disease) stage 3, GFR 30-59 ml/min - at baseline Cr 1.3 - 1.5   Essential hypertension - reasonably stable so far   12/13/2015 1st [facility] office visit/ [name]   Chief Complaint  Patient presents with  . Pulmonary Consult    Referred by Dr. OSei Bonsu.  Pt c/o DOE for the past yr. She gets SOB walking up and down stairs and approx 1 block on a flat surface. She also c/o cough with clear sputum.    cough comes and goes s pattern day and some worse at hs, sob x one flight or one block nl pace, onset was insidious/ pattern is slowly progressive - ? Transiently better with pred/ neb alb  >>no change rx/rec d/c cigs   01/08/2016 Post Hospital follow up  Patient returns for a post hospital follow-up. Patient   readmitted to the hospital February 9 to February 12 for acute on chronic diastolic heart failure decompensation. 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 She is followed by cardiology for   Atrial  fibrillation and was on amiodarone since 2015. Amiodarone was stopped and she improved with aggessive diuresis.  Since discharge she is feeling better with less cough and dyspnea.  Still smoking, has cut back on cigs . Encouraged cessation .  Now on O2 2l/m .  ESR 33 on 12/19/15  No previous PFT on record.  Seen by Cardiology this am , started on Xarelto .  rec No change meds     01/17/2016  f/u ov/[name] re: chronic resp failure on 2lpm / suspected amio  tox Chief Complaint  Patient presents with  . Follow-up    PFT done today. Pt states that her breathing is doing well. Pt does c/o continued SOB with exertion, some cough with clear/white mucus and some wheeze at night. Pt denies CP/tightness.    min use of saba rec Continue to wear the 02 at 2lpm 24/7---increase  to 4lpm when walking outside of your home The amlodipine especially in high doses can make your legs swell > discuss with Dr Leanor Kail   02/28/2016  Extended f/u ov/[name] re: chronic resp failure ? amio toxicity /02 dep  Chief Complaint  Patient presents with  . Follow-up    Breathing worse x 1 wk. She can not breath when she lies flat and has to sleep propped up on pillows. She states gets out of breath walking "2 or 3 steps".  She also c/o prod cough with large amounts of yellow sputum.    insidious onset of worsening  sob x one week with weakness/orthopnea now on xorelto s obvious blood loss  Felt better while on pred, singulair but stopped the latter when started xarelto / no better with saba  rec Restart singulair (montelukast)  One daily  Work on inhaler technique:   The key is to stop smoking completely before smoking completely stops you!  02 2lpm sitting and 4lpm walking  Prednisone 10 mg take  4 each am x 2 days,   2 each am x 2 days,  1 each am x 2 days and stop   hgb down to 7.9 since starting xarelto rec d/c and recheck cbc April 10 with fe studies then    03/03/2016  f/u ov/[name] re: active smoker/ bronchiectasis / chronic resp failure/ 2lpm at rest/ 4lpm walking/ holding xarelto  Chief Complaint  Patient presents with  . Follow-up    Breathing has improved. Increased cough-non prod.   once a week noted brbpr prev w/u in [facility] c/w hemorrhoid  source Cough worse at hs but no excess/ purulent sputum or mucus plugs   Doe = MMRC3 = can't walk 100 yards even at a slow pace at a flat grade s stopping due to sob  Even on 4lpm   No obvious day to day or daytime  variability or assoc epistaxis/hemoptysis or cp or chest tightness, subjective wheeze or overt sinus or hb symptoms. No unusual exp hx or h/o childhood pna/ asthma or knowledge of premature birth.  Sleeping ok without nocturnal  or early am exacerbation  of respiratory  c/o's or need for noct saba. Also denies any obvious fluctuation of symptoms with weather or environmental changes or other aggravating or alleviating factors except as outlined above   Current Medications, Allergies, Complete Past Medical History, Past Surgical History, Family History, and Social History were reviewed in [employer] [facility] record.  ROS  The following are not active complaints unless bolded sore throat, dysphagia, dental problems, itching, sneezing,  nasal congestion  or excess/ purulent secretions, ear ache,   fever, chills, sweats, unintended wt loss, classically pleuritic or exertional cp,  orthopnea pnd or leg swelling, presyncope, palpitations, abdominal pain, anorexia, nausea, vomiting, diarrhea  or change in bowel or bladder habits, change in stools or urine, dysuria,hematuria,  rash, arthralgias, visual complaints, headache, numbness, weakness or ataxia or problems with walking or coordination,  change in mood/affect or memory.                Objective:   Physical Exam  amb bf nad on NP @ 2lpm    03/03/2016      201  02/28/2016         204    01/17/16 208 lb (94.348 kg)  01/08/16 209 lb (94.802 kg)  01/08/16 208 lb (94.348 kg)    Vital signs reviewed    HEENT: nl dentition, turbinates, and oropharynx. Nl external ear canals without cough reflex   NECK :  without JVD/Nodes/TM/ nl carotid upstrokes bilaterally   LUNGS: no acc muscle use,  Nl contour chest   min   insp and exp rhonchi  Bilaterally    CV:  RRR  no s3 or murmur or increase in P2, trace  To 1+ bilateral lower ext  Pitting  edema   ABD:  soft and nontender with nl inspiratory excursion in the supine position. 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 No active infiltrate or effusion.   Labs   reviewed:      Chemistry      Component Value Date/Time   NA 138 02/28/2016 1145   K 4.1 02/28/2016 1145   CL 107 02/28/2016 1145   CO2 23 02/28/2016 1145   BUN 20 02/28/2016 1145   CREATININE 1.13 02/28/2016 1145      Component Value Date/Time   CALCIUM 9.6 02/28/2016 1145   ALKPHOS 86 02/04/2016 0925   AST 25 02/04/2016 0925   ALT 21 02/04/2016 0925   BILITOT 1.1 02/04/2016 0925        Lab Results  Component Value Date   WBC 13.2* 02/28/2016   HGB 7.9 Repeated and verified X2.* 02/28/2016   HCT 26.6 Repeated and verified X2.* 02/28/2016   MCV 65.8 Repeated and verified X2.* 02/28/2016   PLT 421.0* 02/28/2016        Lab Results  Component Value Date   TSH 1.57 12/19/2015     Lab Results  Component Value Date   PROBNP 837.0* 02/28/2016       Lab Results  Component Value Date   ESRSEDRATE 48* 02/28/2016   ESRSEDRATE 33* 12/19/2015           Assessment & Plan:

## 2016-03-03 NOTE — Progress Notes (Signed)
Quick Note:  Spoke with pt and notified of results per Dr. Wert. Pt verbalized understanding and denied any questions.  ______ 

## 2016-03-04 NOTE — Assessment & Plan Note (Signed)
01/17/2016   Walked 2lpm 2 laps @ 185 ft each stopped due to  desats corrected on 4lpm  - 02/28/2016   Walked 4lpm  1 laps @ 185 ft each stopped due to  Sob but sats ok/ pulse up to 145   As of 03/04/2016  rec 21pm at rest/sleep/ and 4lpm with activity

## 2016-03-04 NOTE — Assessment & Plan Note (Addendum)
Hold xarelto 02/28/2016   - FeSat 03/03/2016  = 1.2% > referred to GI/ started on fes02    Lab Results  Component Value Date   HGB 8.9 Repeated and verified X2.* 03/03/2016   HGB 7.9 Repeated and verified X2.* 02/28/2016   HGB 9.9* 02/04/2016    Clearly can't take xarelto for AF until find sourse of fe def anemia/ note already up a gram x 3 d off xarelto

## 2016-03-04 NOTE — Assessment & Plan Note (Signed)
>   3 m I took an extended  opportunity with this patient to outline the consequences of continued cigarette use  in airway disorders based on all the data we have from the multiple national lung health studies (perfomed over decades at millions of dollars in cost)  indicating that smoking cessation, not choice of inhalers or physicians, is the most important aspect of care.    Obviously should not smoke with 02 in use/ advised

## 2016-03-28 ENCOUNTER — Encounter: Payer: Self-pay | Admitting: Adult Health

## 2016-03-28 ENCOUNTER — Ambulatory Visit (INDEPENDENT_AMBULATORY_CARE_PROVIDER_SITE_OTHER): Payer: Medicare Other | Admitting: Adult Health

## 2016-03-28 VITALS — BP 124/68 | HR 61 | Temp 97.8°F | Ht 66.0 in | Wt 202.8 lb

## 2016-03-28 DIAGNOSIS — J479 Bronchiectasis, uncomplicated: Secondary | ICD-10-CM | POA: Diagnosis not present

## 2016-03-28 DIAGNOSIS — J9611 Chronic respiratory failure with hypoxia: Secondary | ICD-10-CM

## 2016-03-28 DIAGNOSIS — D509 Iron deficiency anemia, unspecified: Secondary | ICD-10-CM | POA: Diagnosis not present

## 2016-03-28 NOTE — Assessment & Plan Note (Signed)
Improved control  Restart on synmbicort  Patient's medications were reviewed today and patient education was given. Computerized medication calendar was adjusted/completed   Plan  Take Symbicort 2 puffs Twice daily  , rinse after use.  Restart Oxygen 3l/m with walking and 2l/m at bedtime. .  Follow med calendar closely and bring to each visit.  follow up Dr. Sherene Sires  In 6 weeks and As needed

## 2016-03-28 NOTE — Progress Notes (Signed)
 Subjective:    Patient ID: [name], female    DOB: 09/23/1951,     MRN: [id number]    Brief patient profile:  64 yobf active smoker with new onset doe x winter of 2016 while on amiodarone per [facility] referred by Dr Osei Bonsu to pulmonary clinic 12/13/2015 sp admit:  Admit date: 10/07/2015 Discharge date: 10/08/2015 Discharge Diagnoses:  Principal Problem:  Acute respiratory failure (HCC) Active Problems:  Acute on chronic diastolic CHF (congestive heart failure) (HCC)  Schizophrenia (HCC)  HCAP (healthcare-associated pneumonia)  TOBACCO USER  Atrial fibrillation (HCC)  Leukocytosis  CKD (chronic kidney disease) stage 3, GFR 30-59 ml/min  Essential hypertension  Discharge Condition: Stable  Diet recommendation: Heart healthy diet discussed in details   HPI: Pt is 64 yo female who presented to [facility] ED with main concern of 1-2 days duration of progressively worsening dyspnea associate with mix episodes of non productive and productive cough of clear sputum, associated with left sided chest discomfort that is present with coughing spells, subjective fever, chills. Pt reports she has just came back from [place] yesterday. She was recently hospitalized for PNA in September this year. Pt denies abd or urinary concerns.   In ED, pt noted to be hemodynamically stable, VSS, blood work notable for WBC 16 K, imaging studies worrisome for ? Developing PNA. TRH asked to admit for further evaluation.   Assessment and Plan:  Principal Problem:  Acute respiratory failure (HCC) - secondary to acute on chronic diastolic CHF and HCAP - Lasix upon discharge, ABX for pneumonia - also continue Prednisone taper   Active Problems:  Acute on chronic diastolic CHF (congestive heart failure) (HCC) - last 2 D ECHO 07/2015 wit diastolic grade I CHF and normal EF 60% - continue with Lasix  - weight 93 kg on admission --> 91 kg    Schizophrenia (HCC) - was agitated on ED  initially but now more stable and calm   HCAP (healthcare-associated pneumonia) - pneumonia order set in place - follow up on sputum culture, urine legionella and strep penumo - continue ABX    Hypokalemia - supplemented prior discharges    TOBACCO USER - consultation cessation provided    Atrial fibrillation (HCC), CHADS 2 score ~3 - on amiodarone at home, not on AC and pt unable to tell why  - pt currently in sinus rhythm    Leukocytosis - secondary to HCAP - ABX as noted above    CKD (chronic kidney disease) stage 3, GFR 30-59 ml/min - at baseline Cr 1.3 - 1.5   Essential hypertension - reasonably stable so far   12/13/2015 1st [facility] office visit/ [facility]   Chief Complaint  Patient presents with  . Pulmonary Consult    Referred by Dr. OSei Bonsu.  Pt c/o DOE for the past yr. She gets SOB walking up and down stairs and approx 1 block on a flat surface. She also c/o cough with clear sputum.    cough comes and goes s pattern day and some worse at hs, sob x one flight or one block nl pace, onset was insidious/ pattern is slowly progressive - ? Transiently better with pred/ neb alb  >>no change rx/rec d/c cigs   01/08/2016 Post Hospital follow up  Patient returns for a post hospital follow-up. Patient   readmitted to the hospital February 9 to February 12 for acute on chronic diastolic heart failure decompensation. 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 She is followed by cardiology for   Atrial  fibrillation and was on amiodarone since 2015. Amiodarone was stopped and she improved with aggessive diuresis.  Since discharge she is feeling better with less cough and dyspnea.  Still smoking, has cut back on cigs . Encouraged cessation .  Now on O2 2l/m .  ESR 33 on 12/19/15  No previous PFT on record.  Seen by Cardiology this am , started on Xarelto .  rec No change meds     01/17/2016  f/u ov/[facility] re: chronic resp failure on 2lpm / suspected amio  tox Chief Complaint  Patient presents with  . Follow-up    PFT done today. Pt states that her breathing is doing well. Pt does c/o continued SOB with exertion, some cough with clear/white mucus and some wheeze at night. Pt denies CP/tightness.    min use of saba rec Continue to wear the 02 at 2lpm 24/7---increase  to 4lpm when walking outside of your home The amlodipine especially in high doses can make your legs swell > discuss with Dr Orma Render   02/28/2016  Extended f/u ov/[facility] re: chronic resp failure ? amio toxicity /02 dep  Chief Complaint  Patient presents with  . Follow-up    Breathing worse x 1 wk. She can not breath when she lies flat and has to sleep propped up on pillows. She states gets out of breath walking "2 or 3 steps".  She also c/o prod cough with large amounts of yellow sputum.    insidious onset of worsening  sob x one week with weakness/orthopnea now on xorelto s obvious blood loss  Felt better while on pred, singulair but stopped the latter when started xarelto / no better with saba  rec Restart singulair (montelukast)  One daily  Work on inhaler technique:   The key is to stop smoking completely before smoking completely stops you!  02 2lpm sitting and 4lpm walking  Prednisone 10 mg take  4 each am x 2 days,   2 each am x 2 days,  1 each am x 2 days and stop   hgb down to 7.9 since starting xarelto rec d/c and recheck cbc April 10 with fe studies then    03/03/2016  f/u ov/[facility] re: active smoker/ bronchiectasis / chronic resp failure/ 2lpm at rest/ 4lpm walking/ holding xarelto  Chief Complaint  Patient presents with  . Follow-up    Breathing has improved. Increased cough-non prod.   once a week noted [facility] prev w/u in [facility] c/w hemorrhoid  source Cough worse at hs but no excess/ purulent sputum or mucus plugs   Doe = MMRC3 = can't walk 100 yards even at a slow pace at a flat grade s stopping due to sob  Even on 4lpm  >>labs w/ low hbg , started on iron    03/28/2016 Follow up : smoker/bronchiectasis , chronic resp failure , O2 depend , ? Amio tox -off now, Anemia , Xarelto on hold .  Pt returns for 1 month follow up . Last ov , labs showed severe anemia with hbg at 7.9 with low iron levels. She was started on Iron and referred to GI. Has ov with GI 6/8.   We reviewed her meds and organized them into a med calendar with pt educaiton  . Not taking Symbicort . Not using O2.  Feeling much better . Not wearing o2. Feels she does not need it . Suppose to be on 2l/m rest and 4 walking . Walk test in office with  normal at rest , drop 86% walking . 3l/m with sats >90%.  Cough is much better. Almost all gone.   She denies chest pain, orthopnea, hemoptysis , increased edmea or fever.     Current Medications, Allergies, Complete Past Medical History, Past Surgical History, Family History, and Social History were reviewed in [employer] record.  ROS  The following are not active complaints unless bolded sore throat, dysphagia, dental problems, itching, sneezing,  nasal congestion or excess/ purulent secretions, ear ache,   fever, chills, sweats, unintended wt loss, classically pleuritic or exertional cp,    pnd or l , presyncope, palpitations, abdominal pain, anorexia, nausea, vomiting, diarrhea  or change in bowel or bladder habits, change in stools or urine, dysuria,hematuria,  rash, arthralgias, visual complaints, headache, numbness, weakness or ataxia or problems with walking or coordination,  change in mood/affect or memory.                Objective:   Physical Exam  amb bf nad   03/03/2016      201   Filed Vitals:   03/28/16 0941  BP: 124/68  Pulse: 61  Temp: 97.8 F (36.6 C)  TempSrc: Oral  Height: '5\' 6"'$  (1.676 m)  Weight: 202 lb 12.8 oz (91.989 kg)  SpO2: 93%      Vital signs reviewed    HEENT: nl dentition, turbinates, and oropharynx. Nl external ear canals without cough reflex   NECK :  without  JVD/Nodes/TM/ nl carotid upstrokes bilaterally   LUNGS: no acc muscle use,  Nl contour chest   min   Tr rhonchi    CV:  RRR  no s3 or murmur or increase in P2, trace   bilateral lower ext  Pitting  edema   ABD:  soft and nontender with nl inspiratory excursion in the supine position. No bruits or organomegaly, bowel sounds nl  MS:  Nl gait/ ext warm without deformities, calf tenderness, cyanosis or clubbing No obvious joint restrictions   SKIN: warm and dry without lesions    NEURO:  alert, approp, nl sensorium with  no motor deficits      CXR PA and Lateral:   02/28/2016 :      Stable chronic interstitial lung disease possibly NSIP as noted above. No active infiltrate or effusion.   Labs   reviewed:      Chemistry      Component Value Date/Time   NA 138 02/28/2016 1145   K 4.1 02/28/2016 1145   CL 107 02/28/2016 1145   CO2 23 02/28/2016 1145   BUN 20 02/28/2016 1145   CREATININE 1.13 02/28/2016 1145      Component Value Date/Time   CALCIUM 9.6 02/28/2016 1145   ALKPHOS 86 02/04/2016 0925   AST 25 02/04/2016 0925   ALT 21 02/04/2016 0925   BILITOT 1.1 02/04/2016 0925        Lab Results  Component Value Date   WBC 13.2* 02/28/2016   HGB 7.9 Repeated and verified X2.* 02/28/2016   HCT 26.6 Repeated and verified X2.* 02/28/2016   MCV 65.8 Repeated and verified X2.* 02/28/2016   PLT 421.0* 02/28/2016        Lab Results  Component Value Date   TSH 1.57 12/19/2015     Lab Results  Component Value Date   PROBNP 837.0* 02/28/2016       Lab Results  Component Value Date   ESRSEDRATE 48* 02/28/2016   ESRSEDRATE 33* 12/19/2015  Assessment & Plan:

## 2016-03-28 NOTE — Addendum Note (Signed)
Addended by: Karalee Height on: 03/28/2016 11:05 AM   Modules accepted: Orders, Medications

## 2016-03-28 NOTE — Assessment & Plan Note (Signed)
Improved O2 demands with O2 3l/m walking , and 2 l/m At bedtime

## 2016-03-28 NOTE — Progress Notes (Signed)
Chart and office note reviewed in detail  > agree with a/p as outlined    

## 2016-03-28 NOTE — Assessment & Plan Note (Signed)
Continue on Iron , follow up with GI  Cont follow up with PCP .  Will need to decide on Xarelto restart after GI eval -follow up up cardiology

## 2016-03-28 NOTE — Patient Instructions (Addendum)
Follow up on  05/01/16 @2  pm with Dr. for severe anemia and stomach issues.  Take Symbicort 2 puffs Twice daily  , rinse after use.  Restart Oxygen 3l/m with walking and 2l/m at bedtime. .  Follow med calendar closely and bring to each visit.  follow up Dr. Adela Lank  In 6 weeks and As needed

## 2016-04-08 ENCOUNTER — Encounter: Payer: Self-pay | Admitting: Internal Medicine

## 2016-04-08 ENCOUNTER — Ambulatory Visit (INDEPENDENT_AMBULATORY_CARE_PROVIDER_SITE_OTHER): Payer: Medicare Other | Admitting: Internal Medicine

## 2016-04-08 VITALS — BP 110/62 | HR 70 | Ht 66.0 in | Wt 199.0 lb

## 2016-04-08 DIAGNOSIS — I5033 Acute on chronic diastolic (congestive) heart failure: Secondary | ICD-10-CM

## 2016-04-08 DIAGNOSIS — N183 Chronic kidney disease, stage 3 unspecified: Secondary | ICD-10-CM

## 2016-04-08 DIAGNOSIS — I482 Chronic atrial fibrillation, unspecified: Secondary | ICD-10-CM

## 2016-04-08 DIAGNOSIS — I10 Essential (primary) hypertension: Secondary | ICD-10-CM

## 2016-04-08 DIAGNOSIS — J984 Other disorders of lung: Secondary | ICD-10-CM

## 2016-04-08 DIAGNOSIS — T462X5A Adverse effect of other antidysrhythmic drugs, initial encounter: Secondary | ICD-10-CM

## 2016-04-08 NOTE — Patient Instructions (Signed)
Your physician recommends that you schedule a follow-up appointment in THREE MONTHS with Dr. Hilty.  

## 2016-04-08 NOTE — Progress Notes (Signed)
OFFICE NOTE  Chief Complaint:  Follow-up anticoagulation  Primary Care Physician: [name], MD  HPI:  [name] is a pleasant 65 year old female who I saw remotely in 2012. Subsequently she followed up with another cardiologist in town, Dr. Jacinto Halim in 2015. She presents today's a new patient having not been seen in the last 5 years by myself. Ms. [name] was previously seen in the hospital for acute renal failure, nausea, vomiting, anemia and hypokalemia. At the time she had atrial fibrillation and flutter and she was previously on warfarin however that was stopped due to significant GI bleeding. She was started by my partners on amiodarone for rhythm control as well as metoprolol. I had scheduled her for pulmonary function, liver and thyroid testing. This was in April 2012. She had a recurrent visit scheduled for 07/30/2011 and did not show for that visit. Was lost to follow-up to our office. According to hospital records she was seen in 2015 where she had recurrent atrial fibrillation. She was not on amiodarone at the time. Dr. Jacinto Halim treated her and started her on amiodarone. He did not feel that she was a candidate for warfarin based on her history of GI bleeding. Recently she's had some progressive shortness of breath and was seen by Dr. Sherene Sires and found to have pulmonary fibrosis, suspected to be related to amiodarone. She has maintained sinus rhythm. Her CHADSVASC score is 4. She has been on oxygen therapy for her already fibrosis. She denies any chest pain.  04/08/2016  [name] returns today for follow-up. Previously we had started her on reduced dose Xarelto 15 mg daily because of her reduced renal function. It was felt that she might tolerate this with decreased bleeding likelihood. Unfortunately, she has had progressive anemia which is microcytic suggesting possible GI bleeding. Her pulmonologist discontinue the Xarelto and she is currently awaiting a GI evaluation  which is in early June.  PMHx:  Past Medical History  Diagnosis Date  . Hypertension   . Borderline diabetes   . Irregular heart beat   . Hyperlipidemia   . CHF (congestive heart failure) (HCC)   . CKD (chronic kidney disease) stage 3, GFR 30-59 ml/min   . Dysrhythmia     A-fib  . Anxiety   . Depression   . Shortness of breath     on exertion  . GERD (gastroesophageal reflux disease)   . Headache(784.0)   . Arthritis     knees  . Anemia     Past Surgical History  Procedure Laterality Date  . Hemorrhoid surgery    . Esophagogastroduodenoscopy N/A 02/25/2013    Procedure: ESOPHAGOGASTRODUODENOSCOPY (EGD);  Surgeon: Theda Belfast, MD;  Location: [facility];  Service: Endoscopy;  Laterality: N/A;  . Colonoscopy N/A 02/25/2013    Procedure: COLONOSCOPY;  Surgeon: Theda Belfast, MD;  Location: [facility];  Service: Endoscopy;  Laterality: N/A;  . Givens capsule study N/A 02/26/2013    Procedure: GIVENS CAPSULE STUDY;  Surgeon: Theda Belfast, MD;  Location: [facility];  Service: Endoscopy;  Laterality: N/A;  . Toe surgery  05/31/2015    FAMHx:  Family History  Problem Relation Age of Onset  . Kidney disease Mother   . Heart attack Mother   . Kidney disease Father   . Hypertension Mother   . Breast cancer Daughter   . Heart disease Brother   . Diabetes Brother   . Lung cancer Brother   . Hypertension Sister   . Hypertension Daughter  SOCHx:   reports that she has been smoking Cigarettes.  She has a 6.75 pack-year smoking history. She uses smokeless tobacco. She reports that she uses illicit drugs (Marijuana). She reports that she does not drink alcohol.  ALLERGIES:  Allergies  Allergen Reactions  . Levaquin [Levofloxacin] Hives  . Penicillins Swelling    Has patient had a PCN reaction causing immediate rash, facial/tongue/throat swelling, SOB or lightheadedness with hypotension yes Has patient had a PCN reaction causing severe rash involving mucus membranes or  skin necrosis: unknown Has patient had a PCN reaction that required hospitalization yes Has patient had a PCN reaction occurring within the last 10 years: no If all of the above answers are "NO", then may proceed with Cephalosporin use.     ROS: Pertinent items noted in HPI and remainder of comprehensive ROS otherwise negative.  HOME MEDS: Current Outpatient Prescriptions  Medication Sig Dispense Refill  . albuterol (PROVENTIL) (2.5 MG/3ML) 0.083% nebulizer solution Take 2.5 mg by nebulization every 4 (four) hours as needed for wheezing or shortness of breath.     Marland Kitchen amLODipine (NORVASC) 5 MG tablet Take 5 mg by mouth daily.  0  . budesonide-formoterol (SYMBICORT) 160-4.5 MCG/ACT inhaler Take 2 puffs first thing in am and then another 2 puffs about 12 hours later.    . Calcium Carb-Cholecalciferol (CALCIUM + D3 PO) Take 1 tablet by mouth daily.     . ferrous sulfate 325 (65 FE) MG tablet Take 1 tablet (325 mg total) by mouth 3 (three) times daily with meals. 90 tablet 5  . furosemide (LASIX) 40 MG tablet Take 1 tablet (40 mg total) by mouth daily. (Patient taking differently: Take 40 mg by mouth every morning. ) 30 tablet 0  . montelukast (SINGULAIR) 10 MG tablet Take 10 mg by mouth every evening. Reported on 02/28/2016    . ondansetron (ZOFRAN) 4 MG tablet Take 1 tablet (4 mg total) by mouth every 8 (eight) hours as needed for nausea or vomiting. 11 tablet 0  . oxyCODONE-acetaminophen (PERCOCET) 7.5-325 MG tablet Take 1 tablet by mouth every 6 (six) hours.   0  . OXYGEN Oxygen 2 lpm rest and 3lpm with exertion [employer]    . pantoprazole (PROTONIX) 40 MG tablet Take 40 mg by mouth daily before breakfast.     . potassium chloride SA (K-DUR,KLOR-CON) 20 MEQ tablet Take 1 tablet (20 mEq total) by mouth 2 (two) times daily. 60 tablet 0  . pravastatin (PRAVACHOL) 20 MG tablet Take 20 mg by mouth daily.    . vitamin B-12 (CYANOCOBALAMIN) 500 MCG tablet Take 500 mcg by mouth every morning.      No  current facility-administered medications for this visit.    LABS/IMAGING: No results found for this or any previous visit (from the past 48 hour(s)). No results found.  WEIGHTS: Wt Readings from Last 3 Encounters:  04/08/16 199 lb (90.266 kg)  03/28/16 202 lb 12.8 oz (91.989 kg)  03/03/16 201 lb (91.173 kg)    VITALS: BP 110/62 mmHg  Pulse 70  Ht 5\' 6"  (1.676 m)  Wt 199 lb (90.266 kg)  BMI 32.13 kg/m2  EXAM: Deferred  EKG: Deferred  ASSESSMENT: 1. Paroxysmal atrial fibrillation -  history of warfarin coagulopathy and GI bleeding, recently on Xarelto 2. CHADSVASC score of 4 3. 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 Dyslipidemia 5. Microcytic anemia  PLAN: 1.   [name] has had recurrent anemia on Xarelto suggesting a possible chronic GI blood loss. She  is scheduled to see Dr. Adela Lank in June for evaluation of this. She is currently holding her Xarelto. Renal function x-ray appears to have slightly improved. If we can find a clear reversible cause of her bleeding then I would advocate restarting her Xarelto likely at the 20 mg dose. If she can tolerate this for at least a few months, she may be a candidate for the Watchman left atrial appendage occluder device. She is not interested in long-term anticoagulation and with her GI bleeding history, this could be an alternative to reduce her stroke risk. She would need to be on anticoagulation for at least 2 months.  Plan to see her back in 3 months for close follow-up.  Chrystie Nose, MD, [facility] Attending Cardiologist [facility]  Chrystie Nose 04/08/2016, 6:21 PM

## 2016-04-15 ENCOUNTER — Telehealth: Payer: Self-pay | Admitting: *Deleted

## 2016-04-24 ENCOUNTER — Other Ambulatory Visit: Payer: Self-pay | Admitting: *Deleted

## 2016-04-24 NOTE — Patient Outreach (Signed)
[facility]) Care Management  04/24/2016  [name] March 19, 1951 [id number]  RN Health Coach  attempted #1 outreach call to patient.  Patient was not home. No message left with person answering the phone. RN Health Coach will try back.  Gean Maidens BSN RN [facility] Care Management [phone]

## 2016-04-24 NOTE — Patient Outreach (Signed)
[facility]) Care Management  04/24/2016  ZURI LASCALA [birth year] [id number]   RN Health coach attempted  outreach call to patient.  Patient was not home. No message  Was left with person answering phone    Gean Maidens BSN RN [facility] Care Management [phone]

## 2016-04-29 ENCOUNTER — Other Ambulatory Visit: Payer: Self-pay | Admitting: *Deleted

## 2016-04-29 ENCOUNTER — Ambulatory Visit: Payer: Self-pay | Admitting: *Deleted

## 2016-04-29 NOTE — Patient Outreach (Signed)
[facility]) Care Management  04/29/2016  [name] 12/16/1950 [id number]   RN Health Coach 2nd attempted  Follow up initial outreach call to patient.  Patient was unavailable. HIPPA compliance voicemail message was left with return callback number.    Gean Maidens BSN RN [facility] Care Management [phone]

## 2016-05-01 ENCOUNTER — Encounter: Payer: Self-pay | Admitting: Gastroenterology

## 2016-05-01 ENCOUNTER — Other Ambulatory Visit (INDEPENDENT_AMBULATORY_CARE_PROVIDER_SITE_OTHER): Payer: Medicare Other

## 2016-05-01 ENCOUNTER — Ambulatory Visit (INDEPENDENT_AMBULATORY_CARE_PROVIDER_SITE_OTHER): Payer: Medicare Other | Admitting: Gastroenterology

## 2016-05-01 VITALS — BP 120/70 | HR 68 | Ht 66.0 in | Wt 200.0 lb

## 2016-05-01 DIAGNOSIS — K558 Other vascular disorders of intestine: Secondary | ICD-10-CM | POA: Diagnosis not present

## 2016-05-01 DIAGNOSIS — K5520 Angiodysplasia of colon without hemorrhage: Secondary | ICD-10-CM

## 2016-05-01 DIAGNOSIS — D509 Iron deficiency anemia, unspecified: Secondary | ICD-10-CM | POA: Diagnosis not present

## 2016-05-01 LAB — CBC WITH DIFFERENTIAL/PLATELET
BASOS PCT: 0.1 % (ref 0.0–3.0)
Basophils Absolute: 0 10*3/uL (ref 0.0–0.1)
Eosinophils Absolute: 0.2 10*3/uL (ref 0.0–0.7)
Eosinophils Relative: 1.8 % (ref 0.0–5.0)
HEMATOCRIT: 48 % — AB (ref 36.0–46.0)
Hemoglobin: 15 g/dL (ref 12.0–15.0)
LYMPHS ABS: 1.9 10*3/uL (ref 0.7–4.0)
LYMPHS PCT: 18.9 % (ref 12.0–46.0)
MCHC: 31.2 g/dL (ref 30.0–36.0)
MCV: 78.6 fl (ref 78.0–100.0)
MONOS PCT: 5.2 % (ref 3.0–12.0)
Monocytes Absolute: 0.5 10*3/uL (ref 0.1–1.0)
NEUTROS ABS: 7.3 10*3/uL (ref 1.4–7.7)
Neutrophils Relative %: 74 % (ref 43.0–77.0)
RBC: 6.11 Mil/uL — ABNORMAL HIGH (ref 3.87–5.11)
RDW: 26.1 % — AB (ref 11.5–15.5)
WBC: 9.9 10*3/uL (ref 4.0–10.5)

## 2016-05-01 LAB — IBC PANEL
Iron: 52 ug/dL (ref 42–145)
Saturation Ratios: 13.2 % — ABNORMAL LOW (ref 20.0–50.0)
Transferrin: 282 mg/dL (ref 212.0–360.0)

## 2016-05-01 LAB — FERRITIN: Ferritin: 52.8 ng/mL (ref 10.0–291.0)

## 2016-05-01 NOTE — Progress Notes (Signed)
HPI :  65 y/o female here for evaluation for iron deficiency anemia. She is on home oxygen for history of CHF and chronic respiratory failure, and previously on Xarelto for history of AF. She was last evaluated by Dr. Elnoria Howard in 2014 as an inpatient.   She thinks she has had recurrence of anemia since March. Hgb previously in 12s, then downtrended to 7.9 with low ferritin and iron levels. She denies any blood in the stools. She has a BM once every AM. She is taking iron for roughly a month or so. She reports dark stools since being on iron. Prior to being on iron her stools were brown in the setting of her anemia. She is eating okay. No vomiting. No nausea routinely, only periodically. No abdominal pains that bother her. No FH of gastric or colon cancer. She denies any abdominal pains. She was previously on Xarelto which she has since stopped in April after she was noted to be anemic. She was previously on Coumadin she was hospitalized for GI bleeding. She reports being admitted for melena she thinks in 2012 or so for this issue which led to EGD and colonoscopy which did not find an etiology. In 2014 she was readmitted for symptomatic anemia in the setting of FOBT. 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 She was sent to [facility] for double balloon enteroscope. She was noted to have 8 AVMs between the distal duodenum and mid jejunum which were treated with APC at that time.    Prior workup: Double balloon enteroscopy - ablation of 8 AVMs in the mid small bowel Capsule 2014 - small bowel AVMS EGD 02/25/13 - normal Colonoscopy 02/25/13 - normal Colonoscopy 01/15/11 - hemorrhoids EGD 01/14/11 - normal  Past Medical History  Diagnosis Date  . Hypertension   . Borderline diabetes   . Irregular heart beat   . Hyperlipidemia   . CHF (congestive heart failure) (HCC)   . CKD (chronic kidney disease) stage 3, GFR 30-59 ml/min   .  Dysrhythmia     A-fib  . Anxiety   . Depression   . Shortness of breath     on exertion  . GERD (gastroesophageal reflux disease)   . Headache(784.0)   . Arthritis     knees  . Anemia   . Chronic respiratory failure (HCC)   . AVM (arteriovenous malformation) of small bowel, acquired [facility])      Past Surgical History  Procedure Laterality Date  . Hemorrhoid surgery    . Esophagogastroduodenoscopy N/A 02/25/2013    Procedure: ESOPHAGOGASTRODUODENOSCOPY (EGD);  Surgeon: Theda Belfast, MD;  Location: [facility];  Service: Endoscopy;  Laterality: N/A;  . Colonoscopy N/A 02/25/2013    Procedure: COLONOSCOPY;  Surgeon: Theda Belfast, MD;  Location: [facility] ENDOSCOPY;  Service: Endoscopy;  Laterality: N/A;  . Givens capsule study N/A 02/26/2013    Procedure: GIVENS CAPSULE STUDY;  Surgeon: Theda Belfast, MD;  Location: [facility] ENDOSCOPY;  Service: Endoscopy;  Laterality: N/A;  . Toe surgery  05/31/2015   Family History  Problem Relation Age of Onset  . Kidney disease Mother   . Heart attack Mother   . Kidney disease Father   . Hypertension Mother   . Breast cancer Daughter   . Heart disease Brother   . Diabetes Brother   . Lung cancer Brother   . Hypertension Sister   . Hypertension Daughter  Social History  Substance Use Topics  . Smoking status: Current Every Day Smoker -- 0.15 packs/day for 45 years    Types: Cigarettes  . Smokeless tobacco: Current User     Comment: 4 cigarettes daily trying to quit  . Alcohol Use: No   Current Outpatient Prescriptions  Medication Sig Dispense Refill  . albuterol (PROVENTIL) (2.5 MG/3ML) 0.083% nebulizer solution Take 2.5 mg by nebulization every 4 (four) hours as needed for wheezing or shortness of breath.     Marland Kitchen amLODipine (NORVASC) 5 MG tablet Take 5 mg by mouth daily.  0  . Calcium Carb-Cholecalciferol (CALCIUM + D3 PO) Take 1 tablet by mouth daily.     . ferrous sulfate 325 (65 FE) MG tablet Take 1 tablet (325 mg total) by mouth 3 (three)  times daily with meals. 90 tablet 5  . furosemide (LASIX) 40 MG tablet Take 1 tablet (40 mg total) by mouth daily. (Patient taking differently: Take 40 mg by mouth every morning. ) 30 tablet 0  . montelukast (SINGULAIR) 10 MG tablet Take 10 mg by mouth every evening. Reported on 02/28/2016    . ondansetron (ZOFRAN) 4 MG tablet Take 1 tablet (4 mg total) by mouth every 8 (eight) hours as needed for nausea or vomiting. 11 tablet 0  . oxyCODONE-acetaminophen (PERCOCET) 7.5-325 MG tablet Take 1 tablet by mouth every 6 (six) hours.   0  . OXYGEN Oxygen 2 lpm rest and 3lpm with exertion [employer]    . pantoprazole (PROTONIX) 40 MG tablet Take 40 mg by mouth daily before breakfast.     . potassium chloride SA (K-DUR,KLOR-CON) 20 MEQ tablet Take 1 tablet (20 mEq total) by mouth 2 (two) times daily. 60 tablet 0  . pravastatin (PRAVACHOL) 20 MG tablet Take 20 mg by mouth daily.     No current facility-administered medications for this visit.   Allergies  Allergen Reactions  . Levaquin [Levofloxacin] Hives  . Penicillins Swelling    Has patient had a PCN reaction causing immediate rash, facial/tongue/throat swelling, SOB or lightheadedness with hypotension yes Has patient had a PCN reaction causing severe rash involving mucus membranes or skin necrosis: unknown Has patient had a PCN reaction that required hospitalization yes Has patient had a PCN reaction occurring within the last 10 years: no If all of the above answers are "NO", then may proceed with Cephalosporin use.      Review of Systems: All systems reviewed and negative except where noted in HPI.    CBC Latest Ref Rng 05/01/2016 03/03/2016 02/28/2016  WBC 4.0 - 10.5 K/uL 9.9 15.4(H) 13.2(H)  Hemoglobin 12.0 - 15.0 g/dL 76.1 8.9 Repeated and verified X2.(L) 7.9 Repeated and verified X2.(LL)  Hematocrit 36.0 - 46.0 % 48.0(H) 29.5(L) 26.6 Repeated and verified X2.(L)  Platelets 150.0 - 400.0 K/uL 99.0 Repeated and verified X2.(L) 452.0(H) 421.0(H)     Lab Results  Component Value Date   IRON 52 05/01/2016   TIBC Not calculated due to Iron <10. 05/24/2012   FERRITIN 52.8 05/01/2016      Physical Exam: BP 120/70 mmHg  Pulse 68  Ht 5\' 6"  (1.676 m)  Wt 200 lb (90.719 kg)  BMI 32.30 kg/m2  SpO2 87% Constitutional: Pleasant, female in no acute distress, wearing oxygen HEENT: Normocephalic and atraumatic. Conjunctivae are normal. No scleral icterus. Neck supple.  Cardiovascular: Normal rate, regular rhythm.  Pulmonary/chest: Effort normal and breath sounds with some scattered course BS B Abdominal: Soft, nondistended, nontender. Bowel sounds active throughout. There  are no masses palpable. No hepatomegaly. Extremities: trace edema B Lymphadenopathy: No cervical adenopathy noted. Neurological: Alert and oriented to person place and time. Skin: Skin is warm and dry. No rashes noted. Psychiatric: Normal mood and affect. Behavior is normal.   ASSESSMENT AND PLAN: 65 y/o female with recurrent iron deficiency anemia since 2012, with prior workup as outlined above. She appears to have iron deficiency secondary to small bowel AVMs. She has been on Xarelto most recently with recurrence of anemia but did not have any overt GI bleeding noted. Now on oral iron and has stopped Xarelto, and her Hgb has risen to 15 and her iron stores are normal (labs checked today). At this time, I don't think she needs any further endoscopic evaluation given her significant prior evaluation and this is very likely due to small bowel AVMs.   However, we discussed further management of her atrial fibrillation and she needs to speak with her Cardiologist about this. Without treatment of these AVMs, she is high risk for future anemia recurrence and bleeding if she resumes Xarelto. If she wishes to resume Xarelto I would recommend she follow up with [facility] for repeat enteroscopy, or consider a capsule endoscopy to assess burden of AVMs. Given her other medical  problems, she wishes to avoid any invasive procedures at this time and did not wish to resume Xarelto, however I encouraged her to follow up with her Cardiologist to discuss this issue further to ensure she understands her risk of stroke while not on anticoagulation. I think it would be okay to put her on an aspirin daily at this time, while continuing iron, and see how she does. She will let me know how she wishes to proceed, in the setting she wishes to resume Xarelto, she should contact me to discuss further small bowel evaluation. In the interim, okay to reduce iron to once daily and start an aspirin if she wishes. I would repeat CBC in a few weeks after doing this.   Of note, platelet count noted to be decreased on this lab, which is new, not sure if this represents clumping. Recommend repeating the CBC next week, and if it persists will need to see her primary care for further assessment.   Ileene Patrick, MD [facility] Pager [phone]

## 2016-05-01 NOTE — Patient Instructions (Signed)
Your physician has requested that you go to the basement for  lab work before leaving today.   

## 2016-05-02 ENCOUNTER — Other Ambulatory Visit: Payer: Self-pay | Admitting: *Deleted

## 2016-05-02 DIAGNOSIS — D691 Qualitative platelet defects: Secondary | ICD-10-CM

## 2016-05-12 ENCOUNTER — Encounter: Payer: Self-pay | Admitting: Internal Medicine

## 2016-05-12 ENCOUNTER — Ambulatory Visit: Payer: Self-pay | Admitting: *Deleted

## 2016-05-12 ENCOUNTER — Ambulatory Visit (INDEPENDENT_AMBULATORY_CARE_PROVIDER_SITE_OTHER): Payer: Medicare Other | Admitting: Internal Medicine

## 2016-05-12 VITALS — BP 128/72 | HR 86 | Ht 66.0 in | Wt 200.0 lb

## 2016-05-12 DIAGNOSIS — J9611 Chronic respiratory failure with hypoxia: Secondary | ICD-10-CM

## 2016-05-12 DIAGNOSIS — J479 Bronchiectasis, uncomplicated: Secondary | ICD-10-CM

## 2016-05-12 DIAGNOSIS — J8410 Pulmonary fibrosis, unspecified: Secondary | ICD-10-CM | POA: Diagnosis not present

## 2016-05-12 DIAGNOSIS — D509 Iron deficiency anemia, unspecified: Secondary | ICD-10-CM | POA: Diagnosis not present

## 2016-05-12 NOTE — Progress Notes (Signed)
 Subjective:    Patient ID: [name], female    DOB: 07/13/1951,     MRN: [id number]    Brief patient profile:  65 yobf active smoker with new onset doe x winter of 2016 while on amiodarone per [place] referred by Dr Osei Bonsu to pulmonary clinic 12/13/2015 sp admit:  Admit date: 10/07/2015 Discharge date: 10/08/2015 Discharge Diagnoses:  Principal Problem:  Acute respiratory failure (HCC) Active Problems:  Acute on chronic diastolic CHF (congestive heart failure) (HCC)  Schizophrenia (HCC)  HCAP (healthcare-associated pneumonia)  TOBACCO USER  Atrial fibrillation (HCC)  Leukocytosis  CKD (chronic kidney disease) stage 3, GFR 30-59 ml/min  Essential hypertension  Discharge Condition: Stable  Diet recommendation: Heart healthy diet discussed in details   HPI: Pt is 65 yo female who presented to [facility] ED with main concern of 1-2 days duration of progressively worsening dyspnea associate with mix episodes of non productive and productive cough of clear sputum, associated with left sided chest discomfort that is present with coughing spells, subjective fever, chills. Pt reports she has just came back from [place] yesterday. She was recently hospitalized for PNA in September this year. Pt denies abd or urinary concerns.   In ED, pt noted to be hemodynamically stable, VSS, blood work notable for WBC 16 K, imaging studies worrisome for ? Developing PNA. TRH asked to admit for further evaluation.   Assessment and Plan:  Principal Problem:  Acute respiratory failure (HCC) - secondary to acute on chronic diastolic CHF and HCAP - Lasix upon discharge, ABX for pneumonia - also continue Prednisone taper   Active Problems:  Acute on chronic diastolic CHF (congestive heart failure) (HCC) - last 2 D ECHO 07/2015 wit diastolic grade I CHF and normal EF 60% - continue with Lasix  - weight 93 kg on admission --> 91 kg    Schizophrenia (HCC) - was agitated on ED  initially but now more stable and calm   HCAP (healthcare-associated pneumonia) - pneumonia order set in place - follow up on sputum culture, urine legionella and strep penumo - continue ABX    Hypokalemia - supplemented prior discharges    TOBACCO USER - consultation cessation provided    Atrial fibrillation (HCC), CHADS 2 score ~3 - on amiodarone at home, not on AC and pt unable to tell why  - pt currently in sinus rhythm    Leukocytosis - secondary to HCAP - ABX as noted above    CKD (chronic kidney disease) stage 3, GFR 30-59 ml/min - at baseline Cr 1.3 - 1.5   Essential hypertension - reasonably stable so far   12/13/2015 1st [facility] office visit/ Liadan Guizar   Chief Complaint  Patient presents with  . Pulmonary Consult    Referred by Dr. OSei Bonsu.  Pt c/o DOE for the past yr. She gets SOB walking up and down stairs and approx 1 block on a flat surface. She also c/o cough with clear sputum.    cough comes and goes s pattern day and some worse at hs, sob x one flight or one block nl pace, onset was insidious/ pattern is slowly progressive - ? Transiently better with pred/ neb alb  >>no change rx/rec d/c cigs   01/08/2016 Post Hospital follow up  Patient returns for a post hospital follow-up. Patient   readmitted to the hospital February 9 to February 12 for acute on chronic diastolic heart failure decompensation. 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 She is followed by cardiology for   Atrial  fibrillation and was on amiodarone since 2015. Amiodarone was stopped and she improved with aggessive diuresis.  Since discharge she is feeling better with less cough and dyspnea.  Still smoking, has cut back on cigs . Encouraged cessation .  Now on O2 2l/m .  ESR 33 on 12/19/15  No previous PFT on record.  Seen by Cardiology this am , started on Xarelto .  rec No change meds     01/17/2016  f/u ov/Weslee Fogg re: chronic resp failure on 2lpm / suspected amio  tox Chief Complaint  Patient presents with  . Follow-up    PFT done today. Pt states that her breathing is doing well. Pt does c/o continued SOB with exertion, some cough with clear/white mucus and some wheeze at night. Pt denies CP/tightness.    min use of saba rec Continue to wear the 02 at 2lpm 24/7---increase  to 4lpm when walking outside of your home The amlodipine especially in high doses can make your legs swell > discuss with Dr Orma Render   02/28/2016  Extended f/u ov/Chantele Corado re: chronic resp failure ? amio toxicity /02 dep  Chief Complaint  Patient presents with  . Follow-up    Breathing worse x 1 wk. She can not breath when she lies flat and has to sleep propped up on pillows. She states gets out of breath walking "2 or 3 steps".  She also c/o prod cough with large amounts of yellow sputum.    insidious onset of worsening  sob x one week with weakness/orthopnea now on xorelto s obvious blood loss  Felt better while on pred, singulair but stopped the latter when started xarelto / no better with saba  rec Restart singulair (montelukast)  One daily  Work on inhaler technique:   The key is to stop smoking completely before smoking completely stops you!  02 2lpm sitting and 4lpm walking  Prednisone 10 mg take  4 each am x 2 days,   2 each am x 2 days,  1 each am x 2 days and stop   hgb down to 7.9 since starting xarelto rec d/c and recheck cbc April 10 with fe studies then    03/03/2016  f/u ov/Quantarius Genrich re: active smoker/ bronchiectasis / chronic resp failure/ 2lpm at rest/ 4lpm walking/ holding xarelto  Chief Complaint  Patient presents with  . Follow-up    Breathing has improved. Increased cough-non prod.   once a week noted brbpr prev w/u in [facility] c/w hemorrhoid  source Cough worse at hs but no excess/ purulent sputum or mucus plugs   Doe = MMRC3 = can't walk 100 yards even at a slow pace at a flat grade s stopping due to sob  Even on 4lpm  >>labs w/ low hbg , started on iron    03/28/2016 Follow up : smoker/bronchiectasis , chronic resp failure , O2 depend , ? Amio tox -off now, Anemia , Xarelto on hold .  Pt returns for 1 month follow up . Last ov , labs showed severe anemia with hbg at 7.9 with low iron levels. She was started on Iron and referred to GI. Has ov with GI 6/8./17 rec Follow up on  05/01/16 '@2'$  pm with Dr. Havery Moros for severe anemia and stomach issues.  Take Symbicort 2 puffs Twice daily  , rinse after use.  Restart Oxygen 3l/m with walking and 2l/m at bedtime. .  Follow med calendar closely and bring to each visit.     05/12/2016  f/u ov/Fenna Semel re: smoker/ bronchiectasis/ on symbicort 160 one bid with poor hfa / no med calendar Chief Complaint  Patient presents with  . Follow-up    Breathing is doing well and no new co's today.    States Not limited by breathing from desired activities / much better since on Fe rx  No obvious day to day or daytime variability or assoc chronic cough or cp or chest tightness, subjective wheeze or overt sinus or hb symptoms. No unusual exp hx or h/o childhood pna/ asthma or knowledge of premature birth.  Sleeping ok without nocturnal  or early am exacerbation  of respiratory  c/o's or need for noct saba. Also denies any obvious fluctuation of symptoms with weather or environmental changes or other aggravating or alleviating factors except as outlined above   Current Medications, Allergies, Complete Past Medical History, Past Surgical History, Family History, and Social History were reviewed in [employer] record.  ROS  The following are not active complaints unless bolded sore throat, dysphagia, dental problems, itching, sneezing,  nasal congestion or excess/ purulent secretions, ear ache,   fever, chills, sweats, unintended wt loss, classically pleuritic or exertional cp, hemoptysis,  orthopnea pnd or leg swelling, presyncope, palpitations, abdominal pain, anorexia, nausea, vomiting, diarrhea  or  change in bowel or bladder habits, change in stools or urine, dysuria,hematuria,  rash, arthralgias, visual complaints, headache, numbness, weakness or ataxia or problems with walking or coordination,  change in mood/affect or memory.                      Objective:   Physical Exam  amb bf nad RA   03/03/2016      201  > 05/12/2016   200    Vital signs reviewed    HEENT: nl dentition, turbinates, and oropharynx. Nl external ear canals without cough reflex   NECK :  without JVD/Nodes/TM/ nl carotid upstrokes bilaterally   LUNGS: no acc muscle use,  Nl contour chest   min   Tr rhonchi    CV:  RRR  no s3 or murmur or increase in P2, trace   bilateral lower ext  Pitting  edema   ABD:  soft and nontender with nl inspiratory excursion in the supine position. No bruits or organomegaly, bowel sounds nl  MS:  Nl gait/ ext warm without deformities, calf tenderness, cyanosis or clubbing No obvious joint restrictions   SKIN: warm and dry without lesions    NEURO:  alert, approp, nl sensorium with  no motor deficits      CXR PA and Lateral:   02/28/2016 :      Stable chronic interstitial lung disease possibly NSIP as noted above. No active infiltrate or effusion.   Labs   reviewed:      Chemistry      Component Value Date/Time   NA 138 02/28/2016 1145   K 4.1 02/28/2016 1145   CL 107 02/28/2016 1145   CO2 23 02/28/2016 1145   BUN 20 02/28/2016 1145   CREATININE 1.13 02/28/2016 1145      Component Value Date/Time   CALCIUM 9.6 02/28/2016 1145   ALKPHOS 86 02/04/2016 0925   AST 25 02/04/2016 0925   ALT 21 02/04/2016 0925   BILITOT 1.1 02/04/2016 0925        Lab Results  Component Value Date   WBC 13.2* 02/28/2016   HGB 7.9 Repeated and verified X2.* 02/28/2016   HCT 26.6 Repeated and verified  X2.* 02/28/2016   MCV 65.8 Repeated and verified X2.* 02/28/2016   PLT 421.0* 02/28/2016        Lab Results  Component Value Date   TSH 1.57 12/19/2015     Lab  Results  Component Value Date   PROBNP 837.0* 02/28/2016       Lab Results  Component Value Date   ESRSEDRATE 48* 02/28/2016   ESRSEDRATE 33* 12/19/2015           Assessment & Plan:   Outpatient Encounter Prescriptions as of 05/12/2016  Medication Sig  . albuterol (PROVENTIL) (2.5 MG/3ML) 0.083% nebulizer solution Take 2.5 mg by nebulization every 4 (four) hours as needed for wheezing or shortness of breath.   Marland Kitchen amLODipine (NORVASC) 5 MG tablet Take 5 mg by mouth daily.  . Calcium Carb-Cholecalciferol (CALCIUM + D3 PO) Take 1 tablet by mouth daily.   . ferrous sulfate 325 (65 FE) MG tablet Take 1 tablet (325 mg total) by mouth 3 (three) times daily with meals. (Patient taking differently: Take 325 mg by mouth daily with breakfast. )  . furosemide (LASIX) 40 MG tablet Take 1 tablet (40 mg total) by mouth daily. (Patient taking differently: Take 40 mg by mouth every morning. )  . montelukast (SINGULAIR) 10 MG tablet Take 10 mg by mouth every evening. Reported on 02/28/2016  . ondansetron (ZOFRAN) 4 MG tablet Take 1 tablet (4 mg total) by mouth every 8 (eight) hours as needed for nausea or vomiting.  Marland Kitchen oxyCODONE-acetaminophen (PERCOCET) 7.5-325 MG tablet Take 1 tablet by mouth every 6 (six) hours.   . OXYGEN Oxygen 2 lpm rest and 3lpm with exertion [employer]  . pantoprazole (PROTONIX) 40 MG tablet Take 40 mg by mouth daily before breakfast.   . potassium chloride SA (K-DUR,KLOR-CON) 20 MEQ tablet Take 1 tablet (20 mEq total) by mouth 2 (two) times daily.  . pravastatin (PRAVACHOL) 20 MG tablet Take 20 mg by mouth daily.   No facility-administered encounter medications on file as of 05/12/2016.

## 2016-05-12 NOTE — Patient Instructions (Addendum)
Try off symbicort to see what difference it makes and if none, leave it off, if worse go back to Take 2 puffs first thing in am and then another 2 puffs about 12 hours later.    If you are satisfied with your treatment plan,  let your doctor know and he/she can either refill your medications or you can return here when your prescription runs out.     If in any way you are not 100% satisfied,  please tell us.  If 100% better, tell your friends!  Pulmonary follow up is as needed

## 2016-05-13 ENCOUNTER — Other Ambulatory Visit: Payer: Self-pay | Admitting: *Deleted

## 2016-05-14 ENCOUNTER — Encounter: Payer: Self-pay | Admitting: Internal Medicine

## 2016-05-14 NOTE — Assessment & Plan Note (Signed)
01/17/2016   Walked 2lpm 2 laps @ 185 ft each stopped due to  desats corrected on 4lpm  - 02/28/2016   Walked 4lpm  1 laps @ 185 ft each stopped due to  Sob but sats ok/ pulse up to 145   As of 05/14/2016  rec 21pm at rest/sleep

## 2016-05-14 NOTE — Assessment & Plan Note (Signed)
Newly seen on cxr 08/07/2015  - Amiodarone started 11/28/13 by Dr Nadyne Coombes for RAF -  D/C Jan 06 2016  With esr 33 - PFT's  01/17/2016  FEV1  1.80 (80 % ) ratio 88  p 4 % improvement from saba with DLCO  27/27c % corrects to 50 % for alv volume   - ESR up to 48 02/28/2016 > rec short course prednisone  - improved 05/12/2016 and no longer using 02 daytime > f/u is prn

## 2016-05-14 NOTE — Assessment & Plan Note (Signed)
CT chest 10/07/15 Mild chronic emphysematous changes. Changes of bronchiectasis and bronchial thickening consistent with some mild bronchitis. No focal confluent infiltrate is seen. - pfts 01/17/2016 s obst  -med calendar 03/28/2016 > did not bring to office as rec 05/12/2016  - 05/12/2016  After extensive coaching HFA effectiveness =    50% from baseline of < 10% so rec d/c symbicort > restart 2 bid if flares   Pt not following instructions but actually doing much better so ok off symbicort and pulmonary f/u is prn  I had an extended discussion with the patient reviewing all relevant studies completed to date and  lasting 15 to 20 minutes of a 25 minute visit    Each maintenance medication was reviewed in detail including most importantly the difference between maintenance and prns and under what circumstances the prns are to be triggered using an action plan format that is not reflected in the computer generated alphabetically organized AVS.    Please see instructions for details which were reviewed in writing and the patient given a copy highlighting the part that I personally wrote and discussed at today's ov.

## 2016-05-14 NOTE — Assessment & Plan Note (Signed)
Hold xarelto 02/28/2016   - FeSat 03/03/2016  = 1.2% > referred to GI/ started on fes04  Improved symptomatically/ f/u gi planned and no more xarelto for now

## 2016-05-19 ENCOUNTER — Other Ambulatory Visit (INDEPENDENT_AMBULATORY_CARE_PROVIDER_SITE_OTHER): Payer: Medicare Other

## 2016-05-19 DIAGNOSIS — D691 Qualitative platelet defects: Secondary | ICD-10-CM

## 2016-05-19 LAB — CBC WITH DIFFERENTIAL/PLATELET
Basophils Absolute: 0.1 10*3/uL (ref 0.0–0.1)
Basophils Relative: 0.7 % (ref 0.0–3.0)
EOS PCT: 1.9 % (ref 0.0–5.0)
Eosinophils Absolute: 0.2 10*3/uL (ref 0.0–0.7)
HEMATOCRIT: 51.5 % — AB (ref 36.0–46.0)
Hemoglobin: 16.5 g/dL — ABNORMAL HIGH (ref 12.0–15.0)
LYMPHS ABS: 1.2 10*3/uL (ref 0.7–4.0)
LYMPHS PCT: 12.2 % (ref 12.0–46.0)
MCHC: 32.1 g/dL (ref 30.0–36.0)
MCV: 78.8 fl (ref 78.0–100.0)
MONOS PCT: 4.9 % (ref 3.0–12.0)
Monocytes Absolute: 0.5 10*3/uL (ref 0.1–1.0)
NEUTROS ABS: 8.1 10*3/uL — AB (ref 1.4–7.7)
NEUTROS PCT: 80.3 % — AB (ref 43.0–77.0)
PLATELETS: 239 10*3/uL (ref 150.0–400.0)
RBC: 6.54 Mil/uL — AB (ref 3.87–5.11)
RDW: 24.2 % — ABNORMAL HIGH (ref 11.5–15.5)
WBC: 10.1 10*3/uL (ref 4.0–10.5)

## 2016-06-03 ENCOUNTER — Emergency Department (HOSPITAL_COMMUNITY)
Admission: EM | Admit: 2016-06-03 | Discharge: 2016-06-04 | Disposition: A | Discharge status: Home / Self Care | Payer: Medicare Other | Attending: Emergency Medicine | Admitting: Emergency Medicine

## 2016-06-03 ENCOUNTER — Encounter (HOSPITAL_COMMUNITY): Payer: Self-pay | Admitting: *Deleted

## 2016-06-03 ENCOUNTER — Emergency Department (HOSPITAL_COMMUNITY): Payer: Medicare Other

## 2016-06-03 DIAGNOSIS — R05 Cough: Secondary | ICD-10-CM | POA: Diagnosis not present

## 2016-06-03 DIAGNOSIS — I130 Hypertensive heart and chronic kidney disease with heart failure and stage 1 through stage 4 chronic kidney disease, or unspecified chronic kidney disease: Secondary | ICD-10-CM | POA: Diagnosis not present

## 2016-06-03 DIAGNOSIS — F17210 Nicotine dependence, cigarettes, uncomplicated: Secondary | ICD-10-CM | POA: Insufficient documentation

## 2016-06-03 DIAGNOSIS — J9610 Chronic respiratory failure, unspecified whether with hypoxia or hypercapnia: Secondary | ICD-10-CM | POA: Insufficient documentation

## 2016-06-03 DIAGNOSIS — R112 Nausea with vomiting, unspecified: Secondary | ICD-10-CM | POA: Diagnosis not present

## 2016-06-03 DIAGNOSIS — N183 Chronic kidney disease, stage 3 (moderate): Secondary | ICD-10-CM | POA: Insufficient documentation

## 2016-06-03 DIAGNOSIS — E785 Hyperlipidemia, unspecified: Secondary | ICD-10-CM | POA: Insufficient documentation

## 2016-06-03 DIAGNOSIS — Z79899 Other long term (current) drug therapy: Secondary | ICD-10-CM | POA: Diagnosis not present

## 2016-06-03 DIAGNOSIS — F1290 Cannabis use, unspecified, uncomplicated: Secondary | ICD-10-CM | POA: Insufficient documentation

## 2016-06-03 DIAGNOSIS — I4891 Unspecified atrial fibrillation: Secondary | ICD-10-CM | POA: Insufficient documentation

## 2016-06-03 DIAGNOSIS — I509 Heart failure, unspecified: Secondary | ICD-10-CM | POA: Insufficient documentation

## 2016-06-03 DIAGNOSIS — R1011 Right upper quadrant pain: Secondary | ICD-10-CM | POA: Insufficient documentation

## 2016-06-03 DIAGNOSIS — F329 Major depressive disorder, single episode, unspecified: Secondary | ICD-10-CM | POA: Diagnosis not present

## 2016-06-03 DIAGNOSIS — M179 Osteoarthritis of knee, unspecified: Secondary | ICD-10-CM | POA: Insufficient documentation

## 2016-06-03 DIAGNOSIS — R109 Unspecified abdominal pain: Secondary | ICD-10-CM

## 2016-06-03 LAB — CBC
HEMATOCRIT: 50.4 % — AB (ref 36.0–46.0)
HEMOGLOBIN: 17.1 g/dL — AB (ref 12.0–15.0)
MCH: 26.8 pg (ref 26.0–34.0)
MCHC: 33.9 g/dL (ref 30.0–36.0)
MCV: 79.1 fL (ref 78.0–100.0)
Platelets: 403 10*3/uL — ABNORMAL HIGH (ref 150–400)
RBC: 6.37 MIL/uL — AB (ref 3.87–5.11)
RDW: 17.8 % — ABNORMAL HIGH (ref 11.5–15.5)
WBC: 11.8 10*3/uL — AB (ref 4.0–10.5)

## 2016-06-03 LAB — COMPREHENSIVE METABOLIC PANEL
ALT: 22 U/L (ref 14–54)
AST: 25 U/L (ref 15–41)
Albumin: 4.6 g/dL (ref 3.5–5.0)
Alkaline Phosphatase: 92 U/L (ref 38–126)
Anion gap: 12 (ref 5–15)
BUN: 18 mg/dL (ref 6–20)
CHLORIDE: 112 mmol/L — AB (ref 101–111)
CO2: 20 mmol/L — AB (ref 22–32)
Calcium: 10.2 mg/dL (ref 8.9–10.3)
Creatinine, Ser: 1.09 mg/dL — ABNORMAL HIGH (ref 0.44–1.00)
GFR, EST NON AFRICAN AMERICAN: 52 mL/min — AB (ref >60)
Glucose, Bld: 130 mg/dL — ABNORMAL HIGH (ref 65–99)
POTASSIUM: 3.4 mmol/L — AB (ref 3.5–5.1)
SODIUM: 144 mmol/L (ref 135–145)
Total Bilirubin: 0.9 mg/dL (ref 0.3–1.2)
Total Protein: 9.2 g/dL — ABNORMAL HIGH (ref 6.5–8.1)

## 2016-06-03 LAB — URINALYSIS, ROUTINE W REFLEX MICROSCOPIC
Bilirubin Urine: NEGATIVE
GLUCOSE, UA: NEGATIVE mg/dL
Hgb urine dipstick: NEGATIVE
Ketones, ur: NEGATIVE mg/dL
LEUKOCYTES UA: NEGATIVE
Nitrite: NEGATIVE
PH: 6 (ref 5.0–8.0)
PROTEIN: 30 mg/dL — AB
SPECIFIC GRAVITY, URINE: 1.018 (ref 1.005–1.030)

## 2016-06-03 LAB — LIPASE, BLOOD: LIPASE: 19 U/L (ref 11–51)

## 2016-06-03 LAB — URINE MICROSCOPIC-ADD ON: BACTERIA UA: NONE SEEN

## 2016-06-03 MED ORDER — ONDANSETRON HCL 4 MG/2ML IJ SOLN
4.0000 mg | Freq: Once | INTRAMUSCULAR | Status: AC
  Administered 2016-06-03: 4 mg via INTRAVENOUS
  Filled 2016-06-03: qty 2

## 2016-06-03 MED ORDER — PROMETHAZINE HCL 25 MG/ML IJ SOLN
12.5000 mg | Freq: Once | INTRAMUSCULAR | Status: AC
  Administered 2016-06-04: 12.5 mg via INTRAVENOUS
  Filled 2016-06-03 (×2): qty 1

## 2016-06-03 MED ORDER — MORPHINE SULFATE (PF) 4 MG/ML IV SOLN
4.0000 mg | Freq: Once | INTRAVENOUS | Status: AC
  Administered 2016-06-03: 4 mg via INTRAVENOUS
  Filled 2016-06-03: qty 1

## 2016-06-03 MED ORDER — SODIUM CHLORIDE 0.9 % IV BOLUS (SEPSIS)
500.0000 mL | Freq: Once | INTRAVENOUS | Status: AC
  Administered 2016-06-03: 500 mL via INTRAVENOUS

## 2016-06-03 MED ORDER — ONDANSETRON 4 MG PO TBDP
4.0000 mg | ORAL_TABLET | Freq: Once | ORAL | Status: AC
  Administered 2016-06-03: 4 mg via ORAL
  Filled 2016-06-03: qty 1

## 2016-06-03 NOTE — ED Notes (Addendum)
Patient states she has had right flank/abdominal pain x1 week and today she began vomiting.  Patient endorses nausea/vomiting and productive cough.  Patient took a suppository for nausea earlier today with no relief.  Patient states she has had similar episodes "a lot of times" in past.  Patient denies diarrhea and fever.

## 2016-06-03 NOTE — ED Provider Notes (Signed)
CSN: [id number]     Arrival date & time 06/03/16  1707 History   First MD Initiated Contact with Patient 06/03/16 2031     Chief Complaint  Patient presents with  . Cough  . Abdominal Pain   [name] is a 65 y.o. female who presents to the ED complaining of right flank pain for the past week that worsened today. She reports today she began having nausea and vomiting. She denies any diarrhea. Patient reports chronic cough that has not worsened or changed. She reports her pain is severe and fluctuates in intensity. She is unable to identify alleviating or aggravating factors for her right flank pain. She took a suppository for nausea without relief. Patient denies pain worsen with food intake. She denies fevers, hematemesis, diarrhea, urinary symptoms, worsening cough, shortness of breath, chest pain, palpitations, or rashes.  The history is provided by the patient. No language interpreter was used.    Past Medical History  Diagnosis Date  . Hypertension   . Borderline diabetes   . Irregular heart beat   . Hyperlipidemia   . CHF (congestive heart failure) (HCC)   . Dysrhythmia     A-fib  . Anxiety   . Depression   . Shortness of breath     on exertion  . GERD (gastroesophageal reflux disease)   . Headache(784.0)   . Arthritis     knees  . Anemia   . Chronic respiratory failure (HCC)   . AVM (arteriovenous malformation) of small bowel, acquired (HCC)   . CKD (chronic kidney disease) stage 3, GFR 30-59 ml/min    Past Surgical History  Procedure Laterality Date  . Hemorrhoid surgery    . Esophagogastroduodenoscopy N/A 02/25/2013    Procedure: ESOPHAGOGASTRODUODENOSCOPY (EGD);  Surgeon: Theda Belfast, MD;  Location: [facility];  Service: Endoscopy;  Laterality: N/A;  . Colonoscopy N/A 02/25/2013    Procedure: COLONOSCOPY;  Surgeon: Theda Belfast, MD;  Location: [facility];  Service: Endoscopy;  Laterality: N/A;  . Givens capsule study N/A 02/26/2013    Procedure: GIVENS  CAPSULE STUDY;  Surgeon: Theda Belfast, MD;  Location: [facility];  Service: Endoscopy;  Laterality: N/A;  . Toe surgery  05/31/2015   Family History  Problem Relation Age of Onset  . Kidney disease Mother   . Heart attack Mother   . Kidney disease Father   . Hypertension Mother   . Breast cancer Daughter   . Heart disease Brother   . Diabetes Brother   . Lung cancer Brother   . Hypertension Sister   . Hypertension Daughter    Social History  Substance Use Topics  . Smoking status: Current Every Day Smoker -- 0.15 packs/day for 45 years    Types: Cigarettes  . Smokeless tobacco: Current User     Comment: 4 cigarettes daily trying to quit  . Alcohol Use: No   OB History    No data available     Review of Systems  Constitutional: Negative for fever and chills.  HENT: Negative for congestion and sore throat.   Eyes: Negative for visual disturbance.  Respiratory: Positive for cough. Negative for shortness of breath and wheezing.   Cardiovascular: Negative for chest pain.  Gastrointestinal: Positive for nausea, vomiting and abdominal pain. Negative for diarrhea and blood in stool.  Genitourinary: Positive for flank pain. Negative for dysuria, hematuria and difficulty urinating.  Musculoskeletal: Negative for back pain and neck pain.  Skin: Negative for rash.  Neurological: Negative  for dizziness, light-headedness and headaches.      Allergies  Levaquin and Penicillins  Home Medications   Prior to Admission medications   Medication Sig Start Date End Date Taking? Authorizing Provider  albuterol (PROVENTIL) (2.5 MG/3ML) 0.083% nebulizer solution Take 2.5 mg by nebulization every 4 (four) hours as needed for wheezing or shortness of breath.    Yes Historical Provider, MD  amLODipine (NORVASC) 5 MG tablet Take 5 mg by mouth daily. 02/20/16  Yes Historical Provider, MD  Calcium Carb-Cholecalciferol (CALCIUM + D3 PO) Take 1 tablet by mouth daily.    Yes Historical Provider,  MD  ferrous sulfate 325 (65 FE) MG tablet Take 1 tablet (325 mg total) by mouth 3 (three) times daily with meals. Patient taking differently: Take 325 mg by mouth daily with breakfast.  03/03/16  Yes Nyoka Cowden, MD  furosemide (LASIX) 40 MG tablet Take 1 tablet (40 mg total) by mouth daily. Patient taking differently: Take 40 mg by mouth every morning.  01/06/16  Yes Renae Fickle, MD  montelukast (SINGULAIR) 10 MG tablet Take 10 mg by mouth every evening. Reported on 02/28/2016   Yes Historical Provider, MD  ondansetron (ZOFRAN) 4 MG tablet Take 1 tablet (4 mg total) by mouth every 8 (eight) hours as needed for nausea or vomiting. 02/04/16  Yes Courteney Lyn Mackuen, MD  oxyCODONE-acetaminophen (PERCOCET) 7.5-325 MG tablet Take 1 tablet by mouth every 6 (six) hours.  12/19/15  Yes Historical Provider, MD  OXYGEN Oxygen 2 lpm rest and 3lpm with exertion [facility]   Yes Historical Provider, MD  pantoprazole (PROTONIX) 40 MG tablet Take 40 mg by mouth daily before breakfast.    Yes Historical Provider, MD  potassium chloride SA (K-DUR,KLOR-CON) 20 MEQ tablet Take 1 tablet (20 mEq total) by mouth 2 (two) times daily. 05/26/12  Yes Richarda Overlie, MD  pravastatin (PRAVACHOL) 20 MG tablet Take 20 mg by mouth daily.   Yes Historical Provider, MD  promethazine (PHENERGAN) 25 MG tablet Take 1 tablet (25 mg total) by mouth every 6 (six) hours as needed for nausea. 06/04/16   Benjiman Core, MD   BP 178/92 mmHg  Pulse 99  Temp(Src) 97.4 F (36.3 C) (Oral)  Resp 18  Ht 5\' 6"  (1.676 m)  Wt 90.719 kg  BMI 32.30 kg/m2  SpO2 95% Physical Exam  Constitutional: She appears well-developed and well-nourished. No distress.  Nontoxic appearing.  HENT:  Head: Normocephalic and atraumatic.  Mouth/Throat: Oropharynx is clear and moist.  Eyes: Conjunctivae are normal. Pupils are equal, round, and reactive to light. Right eye exhibits no discharge. Left eye exhibits no discharge.  Neck: Neck supple.   Cardiovascular: Normal rate, regular rhythm, normal heart sounds and intact distal pulses.  Exam reveals no gallop and no friction rub.   No murmur heard. Pulmonary/Chest: Effort normal and breath sounds normal. No respiratory distress.  Abdominal: Soft. Bowel sounds are normal. She exhibits no distension and no mass. There is tenderness. There is no rebound and no guarding.  Abdomen is soft. Bowel sounds are present. Patient has right CVA tenderness as well as right epigastric and right upper quadrant tenderness to palpation. No peritoneal signs.  Musculoskeletal: She exhibits no edema.  No lower extremity edema or tenderness.  Lymphadenopathy:    She has no cervical adenopathy.  Neurological: She is alert. Coordination normal.  Skin: Skin is warm and dry. No rash noted. She is not diaphoretic. No erythema. No pallor.  Psychiatric: She has a normal mood and  affect. Her behavior is normal.  Nursing note and vitals reviewed.   ED Course  Procedures (including critical care time) Labs Review Labs Reviewed  COMPREHENSIVE METABOLIC PANEL - Abnormal; Notable for the following:    Potassium 3.4 (*)    Chloride 112 (*)    CO2 20 (*)    Glucose, Bld 130 (*)    Creatinine, Ser 1.09 (*)    Total Protein 9.2 (*)    GFR calc non Af Amer 52 (*)    All other components within normal limits  CBC - Abnormal; Notable for the following:    WBC 11.8 (*)    RBC 6.37 (*)    Hemoglobin 17.1 (*)    HCT 50.4 (*)    RDW 17.8 (*)    Platelets 403 (*)    All other components within normal limits  URINALYSIS, ROUTINE W REFLEX MICROSCOPIC (NOT AT [facility]) - Abnormal; Notable for the following:    Protein, ur 30 (*)    All other components within normal limits  URINE MICROSCOPIC-ADD ON - Abnormal; Notable for the following:    Squamous Epithelial / LPF 0-5 (*)    All other components within normal limits  LIPASE, BLOOD    Imaging Review Dg Chest 2 View  06/03/2016  CLINICAL DATA:  65 year old female  with cough, nausea vomiting. EXAM: CHEST  2 VIEW COMPARISON:  Chest radiograph dated 02/28/2016 FINDINGS: Two views of the chest demonstrate emphysematous changes of the lungs. There bibasilar linear and platelike atelectatic changes. There is mild interstitial prominence likely related to underlying chronic interstitial lung disease. No focal consolidation, pleural effusion, or pneumothorax. Top-normal cardiac size. No acute osseous pathology. IMPRESSION: No active cardiopulmonary disease. Electronically Signed   By: Elgie Collard M.D.   On: 06/03/2016 21:41   Ct Renal Stone Study  06/03/2016  CLINICAL DATA:  Patient states she has had right flank/abdominal pain x1 week and today she began vomiting. Patient endorses nausea/vomiting and productive cough. EXAM: CT ABDOMEN AND PELVIS WITHOUT CONTRAST TECHNIQUE: Multidetector CT imaging of the abdomen and pelvis was performed following the standard protocol without IV contrast. COMPARISON:  02/04/2016 FINDINGS: Lower chest: Bilateral chronic interstitial lung disease with mild areas of fibrosis. Hepatobiliary: Normal liver.  Normal gallbladder. Pancreas: Normal. Spleen: Normal. Adrenals/Urinary Tract: No urolithiasis or obstructive uropathy. Normal adrenal glands. Normal bladder. Stomach/Bowel: No bowel wall thickening or bowel dilatation. Normal appendix. No pneumatosis, pneumoperitoneum or portal venous gas. No abdominal or pelvic free fluid. Vascular/Lymphatic: Normal caliber abdominal aorta with atherosclerosis. No lymphadenopathy. Reproductive: Normal uterus.  No adnexal mass. Other: No fluid collection or hematoma. Musculoskeletal: No acute osseous abnormality. No lytic or sclerotic osseous lesion. Degenerative disc disease with disc height loss at L5-S1 with bilateral facet arthropathy and foraminal narrowing. IMPRESSION: 1. No acute abdominal or pelvic pathology. 2. Normal appendix. 3. No urolithiasis or obstructive uropathy. Electronically Signed   By:  Elige Ko   On: 06/03/2016 22:13   I have personally reviewed and evaluated these images and lab results as part of my medical decision-making.   EKG Interpretation None      Filed Vitals:   06/03/16 1711 06/03/16 2310 06/04/16 0037  BP: 147/98 173/111 178/92  Pulse: 66 103 99  Temp: 97.4 F (36.3 C)    TempSrc: Oral    Resp: 19 16 18   Height: 5\' 6"  (1.676 m)    Weight: 90.719 kg    SpO2: 94% 94% 95%     MDM   Meds given  in ED:  Medications  ondansetron (ZOFRAN-ODT) disintegrating tablet 4 mg (4 mg Oral Given 06/03/16 1814)  morphine 4 MG/ML injection 4 mg (4 mg Intravenous Given 06/03/16 2117)  ondansetron (ZOFRAN) injection 4 mg (4 mg Intravenous Given 06/03/16 2116)  sodium chloride 0.9 % bolus 500 mL (0 mLs Intravenous Stopped 06/03/16 2200)  promethazine (PHENERGAN) injection 12.5 mg (12.5 mg Intravenous Given 06/04/16 0009)    Discharge Medication List as of 06/04/2016 12:34 AM    START taking these medications   Details  promethazine (PHENERGAN) 25 MG tablet Take 1 tablet (25 mg total) by mouth every 6 (six) hours as needed for nausea., Starting 06/04/2016, Until Discontinued, Print        Final diagnoses:  Flank pain    This is a 65 y.o. female who presents to the ED complaining of right flank pain for the past week that worsened today. She reports today she began having nausea and vomiting. She denies any diarrhea. Patient reports chronic cough that has not worsened. She reports her pain is severe and fluctuates in intensity. She is unable to identify alleviating or aggravating factors for her right flank pain. She took a suppository for nausea without relief. Patient denies pain worsen with food intake. On exam the patient is afebrile nontoxic appearing. Her abdomen is soft. She has epigastric, right upper quadrant and right flank tenderness to palpation. No peritoneal signs. Urinalysis is nitrite and leukocyte negative. No hemoglobin in her urine. Lipase is  within normal limits. CMP shows a creatinine around her baseline. CBC shows a white count of 11,800. Hemoglobin is 17.1.  Chest x-ray shows no acute findings. CT renal stone study shows no acute abdominal or pelvic pathology. Normal appendix. No nephrolithiasis or obstructive uropathy.   While this patient was being worked up I transitioned to fast track area. Dr. Rubin Payor evaluated the patient and provided her with phenergan and discharged her after his evaluation. I only did initial work up.     Everlene Farrier, PA-C 06/04/16 [id number]  Benjiman Core, MD 06/05/16 [phone]

## 2016-06-03 NOTE — ED Notes (Signed)
Patient transported to X-ray 

## 2016-06-03 NOTE — ED Notes (Signed)
Pt continually calling out for more pain medication. PA and MD made aware.

## 2016-06-03 NOTE — ED Notes (Signed)
Pt asking for a 'new doctor' because she wants pain medication or something to put her to sleep. It was explained to the patient that she was already seen by the PA and the MD and they would only be giving her phenergan.

## 2016-06-04 DIAGNOSIS — R1011 Right upper quadrant pain: Secondary | ICD-10-CM | POA: Diagnosis not present

## 2016-06-04 MED ORDER — PROMETHAZINE HCL 25 MG PO TABS: 25.0000 mg | ORAL_TABLET | Freq: Four times a day (QID) | ORAL | Status: AC | PRN

## 2016-06-04 NOTE — Discharge Instructions (Signed)
Flank Pain °Flank pain refers to pain that is located on the side of the body between the upper abdomen and the back. The pain may occur over a short period of time (acute) or may be long-term or reoccurring (chronic). It may be mild or severe. Flank pain can be caused by many things. °CAUSES  °Some of the more common causes of flank pain include: °· Muscle strains.   °· Muscle spasms.   °· A disease of your spine (vertebral disk disease).   °· A lung infection (pneumonia).   °· Fluid around your lungs (pulmonary edema).   °· A kidney infection.   °· Kidney stones.   °· A very painful skin rash caused by the chickenpox virus (shingles).   °· Gallbladder disease.   °HOME CARE INSTRUCTIONS  °Home care will depend on the cause of your pain. In general, °· Rest as directed by your caregiver. °· Drink enough fluids to keep your urine clear or pale yellow. °· Only take over-the-counter or prescription medicines as directed by your caregiver. Some medicines may help relieve the pain. °· Tell your caregiver about any changes in your pain. °· Follow up with your caregiver as directed. °SEEK IMMEDIATE MEDICAL CARE IF:  °· Your pain is not controlled with medicine.   °· You have new or worsening symptoms. °· Your pain increases.   °· You have abdominal pain.   °· You have shortness of breath.   °· You have persistent nausea or vomiting.   °· You have swelling in your abdomen.   °· You feel faint or pass out.   °· You have blood in your urine. °· You have a fever or persistent symptoms for more than 2-3 days. °· You have a fever and your symptoms suddenly get worse. °MAKE SURE YOU:  °· Understand these instructions. °· Will watch your condition. °· Will get help right away if you are not doing well or get worse. °  °This information is not intended to replace advice given to you by your health care provider. Make sure you discuss any questions you have with your health care provider. °  °Document Released: 01/01/2006 Document  Revised: 08/04/2012 Document Reviewed: 06/24/2012 °Elsevier Interactive Patient Education ©2016 Elsevier Inc. ° °

## 2016-06-04 NOTE — ED Notes (Signed)
PT ambulatory and independent at discharge.

## 2016-06-06 ENCOUNTER — Other Ambulatory Visit: Payer: Self-pay | Admitting: *Deleted

## 2016-06-06 ENCOUNTER — Encounter: Payer: Self-pay | Admitting: *Deleted

## 2016-06-06 NOTE — Patient Outreach (Signed)
[facility]) Care Management  06/06/2016  [name] 1951/05/04 [phone]   RN Health Coach  Attempted initial telephone outreach call to patient.  Patient was unavailable.Per voice mail message the phone number has been changes and the new number unknown. Plan: RN will send out an unsuccessful outreach letter with pamphlet , magnet and our number. Gean Maidens BSN RN [facility] Care Management [phone]

## 2016-06-07 ENCOUNTER — Encounter (HOSPITAL_COMMUNITY): Payer: Self-pay

## 2016-06-07 ENCOUNTER — Emergency Department (HOSPITAL_COMMUNITY)
Admission: EM | Admit: 2016-06-07 | Discharge: 2016-06-07 | Disposition: A | Discharge status: Home / Self Care | Payer: Medicare Other | Attending: Emergency Medicine | Admitting: Emergency Medicine

## 2016-06-07 ENCOUNTER — Emergency Department (HOSPITAL_COMMUNITY): Payer: Medicare Other

## 2016-06-07 DIAGNOSIS — I509 Heart failure, unspecified: Secondary | ICD-10-CM | POA: Insufficient documentation

## 2016-06-07 DIAGNOSIS — R110 Nausea: Secondary | ICD-10-CM | POA: Insufficient documentation

## 2016-06-07 DIAGNOSIS — M179 Osteoarthritis of knee, unspecified: Secondary | ICD-10-CM | POA: Diagnosis not present

## 2016-06-07 DIAGNOSIS — F329 Major depressive disorder, single episode, unspecified: Secondary | ICD-10-CM | POA: Insufficient documentation

## 2016-06-07 DIAGNOSIS — I130 Hypertensive heart and chronic kidney disease with heart failure and stage 1 through stage 4 chronic kidney disease, or unspecified chronic kidney disease: Secondary | ICD-10-CM | POA: Insufficient documentation

## 2016-06-07 DIAGNOSIS — R531 Weakness: Secondary | ICD-10-CM | POA: Diagnosis present

## 2016-06-07 DIAGNOSIS — N183 Chronic kidney disease, stage 3 (moderate): Secondary | ICD-10-CM | POA: Insufficient documentation

## 2016-06-07 DIAGNOSIS — R7989 Other specified abnormal findings of blood chemistry: Secondary | ICD-10-CM | POA: Insufficient documentation

## 2016-06-07 LAB — CBC
HEMATOCRIT: 52.4 % — AB (ref 36.0–46.0)
HEMOGLOBIN: 17.6 g/dL — AB (ref 12.0–15.0)
MCH: 26.8 pg (ref 26.0–34.0)
MCHC: 33.6 g/dL (ref 30.0–36.0)
MCV: 79.8 fL (ref 78.0–100.0)
Platelets: 225 10*3/uL (ref 150–400)
RBC: 6.57 MIL/uL — ABNORMAL HIGH (ref 3.87–5.11)
RDW: 15.7 % — AB (ref 11.5–15.5)
WBC: 13.9 10*3/uL — AB (ref 4.0–10.5)

## 2016-06-07 LAB — URINALYSIS, ROUTINE W REFLEX MICROSCOPIC
Glucose, UA: NEGATIVE mg/dL
HGB URINE DIPSTICK: NEGATIVE
KETONES UR: NEGATIVE mg/dL
NITRITE: NEGATIVE
PH: 5.5 (ref 5.0–8.0)
Protein, ur: NEGATIVE mg/dL
SPECIFIC GRAVITY, URINE: 1.025 (ref 1.005–1.030)

## 2016-06-07 LAB — BASIC METABOLIC PANEL
ANION GAP: 10 (ref 5–15)
BUN: 31 mg/dL — ABNORMAL HIGH (ref 6–20)
CALCIUM: 9.1 mg/dL (ref 8.9–10.3)
CO2: 22 mmol/L (ref 22–32)
Chloride: 103 mmol/L (ref 101–111)
Creatinine, Ser: 1.66 mg/dL — ABNORMAL HIGH (ref 0.44–1.00)
GFR calc Af Amer: 36 mL/min — ABNORMAL LOW (ref >60)
GFR, EST NON AFRICAN AMERICAN: 31 mL/min — AB (ref >60)
Glucose, Bld: 98 mg/dL (ref 65–99)
POTASSIUM: 3.1 mmol/L — AB (ref 3.5–5.1)
SODIUM: 135 mmol/L (ref 135–145)

## 2016-06-07 LAB — I-STAT TROPONIN, ED: Troponin i, poc: 0.01 ng/mL (ref 0.00–0.08)

## 2016-06-07 LAB — URINE MICROSCOPIC-ADD ON: RBC / HPF: NONE SEEN RBC/hpf (ref 0–5)

## 2016-06-07 LAB — CBG MONITORING, ED: Glucose-Capillary: 102 mg/dL — ABNORMAL HIGH (ref 65–99)

## 2016-06-07 MED ORDER — SODIUM CHLORIDE 0.9 % IV BOLUS (SEPSIS)
500.0000 mL | Freq: Once | INTRAVENOUS | Status: AC
  Administered 2016-06-07: 500 mL via INTRAVENOUS

## 2016-06-07 NOTE — Discharge Instructions (Signed)
Nausea, Adult Nausea is the feeling that you have an upset stomach or have to vomit. Nausea by itself is not likely a serious concern, but it may be an early sign of more serious medical problems. As nausea gets worse, it can lead to vomiting. If vomiting develops, there is the risk of dehydration.  CAUSES   Viral infections.  Food poisoning.  Medicines.  Pregnancy.  Motion sickness.  Migraine headaches.  Emotional distress.  Severe pain from any source.  Alcohol intoxication. HOME CARE INSTRUCTIONS  Get plenty of rest.  Ask your caregiver about specific rehydration instructions.  Eat small amounts of food and sip liquids more often.  Take all medicines as told by your caregiver. SEEK MEDICAL CARE IF:  You have not improved after 2 days, or you get worse.  You have a headache. SEEK IMMEDIATE MEDICAL CARE IF:   You have a fever.  You faint.  You keep vomiting or have blood in your vomit.  You are extremely weak or dehydrated.  You have dark or bloody stools.  You have severe chest or abdominal pain. MAKE SURE YOU:  Understand these instructions.  Will watch your condition.  Will get help right away if you are not doing well or get worse.   This information is not intended to replace advice given to you by your health care provider. Make sure you discuss any questions you have with your health care provider.   Document Released: 12/18/2004 Document Revised: 12/01/2014 Document Reviewed: 07/23/2011 Elsevier Interactive Patient Education 2016 Elsevier Inc[employer]  Nausea, Adult Nausea is the feeling that you have an upset stomach or have to vomit. Nausea by itself is not likely a serious concern, but it may be an early sign of more serious medical problems. As nausea gets worse, it can lead to vomiting. If vomiting develops, there is the risk of dehydration.  CAUSES   Viral infections.  Food poisoning.  Medicines.  Pregnancy.  Motion  sickness.  Migraine headaches.  Emotional distress.  Severe pain from any source.  Alcohol intoxication. HOME CARE INSTRUCTIONS  Get plenty of rest.  Ask your caregiver about specific rehydration instructions.  Eat small amounts of food and sip liquids more often.  Take all medicines as told by your caregiver. SEEK MEDICAL CARE IF:  You have not improved after 2 days, or you get worse.  You have a headache. SEEK IMMEDIATE MEDICAL CARE IF:   You have a fever.  You faint.  You keep vomiting or have blood in your vomit.  You are extremely weak or dehydrated.  You have dark or bloody stools.  You have severe chest or abdominal pain. MAKE SURE YOU:  Understand these instructions.  Will watch your condition.  Will get help right away if you are not doing well or get worse.   This information is not intended to replace advice given to you by your health care provider. Make sure you discuss any questions you have with your health care provider.   Document Released: 12/18/2004 Document Revised: 12/01/2014 Document Reviewed: 07/23/2011 Elsevier Interactive Patient Education 2016 Elsevier Inc.  

## 2016-06-07 NOTE — ED Notes (Signed)
Patient aware that urine sample is needed.

## 2016-06-07 NOTE — ED Notes (Signed)
Patient responds to voice but quickly closes eyes and will not answer questions. Patient responds to pain when being stuck for blood draw.

## 2016-06-07 NOTE — ED Notes (Signed)
Patient d-sat to 86% on ra. Patient placed on 2L via [place]

## 2016-06-07 NOTE — ED Notes (Signed)
Patient arrives by EMS, called out by family for complaints of nausea and vomiting/weakness-EMS states BP 100/70-weak radials-unable to get manual-116 systolic/#20 left anterior wrist/12 lead showed prolonged QT

## 2016-06-07 NOTE — ED Provider Notes (Signed)
CSN: [id number]     Arrival date & time 06/07/16  0104 History   By signing my name below, I, Suzan Slick. Elon Spanner, attest that this documentation has been prepared under the direction and in the presence of Rolan Bucco, MD.  Electronically Signed: Suzan Slick. Elon Spanner, ED Scribe. 06/07/2016. 3:20 AM.   Chief Complaint  Patient presents with  . Weakness  . Nausea   The history is provided by the patient. No language interpreter was used.    HPI Comments: MABELLE MUNGIN brought in by EMS is a 65 y.o. female with a PMHx of HTN, CHF, hyperlipidemia, and CKD who presents to the Emergency Department complaining of intermittent, unchanged abdominal pain with associated nausea x 1 day. Pain is described as cramping, however, abdominal pain has now resolved. No aggravating or alleviating factors at this time. Prescribed Phenergan attempted at home for nasuea with mild temporary improvement. No recent fever or chills. Brother states pt was "unresponsive" earlier this evening after taking her nausea medication. He states she would not answer any questions when prompted. Pt now states "i feel great".  She currently denies any nausea or abdominal pain.  She was recently evaluated in the hospital on 7/13 for flank pain. Imaging and blood work performed which did not show any abnormal findings. Pt was then safely discharged home.  PCP: [name], MD    Past Medical History  Diagnosis Date  . Hypertension   . Borderline diabetes   . Irregular heart beat   . Hyperlipidemia   . CHF (congestive heart failure) (HCC)   . Dysrhythmia     A-fib  . Anxiety   . Depression   . Shortness of breath     on exertion  . GERD (gastroesophageal reflux disease)   . Headache(784.0)   . Arthritis     knees  . Anemia   . Chronic respiratory failure (HCC)   . AVM (arteriovenous malformation) of small bowel, acquired (HCC)   . CKD (chronic kidney disease) stage 3, GFR 30-59 ml/min    Past Surgical History   Procedure Laterality Date  . Hemorrhoid surgery    . Esophagogastroduodenoscopy N/A 02/25/2013    Procedure: ESOPHAGOGASTRODUODENOSCOPY (EGD);  Surgeon: Theda Belfast, MD;  Location: [facility];  Service: Endoscopy;  Laterality: N/A;  . Colonoscopy N/A 02/25/2013    Procedure: COLONOSCOPY;  Surgeon: Theda Belfast, MD;  Location: [facility];  Service: Endoscopy;  Laterality: N/A;  . Givens capsule study N/A 02/26/2013    Procedure: GIVENS CAPSULE STUDY;  Surgeon: Theda Belfast, MD;  Location: [facility];  Service: Endoscopy;  Laterality: N/A;  . Toe surgery  05/31/2015   Family History  Problem Relation Age of Onset  . Kidney disease Mother   . Heart attack Mother   . Kidney disease Father   . Hypertension Mother   . Breast cancer Daughter   . Heart disease Brother   . Diabetes Brother   . Lung cancer Brother   . Hypertension Sister   . Hypertension Daughter    Social History  Substance Use Topics  . Smoking status: Current Every Day Smoker -- 0.15 packs/day for 45 years    Types: Cigarettes  . Smokeless tobacco: Current User     Comment: 4 cigarettes daily trying to quit  . Alcohol Use: No   OB History    No data available     Review of Systems  Constitutional: Negative for fever, chills, diaphoresis and fatigue.  HENT: Negative for  congestion, rhinorrhea and sneezing.   Eyes: Negative.   Respiratory: Negative for cough, chest tightness and shortness of breath.   Cardiovascular: Negative for chest pain and leg swelling.  Gastrointestinal: Positive for nausea and abdominal pain. Negative for vomiting, diarrhea and blood in stool.  Genitourinary: Negative for frequency, hematuria, flank pain and difficulty urinating.  Musculoskeletal: Negative for back pain and arthralgias.  Skin: Negative for rash.  Neurological: Negative for dizziness, speech difficulty, weakness, numbness and headaches.      Allergies  Levaquin and Penicillins  Home Medications   Prior to  Admission medications   Medication Sig Start Date End Date Taking? Authorizing Provider  albuterol (PROVENTIL) (2.5 MG/3ML) 0.083% nebulizer solution Take 2.5 mg by nebulization every 4 (four) hours as needed for wheezing or shortness of breath.    Yes Historical Provider, MD  amLODipine (NORVASC) 5 MG tablet Take 5 mg by mouth daily. 02/20/16  Yes Historical Provider, MD  Calcium Carb-Cholecalciferol (CALCIUM 600 + D) 600-200 MG-UNIT TABS Take 1 tablet by mouth daily.   Yes Historical Provider, MD  ferrous sulfate 325 (65 FE) MG tablet Take 1 tablet (325 mg total) by mouth 3 (three) times daily with meals. Patient taking differently: Take 325 mg by mouth daily with breakfast.  03/03/16  Yes Nyoka Cowden, MD  furosemide (LASIX) 40 MG tablet Take 1 tablet (40 mg total) by mouth daily. Patient taking differently: Take 40 mg by mouth every morning.  01/06/16  Yes Renae Fickle, MD  montelukast (SINGULAIR) 10 MG tablet Take 10 mg by mouth every evening. Reported on 02/28/2016   Yes Historical Provider, MD  ondansetron (ZOFRAN) 4 MG tablet Take 1 tablet (4 mg total) by mouth every 8 (eight) hours as needed for nausea or vomiting. 02/04/16  Yes Courteney Lyn Mackuen, MD  oxyCODONE-acetaminophen (PERCOCET) 7.5-325 MG tablet Take 1 tablet by mouth every 6 (six) hours.  12/19/15  Yes Historical Provider, MD  OXYGEN Oxygen 2 lpm rest and 3lpm with exertion [facility]   Yes Historical Provider, MD  pantoprazole (PROTONIX) 40 MG tablet Take 40 mg by mouth daily before breakfast.    Yes Historical Provider, MD  potassium chloride SA (K-DUR,KLOR-CON) 20 MEQ tablet Take 1 tablet (20 mEq total) by mouth 2 (two) times daily. 05/26/12  Yes Richarda Overlie, MD  pravastatin (PRAVACHOL) 20 MG tablet Take 20 mg by mouth daily.   Yes Historical Provider, MD  promethazine (PHENERGAN) 25 MG tablet Take 1 tablet (25 mg total) by mouth every 6 (six) hours as needed for nausea. 06/04/16  Yes Benjiman Core, MD   Triage Vitals: BP  133/92 mmHg  Pulse 71  Temp(Src) 97.8 F (36.6 C) (Oral)  Resp 17  SpO2 97%   Physical Exam  Constitutional: She is oriented to person, place, and time. She appears well-developed and well-nourished.  HENT:  Head: Normocephalic and atraumatic.  Eyes: Pupils are equal, round, and reactive to light.  Neck: Normal range of motion. Neck supple.  Cardiovascular: Normal rate, regular rhythm and normal heart sounds.   Pulmonary/Chest: Effort normal and breath sounds normal. No respiratory distress. She has no wheezes. She has no rales. She exhibits no tenderness.  Abdominal: Soft. Bowel sounds are normal. There is no tenderness. There is no rebound and no guarding.  Musculoskeletal: Normal range of motion. She exhibits no edema.  Lymphadenopathy:    She has no cervical adenopathy.  Neurological: She is alert and oriented to person, place, and time.  Skin: Skin is warm and  dry. No rash noted.  Psychiatric: She has a normal mood and affect.    ED Course  Procedures (including critical care time)  DIAGNOSTIC STUDIES: Oxygen Saturation is 86% on RA, low by my interpretation.    COORDINATION OF CARE: 3:20 AM- Will order CXR, blood work, EKG, and urinalysis. Discussed treatment plan with pt at bedside and pt agreed to plan.     Labs Review Results for orders placed or performed during the hospital encounter of 06/07/16  Basic metabolic panel  Result Value Ref Range   Sodium 135 135 - 145 mmol/L   Potassium 3.1 (L) 3.5 - 5.1 mmol/L   Chloride 103 101 - 111 mmol/L   CO2 22 22 - 32 mmol/L   Glucose, Bld 98 65 - 99 mg/dL   BUN 31 (H) 6 - 20 mg/dL   Creatinine, Ser 2.33 (H) 0.44 - 1.00 mg/dL   Calcium 9.1 8.9 - 61.2 mg/dL   GFR calc non Af Amer 31 (L) >60 mL/min   GFR calc Af Amer 36 (L) >60 mL/min   Anion gap 10 5 - 15  CBC  Result Value Ref Range   WBC 13.9 (H) 4.0 - 10.5 K/uL   RBC 6.57 (H) 3.87 - 5.11 MIL/uL   Hemoglobin 17.6 (H) 12.0 - 15.0 g/dL   HCT 24.4 (H) 97.5 - 30.0 %    MCV 79.8 78.0 - 100.0 fL   MCH 26.8 26.0 - 34.0 pg   MCHC 33.6 30.0 - 36.0 g/dL   RDW 51.1 (H) 02.1 - 11.7 %   Platelets 225 150 - 400 K/uL  Urinalysis, Routine w reflex microscopic  Result Value Ref Range   Color, Urine AMBER (A) YELLOW   APPearance CLOUDY (A) CLEAR   Specific Gravity, Urine 1.025 1.005 - 1.030   pH 5.5 5.0 - 8.0   Glucose, UA NEGATIVE NEGATIVE mg/dL   Hgb urine dipstick NEGATIVE NEGATIVE   Bilirubin Urine SMALL (A) NEGATIVE   Ketones, ur NEGATIVE NEGATIVE mg/dL   Protein, ur NEGATIVE NEGATIVE mg/dL   Nitrite NEGATIVE NEGATIVE   Leukocytes, UA SMALL (A) NEGATIVE  Urine microscopic-add on  Result Value Ref Range   Squamous Epithelial / LPF 0-5 (A) NONE SEEN   WBC, UA 0-5 0 - 5 WBC/hpf   RBC / HPF NONE SEEN 0 - 5 RBC/hpf   Bacteria, UA RARE (A) NONE SEEN   Casts HYALINE CASTS (A) NEGATIVE   Urine-Other MUCOUS PRESENT   CBG monitoring, ED  Result Value Ref Range   Glucose-Capillary 102 (H) 65 - 99 mg/dL  I-stat troponin, ED  Result Value Ref Range   Troponin i, poc 0.01 0.00 - 0.08 ng/mL   Comment 3           Dg Chest 2 View  06/07/2016  CLINICAL DATA:  65 year old female with cough EXAM: CHEST  2 VIEW COMPARISON:  Chest radiograph dated 06/03/2016 FINDINGS: There is emphysematous changes of the lung with interstitial coarsening. There bibasilar subsegmental and platelike atelectasis/scarring. There is no focal consolidation, pleural effusion, or pneumothorax. The cardiac silhouette is within normal limits. No acute osseous pathology. IMPRESSION: No active cardiopulmonary disease. Electronically Signed   By: Elgie Collard M.D.   On: 06/07/2016 04:08   Dg Chest 2 View  06/03/2016  CLINICAL DATA:  65 year old female with cough, nausea vomiting. EXAM: CHEST  2 VIEW COMPARISON:  Chest radiograph dated 02/28/2016 FINDINGS: Two views of the chest demonstrate emphysematous changes of the lungs. There bibasilar linear and platelike  atelectatic changes. There is mild  interstitial prominence likely related to underlying chronic interstitial lung disease. No focal consolidation, pleural effusion, or pneumothorax. Top-normal cardiac size. No acute osseous pathology. IMPRESSION: No active cardiopulmonary disease. Electronically Signed   By: Elgie Collard M.D.   On: 06/03/2016 21:41   Ct Renal Stone Study  06/03/2016  CLINICAL DATA:  Patient states she has had right flank/abdominal pain x1 week and today she began vomiting. Patient endorses nausea/vomiting and productive cough. EXAM: CT ABDOMEN AND PELVIS WITHOUT CONTRAST TECHNIQUE: Multidetector CT imaging of the abdomen and pelvis was performed following the standard protocol without IV contrast. COMPARISON:  02/04/2016 FINDINGS: Lower chest: Bilateral chronic interstitial lung disease with mild areas of fibrosis. Hepatobiliary: Normal liver.  Normal gallbladder. Pancreas: Normal. Spleen: Normal. Adrenals/Urinary Tract: No urolithiasis or obstructive uropathy. Normal adrenal glands. Normal bladder. Stomach/Bowel: No bowel wall thickening or bowel dilatation. Normal appendix. No pneumatosis, pneumoperitoneum or portal venous gas. No abdominal or pelvic free fluid. Vascular/Lymphatic: Normal caliber abdominal aorta with atherosclerosis. No lymphadenopathy. Reproductive: Normal uterus.  No adnexal mass. Other: No fluid collection or hematoma. Musculoskeletal: No acute osseous abnormality. No lytic or sclerotic osseous lesion. Degenerative disc disease with disc height loss at L5-S1 with bilateral facet arthropathy and foraminal narrowing. IMPRESSION: 1. No acute abdominal or pelvic pathology. 2. Normal appendix. 3. No urolithiasis or obstructive uropathy. Electronically Signed   By: Elige Ko   On: 06/03/2016 22:13      Imaging Review Dg Chest 2 View  06/07/2016  CLINICAL DATA:  65 year old female with cough EXAM: CHEST  2 VIEW COMPARISON:  Chest radiograph dated 06/03/2016 FINDINGS: There is emphysematous changes of  the lung with interstitial coarsening. There bibasilar subsegmental and platelike atelectasis/scarring. There is no focal consolidation, pleural effusion, or pneumothorax. The cardiac silhouette is within normal limits. No acute osseous pathology. IMPRESSION: No active cardiopulmonary disease. Electronically Signed   By: Elgie Collard M.D.   On: 06/07/2016 04:08   I have personally reviewed and evaluated these images and lab results as part of my medical decision-making.   EKG Interpretation   Date/Time:  Saturday June 07 2016 01:31:10 EDT Ventricular Rate:  69 PR Interval:    QRS Duration: 102 QT Interval:  591 QTC Calculation: 634 R Axis:   49 Text Interpretation:  Sinus rhythm Consider left atrial enlargement  Borderline repolarization abnormality Prolonged QT interval T wave  inversion diffusely Confirmed by Amisha Pospisil  MD, Rolland Steinert ([id number]) on 06/07/2016  5:19:25 AM      MDM   Final diagnoses:  Nausea  Elevated serum creatinine    Patient presents with altered mental status although during my evaluation she became completely alert and states that she's feeling fine and wants to go home. I suspect that her decreased mental status was related to her Phenergan. She has never taken Phenergan before. She states that she is not having any symptoms now. She has no nausea. No abdominal pain. No recent fevers, although his temp was 100 on arrival, but has normalized.  She has a mildly elevated white count but no other suggestions of infection. Her chest x-ray is normal. Her urine is not suggestive of infection. She has a mild elevation in her creatinine. She was given IV fluids. She's tolerating by mouth fluids and is anxious to be discharged. I encouraged her to follow-up with her PCP. I advised her she needs to have a recheck of her creatinine. Return precautions were given. I encouraged her not to use the Phenergan.  I personally performed the services described in this documentation, which  was scribed in my presence.  The recorded information has been reviewed and considered.   Rolan Bucco, MD 06/07/16 [phone]

## 2016-06-07 NOTE — ED Notes (Signed)
Bed: WA17 Expected date:  Expected time:  Means of arrival:  Comments: EMS nausea and vomiting/weakness/lethargic

## 2016-06-07 NOTE — ED Notes (Signed)
EKG given to EDP,Belfi,MD., for review. 

## 2016-07-10 ENCOUNTER — Encounter: Payer: Self-pay | Admitting: *Deleted

## 2016-07-10 ENCOUNTER — Ambulatory Visit: Payer: Medicare Other | Admitting: Internal Medicine

## 2016-07-10 DIAGNOSIS — R0989 Other specified symptoms and signs involving the circulatory and respiratory systems: Secondary | ICD-10-CM

## 2016-07-10 NOTE — Progress Notes (Signed)
Letter mailed

## 2016-08-27 ENCOUNTER — Ambulatory Visit (INDEPENDENT_AMBULATORY_CARE_PROVIDER_SITE_OTHER): Payer: Medicare Other | Admitting: Internal Medicine

## 2016-08-27 ENCOUNTER — Encounter: Payer: Self-pay | Admitting: Internal Medicine

## 2016-08-27 VITALS — BP 148/74 | HR 52 | Ht 66.0 in | Wt 201.0 lb

## 2016-08-27 DIAGNOSIS — I10 Essential (primary) hypertension: Secondary | ICD-10-CM

## 2016-08-27 DIAGNOSIS — I5033 Acute on chronic diastolic (congestive) heart failure: Secondary | ICD-10-CM

## 2016-08-27 DIAGNOSIS — I480 Paroxysmal atrial fibrillation: Secondary | ICD-10-CM | POA: Diagnosis not present

## 2016-08-27 NOTE — Patient Instructions (Signed)
Medication Instructions:  Your physician recommends that you continue on your current medications as directed. Please refer to the Current Medication list given to you today.  Labwork: None   Testing/Procedures: None   Follow-Up: Your physician wants you to follow-up in: 12 months with Dr Hilty. You will receive a reminder letter in the mail two months in advance. If you don't receive a letter, please call our office to schedule the follow-up appointment.  Any Other Special Instructions Will Be Listed Below (If Applicable).     If you need a refill on your cardiac medications before your next appointment, please call your pharmacy.  

## 2016-08-27 NOTE — Progress Notes (Signed)
OFFICE NOTE  Chief Complaint:  Follow-up anticoagulation  Primary Care Physician: [name], MD  HPI:  [name] is a pleasant 65 year old female who I saw remotely in 2012. Subsequently she followed up with another cardiologist in town, Dr. Jacinto Halim in 2015. She presents today's a new patient having not been seen in the last 5 years by myself. Ms. [name] was previously seen in the hospital for acute renal failure, nausea, vomiting, anemia and hypokalemia. At the time she had atrial fibrillation and flutter and she was previously on warfarin however that was stopped due to significant GI bleeding. She was started by my partners on amiodarone for rhythm control as well as metoprolol. I had scheduled her for pulmonary function, liver and thyroid testing. This was in April 2012. She had a recurrent visit scheduled for 07/30/2011 and did not show for that visit. Was lost to follow-up to our office. According to hospital records she was seen in 2015 where she had recurrent atrial fibrillation. She was not on amiodarone at the time. Dr. Jacinto Halim treated her and started her on amiodarone. He did not feel that she was a candidate for warfarin based on her history of GI bleeding. Recently she's had some progressive shortness of breath and was seen by Dr. Sherene Sires and found to have pulmonary fibrosis, suspected to be related to amiodarone. She has maintained sinus rhythm. Her CHADSVASC score is 4. She has been on oxygen therapy for her already fibrosis. She denies any chest pain.  04/08/2016  [name] returns today for follow-up. Previously we had started her on reduced dose Xarelto 15 mg daily because of her reduced renal function. It was felt that she might tolerate this with decreased bleeding likelihood. Unfortunately, she has had progressive anemia which is microcytic suggesting possible GI bleeding. Her pulmonologist discontinue the Xarelto and she is currently awaiting a GI evaluation  which is in early June.  08/27/2016  [name] was seen today in follow-up. She was seen by Dr. Adela Lank over the summer and evaluation for microcytic anemia. There was concern of ongoing GI bleeding however she did not submit to colonoscopy or endoscopy. He advised that she follow-up with me regarding her anticoagulation. In the interim her hemoglobin did improve. We discussed anticoagulation again today due to her history of A. fib and CHADSVASC score 4, however she is not interested in anticoagulation because of concerns of bleeding risk. She understands that she is at high risk for stroke at least 5% annually.  PMHx:  Past Medical History:  Diagnosis Date  . Anemia   . Anxiety   . Arthritis    knees  . AVM (arteriovenous malformation) of small bowel, acquired (HCC)   . Borderline diabetes   . CHF (congestive heart failure) (HCC)   . Chronic respiratory failure (HCC)   . CKD (chronic kidney disease) stage 3, GFR 30-59 ml/min   . Depression   . Dysrhythmia    A-fib  . GERD (gastroesophageal reflux disease)   . Headache(784.0)   . Hyperlipidemia   . Hypertension   . Irregular heart beat   . Shortness of breath    on exertion    Past Surgical History:  Procedure Laterality Date  . COLONOSCOPY N/A 02/25/2013   Procedure: COLONOSCOPY;  Surgeon: Theda Belfast, MD;  Location: [facility];  Service: Endoscopy;  Laterality: N/A;  . ESOPHAGOGASTRODUODENOSCOPY N/A 02/25/2013   Procedure: ESOPHAGOGASTRODUODENOSCOPY (EGD);  Surgeon: Theda Belfast, MD;  Location: [facility];  Service: Endoscopy;  Laterality: N/A;  . GIVENS CAPSULE STUDY N/A 02/26/2013   Procedure: GIVENS CAPSULE STUDY;  Surgeon: Theda Belfast, MD;  Location: [facility];  Service: Endoscopy;  Laterality: N/A;  . HEMORRHOID SURGERY    . TOE SURGERY  05/31/2015    FAMHx:  Family History  Problem Relation Age of Onset  . Kidney disease Mother   . Heart attack Mother   . Hypertension Mother   . Kidney disease  Father   . Breast cancer Daughter   . Heart disease Brother   . Diabetes Brother   . Lung cancer Brother   . Hypertension Sister   . Hypertension Daughter     SOCHx:   reports that she has been smoking Cigarettes.  She has a 6.75 pack-year smoking history. She uses smokeless tobacco. She reports that she uses drugs, including Marijuana. She reports that she does not drink alcohol.  ALLERGIES:  Allergies  Allergen Reactions  . Levaquin [Levofloxacin] Hives  . Penicillins Swelling    Has patient had a PCN reaction causing immediate rash, facial/tongue/throat swelling, SOB or lightheadedness with hypotension yes Has patient had a PCN reaction causing severe rash involving mucus membranes or skin necrosis: unknown Has patient had a PCN reaction that required hospitalization yes Has patient had a PCN reaction occurring within the last 10 years: no If all of the above answers are "NO", then may proceed with Cephalosporin use.     ROS: Pertinent items noted in HPI and remainder of comprehensive ROS otherwise negative.  HOME MEDS: Current Outpatient Prescriptions  Medication Sig Dispense Refill  . amLODipine (NORVASC) 5 MG tablet Take 5 mg by mouth daily.  0  . Calcium Carb-Cholecalciferol (CALCIUM 600 + D) 600-200 MG-UNIT TABS Take 1 tablet by mouth daily.    . ferrous sulfate 325 (65 FE) MG tablet Take 1 tablet (325 mg total) by mouth 3 (three) times daily with meals. (Patient taking differently: Take 325 mg by mouth daily with breakfast. ) 90 tablet 5  . furosemide (LASIX) 40 MG tablet Take 1 tablet (40 mg total) by mouth daily. (Patient taking differently: Take 40 mg by mouth every morning. ) 30 tablet 0  . montelukast (SINGULAIR) 10 MG tablet Take 10 mg by mouth every evening. Reported on 02/28/2016    . ondansetron (ZOFRAN) 4 MG tablet Take 1 tablet (4 mg total) by mouth every 8 (eight) hours as needed for nausea or vomiting. 11 tablet 0  . oxyCODONE-acetaminophen (PERCOCET) 7.5-325  MG tablet Take 1 tablet by mouth every 6 (six) hours.   0  . OXYGEN Oxygen 2 lpm rest and 3lpm with exertion [facility]    . pantoprazole (PROTONIX) 40 MG tablet Take 40 mg by mouth daily before breakfast.     . potassium chloride SA (K-DUR,KLOR-CON) 20 MEQ tablet Take 1 tablet (20 mEq total) by mouth 2 (two) times daily. 60 tablet 0  . pravastatin (PRAVACHOL) 20 MG tablet Take 20 mg by mouth daily.    . promethazine (PHENERGAN) 25 MG tablet Take 1 tablet (25 mg total) by mouth every 6 (six) hours as needed for nausea. 10 tablet 0   No current facility-administered medications for this visit.     LABS/IMAGING: No results found for this or any previous visit (from the past 48 hour(s)). No results found.  WEIGHTS: Wt Readings from Last 3 Encounters:  08/27/16 201 lb (91.2 kg)  06/03/16 200 lb (90.7 kg)  05/12/16 200 lb (90.7 kg)    VITALS: BP Marland Kitchen)  148/74   Pulse (!) 52   Ht 5\' 6"  (1.676 m)   Wt 201 lb (91.2 kg)   BMI 32.44 kg/m   EXAM: General appearance: alert and no distress Lungs: clear to auscultation bilaterally Heart: regular rate and rhythm, S1, S2 normal, no murmur, click, rub or gallop Extremities: extremities normal, atraumatic, no cyanosis or edema Neurologic: Grossly normal  EKG: Sinus bradycardia 52  ASSESSMENT: 1. Paroxysmal atrial fibrillation -  history of warfarin coagulopathy and GI bleeding, recently on Xarelto 2. CHADSVASC score of 4 3. 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 Dyslipidemia 5. Microcytic anemia  PLAN: 1.   [name] refused repeat workup for microcytic anemia by GI. She is also refusing anticoagulation because of her recent anemia. She understands she is at increased risk of stroke however excepts that risk by not going on medication. I advised her to contact me if she would be interested in going on to R isn't some point in the future, but she would need to undergo a GI workup.  Talmadge Coventry, MD, [facility] Attending  Cardiologist [facility]  [facility] 08/27/2016, 5:14 PM

## 2016-09-15 ENCOUNTER — Telehealth: Payer: Self-pay | Admitting: Internal Medicine

## 2016-09-15 NOTE — Telephone Encounter (Signed)
Pt's having tooth removed-Dr. Romie Levee fax [phone] office [phone], need ok to proceed

## 2016-09-15 NOTE — Telephone Encounter (Signed)
Left msg at Dr. Thomasene Lot office informing of recommendations for single tooth extraction, and to call back if needed.

## 2016-11-19 ENCOUNTER — Ambulatory Visit (HOSPITAL_COMMUNITY)
Admission: EM | Admit: 2016-11-19 | Discharge: 2016-11-19 | Disposition: A | Discharge status: Home / Self Care | Payer: Medicare Other | Attending: Family Medicine | Admitting: Family Medicine

## 2016-11-19 ENCOUNTER — Encounter (HOSPITAL_COMMUNITY): Payer: Self-pay | Admitting: Emergency Medicine

## 2016-11-19 DIAGNOSIS — R110 Nausea: Secondary | ICD-10-CM | POA: Diagnosis not present

## 2016-11-19 MED ORDER — ONDANSETRON HCL 4 MG PO TABS: 4.0000 mg | ORAL_TABLET | Freq: Three times a day (TID) | ORAL | 0 refills | Status: AC | PRN

## 2016-11-19 NOTE — ED Triage Notes (Signed)
Pt would like a refill of her zofran script. Her PCP is closed for the holidays

## 2016-11-19 NOTE — ED Provider Notes (Signed)
CSN: [id number]     Arrival date & time 11/19/16  1231 History   First MD Initiated Contact with Patient 11/19/16 1357     Chief Complaint  Patient presents with  . Medication Refill   (Consider location/radiation/quality/duration/timing/severity/associated sxs/prior Treatment) Patient is a well-appearing 65 y.o. Female, with several medical histories including CHF, HTN, Paroxysmal afib, HLD, and more, presents today with her empty prescription bottle, requesting medication refill for her Zofran. PCP office is close today and patient was advised to come here for refill. Patient reports that she has chronic nausea for many years of unknown etiology. Patient reports to take Zofran every morning to prevent nausea. Patient reports her nausea is well controlled with Zofran. Patient denies vomiting or abdominal pain. Promethazine is noted on her medication list; patient reports that she is not taking the promethazine.        Past Medical History:  Diagnosis Date  . Anemia   . Anxiety   . Arthritis    knees  . AVM (arteriovenous malformation) of small bowel, acquired (HCC)   . Borderline diabetes   . CHF (congestive heart failure) (HCC)   . Chronic respiratory failure (HCC)   . CKD (chronic kidney disease) stage 3, GFR 30-59 ml/min   . Depression   . Dysrhythmia    A-fib  . GERD (gastroesophageal reflux disease)   . Headache(784.0)   . Hyperlipidemia   . Hypertension   . Irregular heart beat   . Shortness of breath    on exertion   Past Surgical History:  Procedure Laterality Date  . COLONOSCOPY N/A 02/25/2013   Procedure: COLONOSCOPY;  Surgeon: Theda Belfast, MD;  Location: [facility];  Service: Endoscopy;  Laterality: N/A;  . ESOPHAGOGASTRODUODENOSCOPY N/A 02/25/2013   Procedure: ESOPHAGOGASTRODUODENOSCOPY (EGD);  Surgeon: Theda Belfast, MD;  Location: [facility];  Service: Endoscopy;  Laterality: N/A;  . GIVENS CAPSULE STUDY N/A 02/26/2013   Procedure: GIVENS CAPSULE STUDY;   Surgeon: Theda Belfast, MD;  Location: [facility];  Service: Endoscopy;  Laterality: N/A;  . HEMORRHOID SURGERY    . TOE SURGERY  05/31/2015   Family History  Problem Relation Age of Onset  . Kidney disease Mother   . Heart attack Mother   . Hypertension Mother   . Kidney disease Father   . Breast cancer Daughter   . Heart disease Brother   . Diabetes Brother   . Lung cancer Brother   . Hypertension Sister   . Hypertension Daughter    Social History  Substance Use Topics  . Smoking status: Current Every Day Smoker    Packs/day: 0.15    Years: 45.00    Types: Cigarettes  . Smokeless tobacco: Current User     Comment: 4 cigarettes daily trying to quit  . Alcohol use No   OB History    No data available     Review of Systems  Constitutional: Negative for fatigue, fever and unexpected weight change.  Respiratory: Negative.   Cardiovascular: Negative.   Gastrointestinal: Positive for nausea. Negative for abdominal pain and vomiting.  Neurological: Negative for dizziness and headaches.    Allergies  Levaquin [levofloxacin] and Penicillins  Home Medications   Prior to Admission medications   Medication Sig Start Date End Date Taking? Authorizing Provider  amLODipine (NORVASC) 5 MG tablet Take 5 mg by mouth daily. 02/20/16   Historical Provider, MD  Calcium Carb-Cholecalciferol (CALCIUM 600 + D) 600-200 MG-UNIT TABS Take 1 tablet by mouth daily.  Historical Provider, MD  ferrous sulfate 325 (65 FE) MG tablet Take 1 tablet (325 mg total) by mouth 3 (three) times daily with meals. Patient taking differently: Take 325 mg by mouth daily with breakfast.  03/03/16   Nyoka Cowden, MD  furosemide (LASIX) 40 MG tablet Take 1 tablet (40 mg total) by mouth daily. Patient taking differently: Take 40 mg by mouth every morning.  01/06/16   Renae Fickle, MD  montelukast (SINGULAIR) 10 MG tablet Take 10 mg by mouth every evening. Reported on 02/28/2016    Historical Provider, MD   ondansetron (ZOFRAN) 4 MG tablet Take 1 tablet (4 mg total) by mouth every 8 (eight) hours as needed for nausea or vomiting. 11/19/16 12/19/16  Lucia Estelle, NP  oxyCODONE-acetaminophen (PERCOCET) 7.5-325 MG tablet Take 1 tablet by mouth every 6 (six) hours.  12/19/15   Historical Provider, MD  OXYGEN Oxygen 2 lpm rest and 3lpm with exertion [facility]    Historical Provider, MD  pantoprazole (PROTONIX) 40 MG tablet Take 40 mg by mouth daily before breakfast.     Historical Provider, MD  potassium chloride SA (K-DUR,KLOR-CON) 20 MEQ tablet Take 1 tablet (20 mEq total) by mouth 2 (two) times daily. 05/26/12   Richarda Overlie, MD  pravastatin (PRAVACHOL) 20 MG tablet Take 20 mg by mouth daily.    Historical Provider, MD  promethazine (PHENERGAN) 25 MG tablet Take 1 tablet (25 mg total) by mouth every 6 (six) hours as needed for nausea. 06/04/16   Benjiman Core, MD   Meds Ordered and Administered this Visit  Medications - No data to display  BP 138/79   Pulse 74   Temp 98.1 F (36.7 C) (Oral)   Resp 16   Ht 5\' 6"  (1.676 m)   Wt 202 lb (91.6 kg)   SpO2 97%   BMI 32.60 kg/m  No data found.   Physical Exam  Constitutional: She is oriented to person, place, and time. She appears well-developed and well-nourished.  HENT:  Head: Normocephalic and atraumatic.  Eyes: EOM are normal. Pupils are equal, round, and reactive to light.  Neck: Normal range of motion. Neck supple.  Cardiovascular: Normal rate and normal heart sounds.   Irregular rhythm noted  Pulmonary/Chest: Effort normal and breath sounds normal.  Abdominal: Soft. Bowel sounds are normal. She exhibits no distension. There is no tenderness.  Neurological: She is alert and oriented to person, place, and time.  Skin: Skin is warm and dry.  Nursing note and vitals reviewed.   Urgent Care Course   Clinical Course     Procedures (including critical care time)  Labs Review Labs Reviewed - No data to display  Imaging Review No  results found.  MDM   1. Nausea    Patient has chronic nausea without a clear etiology. Her next appt with PCP is 01/09. Patient also has a cardiologist that she sees regularly. Patient is aware of her irregular heart rhythm.   Refill for Zofran given today per request. Patient informed to f/u with PCP and cardiologist as scheduled.     03/09, NP 11/19/16 1421

## 2017-01-15 DIAGNOSIS — J449 Chronic obstructive pulmonary disease, unspecified: Secondary | ICD-10-CM | POA: Diagnosis not present

## 2017-01-15 DIAGNOSIS — H538 Other visual disturbances: Secondary | ICD-10-CM | POA: Diagnosis not present

## 2017-01-15 DIAGNOSIS — E119 Type 2 diabetes mellitus without complications: Secondary | ICD-10-CM | POA: Diagnosis not present

## 2017-01-15 DIAGNOSIS — E785 Hyperlipidemia, unspecified: Secondary | ICD-10-CM | POA: Diagnosis not present

## 2017-01-15 DIAGNOSIS — Z5181 Encounter for therapeutic drug level monitoring: Secondary | ICD-10-CM | POA: Diagnosis not present

## 2017-01-15 DIAGNOSIS — Z0000 Encounter for general adult medical examination without abnormal findings: Secondary | ICD-10-CM | POA: Diagnosis not present

## 2017-01-20 DIAGNOSIS — Z79891 Long term (current) use of opiate analgesic: Secondary | ICD-10-CM | POA: Diagnosis not present

## 2017-01-20 DIAGNOSIS — G894 Chronic pain syndrome: Secondary | ICD-10-CM | POA: Diagnosis not present

## 2017-01-20 DIAGNOSIS — M25561 Pain in right knee: Secondary | ICD-10-CM | POA: Diagnosis not present

## 2017-01-20 DIAGNOSIS — M25562 Pain in left knee: Secondary | ICD-10-CM | POA: Diagnosis not present

## 2017-01-29 DIAGNOSIS — M25571 Pain in right ankle and joints of right foot: Secondary | ICD-10-CM | POA: Diagnosis not present

## 2017-01-29 DIAGNOSIS — M76821 Posterior tibial tendinitis, right leg: Secondary | ICD-10-CM | POA: Diagnosis not present

## 2017-02-16 ENCOUNTER — Emergency Department (HOSPITAL_COMMUNITY): Payer: Medicare Other

## 2017-02-16 ENCOUNTER — Inpatient Hospital Stay (HOSPITAL_COMMUNITY): Payer: Medicare Other

## 2017-02-16 ENCOUNTER — Inpatient Hospital Stay (HOSPITAL_COMMUNITY)
Admission: EM | Admit: 2017-02-16 | Discharge: 2017-03-24 | DRG: 264 | Disposition: E | Payer: Medicare Other | Attending: Emergency Medicine | Admitting: Emergency Medicine

## 2017-02-16 ENCOUNTER — Encounter (HOSPITAL_COMMUNITY): Payer: Self-pay | Admitting: Emergency Medicine

## 2017-02-16 DIAGNOSIS — N289 Disorder of kidney and ureter, unspecified: Secondary | ICD-10-CM | POA: Diagnosis not present

## 2017-02-16 DIAGNOSIS — T460X5A Adverse effect of cardiac-stimulant glycosides and drugs of similar action, initial encounter: Secondary | ICD-10-CM | POA: Diagnosis not present

## 2017-02-16 DIAGNOSIS — Z9289 Personal history of other medical treatment: Secondary | ICD-10-CM

## 2017-02-16 DIAGNOSIS — Z8249 Family history of ischemic heart disease and other diseases of the circulatory system: Secondary | ICD-10-CM

## 2017-02-16 DIAGNOSIS — J90 Pleural effusion, not elsewhere classified: Secondary | ICD-10-CM | POA: Diagnosis not present

## 2017-02-16 DIAGNOSIS — N183 Chronic kidney disease, stage 3 unspecified: Secondary | ICD-10-CM | POA: Diagnosis present

## 2017-02-16 DIAGNOSIS — N189 Chronic kidney disease, unspecified: Secondary | ICD-10-CM

## 2017-02-16 DIAGNOSIS — I4901 Ventricular fibrillation: Secondary | ICD-10-CM | POA: Diagnosis not present

## 2017-02-16 DIAGNOSIS — R740 Nonspecific elevation of levels of transaminase and lactic acid dehydrogenase [LDH]: Secondary | ICD-10-CM | POA: Diagnosis not present

## 2017-02-16 DIAGNOSIS — J9690 Respiratory failure, unspecified, unspecified whether with hypoxia or hypercapnia: Secondary | ICD-10-CM | POA: Diagnosis not present

## 2017-02-16 DIAGNOSIS — K219 Gastro-esophageal reflux disease without esophagitis: Secondary | ICD-10-CM | POA: Diagnosis present

## 2017-02-16 DIAGNOSIS — I10 Essential (primary) hypertension: Secondary | ICD-10-CM | POA: Diagnosis not present

## 2017-02-16 DIAGNOSIS — I700 Atherosclerosis of aorta: Secondary | ICD-10-CM | POA: Diagnosis not present

## 2017-02-16 DIAGNOSIS — T4275XA Adverse effect of unspecified antiepileptic and sedative-hypnotic drugs, initial encounter: Secondary | ICD-10-CM | POA: Diagnosis not present

## 2017-02-16 DIAGNOSIS — F17210 Nicotine dependence, cigarettes, uncomplicated: Secondary | ICD-10-CM | POA: Diagnosis present

## 2017-02-16 DIAGNOSIS — J984 Other disorders of lung: Secondary | ICD-10-CM

## 2017-02-16 DIAGNOSIS — J9601 Acute respiratory failure with hypoxia: Secondary | ICD-10-CM | POA: Diagnosis not present

## 2017-02-16 DIAGNOSIS — Z4659 Encounter for fitting and adjustment of other gastrointestinal appliance and device: Secondary | ICD-10-CM

## 2017-02-16 DIAGNOSIS — Z880 Allergy status to penicillin: Secondary | ICD-10-CM

## 2017-02-16 DIAGNOSIS — J811 Chronic pulmonary edema: Secondary | ICD-10-CM | POA: Diagnosis not present

## 2017-02-16 DIAGNOSIS — I4581 Long QT syndrome: Secondary | ICD-10-CM | POA: Diagnosis not present

## 2017-02-16 DIAGNOSIS — I442 Atrioventricular block, complete: Secondary | ICD-10-CM | POA: Diagnosis not present

## 2017-02-16 DIAGNOSIS — E669 Obesity, unspecified: Secondary | ICD-10-CM | POA: Diagnosis present

## 2017-02-16 DIAGNOSIS — R55 Syncope and collapse: Secondary | ICD-10-CM

## 2017-02-16 DIAGNOSIS — Z881 Allergy status to other antibiotic agents status: Secondary | ICD-10-CM

## 2017-02-16 DIAGNOSIS — I509 Heart failure, unspecified: Secondary | ICD-10-CM

## 2017-02-16 DIAGNOSIS — K5900 Constipation, unspecified: Secondary | ICD-10-CM | POA: Diagnosis present

## 2017-02-16 DIAGNOSIS — I480 Paroxysmal atrial fibrillation: Secondary | ICD-10-CM | POA: Diagnosis present

## 2017-02-16 DIAGNOSIS — J189 Pneumonia, unspecified organism: Secondary | ICD-10-CM | POA: Diagnosis not present

## 2017-02-16 DIAGNOSIS — T501X5A Adverse effect of loop [high-ceiling] diuretics, initial encounter: Secondary | ICD-10-CM | POA: Diagnosis present

## 2017-02-16 DIAGNOSIS — I4721 Torsades de pointes: Secondary | ICD-10-CM

## 2017-02-16 DIAGNOSIS — I5043 Acute on chronic combined systolic (congestive) and diastolic (congestive) heart failure: Secondary | ICD-10-CM | POA: Diagnosis present

## 2017-02-16 DIAGNOSIS — R079 Chest pain, unspecified: Secondary | ICD-10-CM

## 2017-02-16 DIAGNOSIS — G92 Toxic encephalopathy: Secondary | ICD-10-CM | POA: Diagnosis not present

## 2017-02-16 DIAGNOSIS — M069 Rheumatoid arthritis, unspecified: Secondary | ICD-10-CM | POA: Diagnosis present

## 2017-02-16 DIAGNOSIS — I462 Cardiac arrest due to underlying cardiac condition: Secondary | ICD-10-CM | POA: Diagnosis not present

## 2017-02-16 DIAGNOSIS — N179 Acute kidney failure, unspecified: Secondary | ICD-10-CM | POA: Diagnosis not present

## 2017-02-16 DIAGNOSIS — J9600 Acute respiratory failure, unspecified whether with hypoxia or hypercapnia: Secondary | ICD-10-CM | POA: Diagnosis not present

## 2017-02-16 DIAGNOSIS — S42302A Unspecified fracture of shaft of humerus, left arm, initial encounter for closed fracture: Secondary | ICD-10-CM

## 2017-02-16 DIAGNOSIS — J9621 Acute and chronic respiratory failure with hypoxia: Secondary | ICD-10-CM | POA: Diagnosis present

## 2017-02-16 DIAGNOSIS — E871 Hypo-osmolality and hyponatremia: Secondary | ICD-10-CM | POA: Diagnosis not present

## 2017-02-16 DIAGNOSIS — R042 Hemoptysis: Secondary | ICD-10-CM | POA: Diagnosis present

## 2017-02-16 DIAGNOSIS — Z6834 Body mass index (BMI) 34.0-34.9, adult: Secondary | ICD-10-CM

## 2017-02-16 DIAGNOSIS — I959 Hypotension, unspecified: Secondary | ICD-10-CM | POA: Diagnosis not present

## 2017-02-16 DIAGNOSIS — E1122 Type 2 diabetes mellitus with diabetic chronic kidney disease: Secondary | ICD-10-CM | POA: Diagnosis present

## 2017-02-16 DIAGNOSIS — E876 Hypokalemia: Secondary | ICD-10-CM | POA: Diagnosis present

## 2017-02-16 DIAGNOSIS — Z841 Family history of disorders of kidney and ureter: Secondary | ICD-10-CM

## 2017-02-16 DIAGNOSIS — I272 Pulmonary hypertension, unspecified: Secondary | ICD-10-CM | POA: Diagnosis present

## 2017-02-16 DIAGNOSIS — D72829 Elevated white blood cell count, unspecified: Secondary | ICD-10-CM | POA: Diagnosis present

## 2017-02-16 DIAGNOSIS — Z79891 Long term (current) use of opiate analgesic: Secondary | ICD-10-CM

## 2017-02-16 DIAGNOSIS — Y95 Nosocomial condition: Secondary | ICD-10-CM | POA: Diagnosis not present

## 2017-02-16 DIAGNOSIS — Z4682 Encounter for fitting and adjustment of non-vascular catheter: Secondary | ICD-10-CM | POA: Diagnosis not present

## 2017-02-16 DIAGNOSIS — J471 Bronchiectasis with (acute) exacerbation: Secondary | ICD-10-CM | POA: Diagnosis not present

## 2017-02-16 DIAGNOSIS — M170 Bilateral primary osteoarthritis of knee: Secondary | ICD-10-CM | POA: Diagnosis present

## 2017-02-16 DIAGNOSIS — Z833 Family history of diabetes mellitus: Secondary | ICD-10-CM

## 2017-02-16 DIAGNOSIS — R0789 Other chest pain: Secondary | ICD-10-CM | POA: Diagnosis not present

## 2017-02-16 DIAGNOSIS — R0600 Dyspnea, unspecified: Secondary | ICD-10-CM | POA: Diagnosis present

## 2017-02-16 DIAGNOSIS — Z66 Do not resuscitate: Secondary | ICD-10-CM | POA: Diagnosis not present

## 2017-02-16 DIAGNOSIS — J449 Chronic obstructive pulmonary disease, unspecified: Secondary | ICD-10-CM | POA: Diagnosis present

## 2017-02-16 DIAGNOSIS — I130 Hypertensive heart and chronic kidney disease with heart failure and stage 1 through stage 4 chronic kidney disease, or unspecified chronic kidney disease: Secondary | ICD-10-CM | POA: Diagnosis not present

## 2017-02-16 DIAGNOSIS — I469 Cardiac arrest, cause unspecified: Secondary | ICD-10-CM | POA: Diagnosis not present

## 2017-02-16 DIAGNOSIS — I481 Persistent atrial fibrillation: Secondary | ICD-10-CM | POA: Diagnosis present

## 2017-02-16 DIAGNOSIS — Z79899 Other long term (current) drug therapy: Secondary | ICD-10-CM

## 2017-02-16 DIAGNOSIS — I472 Ventricular tachycardia: Secondary | ICD-10-CM

## 2017-02-16 DIAGNOSIS — R7989 Other specified abnormal findings of blood chemistry: Secondary | ICD-10-CM | POA: Diagnosis present

## 2017-02-16 DIAGNOSIS — S2249XA Multiple fractures of ribs, unspecified side, initial encounter for closed fracture: Secondary | ICD-10-CM

## 2017-02-16 DIAGNOSIS — F209 Schizophrenia, unspecified: Secondary | ICD-10-CM | POA: Diagnosis present

## 2017-02-16 DIAGNOSIS — I4891 Unspecified atrial fibrillation: Secondary | ICD-10-CM | POA: Diagnosis present

## 2017-02-16 DIAGNOSIS — E785 Hyperlipidemia, unspecified: Secondary | ICD-10-CM | POA: Diagnosis present

## 2017-02-16 DIAGNOSIS — I5033 Acute on chronic diastolic (congestive) heart failure: Secondary | ICD-10-CM | POA: Diagnosis not present

## 2017-02-16 DIAGNOSIS — Z9981 Dependence on supplemental oxygen: Secondary | ICD-10-CM | POA: Diagnosis not present

## 2017-02-16 DIAGNOSIS — S2220XA Unspecified fracture of sternum, initial encounter for closed fracture: Secondary | ICD-10-CM

## 2017-02-16 DIAGNOSIS — Z01818 Encounter for other preprocedural examination: Secondary | ICD-10-CM

## 2017-02-16 DIAGNOSIS — E873 Alkalosis: Secondary | ICD-10-CM | POA: Diagnosis not present

## 2017-02-16 DIAGNOSIS — Z9114 Patient's other noncompliance with medication regimen: Secondary | ICD-10-CM | POA: Diagnosis not present

## 2017-02-16 DIAGNOSIS — F319 Bipolar disorder, unspecified: Secondary | ICD-10-CM | POA: Diagnosis present

## 2017-02-16 DIAGNOSIS — D539 Nutritional anemia, unspecified: Secondary | ICD-10-CM | POA: Diagnosis present

## 2017-02-16 DIAGNOSIS — J8410 Pulmonary fibrosis, unspecified: Secondary | ICD-10-CM

## 2017-02-16 DIAGNOSIS — R0602 Shortness of breath: Secondary | ICD-10-CM | POA: Diagnosis not present

## 2017-02-16 DIAGNOSIS — K769 Liver disease, unspecified: Secondary | ICD-10-CM | POA: Diagnosis not present

## 2017-02-16 DIAGNOSIS — R000 Tachycardia, unspecified: Secondary | ICD-10-CM | POA: Diagnosis not present

## 2017-02-16 DIAGNOSIS — R092 Respiratory arrest: Secondary | ICD-10-CM | POA: Diagnosis not present

## 2017-02-16 DIAGNOSIS — F40240 Claustrophobia: Secondary | ICD-10-CM | POA: Diagnosis present

## 2017-02-16 DIAGNOSIS — I2609 Other pulmonary embolism with acute cor pulmonale: Secondary | ICD-10-CM | POA: Diagnosis present

## 2017-02-16 DIAGNOSIS — J479 Bronchiectasis, uncomplicated: Secondary | ICD-10-CM

## 2017-02-16 DIAGNOSIS — T462X5A Adverse effect of other antidysrhythmic drugs, initial encounter: Secondary | ICD-10-CM | POA: Diagnosis not present

## 2017-02-16 DIAGNOSIS — S42301A Unspecified fracture of shaft of humerus, right arm, initial encounter for closed fracture: Secondary | ICD-10-CM

## 2017-02-16 HISTORY — DX: Pulmonary fibrosis, unspecified: J84.10

## 2017-02-16 HISTORY — DX: Chronic diastolic (congestive) heart failure: I50.32

## 2017-02-16 HISTORY — DX: Paroxysmal atrial fibrillation: I48.0

## 2017-02-16 LAB — CBC
HEMATOCRIT: 47.7 % — AB (ref 36.0–46.0)
Hemoglobin: 16.2 g/dL — ABNORMAL HIGH (ref 12.0–15.0)
MCH: 27.7 pg (ref 26.0–34.0)
MCHC: 34 g/dL (ref 30.0–36.0)
MCV: 81.7 fL (ref 78.0–100.0)
PLATELETS: 270 10*3/uL (ref 150–400)
RBC: 5.84 MIL/uL — ABNORMAL HIGH (ref 3.87–5.11)
RDW: 15.6 % — AB (ref 11.5–15.5)
WBC: 14.9 10*3/uL — AB (ref 4.0–10.5)

## 2017-02-16 LAB — BLOOD GAS, ARTERIAL
Acid-base deficit: 1 mmol/L (ref 0.0–2.0)
Bicarbonate: 22.6 mmol/L (ref 20.0–28.0)
DRAWN BY: 422461
O2 Content: 12 L/min
O2 Saturation: 92.2 %
PATIENT TEMPERATURE: 98.4
PH ART: 7.414 (ref 7.350–7.450)
pCO2 arterial: 35.9 mmHg (ref 32.0–48.0)
pO2, Arterial: 69.8 mmHg — ABNORMAL LOW (ref 83.0–108.0)

## 2017-02-16 LAB — MRSA PCR SCREENING: MRSA by PCR: NEGATIVE

## 2017-02-16 LAB — BASIC METABOLIC PANEL
Anion gap: 13 (ref 5–15)
BUN: 24 mg/dL — AB (ref 6–20)
CALCIUM: 8.8 mg/dL — AB (ref 8.9–10.3)
CO2: 16 mmol/L — ABNORMAL LOW (ref 22–32)
Chloride: 105 mmol/L (ref 101–111)
Creatinine, Ser: 1.39 mg/dL — ABNORMAL HIGH (ref 0.44–1.00)
GFR calc Af Amer: 45 mL/min — ABNORMAL LOW (ref >60)
GFR, EST NON AFRICAN AMERICAN: 39 mL/min — AB (ref >60)
Glucose, Bld: 135 mg/dL — ABNORMAL HIGH (ref 65–99)
POTASSIUM: 4 mmol/L (ref 3.5–5.1)
SODIUM: 134 mmol/L — AB (ref 135–145)

## 2017-02-16 LAB — BRAIN NATRIURETIC PEPTIDE: B NATRIURETIC PEPTIDE 5: 501.5 pg/mL — AB (ref 0.0–100.0)

## 2017-02-16 LAB — TROPONIN I: TROPONIN I: 0.03 ng/mL — AB (ref <0.03)

## 2017-02-16 MED ORDER — DIGOXIN 0.25 MG/ML IJ SOLN
0.2500 mg | Freq: Four times a day (QID) | INTRAMUSCULAR | Status: AC
Start: 2017-02-16 — End: 2017-02-17
  Administered 2017-02-16 – 2017-02-17 (×2): 0.25 mg via INTRAVENOUS
  Filled 2017-02-16 (×2): qty 1

## 2017-02-16 MED ORDER — SODIUM CHLORIDE 0.9% FLUSH
10.0000 mL | Freq: Two times a day (BID) | INTRAVENOUS | Status: DC
  Administered 2017-02-16 – 2017-03-03 (×23): 10 mL

## 2017-02-16 MED ORDER — SODIUM CHLORIDE 0.9% FLUSH: 10.0000 mL | INTRAVENOUS | Status: DC | PRN

## 2017-02-16 MED ORDER — DILTIAZEM HCL 100 MG IV SOLR: 20.0000 mg | Freq: Once | INTRAVENOUS | Status: DC

## 2017-02-16 MED ORDER — DILTIAZEM LOAD VIA INFUSION
20.0000 mg | Freq: Once | INTRAVENOUS | Status: AC
  Administered 2017-02-16: 20 mg via INTRAVENOUS
  Filled 2017-02-16: qty 20

## 2017-02-16 MED ORDER — ONDANSETRON HCL 4 MG/2ML IJ SOLN: 4.0000 mg | Freq: Four times a day (QID) | INTRAMUSCULAR | Status: DC | PRN

## 2017-02-16 MED ORDER — ONDANSETRON HCL 4 MG PO TABS: 4.0000 mg | ORAL_TABLET | Freq: Four times a day (QID) | ORAL | Status: DC | PRN

## 2017-02-16 MED ORDER — DILTIAZEM HCL-DEXTROSE 100-5 MG/100ML-% IV SOLN (PREMIX)
5.0000 mg/h | INTRAVENOUS | Status: DC
  Administered 2017-02-16 (×2): 15 mg/h via INTRAVENOUS
  Administered 2017-02-16: 5 mg/h via INTRAVENOUS
  Administered 2017-02-17: 15 mg/h via INTRAVENOUS
  Filled 2017-02-16 (×3): qty 100

## 2017-02-16 MED ORDER — PANTOPRAZOLE SODIUM 40 MG PO TBEC
40.0000 mg | DELAYED_RELEASE_TABLET | Freq: Every day | ORAL | Status: DC
Start: 2017-02-16 — End: 2017-02-26
  Administered 2017-02-16 – 2017-02-25 (×9): 40 mg via ORAL
  Filled 2017-02-16 (×9): qty 1

## 2017-02-16 MED ORDER — SODIUM CHLORIDE 0.9% FLUSH
3.0000 mL | Freq: Two times a day (BID) | INTRAVENOUS | Status: DC
  Administered 2017-02-16 – 2017-02-26 (×16): 3 mL via INTRAVENOUS

## 2017-02-16 MED ORDER — FUROSEMIDE 10 MG/ML IJ SOLN
40.0000 mg | Freq: Once | INTRAMUSCULAR | Status: AC
  Administered 2017-02-16: 40 mg via INTRAVENOUS
  Filled 2017-02-16: qty 4

## 2017-02-16 MED ORDER — MORPHINE SULFATE (PF) 4 MG/ML IV SOLN
2.0000 mg | INTRAVENOUS | Status: DC | PRN
  Administered 2017-02-16 – 2017-02-17 (×4): 2 mg via INTRAVENOUS
  Filled 2017-02-16 (×4): qty 1

## 2017-02-16 MED ORDER — FUROSEMIDE 10 MG/ML IJ SOLN: 40.0000 mg | Freq: Two times a day (BID) | INTRAMUSCULAR | Status: DC

## 2017-02-16 MED ORDER — LEVALBUTEROL HCL 0.63 MG/3ML IN NEBU
0.6300 mg | INHALATION_SOLUTION | Freq: Four times a day (QID) | RESPIRATORY_TRACT | Status: DC | PRN
  Administered 2017-02-17 – 2017-02-22 (×5): 0.63 mg via RESPIRATORY_TRACT
  Filled 2017-02-16 (×6): qty 3

## 2017-02-16 MED ORDER — FUROSEMIDE 10 MG/ML IJ SOLN
40.0000 mg | Freq: Two times a day (BID) | INTRAMUSCULAR | Status: DC
  Administered 2017-02-16 – 2017-02-17 (×2): 40 mg via INTRAVENOUS
  Filled 2017-02-16: qty 4

## 2017-02-16 MED ORDER — MORPHINE SULFATE (PF) 4 MG/ML IV SOLN
4.0000 mg | Freq: Once | INTRAVENOUS | Status: AC
  Administered 2017-02-16: 4 mg via INTRAVENOUS
  Filled 2017-02-16: qty 1

## 2017-02-16 MED ORDER — MONTELUKAST SODIUM 10 MG PO TABS
10.0000 mg | ORAL_TABLET | Freq: Every day | ORAL | Status: DC
  Administered 2017-02-16 – 2017-02-25 (×9): 10 mg via ORAL
  Filled 2017-02-16 (×9): qty 1

## 2017-02-16 MED ORDER — PROMETHAZINE HCL 25 MG PO TABS: 25.0000 mg | ORAL_TABLET | Freq: Four times a day (QID) | ORAL | Status: DC | PRN

## 2017-02-16 MED ORDER — OXYCODONE-ACETAMINOPHEN 7.5-325 MG PO TABS
1.0000 | ORAL_TABLET | Freq: Four times a day (QID) | ORAL | Status: DC | PRN
  Administered 2017-02-16 – 2017-02-26 (×21): 1 via ORAL
  Filled 2017-02-16 (×23): qty 1

## 2017-02-16 MED ORDER — CHLORHEXIDINE GLUCONATE CLOTH 2 % EX PADS
6.0000 | MEDICATED_PAD | Freq: Every day | CUTANEOUS | Status: DC
  Administered 2017-02-17 – 2017-03-03 (×15): 6 via TOPICAL

## 2017-02-16 MED ORDER — PRAVASTATIN SODIUM 20 MG PO TABS
20.0000 mg | ORAL_TABLET | Freq: Every day | ORAL | Status: DC
  Administered 2017-02-16 – 2017-02-25 (×10): 20 mg via ORAL
  Filled 2017-02-16 (×10): qty 1

## 2017-02-16 MED ORDER — DILTIAZEM LOAD VIA INFUSION
15.0000 mg | Freq: Once | INTRAVENOUS | Status: AC
  Administered 2017-02-16: 15 mg via INTRAVENOUS
  Filled 2017-02-16: qty 15

## 2017-02-16 MED ORDER — DIGOXIN 0.25 MG/ML IJ SOLN
0.5000 mg | Freq: Four times a day (QID) | INTRAMUSCULAR | Status: AC
  Administered 2017-02-16: 0.5 mg via INTRAVENOUS
  Filled 2017-02-16: qty 2

## 2017-02-16 NOTE — Progress Notes (Signed)
Spoke with IV team. May not be able to come insert PICC until late tonight or tomorrow. Phlebotomy to come and try to draw labs again around 1900. MD made aware troponin not able to be drawn.

## 2017-02-16 NOTE — Progress Notes (Signed)
MD made aware no labs have been drawn on patient. Order added for a PICC line. No further orders at this time.

## 2017-02-16 NOTE — ED Triage Notes (Signed)
Pt complaint of worsening productive cough with blood tinge for a month; pt verbalizes central chest sharpness with deep breath onset at midnight.

## 2017-02-16 NOTE — ED Provider Notes (Signed)
[facility]-EMERGENCY DEPT Provider Note   CSN: [id number] Arrival date & time: 01/26/2017  2426     History   Chief Complaint Chief Complaint  Patient presents with  . Cough  . Chest Pain    HPI [name] is a 66 y.o. female.  66 year old female presents with several day history of cough productive of bloody sputum with palpitations and irregular heartbeat with sharp chest pain worse with coughing. Has a history of CHF as well as proximal atrial fibrillation has been noncompliant with her medications. States that she is unsure of when the rate will heartbeat started and feels that it is been chronic for several days. Has also had lower extremity edema as well as orthopnea. Denies any anginal type chest pain. No syncope or near-syncope. Symptoms persistent and better with rest. No treatment use prior to arrival      Past Medical History:  Diagnosis Date  . Anemia   . Anxiety   . Arthritis    knees  . AVM (arteriovenous malformation) of small bowel, acquired (HCC)   . Borderline diabetes   . CHF (congestive heart failure) (HCC)   . Chronic respiratory failure (HCC)   . CKD (chronic kidney disease) stage 3, GFR 30-59 ml/min   . Depression   . Dysrhythmia    A-fib  . GERD (gastroesophageal reflux disease)   . Headache(784.0)   . Hyperlipidemia   . Hypertension   . Irregular heart beat   . Shortness of breath    on exertion    Patient Active Problem List   Diagnosis Date Noted  . Microcytic anemia 02/28/2016  . Chronic respiratory failure with hypoxia (HCC) 01/17/2016  . 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 PAF (paroxysmal atrial fibrillation) (HCC) 01/04/2016  . Osteoarthritis of both knees 01/04/2016  . Acute on chronic respiratory failure with hypoxia (HCC) 01/03/2016  . Acute on chronic respiratory failure (HCC) 01/03/2016  . Bronchiectasis without acute exacerbation (HCC) 12/16/2015  . 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 Dyspnea  12/13/2015  . Chest pain 10/24/2015  . Acute respiratory failure (HCC) 10/07/2015  . CKD (chronic kidney disease) stage 3, GFR 30-59 ml/min   . Acute on chronic diastolic CHF (congestive heart failure) (HCC) 05/24/2012  . Leukocytosis 05/24/2012  . Schizophrenia (HCC) 05/24/2012  . Atrial fibrillation (HCC) 03/02/2012  . Cigarette smoker 05/13/2007  . Essential hypertension 05/13/2007    Past Surgical History:  Procedure Laterality Date  . COLONOSCOPY N/A 02/25/2013   Procedure: COLONOSCOPY;  Surgeon: Theda Belfast, MD;  Location: [facility];  Service: Endoscopy;  Laterality: N/A;  . ESOPHAGOGASTRODUODENOSCOPY N/A 02/25/2013   Procedure: ESOPHAGOGASTRODUODENOSCOPY (EGD);  Surgeon: Theda Belfast, MD;  Location: [facility];  Service: Endoscopy;  Laterality: N/A;  . GIVENS CAPSULE STUDY N/A 02/26/2013   Procedure: GIVENS CAPSULE STUDY;  Surgeon: Theda Belfast, MD;  Location: [facility];  Service: Endoscopy;  Laterality: N/A;  . HEMORRHOID SURGERY    . TOE SURGERY  05/31/2015    OB History    No data available       Home Medications    Prior to Admission medications   Medication Sig Start Date End Date Taking? Authorizing Provider  amLODipine (NORVASC) 5 MG tablet Take 5 mg by mouth daily. 02/20/16   Historical Provider, MD  Calcium Carb-Cholecalciferol (CALCIUM 600 + D) 600-200 MG-UNIT TABS Take 1 tablet by mouth daily.    Historical Provider, MD  ferrous sulfate 325 (65 FE) MG tablet Take 1 tablet (325  mg total) by mouth 3 (three) times daily with meals. Patient taking differently: Take 325 mg by mouth daily with breakfast.  03/03/16   Nyoka Cowden, MD  furosemide (LASIX) 40 MG tablet Take 1 tablet (40 mg total) by mouth daily. Patient taking differently: Take 40 mg by mouth every morning.  01/06/16   Renae Fickle, MD  montelukast (SINGULAIR) 10 MG tablet Take 10 mg by mouth every evening. Reported on 02/28/2016    Historical Provider, MD  oxyCODONE-acetaminophen (PERCOCET)  7.5-325 MG tablet Take 1 tablet by mouth every 6 (six) hours.  12/19/15   Historical Provider, MD  OXYGEN Oxygen 2 lpm rest and 3lpm with exertion [facility]    Historical Provider, MD  pantoprazole (PROTONIX) 40 MG tablet Take 40 mg by mouth daily before breakfast.     Historical Provider, MD  potassium chloride SA (K-DUR,KLOR-CON) 20 MEQ tablet Take 1 tablet (20 mEq total) by mouth 2 (two) times daily. 05/26/12   Richarda Overlie, MD  pravastatin (PRAVACHOL) 20 MG tablet Take 20 mg by mouth daily.    Historical Provider, MD  promethazine (PHENERGAN) 25 MG tablet Take 1 tablet (25 mg total) by mouth every 6 (six) hours as needed for nausea. 06/04/16   Benjiman Core, MD    Family History Family History  Problem Relation Age of Onset  . Kidney disease Mother   . Heart attack Mother   . Hypertension Mother   . Kidney disease Father   . Breast cancer Daughter   . Heart disease Brother   . Diabetes Brother   . Lung cancer Brother   . Hypertension Sister   . Hypertension Daughter     Social History Social History  Substance Use Topics  . Smoking status: Current Every Day Smoker    Packs/day: 0.15    Years: 45.00    Types: Cigarettes  . Smokeless tobacco: Current User     Comment: 4 cigarettes daily trying to quit  . Alcohol use No     Allergies   Levaquin [levofloxacin] and Penicillins   Review of Systems Review of Systems  All other systems reviewed and are negative.    Physical Exam Updated Vital Signs BP (!) 154/103 (BP Location: Right Arm)   Pulse (!) 169   Resp (!) 22   Wt 91.6 kg   SpO2 90%   BMI 32.60 kg/m   Physical Exam  Constitutional: She is oriented to person, place, and time. She appears well-developed and well-nourished.  Non-toxic appearance. No distress.  HENT:  Head: Normocephalic and atraumatic.  Eyes: Conjunctivae, EOM and lids are normal. Pupils are equal, round, and reactive to light.  Neck: Normal range of motion. Neck supple. No tracheal  deviation present. No thyroid mass present.  Cardiovascular: Normal heart sounds.  An irregularly irregular rhythm present. Tachycardia present.  Exam reveals no gallop.   No murmur heard. Pulmonary/Chest: Effort normal. No stridor. No respiratory distress. She has decreased breath sounds in the right lower field and the left lower field. She has no wheezes. She has no rhonchi. She has no rales.  Abdominal: Soft. Normal appearance and bowel sounds are normal. She exhibits no distension. There is no tenderness. There is no rebound and no CVA tenderness.  Musculoskeletal: Normal range of motion. She exhibits no edema or tenderness.  Neurological: She is alert and oriented to person, place, and time. She has normal strength. No cranial nerve deficit or sensory deficit. GCS eye subscore is 4. GCS verbal subscore is 5.  GCS motor subscore is 6.  Skin: Skin is warm and dry. No abrasion and no rash noted.  Psychiatric: She has a normal mood and affect. Her speech is normal and behavior is normal.  Nursing note and vitals reviewed.    ED Treatments / Results  Labs (all labs ordered are listed, but only abnormal results are displayed) Labs Reviewed  BASIC METABOLIC PANEL  CBC  BRAIN NATRIURETIC PEPTIDE  I-STAT TROPOININ, ED    EKG  EKG Interpretation None       Radiology No results found.  Procedures Procedures (including critical care time)  Medications Ordered in ED Medications  diltiazem (CARDIZEM) 1 mg/mL load via infusion 15 mg (not administered)    And  diltiazem (CARDIZEM) 100 mg in dextrose 5% (1 mg/mL) infusion (not administered)     Initial Impression / Assessment and Plan / ED Course  I have reviewed the triage vital signs and the nursing notes.  Pertinent labs & imaging results that were available during my care of the patient were reviewed by me and considered in my medical decision making (see chart for details).   patient given Cardizem for her atrial  fibrillation with rapid ventricular rate response. She was be bolused and drip was adjusted due to nonresponsiveness to this medication. Her rate was in the 160s is now down to the 110s. She was treated with pain with morphine. Troponin elevation noted likely from demand ischemia. Hemoglobin is stable at 16. We'll admit to the medicine service   CRITICAL CARE Performed by: Toy Baker Total critical care time: 60 minutes Critical care time was exclusive of separately billable procedures and treating other patients. Critical care was necessary to treat or prevent imminent or life-threatening deterioration. Critical care was time spent personally by me on the following activities: development of treatment plan with patient and/or surrogate as well as nursing, discussions with consultants, evaluation of patient's response to treatment, examination of patient, obtaining history from patient or surrogate, ordering and performing treatments and interventions, ordering and review of laboratory studies, ordering and review of radiographic studies, pulse oximetry and re-evaluation of patient's condition.     Final Clinical Impressions(s) / ED Diagnoses   Final diagnoses:  Chest pain    New Prescriptions New Prescriptions   No medications on file     Lorre Nick, MD 01/23/2017 1159

## 2017-02-16 NOTE — Progress Notes (Signed)
Called by phlebotomy. Both phlebotomists unable to draw labs on patient despite attempts. Unable to draw timed lab for troponin level. MD paged. Will continue to follow up.

## 2017-02-16 NOTE — Progress Notes (Signed)
Peripherally Inserted Central Catheter/Midline Placement  The IV Nurse has discussed with the patient and/or persons authorized to consent for the patient, the purpose of this procedure and the potential benefits and risks involved with this procedure.  The benefits include less needle sticks, lab draws from the catheter, and the patient may be discharged home with the catheter. Risks include, but not limited to, infection, bleeding, blood clot (thrombus formation), and puncture of an artery; nerve damage and irregular heartbeat and possibility to perform a PICC exchange if needed/ordered by physician.  Alternatives to this procedure were also discussed.  Bard Power PICC patient education guide, fact sheet on infection prevention and patient information card has been provided to patient /or left at bedside.    PICC/Midline Placement Documentation  PICC Double Lumen 02/18/2017 PICC Right Brachial 41 cm 0 cm (Active)  Indication for Insertion or Continuance of Line Limited venous access - need for IV therapy >5 days (PICC only);Poor Vasculature-patient has had multiple peripheral attempts or PIVs lasting less than 24 hours 01/29/2017 10:03 PM  Exposed Catheter (cm) 0 cm 01/28/2017 10:03 PM  Site Assessment Clean;Dry;Intact 02/19/2017 10:03 PM  Lumen #1 Status Flushed;Saline locked;Blood return noted 02/21/2017 10:03 PM  Lumen #2 Status Flushed;Saline locked;Blood return noted 02/13/2017 10:03 PM  Dressing Type Transparent 02/08/2017 10:03 PM  Dressing Status Clean;Dry;Intact;Antimicrobial disc in place 02/02/2017 10:03 PM  Dressing Change Due 02/23/17 02/02/2017 10:03 PM       Ethelda Chick 02/01/2017, 10:05 PM

## 2017-02-16 NOTE — Progress Notes (Signed)
eLink Physician-Brief Progress Note Patient Name: [name] DOB: Jul 25, 1951 MRN: [id number]   Date of Service  02/03/2017  HPI/Events of Note  Dyspnea Pulmonary edema on CXR Diuresing some  eICU Interventions  ABG Add another dose of furosemide this evening at 2359 May need CCM consult, will monitor closely     Intervention Category Intermediate Interventions: Respiratory distress - evaluation and management  Max Fickle 01/29/2017, 9:36 PM

## 2017-02-16 NOTE — ED Notes (Addendum)
Allen aware of pt status and complaint and verbalizes will place additional orders as needed. Randi primary nurse made aware rectal temperature needs obtained per Freida Busman verbal order.

## 2017-02-16 NOTE — ED Notes (Signed)
Pt verbalizes wears 2 lpm [place] at home; 2 lpm [place] applied.

## 2017-02-16 NOTE — Progress Notes (Addendum)
Shift event: Paged by RNElnita Maxwell, about pt's breathing, HR, and desaturation on 5L per [place]. Cheryl bumped O2 up to hi flow at 12L/min. NP to bedside. S: pt feeling a little better after hi flow O2 started. Is on 2-4L O2 per [place] at home. Endorses pain under her Left breast which only occurs with breathing and moving around in bed. Denies any sternal pain.  O: Acutely ill appearing AAF in mild respiratory distress. A&O x3. BP 150s. HR Afib 110 to 130. RR 24 with mild increased WOB. O2 sat normal on Hi flow O2. She struggles mildly with speaking in full sentences but is non toxic appearing.  A/P: 1. Hypoxia secondary to pulmonary edema and hx of pulmonary fibrosis, on O2 at home. OK with increased O2 and will see if we can wean it later. Pt getting a PICC, so will check CXR with that. PCCM ordered extra dose of Lasix. Strict I&O. Is on Lasix, but could need further extra doses. Watch creatinine (has CKD III).  2. Afib with RVR-hx PAF, came in with RVR. On Cardizem at 15mg /hr now. Cardiology has seen pt and has ordered Digoxin. Next dose at 2300 hrs. HR likely up some now with WOB. Will continue to watch. Is not a candidate for Amiodarone due to chronic pulmonary issues.  3. Pain under left breast-since occurs with breathing is likely pleuritic, but will check 12 lead when PICC done. Troponins being cycled and first one was neg. Next one due soon.  EKG shows Afib with RVR, ventricular rate of 117.  Follow closely. PCCM also watching on camera. If deteriorates further, call for official consult.  KJKG, NP [facility] Update: Percocet not helping. MSO4 ordered. Awaiting next troponin. ABG looks OK. CXR shows evidence of pulmonary edema. Had extra dose of Lasix and is on BID as well. May need further dosing. Digoxin due at this time, will f/up HR. KJKG, NP [facility] Update: Called and spoke to [place], [place]. Pt is doing better. Pain med helped. O2 sats low 90s. 2nd troponin <.03. Has output of 675cc this shift. Refused  Foley.  KJKG, NP [facility]

## 2017-02-16 NOTE — H&P (Addendum)
History and Physical    [name] WNI:[id number] DOB: [birth year] DOA: 02/15/2017  I have briefly reviewed the patient's prior medical records in [facility]  PCP: [name], MD  Patient coming from: Home  Chief Complaint: Cough, shortness of breath and palpitations  HPI: [name] is a 66 y.o. female with medical history significant of paroxysmal A. 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  Over the last couple of days, she has been complaining of palpitations, irregular heartbeat as well as shortness of breath and a cough.  She has been having this cough for about couple of months, it was initially with white frothy sputum however the last day she has seen that the sputum is blood-tinged.  She is also been noticing that her lower extremities have started to swell a little bit, complains of unable to lay flat.  She does have any chest pain.  She denies abdominal pain, she has no nausea or vomiting.  She denies any lightheadedness or dizziness.  ED Course: In the emergency room, she is afebrile, on presentation she was found to be in A. fib with RVR.  She was on diltiazem drip.  Blood work shows creatinine of 1.4, her BNP was elevated at 500 and she has a slightly elevated troponin at 0.03.  Chest x-ray showed changes consistent with interstitial edema.  TRH is asked for admission for A. fib with RVR/acute on chronic diastolic CHF.  Review of Systems: As per HPI otherwise 10 point review of systems negative.   Past Medical History:  Diagnosis Date  . Anemia   . Anxiety   . Arthritis    knees  . AVM (arteriovenous malformation) of small bowel, acquired (HCC)   . Borderline diabetes   . CHF (congestive heart failure) (HCC)   .  Chronic respiratory failure (HCC)   . CKD (chronic kidney disease) stage 3, GFR 30-59 ml/min   . Depression   . Dysrhythmia    A-fib  . GERD (gastroesophageal reflux disease)   . Headache(784.0)   . Hyperlipidemia   . Hypertension   . Irregular heart beat   . Shortness of breath    on exertion    Past Surgical History:  Procedure Laterality Date  . COLONOSCOPY N/A 02/25/2013   Procedure: COLONOSCOPY;  Surgeon: Theda Belfast, MD;  Location: [facility];  Service: Endoscopy;  Laterality: N/A;  . ESOPHAGOGASTRODUODENOSCOPY N/A 02/25/2013   Procedure: ESOPHAGOGASTRODUODENOSCOPY (EGD);  Surgeon: Theda Belfast, MD;  Location: [facility];  Service: Endoscopy;  Laterality: N/A;  . GIVENS CAPSULE STUDY N/A 02/26/2013   Procedure: GIVENS CAPSULE STUDY;  Surgeon: Theda Belfast, MD;  Location: [facility];  Service: Endoscopy;  Laterality: N/A;  . HEMORRHOID SURGERY    . TOE SURGERY  05/31/2015     reports that she has been smoking Cigarettes.  She has a 6.75 pack-year smoking history. She uses smokeless tobacco. She reports that she uses drugs, including Marijuana. She reports that she does not drink alcohol.  Allergies  Allergen Reactions  . Levaquin [Levofloxacin] Hives  . Penicillins Swelling    Has patient had a PCN reaction causing immediate rash, facial/tongue/throat swelling, SOB or lightheadedness with hypotension yes Has patient had a PCN reaction causing severe rash involving mucus membranes or skin necrosis: unknown Has  patient had a PCN reaction that required hospitalization yes Has patient had a PCN reaction occurring within the last 10 years: no If all of the above answers are "NO", then may proceed with Cephalosporin use.     Family History  Problem Relation Age of Onset  . Kidney disease Mother   . Heart attack Mother   . Hypertension Mother   . Kidney disease Father   . Breast cancer Daughter   . Heart disease Brother   . Diabetes Brother   . Lung cancer Brother    . Hypertension Sister   . Hypertension Daughter     Prior to Admission medications   Medication Sig Start Date End Date Taking? Authorizing Provider  albuterol (PROVENTIL HFA;VENTOLIN HFA) 108 (90 Base) MCG/ACT inhaler Inhale 2 puffs into the lungs every 6 (six) hours as needed for wheezing or shortness of breath.   Yes Historical Provider, MD  albuterol (PROVENTIL) (2.5 MG/3ML) 0.083% nebulizer solution Take 2.5 mg by nebulization every 6 (six) hours as needed for wheezing or shortness of breath.   Yes Historical Provider, MD  amLODipine (NORVASC) 5 MG tablet Take 5 mg by mouth daily. 02/20/16  Yes Historical Provider, MD  Calcium Carb-Cholecalciferol (CALCIUM 600 + D) 600-200 MG-UNIT TABS Take 1 tablet by mouth daily.   Yes Historical Provider, MD  furosemide (LASIX) 40 MG tablet Take 1 tablet (40 mg total) by mouth daily. Patient taking differently: Take 80 mg by mouth daily.  01/06/16  Yes Renae Fickle, MD  montelukast (SINGULAIR) 10 MG tablet Take 10 mg by mouth every evening. Reported on 02/28/2016   Yes Historical Provider, MD  oxyCODONE-acetaminophen (PERCOCET) 7.5-325 MG tablet Take 1 tablet by mouth every 6 (six) hours.  12/19/15  Yes Historical Provider, MD  OXYGEN Oxygen 2 lpm rest and 3lpm with exertion [employer]   Yes Historical Provider, MD  pantoprazole (PROTONIX) 40 MG tablet Take 40 mg by mouth at bedtime.    Yes Historical Provider, MD  potassium chloride SA (K-DUR,KLOR-CON) 20 MEQ tablet Take 1 tablet (20 mEq total) by mouth 2 (two) times daily. 05/26/12  Yes Richarda Overlie, MD  pravastatin (PRAVACHOL) 20 MG tablet Take 20 mg by mouth daily.   Yes Historical Provider, MD  promethazine (PHENERGAN) 25 MG tablet Take 1 tablet (25 mg total) by mouth every 6 (six) hours as needed for nausea. 06/04/16  Yes Benjiman Core, MD  ferrous sulfate 325 (65 FE) MG tablet Take 1 tablet (325 mg total) by mouth 3 (three) times daily with meals. Patient not taking: Reported on 02/06/2017 03/03/16    Nyoka Cowden, MD    Physical Exam: Vitals:   02/18/2017 0855 01/31/2017 0857 02/15/2017 0930  BP: (!) 154/103  (!) 133/103  Pulse: (!) 169  62  Resp: (!) 22  (!) 26  Temp:   97.3 F (36.3 C)  TempSrc:   Oral  SpO2: 90%  95%  Weight:  91.6 kg (202 lb)       Constitutional: NAD, calm, comfortable Vitals:   01/22/2017 0855 02/07/2017 0857 02/20/2017 0930  BP: (!) 154/103  (!) 133/103  Pulse: (!) 169  62  Resp: (!) 22  (!) 26  Temp:   97.3 F (36.3 C)  TempSrc:   Oral  SpO2: 90%  95%  Weight:  91.6 kg (202 lb)    Eyes: PERRL, lids and conjunctivae normal ENMT: Mucous membranes are moist. Posterior pharynx clear of any exudate or lesions.  Neck: normal, supple, no masses, no  thyromegaly Respiratory: Bibasilar crackles, Velcro type sounds throughout bilateral lung fields.  Normal respiratory effort. No accessory muscle use.  Cardiovascular: Irregularly irregular, positive JVD, trace lower extremity edema.  Good peripheral pulses. Abdomen: no tenderness, no masses palpated. Bowel sounds positive.  Musculoskeletal: no clubbing / cyanosis. Normal muscle tone.  Skin: no rashes, lesions, ulcers. No induration Neurologic: CN 2-12 grossly intact. Strength 5/5 in all 4.  Psychiatric: Normal judgment and insight. Alert and oriented x 3. Normal mood.   Labs on Admission: I have personally reviewed following labs and imaging studies  CBC:  Recent Labs Lab 02/13/2017 0944  WBC 14.9*  HGB 16.2*  HCT 47.7*  MCV 81.7  PLT 270   Basic Metabolic Panel:  Recent Labs Lab 02/10/2017 0944  NA 134*  K 4.0  CL 105  CO2 16*  GLUCOSE 135*  BUN 24*  CREATININE 1.39*  CALCIUM 8.8*   GFR: Estimated Creatinine Clearance: 45.4 mL/min (A) (by C-G formula based on SCr of 1.39 mg/dL (H)). Liver Function Tests: No results for input(s): AST, ALT, ALKPHOS, BILITOT, PROT, ALBUMIN in the last 168 hours. No results for input(s): LIPASE, AMYLASE in the last 168 hours. No results for input(s): AMMONIA in  the last 168 hours. Coagulation Profile: No results for input(s): INR, PROTIME in the last 168 hours. Cardiac Enzymes:  Recent Labs Lab 01/24/2017 1018  TROPONINI 0.03*   BNP (last 3 results)  Recent Labs  02/28/16 1145  PROBNP 837.0*   HbA1C: No results for input(s): HGBA1C in the last 72 hours. CBG: No results for input(s): GLUCAP in the last 168 hours. Lipid Profile: No results for input(s): CHOL, HDL, LDLCALC, TRIG, CHOLHDL, LDLDIRECT in the last 72 hours. Thyroid Function Tests: No results for input(s): TSH, T4TOTAL, FREET4, T3FREE, THYROIDAB in the last 72 hours. Anemia Panel: No results for input(s): VITAMINB12, FOLATE, FERRITIN, TIBC, IRON, RETICCTPCT in the last 72 hours. Urine analysis:    Component Value Date/Time   COLORURINE AMBER (A) 06/07/2016 0645   APPEARANCEUR CLOUDY (A) 06/07/2016 0645   LABSPEC 1.025 06/07/2016 0645   PHURINE 5.5 06/07/2016 0645   GLUCOSEU NEGATIVE 06/07/2016 0645   HGBUR NEGATIVE 06/07/2016 0645   BILIRUBINUR SMALL (A) 06/07/2016 0645   KETONESUR NEGATIVE 06/07/2016 0645   PROTEINUR NEGATIVE 06/07/2016 0645   UROBILINOGEN 0.2 10/07/2015 1055   NITRITE NEGATIVE 06/07/2016 0645   LEUKOCYTESUR SMALL (A) 06/07/2016 0645     Radiological Exams on Admission: Dg Chest Port 1 View  Result Date: 02/19/2017 CLINICAL DATA:  Sudden onset left chest pain, initial encounter EXAM: PORTABLE CHEST 1 VIEW COMPARISON:  06/07/2016 FINDINGS: Cardiac shadow is enlarged but accentuated by the portable technique. Mild interstitial changes are noted bilaterally which may represent some interstitial edema. Chronic changes are seen in the bases bilaterally stable from the prior exam. No acute bony abnormality is noted. IMPRESSION: Chronic changes in the bases bilaterally. Changes of mild interstitial edema appear Electronically Signed   By: Alcide Clever M.D.   On: 02/15/2017 09:17    EKG: Independently reviewed.  A. fib with RVR  Assessment/Plan Active  Problems:   Essential hypertension   Acute on chronic diastolic CHF (congestive heart failure) (HCC)   Schizophrenia (HCC)   CKD (chronic kidney disease) stage 3, GFR 30-59 ml/min   Dyspnea   Bronchiectasis without acute exacerbation (HCC)   Acute on chronic respiratory failure with hypoxia (HCC)   PAF (paroxysmal atrial fibrillation) (HCC)   Amiodarone pulmonary toxicity   Atrial fibrillation with RVR (HCC)  A. fib with RVR -Patient started on Cardizem drip, will admit to stepdown.  Despite that rates are poorly controlled.  I have consulted cardiology, appreciate input. -She has a history of amiodarone induced lung toxicity, and is no longer on this medication. -Patient is not on any AV nodal joints at home as far as I can tell, she was bradycardic in cards clinic -CHADSVASC score 4 -she was on anticoagulation in the past, however she has a history of GI bleed and had anemia with blood thinners, and after discussing with her cardiologist it was decided for her not to be on any blood thinners currently.  Defer to cardiology for further discussions.  Acute on chronic diastolic CHF -We will update a 2D echo, will give patient IV Lasix as she has evidence of fluid overload on exam and chest x-ray  Chronic kidney disease stage III -Creatinine appears close to baseline  Amiodarone-induced pulmonary toxicity / pulmonary fibrosis / bronchiectasis inducing chronic respiratory failure with hypoxia -Patient on 4 L nasal cannula with activity and 2 L at rest at home -She had mild hemoptysis, which can appear in the setting of CHF exacerbation, hold Lovenox/heparin products  Hypertension -Resume home medications  Hyperlipidemia -Resume statin  DVT prophylaxis: SCDs Code Status: Full code Family Communication: No family at bedside Disposition Plan: Admit to stepdown Consults called: Cardiology    Admission status: Inpatient    At the time of admission, it appears that the  appropriate admission status for this patient is INPATIENT. This is judged to be reasonable and necessary in order to provide the required high service intensity to ensure the patient's safety given the presenting symptoms, physical exam findings, and initial radiographic and laboratory data in the context of their chronic comorbidities. Current circumstances are acute CHF, hemoptysis, Afib with RVR, and it is felt to place patient at high risk for further clinical deterioration threatening life, limb, or organ. Moreover, it is my clinical judgment that the patient will require inpatient hospital care spanning beyond 2 midnights from the point of admission and that early discharge would result in unnecessary risk of decompensation and readmission or threat to life, limb or bodily function.   Pamella Pert, MD [facility] Pager [phone]  If 7PM-7AM, please contact night-coverage [email or web address] Password [facility]  02/20/2017, 12:03 PM

## 2017-02-16 NOTE — Consult Note (Signed)
CARDIOLOGY CONSULT NOTE   Patient ID: [name] MRN: [id number] DOB/AGE: 1951/02/27 66 y.o.  Admit date: March 06, 2017  Primary Physician   Joy Plum, MD Primary Cardiologist   Dr [name], 08/27/2016 Reason for Consultation   Atrial fib, RVR and CHF Requesting MD: Dr [name]  [name] is a 66 y.o. year old female with a history of Atrial fib on amio, GIB on warfarin, pulm fibrosis ?2nd amio>>d/c'd, on home O2, CHADS2VASC=4 (age x 1, female x 1, CHF, HTN), borderline DM, D-CHF, AVM, OA, anemia, HTN, HLD.  Pt being admitted for CHF and rapid afib, cards asked to see.   [name] been noticing increasing dyspnea on exertion for a week or 2. She describes increasing dyspnea on exertion, orthopnea, and PND. She has had some mild lower extremity edema. She does not have scales and cannot track her weight at home. She feels her baseline weight at the doctor's office is 202 pounds. She thinks she has gained weight, but does not know how much. Her last doctor's visit where she had her heart rate and blood pressure checked in her weight, was in February at the pain clinic.   She is on chronic oxygen, but she got to the point that she never felt like she could really catch her breath. Going up any stairs which she has to do at times, was very difficult. In the last 48 hours, she has developed a productive cough with bloody sputum. She has also started having chest pain that is worse with deep inspiration and cough. The bloody sputum and chest pain are what prompted her to finally go to the emergency room.  She is rarely aware of her heart rate. She does not think she has been going in and out of atrial fibrillation but cannot be sure. She does not track her heart rate or blood pressure at home. On her home medications, she is not on any beta blockers or Cardizem/verapamil, possibly because of resting bradycardia. When she saw Dr. Rennis [name] in October 2017, her heart rate  was 52, sinus rhythm.  She still does not want to be on blood thinners and she still does not want to have any GI evaluation.   Past Medical History:  Diagnosis Date  . Anemia   . Anxiety   . Arthritis    knees  . AVM (arteriovenous malformation) of small bowel, acquired (HCC)   . Borderline diabetes   . Chronic diastolic CHF (congestive heart failure), NYHA class 2 (HCC)   . Chronic respiratory failure (HCC)   . CKD (chronic kidney disease) stage 3, GFR 30-59 ml/min   . Depression   . GERD (gastroesophageal reflux disease)   . Headache(784.0)   . Hyperlipidemia   . Hypertension   . PAF (paroxysmal atrial fibrillation) (HCC)   . Pulmonary fibrosis (HCC)    After being on amiodarone  . Shortness of breath    on exertion     Past Surgical History:  Procedure Laterality Date  . COLONOSCOPY N/A 02/25/2013   Procedure: COLONOSCOPY;  Surgeon: Joy Belfast, MD;  Location: [facility];  Service: Endoscopy;  Laterality: N/A;  . ESOPHAGOGASTRODUODENOSCOPY N/A 02/25/2013   Procedure: ESOPHAGOGASTRODUODENOSCOPY (EGD);  Surgeon: Joy Belfast, MD;  Location: [facility];  Service: Endoscopy;  Laterality: N/A;  . GIVENS CAPSULE STUDY N/A 02/26/2013   Procedure: GIVENS CAPSULE STUDY;  Surgeon: Joy Belfast, MD;  Location: [facility];  Service: Endoscopy;  Laterality: N/A;  . HEMORRHOID  SURGERY    . TOE SURGERY  05/31/2015    Allergies  Allergen Reactions  . Levaquin [Levofloxacin] Hives  . Penicillins Swelling    Has patient had a PCN reaction causing immediate rash, facial/tongue/throat swelling, SOB or lightheadedness with hypotension yes Has patient had a PCN reaction causing severe rash involving mucus membranes or skin necrosis: unknown Has patient had a PCN reaction that required hospitalization yes Has patient had a PCN reaction occurring within the last 10 years: no If all of the above answers are "NO", then may proceed with Cephalosporin use.     I have reviewed the  patient's current medications . furosemide  40 mg Intravenous Once   . diltiazem (CARDIZEM) infusion 5 mg/hr (02/19/2017 0949)   Medication Sig  albuterol (PROVENTIL HFA;VENTOLIN HFA) 108 (90 Base) MCG/ACT inhaler Inhale 2 puffs into the lungs every 6 (six) hours as needed for wheezing or shortness of breath.  albuterol (PROVENTIL) (2.5 MG/3ML) 0.083% nebulizer solution Take 2.5 mg by nebulization every 6 (six) hours as needed for wheezing or shortness of breath.  amLODipine (NORVASC) 5 MG tablet Take 5 mg by mouth daily.  Calcium Carb-Cholecalciferol (CALCIUM 600 + D) 600-200 MG-UNIT TABS Take 1 tablet by mouth daily.  furosemide (LASIX) 40 MG tablet Take 1 tablet (40 mg total) by mouth daily. Patient taking differently: Take 80 mg by mouth daily.   montelukast (SINGULAIR) 10 MG tablet Take 10 mg by mouth every evening. Reported on 02/28/2016  oxyCODONE-acetaminophen (PERCOCET) 7.5-325 MG tablet Take 1 tablet by mouth every 6 (six) hours.   OXYGEN Oxygen 2 lpm rest and 3lpm with exertion [employer]  pantoprazole (PROTONIX) 40 MG tablet Take 40 mg by mouth at bedtime.   potassium chloride SA (K-DUR,KLOR-CON) 20 MEQ tablet Take 1 tablet (20 mEq total) by mouth 2 (two) times daily.  pravastatin (PRAVACHOL) 20 MG tablet Take 20 mg by mouth daily.  promethazine (PHENERGAN) 25 MG tablet Take 1 tablet (25 mg total) by mouth every 6 (six) hours as needed for nausea.  ferrous sulfate 325 (65 FE) MG tablet Take 1 tablet (325 mg total) by mouth 3 (three) times daily with meals. Patient not taking: Reported on 02/13/2017     Social History   Social History  . Marital status: Single    Spouse name: N/A  . Number of children: N/A  . Years of education: N/A   Occupational History  . Disabled.  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 Smoking status: Current Every Day Smoker    Packs/day: 0.15    Years: 45.00    Types: Cigarettes  . Smokeless tobacco: Current User     Comment:  4 cigarettes daily trying to quit  . Alcohol use No  . Drug use: Yes    Types: Marijuana     Comment: previous marijuana, last 02/2013  . Sexual activity: Not Currently   Other Topics Concern  . Not on file   Social History Narrative   Divorced.  Lives alone.  Independent of ADLs, daughter helps with house management.    Family Status  Relation Status  . Mother Deceased at age 73   Heart attack, ESRD  . Father Deceased at age 42's   Kidney disease  . Brother Alive  . Sister Alive  . Maternal Grandmother Deceased  . Maternal Grandfather Deceased  . Paternal Grandmother Deceased  . Paternal Grandfather Deceased  . Daughter   . Brother   . Brother   .  Brother   . Sister   . Daughter    Family History  Problem Relation Age of Onset  . Kidney disease Mother   . Heart attack Mother   . Hypertension Mother   . Kidney disease Father   . Breast cancer Daughter   . Heart disease Brother   . Diabetes Brother   . Lung cancer Brother   . Hypertension Sister   . Hypertension Daughter      ROS:  Full 14 point review of systems complete and found to be negative unless listed above.  Physical Exam: Blood pressure (!) 133/103, pulse 62, temperature 97.3 F (36.3 C), temperature source Oral, resp. rate (!) 26, weight 202 lb (91.6 kg), SpO2 95 %.  General: Well developed, well nourished, female in no acute distress Head: Eyes PERRLA, No xanthomas.   Normocephalic and atraumatic, oropharynx without edema or exudate. Dentition: Fair  Lungs: Dense rales with some crackles Heart: Heart irregular rate and rhythm with S1, S2, no murmur. pulses are 2+ all 4 extrem.   Neck: No carotid bruits. No lymphadenopathy.  JVD elevated to jaw Abdomen: Bowel sounds present, abdomen soft and non-tender without masses or hernias noted. Msk:  No spine or cva tenderness. No weakness, no joint deformities or effusions. Extremities: No clubbing or cyanosis. No edema.  Neuro: Alert and oriented X 3. No  focal deficits noted. Psych:  Good affect, responds appropriately Skin: No rashes or lesions noted.  Labs:   Lab Results  Component Value Date   WBC 14.9 (H) 03-14-17   HGB 16.2 (H) 03-14-2017   HCT 47.7 (H) 2017-03-14   MCV 81.7 14-Mar-2017   PLT 270 14-Mar-2017     Recent Labs Lab 03/14/17 0944  NA 134*  K 4.0  CL 105  CO2 16*  BUN 24*  CREATININE 1.39*  CALCIUM 8.8*  GLUCOSE 135*    Recent Labs  March 14, 2017 1018  TROPONINI 0.03*   B Natriuretic Peptide  Date Value Ref Range Status  2017/03/14 501.5 (H) 0.0 - 100.0 pg/mL Final    Echo: 08/09/2015 - Left ventricle: The cavity size was normal. Systolic function was   normal. The estimated ejection fraction was in the range of 55%   to 60%. Wall motion was normal; there were no regional wall   motion abnormalities. Doppler parameters are consistent with   abnormal left ventricular relaxation (grade 1 diastolic dysfunction). - Left atrium: The atrium was mildly to moderately dilated. - Right ventricle: The cavity size was mildly dilated. Wall   thickness was normal. - Right atrium: The atrium was dilated. - Pulmonary arteries: Systolic pressure was mildly to moderately   increased. PA peak pressure: 47 mm Hg (S).  ECG:  03/26 Atrial fib, RVR, HR 156  Radiology:  Dg Chest Port 1 View Result Date: 03/14/17 CLINICAL DATA:  Sudden onset left chest pain, initial encounter EXAM: PORTABLE CHEST 1 VIEW COMPARISON:  06/07/2016 FINDINGS: Cardiac shadow is enlarged but accentuated by the portable technique. Mild interstitial changes are noted bilaterally which may represent some interstitial edema. Chronic changes are seen in the bases bilaterally stable from the prior exam. No acute bony abnormality is noted. IMPRESSION: Chronic changes in the bases bilaterally. Changes of mild interstitial edema appear Electronically Signed   By: Alcide Clever M.D.   On: 03/14/17 09:17    ASSESSMENT AND PLAN:   The patient was seen  today by Dr Herbie Baltimore, the patient evaluated and the data reviewed.   Principal Problem:  Acute on chronic respiratory failure with hypoxia (HCC) Active Problems:   Essential hypertension   Schizophrenia (HCC)   CKD (chronic kidney disease) stage 3, GFR 30-59 ml/min (BUN/Cr 05/2016 were 31/1.66)   Dyspnea   Bronchiectasis without acute exacerbation (HCC)   Amiodarone pulmonary toxicity   Atrial fibrillation with RVR (HCC)   Acute on chronic diastolic CHF (congestive heart failure) (HCC)  1.   Acute on chronic diastolic CHF (congestive heart failure) (HCC) - clearly volume overloaded by exam. - She reports her baseline weight is being 202 pounds. - current wgt ~210 lb (As she does not have scales she cannot track this at home. Discuss with staff if there is any way to get her a set of scales) - diurese with Lasix 40 mg IV BID and follow I/O, weights and BMET - repeat echo ordered, PAS was 47 in 2016, not sure if she could have pulm HTN from the fibrosis - will add afterload reduction - once we see that her blood pressure stabilized on current dose of diltiazem   2.   PAF (paroxysmal atrial fibrillation) (HCC) with RVR - unclear duration - pt not willing to do oral anticoagulation despite elevated CHADS2VASC=4 -- this would preclude consideration for doing cardioversion - This is related to hx GIB while on warfarin - not currently on anticoagulation secondary to bloody sputum. - When okay with IM, would do Lovenox while admitted.   Otherwise, per IM    Signed: Theodore Demark, PA-C 01/28/2017 1:17 PM Beeper [phone]  Co-Sign MD  I have seen, examined and evaluated the patient this PM along with Theodore Demark, PA-C.  After reviewing all the available data and chart, we discussed the patients laboratory, study & physical findings as well as symptoms in detail. I agree with her findings, examination as well as impression recommendations as per our discussion.    Recurrent A.  Fib RVR of uncertain duration. Unfortunately with her aversion to anticoagulation and active hemoptysis, I am leery of starting anticoagulation. This then makes cardioversion an unfavorable option. We don't will be using amiodarone given her history of toxicity and pulmonary fibrosis. For rate control I have given additional bolus of diltiazem and will give IV dig load (without intentions to continue digoxin) 500 g followed by 250 g 2.  I agree with diuresis is very difficult exam standpoint to tell if she has rales or rhonchi from her lung disease or from heart failure. For now would continue her diuresis. -- Has not had brisk output from the initial dose, would potentially increase Lasix dose, if she does not have a brisk response on the second dose.  An echocardiogram would not be helpful until her heart rate slowed. Considering heparin versus Lovenox while inpatient would be reasonable, but in the setting of active hemoptysis, I would not recommend at this time.   We will follow along.   Bryan Lemma, M.D., M.S. Interventional Cardiologist   Pager # [phone] Phone # [phone] [address] [place], [place] [address]

## 2017-02-17 ENCOUNTER — Inpatient Hospital Stay (HOSPITAL_COMMUNITY): Payer: Medicare Other

## 2017-02-17 DIAGNOSIS — N183 Chronic kidney disease, stage 3 (moderate): Secondary | ICD-10-CM

## 2017-02-17 DIAGNOSIS — I4891 Unspecified atrial fibrillation: Secondary | ICD-10-CM

## 2017-02-17 DIAGNOSIS — I509 Heart failure, unspecified: Secondary | ICD-10-CM

## 2017-02-17 DIAGNOSIS — J9601 Acute respiratory failure with hypoxia: Secondary | ICD-10-CM

## 2017-02-17 LAB — ECHOCARDIOGRAM COMPLETE
Height: 66 in
WEIGHTICAEL: 3375.68 [oz_av]

## 2017-02-17 LAB — CBC
HEMATOCRIT: 42.9 % (ref 36.0–46.0)
HEMOGLOBIN: 14.4 g/dL (ref 12.0–15.0)
MCH: 27 pg (ref 26.0–34.0)
MCHC: 33.6 g/dL (ref 30.0–36.0)
MCV: 80.5 fL (ref 78.0–100.0)
Platelets: 240 10*3/uL (ref 150–400)
RBC: 5.33 MIL/uL — ABNORMAL HIGH (ref 3.87–5.11)
RDW: 15.2 % (ref 11.5–15.5)
WBC: 19 10*3/uL — ABNORMAL HIGH (ref 4.0–10.5)

## 2017-02-17 LAB — COMPREHENSIVE METABOLIC PANEL
ALBUMIN: 2.8 g/dL — AB (ref 3.5–5.0)
ALT: 158 U/L — ABNORMAL HIGH (ref 14–54)
AST: 94 U/L — ABNORMAL HIGH (ref 15–41)
Alkaline Phosphatase: 167 U/L — ABNORMAL HIGH (ref 38–126)
Anion gap: 7 (ref 5–15)
BUN: 22 mg/dL — ABNORMAL HIGH (ref 6–20)
CHLORIDE: 102 mmol/L (ref 101–111)
CO2: 23 mmol/L (ref 22–32)
Calcium: 7.7 mg/dL — ABNORMAL LOW (ref 8.9–10.3)
Creatinine, Ser: 0.94 mg/dL (ref 0.44–1.00)
GFR calc non Af Amer: >60 mL/min (ref >60)
Glucose, Bld: 167 mg/dL — ABNORMAL HIGH (ref 65–99)
POTASSIUM: 3.2 mmol/L — AB (ref 3.5–5.1)
SODIUM: 132 mmol/L — AB (ref 135–145)
Total Bilirubin: 1.8 mg/dL — ABNORMAL HIGH (ref 0.3–1.2)
Total Protein: 6.6 g/dL (ref 6.5–8.1)

## 2017-02-17 LAB — TROPONIN I: Troponin I: 0.03 ng/mL (ref <0.03)

## 2017-02-17 LAB — DIFFERENTIAL
BASOS PCT: 0 %
Basophils Absolute: 0 10*3/uL (ref 0.0–0.1)
EOS ABS: 0.1 10*3/uL (ref 0.0–0.7)
EOS PCT: 0 %
LYMPHS ABS: 1.3 10*3/uL (ref 0.7–4.0)
Lymphocytes Relative: 7 %
MONO ABS: 1.8 10*3/uL — AB (ref 0.1–1.0)
MONOS PCT: 10 %
NEUTROS PCT: 83 %
Neutro Abs: 15.4 10*3/uL — ABNORMAL HIGH (ref 1.7–7.7)

## 2017-02-17 LAB — MAGNESIUM: Magnesium: 1.6 mg/dL — ABNORMAL LOW (ref 1.7–2.4)

## 2017-02-17 MED ORDER — LORAZEPAM 2 MG/ML IJ SOLN
0.5000 mg | INTRAMUSCULAR | Status: DC | PRN
  Administered 2017-02-17: 0.5 mg via INTRAVENOUS
  Filled 2017-02-17: qty 1

## 2017-02-17 MED ORDER — DILTIAZEM HCL 100 MG IV SOLR
5.0000 mg/h | INTRAVENOUS | Status: DC
  Administered 2017-02-17: 15 mg/h via INTRAVENOUS
  Administered 2017-02-17: 12.5 mg/h via INTRAVENOUS
  Administered 2017-02-17 – 2017-02-18 (×3): 15 mg/h via INTRAVENOUS
  Administered 2017-02-18: 10 mg/h via INTRAVENOUS
  Administered 2017-02-19: 15 mg/h via INTRAVENOUS
  Administered 2017-02-19 – 2017-02-20 (×3): 13 mg/h via INTRAVENOUS
  Filled 2017-02-17 (×10): qty 100

## 2017-02-17 MED ORDER — METOLAZONE 2.5 MG PO TABS
2.5000 mg | ORAL_TABLET | Freq: Every day | ORAL | Status: DC
  Administered 2017-02-18 – 2017-02-23 (×6): 2.5 mg via ORAL
  Filled 2017-02-17 (×7): qty 1

## 2017-02-17 MED ORDER — MORPHINE SULFATE (PF) 4 MG/ML IV SOLN
2.0000 mg | INTRAVENOUS | Status: DC | PRN
  Administered 2017-02-18 – 2017-02-19 (×4): 2 mg via INTRAVENOUS
  Filled 2017-02-17 (×4): qty 1

## 2017-02-17 MED ORDER — HYDRALAZINE HCL 20 MG/ML IJ SOLN
20.0000 mg | INTRAMUSCULAR | Status: AC | PRN
  Administered 2017-02-18: 20 mg via INTRAVENOUS
  Filled 2017-02-17: qty 1

## 2017-02-17 MED ORDER — METOLAZONE 2.5 MG PO TABS
2.5000 mg | ORAL_TABLET | Freq: Once | ORAL | Status: DC
  Filled 2017-02-17: qty 1

## 2017-02-17 MED ORDER — METOPROLOL TARTRATE 5 MG/5ML IV SOLN
5.0000 mg | Freq: Four times a day (QID) | INTRAVENOUS | Status: DC
  Administered 2017-02-17 – 2017-02-26 (×35): 5 mg via INTRAVENOUS
  Filled 2017-02-17 (×35): qty 5

## 2017-02-17 MED ORDER — POTASSIUM CHLORIDE CRYS ER 20 MEQ PO TBCR
40.0000 meq | EXTENDED_RELEASE_TABLET | ORAL | Status: AC
Start: 2017-02-17 — End: 2017-02-17
  Administered 2017-02-17 (×2): 40 meq via ORAL
  Filled 2017-02-17 (×2): qty 2

## 2017-02-17 MED ORDER — DIGOXIN 125 MCG PO TABS
0.1250 mg | ORAL_TABLET | Freq: Every day | ORAL | Status: DC
  Administered 2017-02-18 – 2017-02-25 (×8): 0.125 mg via ORAL
  Filled 2017-02-17 (×8): qty 1

## 2017-02-17 MED ORDER — METOLAZONE 2.5 MG PO TABS
2.5000 mg | ORAL_TABLET | ORAL | Status: AC
  Administered 2017-02-17: 2.5 mg via ORAL
  Filled 2017-02-17: qty 1

## 2017-02-17 MED ORDER — FUROSEMIDE 10 MG/ML IJ SOLN
40.0000 mg | Freq: Four times a day (QID) | INTRAMUSCULAR | Status: DC
  Administered 2017-02-17 – 2017-02-20 (×11): 40 mg via INTRAVENOUS
  Filled 2017-02-17 (×12): qty 4

## 2017-02-17 MED ORDER — MAGNESIUM SULFATE 4 GM/100ML IV SOLN
4.0000 g | Freq: Once | INTRAVENOUS | Status: AC
  Administered 2017-02-17: 4 g via INTRAVENOUS
  Filled 2017-02-17: qty 100

## 2017-02-17 MED ORDER — NICOTINE 7 MG/24HR TD PT24
7.0000 mg | MEDICATED_PATCH | Freq: Every day | TRANSDERMAL | Status: DC
  Administered 2017-02-17 – 2017-03-01 (×11): 7 mg via TRANSDERMAL
  Filled 2017-02-17 (×13): qty 1

## 2017-02-17 NOTE — Progress Notes (Signed)
  Echocardiogram 2D Echocardiogram has been performed.  [name] 02/17/2017, 2:47 PM

## 2017-02-17 NOTE — Progress Notes (Signed)
Progress Note  Patient Name: [name] Date of Encounter: 02/17/2017  Primary Cardiologist: [facility]  Patient Profile     66 y.o. female year old female with a history of Atrial fib on amio, GIB on warfarin, pulm fibrosis ?2nd amio>>d/c'd, on home O2, CHADS2VASC=4 (age x 1, female x 1, CHF, HTN), borderline DM, D-CHF, AVM, OA, anemia, HTN, HLD.  She was admitted for worsening exertional dyspnea over the last [redacted] weeks along with edema and now PND and orthopnea. Weight gain of roughly 10 pounds. Upon evaluation in the emergency room she was noted to be in A. fib RVR significant hypertension and frank hemoptysis.  Her exacerbations lately combination of recurrent A. fib RVR in setting of diastolic heart failure with existing pulmonary fibrosis.  Subjective   Yesterday she was started on diltiazem infusion after several boluses. She is also given IV digoxin. Unfortunately her rate still remained elevated. Scant urinary output with IV Lasix yesterday. Had better diuresis this morning after addition of metolazone.  Still subjectively short of breath and somewhat fidgety on oxygen high flow.  [facility] and now following.   Inpatient Medications    Scheduled Meds: . Chlorhexidine Gluconate Cloth  6 each Topical Daily  . [START ON 02/18/2017] digoxin  0.125 mg Oral Daily  . furosemide  40 mg Intravenous Q6H  . [START ON 02/18/2017] metolazone  2.5 mg Oral Daily  . metoprolol  5 mg Intravenous Q6H  . montelukast  10 mg Oral QHS  . pantoprazole  40 mg Oral QHS  . potassium chloride  40 mEq Oral Q4H  . pravastatin  20 mg Oral Daily  . sodium chloride flush  10-40 mL Intracatheter Q12H  . sodium chloride flush  3 mL Intravenous Q12H   Continuous Infusions: . diltiazem (CARDIZEM) infusion 12.5 mg/hr (02/17/17 1100)   PRN Meds: levalbuterol, morphine injection, ondansetron **OR** ondansetron (ZOFRAN) IV, oxyCODONE-acetaminophen, promethazine, sodium chloride flush   Vital Signs      Vitals:   02/17/17 1037 02/17/17 1051 02/17/17 1054 02/17/17 1100  BP:    (!) 170/103  Pulse:  (!) 120    Resp:  (!) 32 (!) 24 (!) 24  Temp:      TempSrc:      SpO2: 90% 97% 97% 96%  Weight:      Height:        Intake/Output Summary (Last 24 hours) at 02/17/17 1228 Last data filed at 02/17/17 1100  Gross per 24 hour  Intake           492.13 ml  Output             1075 ml  Net          -582.87 ml   Filed Weights   02/11/2017 0857 01/27/2017 1327  Weight: 91.6 kg (202 lb) 95.7 kg (210 lb 15.7 oz)    Telemetry    Afib 105-140 - Personally Reviewed  ECG    No new - Personally Reviewed  Physical Exam   BP (!) 170/103 Comment: MD made aware  Pulse (!) 120   Temp 99 F (37.2 C) (Oral)   Resp (!) 24   Ht 5\' 6"  (1.676 m)   Wt 95.7 kg (210 lb 15.7 oz)   SpO2 96%   BMI 34.05 kg/m  GEN: Mild - moderate distress.   Neck: ++ JVD roughly 14-16 cm water with cannon A waves Cardiac:  rapid irregularly irregular rate and rhythm. No obvious murmurs or gallops. Bilateral edema  at least 1-2+. SCDs in place Respiratory: Mildly increased effort. Coarse rhonchi likely basal rales GI: Soft, nontender, non-distended  MS:  102+ bilateral lower extremity edema; No deformity. Neuro:  Nonfocal  Psych: Blunted affect, easily excitable  Labs    Chemistry Recent Labs Lab 02/19/17 0944 02/17/17 0600  NA 134* 132*  K 4.0 3.2*  CL 105 102  CO2 16* 23  GLUCOSE 135* 167*  BUN 24* 22*  CREATININE 1.39* 0.94  CALCIUM 8.8* 7.7*  PROT  --  6.6  ALBUMIN  --  2.8*  AST  --  94*  ALT  --  158*  ALKPHOS  --  167*  BILITOT  --  1.8*  GFRNONAA 39* >60  GFRAA 45* >60  ANIONGAP 13 7     Hematology Recent Labs Lab 02/19/17 0944 02/17/17 0600  WBC 14.9* 19.0*  RBC 5.84* 5.33*  HGB 16.2* 14.4  HCT 47.7* 42.9  MCV 81.7 80.5  MCH 27.7 27.0  MCHC 34.0 33.6  RDW 15.6* 15.2  PLT 270 240    Cardiac Enzymes Recent Labs Lab 2017/02/19 1018 02/17/17 0057  TROPONINI 0.03* 0.03*   No  results for input(s): TROPIPOC in the last 168 hours.   BNP Recent Labs Lab 02-19-17 1108  BNP 501.5*     DDimer No results for input(s): DDIMER in the last 168 hours.   Radiology    Dg Chest Port 1 View  Result Date: 02/17/2017 CLINICAL DATA:  Shortness of Breath EXAM: PORTABLE CHEST 1 VIEW COMPARISON:  19-Feb-2017 FINDINGS: Cardiac shadow is enlarged. Vascular congestion and interstitial edema is noted similar to that seen on the prior exam. Right-sided PICC line is again identified. No bony abnormality is seen. IMPRESSION: Changes consistent with CHF Electronically Signed   By: Alcide Clever M.D.   On: 02/17/2017 10:44   Dg Chest Port 1 View  Result Date: 02/19/17 CLINICAL DATA:  Right PICC placement.  Initial encounter. EXAM: PORTABLE CHEST 1 VIEW COMPARISON:  Chest radiograph performed earlier today at 9:07 a.m. FINDINGS: A right PICC is noted ending about the cavoatrial junction. Bilateral airspace opacities raise concern for pulmonary edema. Underlying small bilateral pleural effusions are noted. No pneumothorax is seen. The cardiomediastinal silhouette is enlarged. No acute osseous abnormalities are identified. IMPRESSION: 1. Right PICC noted ending about the cavoatrial junction. 2. Bilateral airspace opacities raise concern for pulmonary edema. Small bilateral pleural effusions seen. Cardiomegaly. Electronically Signed   By: Roanna Raider M.D.   On: 2017-02-19 22:55   Dg Chest Port 1 View  Result Date: 02/19/17 CLINICAL DATA:  Sudden onset left chest pain, initial encounter EXAM: PORTABLE CHEST 1 VIEW COMPARISON:  06/07/2016 FINDINGS: Cardiac shadow is enlarged but accentuated by the portable technique. Mild interstitial changes are noted bilaterally which may represent some interstitial edema. Chronic changes are seen in the bases bilaterally stable from the prior exam. No acute bony abnormality is noted. IMPRESSION: Chronic changes in the bases bilaterally. Changes of mild  interstitial edema appear Electronically Signed   By: Alcide Clever M.D.   On: 02/19/17 09:17    Cardiac Studies   No new study   Assessment & Plan       Acute on chronic respiratory failure with hypoxia (HCC) 2/2: Chronic Pulmonary fibrosis,(Bronchiectasis without acute exacerbation (HCC) Amiodarone pulmonary toxicity)   Acute on chronic diastolic CHF (congestive heart failure) (HCC) exacerbated by PAF (paroxysmal atrial fibrillation) (HCC) -->    Atrial fibrillation with RVR (HCC)  Active Problems:   Essential  hypertension - poorly controlled    Schizophrenia (HCC)   CKD (chronic kidney disease) stage 3, GFR 30-59 ml/min        Acute on chronic diastolic heart failure with roughly 10 pound weight gain/exacerbated by A. fib RVR.  I suspect at least part of this exacerbation is related to A. fib RVR. Overly as her rate improves this will improve, but I suspect A. fib but by itself is not well tolerated.  Significantly elevated CVP partially from her pulmonary hypertension, but with rales, there is clearly likely elevated wedge pressure as well.  Would like to check 2-D echocardiogram, however with rapid A. fib, less effective. - Hopefully we can check once she is more rate controlled.  Agree with continuing IV Lasix. I have written to have standing oral metolazone for the first dose of Lasix in the morning.  After discussing with the CCM, we have started IV Lopressor for additional blood pressure and heart rate control.  She would benefit from afterload reduction -- since she is on diltiazem drip, would hold off on Norvasc; will start low-dose ARB   A. fib with RVR: Unfortunately, we are unable to consider cardioversion with hemoptysis making anticoagulation unfavorable. She is on high dose of diltiazem with inadequate rate control.  Added IV Lopressor standing for now.  Digoxin loaded yesterday IV, started by mouth digoxin today (will need to closely monitor her  electrolytes)  As an outpatient, she had sinus bradycardia, therefore we will need to likely back off on some of her AV nodal agents prior to discharge once she converts.     Defer management of hemoptysis to [facility]   Signed, Bryan Lemma, MD  02/17/2017, 12:28 PM

## 2017-02-17 NOTE — Progress Notes (Addendum)
PROGRESS NOTE    [name]   DGL:[id number]  DOB: 1951-01-28  DOA: 01-Mar-2017 PCP: [name], MD   Brief Narrative:  [name] is a 66 y.o. female with medical history significant of paroxysmal A. 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   2 wks of dry cough changing to hemoptysis 2-3 days ago (sputum not green or yellow- pure blood),.  Increasing shortness of breath. 2-3 days of ankle edema and orthopnea. States she has been taking daily Lasix 40 mg . Smoker - less than 1/2 ppd now.   Found to have A-fib with RVR and pulmonary edema. Cardizem infusion, IV Digoxin and IV lasix started.  Increasing O2 requirements overnight- now on 15 L with pulse ox in low 90s. RR in 30s.  Cough with pleuritic left sided chest pain.   Subjective: See above.  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   - ECHO 2016- EF normal with grade 1dCHF - increase Lasix to QID 40 mg IV, add Zaroxyln to see if this increased diuresis - obtain CVP via PICC  Active Problems:   Atrial fibrillation with RVR   - reestablished with Dr Rennis Golden on 10/17- saw [name] in 2015 - Cardizem infusion is at 15 mg/hr - loaded with IV Dig x 3 doses (0.5, ).25, 0.25) - last dose of 0.2mg   @ 5 AM  - HR in low 100s  - no anticoagulation currently - CHA2DS2-VASc Score 4- declines anticoagulation- as she no longer has microcytic anemia, would be reasonable to to discuss anticoagulation with her  h/o Microcytic anemia which has resolved - saw Dr Adela Lank in summer of 2017-- declined work up - tells me she stopped  Protonix about 4 mo ago and Iron prior to that - EGD and Colonoscopy in 2014 normal (Dr Elnoria Howard)  Pulmonary fibrisis, Smoker with obstructive lung disease - saw Dr Sherene Sires 6/17 - on Albuterol, Singulair and O2 as mentioned at home - currently no wheezing   Hypokalemia/ Hypomagnesemia - replace aggressively    CKD (chronic kidney disease) stage 3, GFR 30-59 ml/min - follow with diuresis  Essential HTN -- Hold Norvasc for now  HLD - Hold Pravastatin for now  Chronic pain - takes Oxycodone for this    DVT prophylaxis: SCDs Code Status: Full code Family Communication:  Disposition Plan: follow in sDU Consultants:   Cardiology Procedures:   PICC Antimicrobials:  Anti-infectives    None       Objective: Vitals:   02/17/17 0100 02/17/17 0200 02/17/17 0343 02/17/17 0701  BP: (!) 152/80 (!) 150/99    Pulse:      Resp: (!) 27 20    Temp:   98.3 F (36.8 C) 99 F (37.2 C)  TempSrc:   Axillary Oral  SpO2: 93% 91%    Weight:      Height:        Intake/Output Summary (Last 24 hours) at 02/17/17 0846 Last data filed at 02/17/17 0534  Gross per 24 hour  Intake            95.75 ml  Output  1075 ml  Net          -979.25 ml   Filed Weights   02/11/2017 0857 01/24/2017 1327  Weight: 91.6 kg (202 lb) 95.7 kg (210 lb 15.7 oz)    Examination: General exam: Appears comfortable  HEENT: PERRLA, oral mucosa moist, no sclera icterus or thrush Respiratory system: bilateral coarse basilar crackles and rhonchi, RR ion 30s, pulse ox 92% on 15 L Cardiovascular system: S1 & S2 heard, IIRR.  No murmurs HR in low 100s Gastrointestinal system: Abdomen soft, non-tender, nondistended. Normal bowel sound. No organomegaly Central nervous system: Alert and oriented. No focal neurological deficits. Extremities: No cyanosis, clubbing + LE edema Skin: No rashes or ulcers Psychiatry:  Mood & affect appropriate.     Data Reviewed: I have personally reviewed following labs and  imaging studies  CBC:  Recent Labs Lab 02/05/2017 0944 02/17/17 0600  WBC 14.9* 19.0*  HGB 16.2* 14.4  HCT 47.7* 42.9  MCV 81.7 80.5  PLT 270 240   Basic Metabolic Panel:  Recent Labs Lab 01/25/2017 0944 02/17/17 0500 02/17/17 0600  NA 134*  --  132*  K 4.0  --  3.2*  CL 105  --  102  CO2 16*  --  23  GLUCOSE 135*  --  167*  BUN 24*  --  22*  CREATININE 1.39*  --  0.94  CALCIUM 8.8*  --  7.7*  MG  --  1.6*  --    GFR: Estimated Creatinine Clearance: 68.7 mL/min (by C-G formula based on SCr of 0.94 mg/dL). Liver Function Tests:  Recent Labs Lab 02/17/17 0600  AST 94*  ALT 158*  ALKPHOS 167*  BILITOT 1.8*  PROT 6.6  ALBUMIN 2.8*   No results for input(s): LIPASE, AMYLASE in the last 168 hours. No results for input(s): AMMONIA in the last 168 hours. Coagulation Profile: No results for input(s): INR, PROTIME in the last 168 hours. Cardiac Enzymes:  Recent Labs Lab 02/05/2017 1018 02/17/17 0057  TROPONINI 0.03* 0.03*   BNP (last 3 results)  Recent Labs  02/28/16 1145  PROBNP 837.0*   HbA1C: No results for input(s): HGBA1C in the last 72 hours. CBG: No results for input(s): GLUCAP in the last 168 hours. Lipid Profile: No results for input(s): CHOL, HDL, LDLCALC, TRIG, CHOLHDL, LDLDIRECT in the last 72 hours. Thyroid Function Tests: No results for input(s): TSH, T4TOTAL, FREET4, T3FREE, THYROIDAB in the last 72 hours. Anemia Panel: No results for input(s): VITAMINB12, FOLATE, FERRITIN, TIBC, IRON, RETICCTPCT in the last 72 hours. Urine analysis:    Component Value Date/Time   COLORURINE AMBER (A) 06/07/2016 0645   APPEARANCEUR CLOUDY (A) 06/07/2016 0645   LABSPEC 1.025 06/07/2016 0645   PHURINE 5.5 06/07/2016 0645   GLUCOSEU NEGATIVE 06/07/2016 0645   HGBUR NEGATIVE 06/07/2016 0645   BILIRUBINUR SMALL (A) 06/07/2016 0645   KETONESUR NEGATIVE 06/07/2016 0645   PROTEINUR NEGATIVE 06/07/2016 0645   UROBILINOGEN 0.2 10/07/2015 1055   NITRITE  NEGATIVE 06/07/2016 0645   LEUKOCYTESUR SMALL (A) 06/07/2016 0645   Sepsis Labs: @LABRCNTIP (procalcitonin:4,lacticidven:4) ) Recent Results (from the past 240 hour(s))  MRSA PCR Screening     Status: None   Collection Time: 01/24/2017  1:00 PM  Result Value Ref Range Status   MRSA by PCR NEGATIVE NEGATIVE Final    Comment:        The GeneXpert MRSA Assay (FDA approved for NASAL specimens only), is one component of a comprehensive MRSA colonization surveillance program. It is not  intended to diagnose MRSA infection nor to guide or monitor treatment for MRSA infections.          Radiology Studies: Dg Chest Port 1 View  Result Date: 02/03/2017 CLINICAL DATA:  Right PICC placement.  Initial encounter. EXAM: PORTABLE CHEST 1 VIEW COMPARISON:  Chest radiograph performed earlier today at 9:07 a.m. FINDINGS: A right PICC is noted ending about the cavoatrial junction. Bilateral airspace opacities raise concern for pulmonary edema. Underlying small bilateral pleural effusions are noted. No pneumothorax is seen. The cardiomediastinal silhouette is enlarged. No acute osseous abnormalities are identified. IMPRESSION: 1. Right PICC noted ending about the cavoatrial junction. 2. Bilateral airspace opacities raise concern for pulmonary edema. Small bilateral pleural effusions seen. Cardiomegaly. Electronically Signed   By: Roanna Raider M.D.   On: 02/04/2017 22:55   Dg Chest Port 1 View  Result Date: 02/06/2017 CLINICAL DATA:  Sudden onset left chest pain, initial encounter EXAM: PORTABLE CHEST 1 VIEW COMPARISON:  06/07/2016 FINDINGS: Cardiac shadow is enlarged but accentuated by the portable technique. Mild interstitial changes are noted bilaterally which may represent some interstitial edema. Chronic changes are seen in the bases bilaterally stable from the prior exam. No acute bony abnormality is noted. IMPRESSION: Chronic changes in the bases bilaterally. Changes of mild interstitial edema  appear Electronically Signed   By: Alcide Clever M.D.   On: 02/09/2017 09:17      Scheduled Meds: . Chlorhexidine Gluconate Cloth  6 each Topical Daily  . furosemide  40 mg Intravenous Q6H  . magnesium sulfate 1 - 4 g bolus IVPB  4 g Intravenous Once  . metolazone  2.5 mg Oral Once  . montelukast  10 mg Oral QHS  . pantoprazole  40 mg Oral QHS  . potassium chloride  40 mEq Oral Q4H  . pravastatin  20 mg Oral Daily  . sodium chloride flush  10-40 mL Intracatheter Q12H  . sodium chloride flush  3 mL Intravenous Q12H   Continuous Infusions: . diltiazem (CARDIZEM) infusion 15 mg/hr (02/17/17 0554)     LOS: 1 day    Time spent in minutes: 45 min    Rome Schlauch, MD [facility] Pager: [email or web address] Password [facility] 02/17/2017, 8:46 AM

## 2017-02-17 NOTE — Consult Note (Signed)
PULMONARY / CRITICAL CARE MEDICINE   Name: [name] MRN: [id number] DOB: 03/04/1951    ADMISSION DATE:  02/08/2017 CONSULTATION DATE:  02/17/17  REFERRING MD:  Dr. Butler Denmark / [facility]   CHIEF COMPLAINT:  Shortness of Breath  HISTORY OF PRESENT ILLNESS:  66 y/o F, smoker (1/2 ppd x 52 years), former polysubstance abuse admitted on 3/26 with complaints of increased cough with blood tinged sputum, palpitations and central chest pain with deep breath.    She carries a medical history of schizophrenia, atrial fibrillation (took herself off anticoagulation / Xarelto, did not tolerate amiodarone due to lung toxicity), HTN, HLD, chronic diastolic CHF, CKD III, macrocytic anemia (pt declined work up, now resolved, EGD + Colonsocopy completed in 2014 per Dr. 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  Notes reflect "amiodarone was stopped and she improved with diuresis" (04/2016 office note from Dr. Sherene Sires).  At baseline, she wears 2L O2.  She lives with her brother.    The patient was admitted 3/26 with reports of increased cough with blood tinged sputum, palpitations and central chest pain with deep breath.  She reports she has been experiencing this cough for several months.  In addition, she has had increased swelling in her lower extremities, has to sleep on 2 pillows.  In the ER, she was found to be in AF with RVR and was started on a Cardizem gtt.  CXR was worrisome for interstitial edema.  Initial labs - Na 134, K 4, BUN 24 / Sr Cr 1.39, troponin 0.03, BNP 501, WBC 14.9, hgb 16.2, and platelets 240.  She was admitted by [facility] for [facility] evaluation.  The patient intermittently complained of shortness of breath and had increased O2 needs > up to 15L HFNC O2.  She was evaluated by Cardiology for rate control.  Unfortunately, she does not tolerate BiPAP due to claustrophobia.    PCCM consulted for evaluation of dyspnea.      PAST MEDICAL HISTORY :  She  has a past  medical history of Anemia; Anxiety; Arthritis; AVM (arteriovenous malformation) of small bowel, acquired (HCC); Borderline diabetes; Chronic diastolic CHF (congestive heart failure), NYHA class 2 (HCC); Chronic respiratory failure (HCC); CKD (chronic kidney disease) stage 3, GFR 30-59 ml/min; Depression; GERD (gastroesophageal reflux disease); Headache(784.0); Hyperlipidemia; Hypertension; PAF (paroxysmal atrial fibrillation) (HCC); Pulmonary fibrosis (HCC); and Shortness of breath.  PAST SURGICAL HISTORY: She  has a past surgical history that includes Hemorrhoid surgery; Esophagogastroduodenoscopy (N/A, 02/25/2013); Colonoscopy (N/A, 02/25/2013); Givens capsule study (N/A, 02/26/2013); and Toe Surgery (05/31/2015).  Allergies  Allergen Reactions  . Levaquin [Levofloxacin] Hives  . Penicillins Swelling    Has patient had a PCN reaction causing immediate rash, facial/tongue/throat swelling, SOB or lightheadedness with hypotension yes Has patient had a PCN reaction causing severe rash involving mucus membranes or skin necrosis: unknown Has patient had a PCN reaction that required hospitalization yes Has patient had a PCN reaction occurring within the last 10 years: no If all of the above answers are "NO", then may proceed with Cephalosporin use.     No current facility-administered medications on file prior to encounter.    Current Outpatient Prescriptions on File Prior to Encounter  Medication Sig  . amLODipine (NORVASC) 5 MG tablet Take 5 mg by mouth daily.  . Calcium Carb-Cholecalciferol (CALCIUM 600 + D) 600-200 MG-UNIT TABS Take 1 tablet by mouth daily.  . furosemide (LASIX) 40 MG tablet Take 1 tablet (40 mg total) by mouth daily. (  Patient taking differently: Take 80 mg by mouth daily. )  . montelukast (SINGULAIR) 10 MG tablet Take 10 mg by mouth every evening. Reported on 02/28/2016  . oxyCODONE-acetaminophen (PERCOCET) 7.5-325 MG tablet Take 1 tablet by mouth every 6 (six) hours.   . OXYGEN  Oxygen 2 lpm rest and 3lpm with exertion [employer]  . pantoprazole (PROTONIX) 40 MG tablet Take 40 mg by mouth at bedtime.   . potassium chloride SA (K-DUR,KLOR-CON) 20 MEQ tablet Take 1 tablet (20 mEq total) by mouth 2 (two) times daily.  . pravastatin (PRAVACHOL) 20 MG tablet Take 20 mg by mouth daily.  . promethazine (PHENERGAN) 25 MG tablet Take 1 tablet (25 mg total) by mouth every 6 (six) hours as needed for nausea.  . ferrous sulfate 325 (65 FE) MG tablet Take 1 tablet (325 mg total) by mouth 3 (three) times daily with meals. (Patient not taking: Reported on 10-Mar-2017)    FAMILY HISTORY:  Her indicated that her mother is deceased. She indicated that her father is deceased. She indicated that only one of her two sisters is alive. She indicated that only one of her four brothers is alive. She indicated that her maternal grandmother is deceased. She indicated that her maternal grandfather is deceased. She indicated that her paternal grandmother is deceased. She indicated that her paternal grandfather is deceased.    SOCIAL HISTORY: She  reports that she has been smoking Cigarettes.  She has a 6.75 pack-year smoking history. She uses smokeless tobacco. She reports that she uses drugs, including Marijuana. She reports that she does not drink alcohol.  REVIEW OF SYSTEMS:  POSITIVES IN BOLD Gen: Denies fever, chills, weight change, fatigue, night sweats HEENT: Denies blurred vision, double vision, hearing loss, tinnitus, sinus congestion, rhinorrhea, sore throat, neck stiffness, dysphagia PULM: Denies shortness of breath, cough, sputum production, hemoptysis, wheezing CV: Denies chest pain, edema, orthopnea, paroxysmal nocturnal dyspnea, palpitations GI: Denies abdominal pain, nausea, vomiting, diarrhea, hematochezia, melena, constipation, change in bowel habits GU: Denies dysuria, hematuria, polyuria, oliguria, urethral discharge Endocrine: Denies hot or cold intolerance, polyuria, polyphagia  or appetite change Derm: Denies rash, dry skin, scaling or peeling skin change Heme: Denies easy bruising, bleeding, bleeding gums Neuro: Denies headache, numbness, weakness, slurred speech, loss of memory or consciousness   SUBJECTIVE:  Pt denies complaints.  RN reports she is reluctant to discuss issues as she is afraid of certain medical therapies.    VITAL SIGNS: BP (!) 150/99 (BP Location: Left Arm)   Pulse (!) 120   Temp 99 F (37.2 C) (Oral)   Resp (!) 24   Ht 5\' 6"  (1.676 m)   Wt 210 lb 15.7 oz (95.7 kg)   SpO2 97%   BMI 34.05 kg/m   HEMODYNAMICS: CVP:  [26 mmHg] 26 mmHg  VENTILATOR SETTINGS:    INTAKE / OUTPUT: I/O last 3 completed shifts: In: 95.8 [I.V.:95.8] Out: 1075 [Urine:1075]  PHYSICAL EXAMINATION: General: well developed adult female in NAD, lying in bed Neuro:  AAOx4, speech clear, MAE HEENT:  MM pink/moist, good dentition  Cardiovascular:  s1s2 irr irr, 120-130's on monitor  Lungs:  Even/non-labored, lungs bilaterally with crackles  Abdomen:  Obese/soft, bsx4 active  Musculoskeletal:  No acute deformities  Skin:  Warm/dry, 1+ pitting BLE edema  LABS:  BMET  Recent Labs Lab 2017/03/10 0944 02/17/17 0600  NA 134* 132*  K 4.0 3.2*  CL 105 102  CO2 16* 23  BUN 24* 22*  CREATININE 1.39* 0.94  GLUCOSE 135*  167*    Electrolytes  Recent Labs Lab 01/29/2017 0944 02/17/17 0500 02/17/17 0600  CALCIUM 8.8*  --  7.7*  MG  --  1.6*  --     CBC  Recent Labs Lab 02/15/2017 0944 02/17/17 0600  WBC 14.9* 19.0*  HGB 16.2* 14.4  HCT 47.7* 42.9  PLT 270 240    Coag's No results for input(s): APTT, INR in the last 168 hours.  Sepsis Markers No results for input(s): LATICACIDVEN, PROCALCITON, O2SATVEN in the last 168 hours.  ABG  Recent Labs Lab 02/08/2017 2214  PHART 7.414  PCO2ART 35.9  PO2ART 69.8*    Liver Enzymes  Recent Labs Lab 02/17/17 0600  AST 94*  ALT 158*  ALKPHOS 167*  BILITOT 1.8*  ALBUMIN 2.8*    Cardiac  Enzymes  Recent Labs Lab 02/09/2017 1018 02/17/17 0057  TROPONINI 0.03* 0.03*    Glucose No results for input(s): GLUCAP in the last 168 hours.  Imaging Dg Chest Port 1 View  Result Date: 02/17/2017 CLINICAL DATA:  Shortness of Breath EXAM: PORTABLE CHEST 1 VIEW COMPARISON:  02/05/2017 FINDINGS: Cardiac shadow is enlarged. Vascular congestion and interstitial edema is noted similar to that seen on the prior exam. Right-sided PICC line is again identified. No bony abnormality is seen. IMPRESSION: Changes consistent with CHF Electronically Signed   By: Alcide Clever M.D.   On: 02/17/2017 10:44   Dg Chest Port 1 View  Result Date: 02/14/2017 CLINICAL DATA:  Right PICC placement.  Initial encounter. EXAM: PORTABLE CHEST 1 VIEW COMPARISON:  Chest radiograph performed earlier today at 9:07 a.m. FINDINGS: A right PICC is noted ending about the cavoatrial junction. Bilateral airspace opacities raise concern for pulmonary edema. Underlying small bilateral pleural effusions are noted. No pneumothorax is seen. The cardiomediastinal silhouette is enlarged. No acute osseous abnormalities are identified. IMPRESSION: 1. Right PICC noted ending about the cavoatrial junction. 2. Bilateral airspace opacities raise concern for pulmonary edema. Small bilateral pleural effusions seen. Cardiomegaly. Electronically Signed   By: Roanna Raider M.D.   On: 02/15/2017 22:55     STUDIES:  ECHO 3/27 >>   CULTURES:   ANTIBIOTICS:   SIGNIFICANT EVENTS: 3/26  Admit with cough, hemoptysis, pulmonary edema in setting of AF w RVR 3/27  Multiple bolus of cardizem, Cardiology following 3/28  PCCM consulted for increased O2 needs, SOB  LINES/TUBES: PICC 3/26 >>  DISCUSSION: 66 y/o F admitted on 3/26 with palpitations, cough with hemoptysis.  Found to have AFwRVR and pulmonary edema.    ASSESSMENT / PLAN:  PULMONARY A: Acute Hypoxic Respiratory Failure - suspect multifactorial in the setting of underlying  fibrosis, pulmonary edema in the setting of AFwRVR Hemoptysis - unclear etiology as she has been off anticoagulation, small volume, ? If related to cough Pulmonary Fibrosis - in setting of amiodarone Tobacco Abuse  Large RV on CXR - ? PH  P:   Continue O2 to support sats > 90% PRN BiPAP if patient will allow  Intermittent CXR Diuresis as renal function / BP permit  PRN xopenex Continue singulair Continue pravachol  CARDIOVASCULAR A:  Atrial Fibrillation with RVR Elevated Blood Pressure Chronic Diastolic CHF  HTN HLD P:  Cardiology following, appreciate input  Continue lanoxin Cardizem gtt  Diuresis per Cardiology  Add scheduled IV beta blocker for now to determine tolerance > 5mg  IV Q6 Continue pravachol   RENAL A:   CKD III Hypokalemia P:   Trend BMP / urinary output Replace electrolytes as indicated Avoid nephrotoxic  agents, ensure adequate renal perfusion  GASTROINTESTINAL A:   Hx AVM of Small Bowel P:   Heart healthy diet with 1200 ml fluid restriction  PPI   HEMATOLOGIC A:   Hx Macrocytic Anemia  P:  Trend CBC  SCD's for DVT prophylaxis   INFECTIOUS A:   Leukocytosis - no overt source infection  P:   Trend WBC / fever curve  Monitor off abx   ENDOCRINE A:   Borderline DM    P:   Monitor glucose on BMP   NEUROLOGIC A:   Schizophrenia  Anxiety / Depression  P:   RASS goal: n/a Control anxiety but avoid oversedation    FAMILY  - Updates: Patient updated on plan of care.   - Inter-disciplinary family meet or Palliative Care meeting due by:  4/3   NP CC Time: 30 minutes   Canary Brim, NP-C  Pulmonary & Critical Care Pgr: [phone] or if no answer [phone] 02/17/2017, 11:36 AM

## 2017-02-17 NOTE — Progress Notes (Signed)
Shift event: NP  Notified earlier by RN that pt was anxious on bipap and needed something to help. Ativan 0.5mg  ordered. About 30 mins later, pt became agitated, pulling at Bipap and causing desats into the mid 80s-88%. Tachypnea and tachycardia noted during event. NP came to bedside and ordered ABG.  However, pt continued agitation for 20 mins or so and would not allow staff to give meds, draw the ABG, or even touch her. On assessment, pt was confused thinking she was in the "basement" and was suspicious of staff harming her. However, during conversation, she talked about the staff that worked dayshift that she remembered.  At this point, pt is sitting on the edge of her bed with multiple staff in room. She was satting 88-92 on 12L high flow O2 per Ray at that time. RN managed to give some morphine, and pt rested after that and became calm and cooperative. For now, leave Bipap off. Given pt's chronic respiratory failure, will aim for O2 sat 88-90%.  KJKG, NP [facility]

## 2017-02-17 NOTE — Care Management Note (Signed)
Case Management Note  Patient Details  Name: [name] MRN: [id number] Date of Birth: [birth year]  Subjective/Objective:           A.fib with rvr and Iv Cardizem drip, iv lasix         Action/Plan:Date:  February 17, 2017 Chart reviewed for concurrent status and case management needs. Will continue to follow patient progress. Discharge Planning: following for needs Expected discharge date: [id number] Marcelle Smiling, BSN, [place], [place]   [phone]   Expected Discharge Date:   (unknown)               Expected Discharge Plan:  Home/Self Care  In-House Referral:     Discharge planning Services     Post Acute Care Choice:    Choice offered to:     DME Arranged:    DME Agency:     HH Arranged:    HH Agency:     Status of Service:  In process, will continue to follow  If discussed at Long Length of Stay Meetings, dates discussed:    Additional Comments:  [name], RN 02/17/2017, 11:33 AM

## 2017-02-18 ENCOUNTER — Inpatient Hospital Stay (HOSPITAL_COMMUNITY): Payer: Medicare Other

## 2017-02-18 DIAGNOSIS — K769 Liver disease, unspecified: Secondary | ICD-10-CM | POA: Diagnosis present

## 2017-02-18 DIAGNOSIS — J471 Bronchiectasis with (acute) exacerbation: Secondary | ICD-10-CM

## 2017-02-18 DIAGNOSIS — I272 Pulmonary hypertension, unspecified: Secondary | ICD-10-CM

## 2017-02-18 DIAGNOSIS — R042 Hemoptysis: Secondary | ICD-10-CM | POA: Diagnosis present

## 2017-02-18 DIAGNOSIS — F209 Schizophrenia, unspecified: Secondary | ICD-10-CM

## 2017-02-18 LAB — BASIC METABOLIC PANEL
Anion gap: 8 (ref 5–15)
BUN: 20 mg/dL (ref 6–20)
CO2: 29 mmol/L (ref 22–32)
CREATININE: 1.1 mg/dL — AB (ref 0.44–1.00)
Calcium: 8.5 mg/dL — ABNORMAL LOW (ref 8.9–10.3)
Chloride: 96 mmol/L — ABNORMAL LOW (ref 101–111)
GFR calc Af Amer: 59 mL/min — ABNORMAL LOW (ref >60)
GFR calc non Af Amer: 51 mL/min — ABNORMAL LOW (ref >60)
Glucose, Bld: 131 mg/dL — ABNORMAL HIGH (ref 65–99)
Potassium: 2.5 mmol/L — CL (ref 3.5–5.1)
SODIUM: 133 mmol/L — AB (ref 135–145)

## 2017-02-18 LAB — CBC
HCT: 44.7 % (ref 36.0–46.0)
Hemoglobin: 15.3 g/dL — ABNORMAL HIGH (ref 12.0–15.0)
MCH: 27.2 pg (ref 26.0–34.0)
MCHC: 34.2 g/dL (ref 30.0–36.0)
MCV: 79.5 fL (ref 78.0–100.0)
PLATELETS: 218 10*3/uL (ref 150–400)
RBC: 5.62 MIL/uL — ABNORMAL HIGH (ref 3.87–5.11)
RDW: 15.1 % (ref 11.5–15.5)
WBC: 22.2 10*3/uL — AB (ref 4.0–10.5)

## 2017-02-18 LAB — SEDIMENTATION RATE: Sed Rate: 9 mm/hr (ref 0–22)

## 2017-02-18 LAB — URINALYSIS, ROUTINE W REFLEX MICROSCOPIC
Bilirubin Urine: NEGATIVE
Glucose, UA: NEGATIVE mg/dL
Hgb urine dipstick: NEGATIVE
Ketones, ur: NEGATIVE mg/dL
LEUKOCYTES UA: NEGATIVE
Nitrite: NEGATIVE
PROTEIN: NEGATIVE mg/dL
Specific Gravity, Urine: 1.005 (ref 1.005–1.030)
pH: 5 (ref 5.0–8.0)

## 2017-02-18 LAB — MAGNESIUM: MAGNESIUM: 1.7 mg/dL (ref 1.7–2.4)

## 2017-02-18 LAB — COOXEMETRY PANEL
Carboxyhemoglobin: 1.7 % — ABNORMAL HIGH (ref 0.5–1.5)
Methemoglobin: 0.8 % (ref 0.0–1.5)
O2 Saturation: 65.4 %
TOTAL HEMOGLOBIN: 15.3 g/dL (ref 12.0–16.0)

## 2017-02-18 LAB — PROCALCITONIN: PROCALCITONIN: 1.05 ng/mL

## 2017-02-18 LAB — POTASSIUM: Potassium: 2.8 mmol/L — ABNORMAL LOW (ref 3.5–5.1)

## 2017-02-18 LAB — C-REACTIVE PROTEIN: CRP: 19.1 mg/dL — ABNORMAL HIGH (ref <1.0)

## 2017-02-18 MED ORDER — POTASSIUM CHLORIDE CRYS ER 20 MEQ PO TBCR
40.0000 meq | EXTENDED_RELEASE_TABLET | ORAL | Status: AC
  Administered 2017-02-18 (×2): 40 meq via ORAL
  Filled 2017-02-18 (×2): qty 2

## 2017-02-18 MED ORDER — MAGNESIUM SULFATE 4 GM/100ML IV SOLN
4.0000 g | Freq: Once | INTRAVENOUS | Status: AC
  Administered 2017-02-18: 4 g via INTRAVENOUS
  Filled 2017-02-18: qty 100

## 2017-02-18 MED ORDER — POTASSIUM CHLORIDE CRYS ER 20 MEQ PO TBCR
40.0000 meq | EXTENDED_RELEASE_TABLET | ORAL | Status: AC
  Administered 2017-02-18 – 2017-02-19 (×3): 40 meq via ORAL
  Filled 2017-02-18 (×3): qty 2

## 2017-02-18 NOTE — Progress Notes (Signed)
eLink Physician-Brief Progress Note Patient Name: [name] DOB: 1951-06-30 MRN: [id number]   Date of Service  02/18/2017  HPI/Events of Note  K+ = 2.5 and Creatinine = 1.10.  eICU Interventions  Will replace K+.     Intervention Category Intermediate Interventions: Electrolyte abnormality - evaluation and management  Agnes Probert Eugene 02/18/2017, 6:09 AM

## 2017-02-18 NOTE — Progress Notes (Signed)
Patient spit up 3 medium size blood clots within one hour.  Provider notified. Will continue to monitor.

## 2017-02-18 NOTE — Progress Notes (Signed)
PULMONARY / CRITICAL CARE MEDICINE   Name: [name] MRN: [id number] DOB: 1951-06-02    ADMISSION DATE:  01/27/2017 CONSULTATION DATE:  02/17/17  REFERRING MD:  Dr. Wynelle Cleveland / [facility]   CHIEF COMPLAINT:  Shortness of Breath  SUMMARY:  66 y/o F, smoker (1/2 ppd x 52 years), former polysubstance abuse admitted on 3/26 with complaints of increased cough with blood tinged sputum, palpitations and central chest pain with deep breath.    She carries a medical history of schizophrenia, atrial fibrillation (took herself off anticoagulation / Xarelto, did not tolerate amiodarone due to lung toxicity), HTN, HLD, chronic diastolic CHF, CKD III, macrocytic anemia (pt declined work up, now resolved, EGD + Colonsocopy completed in 2014 per Dr. 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  Notes reflect "amiodarone was stopped and she improved with diuresis" (04/2016 office note from Dr. Melvyn Novas).  At baseline, she wears 2L O2.  She lives with her brother.    The patient was admitted 3/26 with reports of increased cough with blood tinged sputum, palpitations and central chest pain with deep breath.  She reports she has been experiencing this cough for several months.  In addition, she has had increased swelling in her lower extremities, has to sleep on 2 pillows.  In the ER, she was found to be in AF with RVR and was started on a Cardizem gtt.  CXR was worrisome for interstitial edema.  Initial labs - Na 134, K 4, BUN 24 / Sr Cr 1.39, troponin 0.03, BNP 501, WBC 14.9, hgb 16.2, and platelets 240.  She was admitted by [facility] for further evaluation.  The patient intermittently complained of shortness of breath and had increased O2 needs > up to 15L HFNC O2.  She was evaluated by Cardiology for rate control.  Unfortunately, she does not tolerate BiPAP due to claustrophobia.    PCCM consulted for evaluation of dyspnea.        SUBJECTIVE:  RN reports pt had episode of significant  anxiety overnight while wearing bipap > she is known to be claustrophobic and doesn't tolerate bipap.  Required ativan / morphine for anxiety.  Sleepy but improved this am.  Chest continues to feel tight, same quantity of hemoptysis / no increase.  Net neg 2.2 L since admit / 965 in last 24 hours.   VITAL SIGNS: BP (!) 156/78   Pulse (!) 107   Temp 98.6 F (37 C) (Oral)   Resp (!) 24   Ht '5\' 6"'$  (1.676 m)   Wt 210 lb 15.7 oz (95.7 kg)   SpO2 92%   BMI 34.05 kg/m   HEMODYNAMICS: CVP:  [16 mmHg-26 mmHg] 17 mmHg  VENTILATOR SETTINGS:    INTAKE / OUTPUT: I/O last 3 completed shifts: In: 739.5 [I.V.:639.5; IV Piggyback:100] Out: 2600 [Urine:2600]  PHYSICAL EXAMINATION: General:  Well developed adult female in NAD HEENT: MM pink/moist, JVD+ PSY: calm Neuro: AAOx4, speech clear, MAE CV: s1s2 rrr, no m/r/g PULM: even/non-labored, lungs bilaterally clear anterior, few crackles lower ZD:GLOV, non-tender, bsx4 active  Extremities: warm/dry, 1+ BLE edema  Skin: no rashes or lesions   LABS:  BMET  Recent Labs Lab 02/01/2017 0944 02/17/17 0600 02/18/17 0448  NA 134* 132* 133*  K 4.0 3.2* 2.5*  CL 105 102 96*  CO2 16* 23 29  BUN 24* 22* 20  CREATININE 1.39* 0.94 1.10*  GLUCOSE 135* 167* 131*    Electrolytes  Recent Labs Lab 02/11/2017 0944 02/17/17 0500  02/17/17 0600 02/18/17 0448  CALCIUM 8.8*  --  7.7* 8.5*  MG  --  1.6*  --  1.7    CBC  Recent Labs Lab 02/17/2017 0944 02/17/17 0600 02/18/17 0448  WBC 14.9* 19.0* 22.2*  HGB 16.2* 14.4 15.3*  HCT 47.7* 42.9 44.7  PLT 270 240 218    Coag's No results for input(s): APTT, INR in the last 168 hours.  Sepsis Markers No results for input(s): LATICACIDVEN, PROCALCITON, O2SATVEN in the last 168 hours.  ABG  Recent Labs Lab 02/10/2017 2214  PHART 7.414  PCO2ART 35.9  PO2ART 69.8*    Liver Enzymes  Recent Labs Lab 02/17/17 0600  AST 94*  ALT 158*  ALKPHOS 167*  BILITOT 1.8*  ALBUMIN 2.8*     Cardiac Enzymes  Recent Labs Lab 01/28/2017 1018 02/17/17 0057  TROPONINI 0.03* 0.03*    Glucose No results for input(s): GLUCAP in the last 168 hours.  Imaging Dg Chest Port 1 View  Result Date: 02/18/2017 CLINICAL DATA:  Hemoptysis.  CHF. EXAM: PORTABLE CHEST 1 VIEW COMPARISON:  Chest radiograph yesterday, additional priors FINDINGS: Tip of the right upper extremity PICC in the SVC. Low lung volumes. Stable cardiomegaly and mediastinal contours. Interstitial edema is unchanged from prior exam. Hazy lung base opacities consistent with pleural fluid, stable. No significant interval change. IMPRESSION: No significant interval change in pulmonary edema, cardiomegaly, probable bilateral pleural effusions. Radiographic findings suggest CHF. Electronically Signed   By: Jeb Levering M.D.   On: 02/18/2017 04:51   Dg Chest Port 1 View  Result Date: 02/17/2017 CLINICAL DATA:  Shortness of Breath EXAM: PORTABLE CHEST 1 VIEW COMPARISON:  02/08/2017 FINDINGS: Cardiac shadow is enlarged. Vascular congestion and interstitial edema is noted similar to that seen on the prior exam. Right-sided PICC line is again identified. No bony abnormality is seen. IMPRESSION: Changes consistent with CHF Electronically Signed   By: Inez Catalina M.D.   On: 02/17/2017 10:44     STUDIES:  ECHO 3/27 >> LVEF 50-55%, normal wall motion, LA moderately dilated, RA/RV moderately dilated, ventriclar septum with diastolic / systolic flattening, moderate TR, PA peak 70, small pericardial effusion  CULTURES:   ANTIBIOTICS:   SIGNIFICANT EVENTS: 3/26  Admit with cough, hemoptysis, pulmonary edema in setting of AF w RVR 3/27  Multiple bolus of cardizem, Cardiology following 3/28  PCCM consulted for increased O2 needs, SOB 3/29  Improved   LINES/TUBES: PICC 3/26 >>  DISCUSSION: 66 y/o F admitted on 3/26 with palpitations, cough with hemoptysis.  Found to have AFwRVR and pulmonary edema.    ASSESSMENT /  PLAN:  PULMONARY A: Acute Hypoxic Respiratory Failure - suspect multifactorial in the setting of underlying fibrosis, pulmonary edema in the setting of AFwRVR Hemoptysis - unclear etiology as she has been off anticoagulation, small volume, ? If related to cough / airway irritation Pulmonary Fibrosis - in setting of amiodarone Tobacco Abuse  Pulmonary HTN - PA peak 70 on ECHO  P:   Continue O2 to support sats >90% Pt does not tolerate bipap > claustrophobia  Diuresis as renal function / BP permit  PRN xopenex Continue singulair  Intermittent CXR Likely needs R/L HC to better define pulmonary hypertension  Assess centromere antibody screen, ANA, ESR, SCL-70, CRP, Smith, DS DNA, RF, anti-ccp  CARDIOVASCULAR A:  Atrial Fibrillation with RVR Elevated Blood Pressure Chronic Diastolic CHF  HTN HLD P:  Cardiology following, appreciate input  Lanoxin, cardizem, beta blocker, pravachol, diuresis per Cards  RENAL A:  CKD III Hypokalemia P:   Trend BMP / urinary output Replace electrolytes as indicated Avoid nephrotoxic agents, ensure adequate renal perfusion  GASTROINTESTINAL A:   Hx AVM of Small Bowel P:   PPI  Heart healthy diet with 1200 ml fluid restriction   HEMATOLOGIC A:   Hx Macrocytic Anemia  P:  Trend CBC  SCD's for DVT prophylaxis with hemoptysis   INFECTIOUS A:   Leukocytosis - no overt source infection  P:   Trend WBC / fever curve  ENDOCRINE A:   Borderline DM    P:   Monitor glucose on BMP  NEUROLOGIC A:   Schizophrenia  Anxiety / Depression  P:   RASS goal: n/a Control anxiety but avoid oversedation    FAMILY  - Updates: Patient updated on plan of care 3/28  - Inter-disciplinary family meet or Palliative Care meeting due by:  4/3   PCCM will be available PRN.  Please call back if new needs arise.    Noe Gens, NP-C [facility] Pgr: [phone] or if no answer [phone] 02/18/2017, 9:00 AM

## 2017-02-18 NOTE — Progress Notes (Signed)
`  Progress Note  Patient Name: [name] Date of Encounter: 02/18/2017  Primary Cardiologist: Hilty  Subjective   Feels her breathing is slightly better. Currently getting washed up and slightly dyspneic with activity.   Inpatient Medications    Scheduled Meds: . Chlorhexidine Gluconate Cloth  6 each Topical Daily  . digoxin  0.125 mg Oral Daily  . furosemide  40 mg Intravenous Q6H  . magnesium sulfate 1 - 4 g bolus IVPB  4 g Intravenous Once  . metolazone  2.5 mg Oral Daily  . metoprolol  5 mg Intravenous Q6H  . montelukast  10 mg Oral QHS  . nicotine  7 mg Transdermal QHS  . pantoprazole  40 mg Oral QHS  . potassium chloride  40 mEq Oral Q4H  . pravastatin  20 mg Oral Daily  . sodium chloride flush  10-40 mL Intracatheter Q12H  . sodium chloride flush  3 mL Intravenous Q12H   Continuous Infusions: . diltiazem (CARDIZEM) infusion 10 mg/hr (02/18/17 0444)   PRN Meds: hydrALAZINE, levalbuterol, morphine injection, ondansetron **OR** ondansetron (ZOFRAN) IV, oxyCODONE-acetaminophen, promethazine, sodium chloride flush   Vital Signs    Vitals:   02/18/17 0600 02/18/17 0700 02/18/17 0800 02/18/17 0900  BP: (!) 156/78 (!) 152/54 (!) 147/76 (!) 135/91  Pulse:      Resp: (!) 24 16 20  (!) 34  Temp:   98.6 F (37 C)   TempSrc:   Oral   SpO2: 92% 96% 96% 92%  Weight:      Height:        Intake/Output Summary (Last 24 hours) at 02/18/17 0953 Last data filed at 02/18/17 02/20/17  Gross per 24 hour  Intake           622.97 ml  Output             2225 ml  Net         -1602.03 ml   Filed Weights   February 20, 2017 0857 20-Feb-2017 1327  Weight: 202 lb (91.6 kg) 210 lb 15.7 oz (95.7 kg)    Telemetry    Afib (90-110s) - Personally Reviewed  ECG    N/A - Personally Reviewed  Physical Exam   General: Well developed, well nourished, female appearing in no acute distress. On HFNC Head: Normocephalic, atraumatic.  Neck: Supple without bruits, ++ JVD. Lungs:  Resp  regular and unlabored, faint crackles. Heart: Irreg Irreg, S1, S2, no S3, S4, or murmur; no rub. Abdomen: Soft, non-tender, non-distended with normoactive bowel sounds. No hepatomegaly. No rebound/guarding. No obvious abdominal masses. Extremities: ++ clubbing, no cyanosis, trace LE L>R edema. Distal pedal pulses are 2+ bilaterally. Neuro: Alert and oriented X 3. Moves all extremities spontaneously. Psych: Normal affect.  Labs    Chemistry Recent Labs Lab 2017-02-20 0944 02/17/17 0600 02/18/17 0448  NA 134* 132* 133*  K 4.0 3.2* 2.5*  CL 105 102 96*  CO2 16* 23 29  GLUCOSE 135* 167* 131*  BUN 24* 22* 20  CREATININE 1.39* 0.94 1.10*  CALCIUM 8.8* 7.7* 8.5*  PROT  --  6.6  --   ALBUMIN  --  2.8*  --   AST  --  94*  --   ALT  --  158*  --   ALKPHOS  --  167*  --   BILITOT  --  1.8*  --   GFRNONAA 39* >60 51*  GFRAA 45* >60 59*  ANIONGAP 13 7 8      Hematology Recent Labs Lab  02/08/2017 0944 02/17/17 0600 02/18/17 0448  WBC 14.9* 19.0* 22.2*  RBC 5.84* 5.33* 5.62*  HGB 16.2* 14.4 15.3*  HCT 47.7* 42.9 44.7  MCV 81.7 80.5 79.5  MCH 27.7 27.0 27.2  MCHC 34.0 33.6 34.2  RDW 15.6* 15.2 15.1  PLT 270 240 218    Cardiac Enzymes Recent Labs Lab 02/21/2017 1018 02/17/17 0057  TROPONINI 0.03* 0.03*   No results for input(s): TROPIPOC in the last 168 hours.   BNP Recent Labs Lab 02/21/2017 1108  BNP 501.5*     DDimer No results for input(s): DDIMER in the last 168 hours.    Radiology    Dg Chest Port 1 View  Result Date: 02/18/2017 CLINICAL DATA:  Hemoptysis.  CHF. EXAM: PORTABLE CHEST 1 VIEW COMPARISON:  Chest radiograph yesterday, additional priors FINDINGS: Tip of the right upper extremity PICC in the SVC. Low lung volumes. Stable cardiomegaly and mediastinal contours. Interstitial edema is unchanged from prior exam. Hazy lung base opacities consistent with pleural fluid, stable. No significant interval change. IMPRESSION: No significant interval change in  pulmonary edema, cardiomegaly, probable bilateral pleural effusions. Radiographic findings suggest CHF. Electronically Signed   By: Rubye Oaks M.D.   On: 02/18/2017 04:51   Dg Chest Port 1 View  Result Date: 02/17/2017 CLINICAL DATA:  Shortness of Breath EXAM: PORTABLE CHEST 1 VIEW COMPARISON:  02/14/2017 FINDINGS: Cardiac shadow is enlarged. Vascular congestion and interstitial edema is noted similar to that seen on the prior exam. Right-sided PICC line is again identified. No bony abnormality is seen. IMPRESSION: Changes consistent with CHF Electronically Signed   By: Alcide Clever M.D.   On: 02/17/2017 10:44   Dg Chest Port 1 View  Result Date: 02/04/2017 CLINICAL DATA:  Right PICC placement.  Initial encounter. EXAM: PORTABLE CHEST 1 VIEW COMPARISON:  Chest radiograph performed earlier today at 9:07 a.m. FINDINGS: A right PICC is noted ending about the cavoatrial junction. Bilateral airspace opacities raise concern for pulmonary edema. Underlying small bilateral pleural effusions are noted. No pneumothorax is seen. The cardiomediastinal silhouette is enlarged. No acute osseous abnormalities are identified. IMPRESSION: 1. Right PICC noted ending about the cavoatrial junction. 2. Bilateral airspace opacities raise concern for pulmonary edema. Small bilateral pleural effusions seen. Cardiomegaly. Electronically Signed   By: Roanna Raider M.D.   On: 02/12/2017 22:55    Cardiac Studies   TTE: 02/17/17   Study Conclusions  - Left ventricle: The cavity size was normal. Wall thickness was   normal. Systolic function was normal. The estimated ejection   fraction was in the range of 50% to 55%. Wall motion was normal;   there were no regional wall motion abnormalities. - Ventricular septum: The contour showed diastolic flattening and   systolic flattening. - Left atrium: The atrium was moderately dilated. - Right ventricle: The cavity size was mildly dilated. Systolic   function was  moderately reduced. - Right atrium: The atrium was moderately dilated. - Tricuspid valve: There was moderate regurgitation. - Pulmonary arteries: Systolic pressure was severely increased. PA   peak pressure: 70 mm Hg (S). - Pericardium, extracardiac: A small pericardial effusion was   identified.  Impressions:  - Technically difficult; normal LV systolic function; D shaped   septum suggestive of pulmonary hypertension; biatrial   enlargement; mild RVE with moderately reduced RV function;   moderate TR with severely elevated pulmonary pressure; small   pericardial effusion.  Patient Profile     66 y.o. female with a history of Atrial fib  on amio, GIB on warfarin, pulm fibrosis ?2nd amio>>d/c'd, on home O2, CHADS2VASC=4 (age x 1, female x 1, CHF, HTN), borderline DM, D-CHF, AVM, OA, anemia, HTN, HLD  Assessment & Plan    1. Acute on Chronic diastolic HF: Seems to be improving, lasix was increased to 40mg  q6hr, and added metolazone. Have good UOP 1.7L yesterday. No daily weights noted. Still with significant JVD on exam. Attempted Bipap, but unable tolerated due to claustrophobia. On HFNC -- continue with IV lasix and metolazone. Cr Stable, daily BMET -- echo yesterday noted biatrial enlargement, with mild RVE, moderate TR and severely elevated pulmonary pressure of 70 mmHg. Need to consider RHC to determine degree of pulmonary HTN  2. Afib RVR: Rates better today 90s-110s. Remains on IV dilt, with scheduled IV lopressor. Loaded with IV dig 3/26 and now with PO dig.  No OAC/DCCV due to hemoptysis.   3. HTN: Poorly controlled this admission. On multiple agents, along with IV diuresis. Last few readings are improved.   4. Hypokalemia/Hypomagnesemia: Given oral K+/ and IV mag by primary, will need to closely monitor as she is currently on dig.   5. Pulmonary Fibrosis: Hx of amiodarone toxicity.  -- Followed by Pulmonary in the outpatient setting.   6. Leukocytosis: Trend WBC, no  documented fevers  Signed, Laverda Page, NP  02/18/2017, 9:53 AM    I have seen, examined and evaluated the patient this AM along with Ms.Su Hilt, NP-C.  After reviewing all the available data and chart, we discussed the patients laboratory, study & physical findings as well as symptoms in detail. I agree with her findings, examination as well as impression recommendations as per our discussion.    Seems a bit better today - modest diuresis with Lasix/Metolazone Echo with normal EF - but significantly elevated PA pressures (when compared to 2016) -> ? Related to Afib & dHF plus underlying lung fibrosis. -- I do agree that we may need RHC to fully evaluate, but after d/w CHF Team MD -(Dr. Shirlee Latch), he recommends to continue monitoring CVP with PICC line & diurese until CVP normalizes - then do RHC to determine underlying levels as this may be exaggerated from baseline 2/2 dHF with Afib. -- would consider txfr to [facility] 2H if she does not improve -- checking CoOx - only if low, would we consider milrinone.  HR much improved as are BP with dilt gtt & BB IV + Dig. -- for now [facility] given hemoptysis -- can discuss in future (esp. If we need to consider DCCV).     Bryan Lemma, M.D., M.S. Interventional Cardiologist   Pager # [phone] Phone # [phone] [address] [place], [place] [address]

## 2017-02-18 NOTE — Progress Notes (Addendum)
PROGRESS NOTE    [name]   KCL:[id number]  DOB: [birth year]  DOA: 02/26/17 PCP: [name], MD   Brief Narrative:  [name] is a 66 y.o. female with medical history significant of paroxysmal A. 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   2 wks of dry cough changing to hemoptysis 2-3 days ago (sputum not green or yellow- pure blood),.  Increasing shortness of breath. 2-3 days of ankle edema and orthopnea. States she has been taking daily Lasix 40 mg . Smoker - less than 1/2 ppd now.   Found to have A-fib with RVR and pulmonary edema. Cardizem infusion, IV Digoxin and IV lasix started.  Increasing O2 requirements overnight- now on 15 L with pulse ox in low 90s. RR in 30s.  Cough with pleuritic left sided chest pain.   Subjective: Hemoptysis improved. Left chest pain better as well.  Given Ativan 0.5 last night because she was 'anxious on BiPAP' - became agitated and paranoid. Morphine helped her calm down. BiPAP was removed as she would not tolerate it.   Assessment & Plan:   Principal Problem:   Acute on chronic respiratory failure with hypoxia suspected to be due to 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   - ECHO 2016- EF normal with grade  ECHO - increased Lasix to QID 40 mg IV & added Zaroxyln yesterday- continue current doses for today as she is improvoing   Active Problems:   Atrial fibrillation with RVR   - reestablished with Dr Rennis Golden on 10/17- saw [name] in 20 -3/27- Cardizem infusion,  loaded with IV Dig x 3 doses (0.5, ).25, 0.25) - last dose of 0.2mg   @ 5 AM   - no anticoagulation currently - CHA2DS2-VASc Score 4- declines anticoagulation- as she no longer has  microcytic anemia, would be reasonable to to discuss anticoagulation with her - Cardizem infusion is at 15 mg/hr, IV Metoprolol Q6 hr, Dig- HR in 90s today  Pulmonary fibrisis, Smoker with obstructive lung disease Severe pulm HTN per ECHO 02/17/17 - saw Dr Sherene Sires 6/17 - on Albuterol, Singulair and O2 as mentioned at home - currently no wheezing  - pulm following  h/o Microcytic anemia which has resolved - saw Dr Adela Lank in summer of 2017-- declined work up - tells me she stopped Protonix about 4 mo ago and Iron prior to that - EGD and Colonoscopy in 2014 normal (Dr Elnoria Howard)  Hypokalemia/ Hypomagnesemia - K 2.5 today- replacing aggressively    CKD (chronic kidney disease) stage 3, GFR 30-59 ml/min - following with diuresis  Essential HTN -- Hold Norvasc for now  HLD - Hold Pravastatin for now  Chronic pain - takes Oxycodone for this    DVT prophylaxis: SCDs Code Status: Full code Family Communication:  Disposition Plan: follow in sDU Consultants:   Cardiology Procedures:   PICC  2 D ECHO Technically difficult; normal LV systolic function; D shaped   septum suggestive of pulmonary hypertension; biatrial   enlargement; mild RVE with moderately reduced RV function;   moderate TR with severely elevated pulmonary pressure; small   pericardial effusion.  Antimicrobials:  Anti-infectives    None       Objective: Vitals:   02/18/17 0500 02/18/17 0550  02/18/17 0600 02/18/17 0800  BP: (!) 156/87  (!) 156/78   Pulse:      Resp: 19 (!) 24 (!) 24   Temp:    98.6 F (37 C)  TempSrc:    Oral  SpO2: (!) 89% (!) 89% 92%   Weight:      Height:        Intake/Output Summary (Last 24 hours) at 02/18/17 0817 Last data filed at 02/18/17 0720  Gross per 24 hour  Intake           497.97 ml  Output             2225 ml  Net         -1727.03 ml   Filed Weights   02/17/2017 0857 02/17/2017 1327  Weight: 91.6 kg (202 lb) 95.7 kg (210 lb 15.7 oz)    Examination: General  exam: Appears comfortable  HEENT: PERRLA, oral mucosa moist, no sclera icterus or thrush Respiratory system: bilateral coarse basilar crackles and rhonchi, RR ion 30s, pulse ox 92% on 15 L Cardiovascular system: S1 & S2 heard, IIRR.  No murmurs HR in low 100s Gastrointestinal system: Abdomen soft, non-tender, nondistended. Normal bowel sound. No organomegaly Central nervous system: Alert and oriented. No focal neurological deficits. Extremities: No cyanosis, clubbing + LE edema Skin: No rashes or ulcers Psychiatry:  Mood & affect appropriate.     Data Reviewed: I have personally reviewed following labs and imaging studies  CBC:  Recent Labs Lab 02/05/2017 0944 02/17/17 0600 02/18/17 0448  WBC 14.9* 19.0* 22.2*  NEUTROABS  --  15.4*  --   HGB 16.2* 14.4 15.3*  HCT 47.7* 42.9 44.7  MCV 81.7 80.5 79.5  PLT 270 240 218   Basic Metabolic Panel:  Recent Labs Lab 02/10/2017 0944 02/17/17 0500 02/17/17 0600 02/18/17 0448  NA 134*  --  132* 133*  K 4.0  --  3.2* 2.5*  CL 105  --  102 96*  CO2 16*  --  23 29  GLUCOSE 135*  --  167* 131*  BUN 24*  --  22* 20  CREATININE 1.39*  --  0.94 1.10*  CALCIUM 8.8*  --  7.7* 8.5*  MG  --  1.6*  --  1.7   GFR: Estimated Creatinine Clearance: 58.7 mL/min (A) (by C-G formula based on SCr of 1.1 mg/dL (H)). Liver Function Tests:  Recent Labs Lab 02/17/17 0600  AST 94*  ALT 158*  ALKPHOS 167*  BILITOT 1.8*  PROT 6.6  ALBUMIN 2.8*   No results for input(s): LIPASE, AMYLASE in the last 168 hours. No results for input(s): AMMONIA in the last 168 hours. Coagulation Profile: No results for input(s): INR, PROTIME in the last 168 hours. Cardiac Enzymes:  Recent Labs Lab 02/03/2017 1018 02/17/17 0057  TROPONINI 0.03* 0.03*   BNP (last 3 results)  Recent Labs  02/28/16 1145  PROBNP 837.0*   HbA1C: No results for input(s): HGBA1C in the last 72 hours. CBG: No results for input(s): GLUCAP in the last 168 hours. Lipid  Profile: No results for input(s): CHOL, HDL, LDLCALC, TRIG, CHOLHDL, LDLDIRECT in the last 72 hours. Thyroid Function Tests: No results for input(s): TSH, T4TOTAL, FREET4, T3FREE, THYROIDAB in the last 72 hours. Anemia Panel: No results for input(s): VITAMINB12, FOLATE, FERRITIN, TIBC, IRON, RETICCTPCT in the last 72 hours. Urine analysis:    Component Value Date/Time   COLORURINE AMBER (A) 06/07/2016 0645   APPEARANCEUR CLOUDY (A) 06/07/2016 1610  LABSPEC 1.025 06/07/2016 0645   PHURINE 5.5 06/07/2016 0645   GLUCOSEU NEGATIVE 06/07/2016 0645   HGBUR NEGATIVE 06/07/2016 0645   BILIRUBINUR SMALL (A) 06/07/2016 0645   KETONESUR NEGATIVE 06/07/2016 0645   PROTEINUR NEGATIVE 06/07/2016 0645   UROBILINOGEN 0.2 10/07/2015 1055   NITRITE NEGATIVE 06/07/2016 0645   LEUKOCYTESUR SMALL (A) 06/07/2016 0645   Sepsis Labs: @LABRCNTIP (procalcitonin:4,lacticidven:4) ) Recent Results (from the past 240 hour(s))  MRSA PCR Screening     Status: None   Collection Time: March 10, 2017  1:00 PM  Result Value Ref Range Status   MRSA by PCR NEGATIVE NEGATIVE Final    Comment:        The GeneXpert MRSA Assay (FDA approved for NASAL specimens only), is one component of a comprehensive MRSA colonization surveillance program. It is not intended to diagnose MRSA infection nor to guide or monitor treatment for MRSA infections.          Radiology Studies: Dg Chest Port 1 View  Result Date: 02/18/2017 CLINICAL DATA:  Hemoptysis.  CHF. EXAM: PORTABLE CHEST 1 VIEW COMPARISON:  Chest radiograph yesterday, additional priors FINDINGS: Tip of the right upper extremity PICC in the SVC. Low lung volumes. Stable cardiomegaly and mediastinal contours. Interstitial edema is unchanged from prior exam. Hazy lung base opacities consistent with pleural fluid, stable. No significant interval change. IMPRESSION: No significant interval change in pulmonary edema, cardiomegaly, probable bilateral pleural effusions.  Radiographic findings suggest CHF. Electronically Signed   By: 02/20/2017 M.D.   On: 02/18/2017 04:51   Dg Chest Port 1 View  Result Date: 02/17/2017 CLINICAL DATA:  Shortness of Breath EXAM: PORTABLE CHEST 1 VIEW COMPARISON:  03/10/17 FINDINGS: Cardiac shadow is enlarged. Vascular congestion and interstitial edema is noted similar to that seen on the prior exam. Right-sided PICC line is again identified. No bony abnormality is seen. IMPRESSION: Changes consistent with CHF Electronically Signed   By: 02/18/2017 M.D.   On: 02/17/2017 10:44   Dg Chest Port 1 View  Result Date: 2017/03/10 CLINICAL DATA:  Right PICC placement.  Initial encounter. EXAM: PORTABLE CHEST 1 VIEW COMPARISON:  Chest radiograph performed earlier today at 9:07 a.m. FINDINGS: A right PICC is noted ending about the cavoatrial junction. Bilateral airspace opacities raise concern for pulmonary edema. Underlying small bilateral pleural effusions are noted. No pneumothorax is seen. The cardiomediastinal silhouette is enlarged. No acute osseous abnormalities are identified. IMPRESSION: 1. Right PICC noted ending about the cavoatrial junction. 2. Bilateral airspace opacities raise concern for pulmonary edema. Small bilateral pleural effusions seen. Cardiomegaly. Electronically Signed   By: 02/18/2017 M.D.   On: 03-10-17 22:55   Dg Chest Port 1 View  Result Date: 2017/03/10 CLINICAL DATA:  Sudden onset left chest pain, initial encounter EXAM: PORTABLE CHEST 1 VIEW COMPARISON:  06/07/2016 FINDINGS: Cardiac shadow is enlarged but accentuated by the portable technique. Mild interstitial changes are noted bilaterally which may represent some interstitial edema. Chronic changes are seen in the bases bilaterally stable from the prior exam. No acute bony abnormality is noted. IMPRESSION: Chronic changes in the bases bilaterally. Changes of mild interstitial edema appear Electronically Signed   By: 06/09/2016 M.D.   On: 2017-03-10  09:17      Scheduled Meds: . Chlorhexidine Gluconate Cloth  6 each Topical Daily  . digoxin  0.125 mg Oral Daily  . furosemide  40 mg Intravenous Q6H  . metolazone  2.5 mg Oral Daily  . metoprolol  5 mg Intravenous Q6H  .  montelukast  10 mg Oral QHS  . nicotine  7 mg Transdermal QHS  . pantoprazole  40 mg Oral QHS  . potassium chloride  40 mEq Oral Q4H  . pravastatin  20 mg Oral Daily  . sodium chloride flush  10-40 mL Intracatheter Q12H  . sodium chloride flush  3 mL Intravenous Q12H   Continuous Infusions: . diltiazem (CARDIZEM) infusion 10 mg/hr (02/18/17 0444)     LOS: 2 days    Time spent in minutes: 45 min    Jakson Delpilar, MD [facility] Pager: [email or web address] Password [facility] 02/18/2017, 8:17 AM

## 2017-02-18 NOTE — Progress Notes (Signed)
CRITICAL VALUE ALERT  Critical value received:  K 2.5  Date of notification:   02/18/2017  Time of notification:   0520  Critical value read back:Yes.    Nurse who received alert:   Elizebeth Koller RN   MD notified (1st page):   Craige Cotta  Time of first page:   0520  Responding MD:   Craige Cotta

## 2017-02-19 LAB — BASIC METABOLIC PANEL
Anion gap: 9 (ref 5–15)
BUN: 20 mg/dL (ref 6–20)
CO2: 35 mmol/L — AB (ref 22–32)
CREATININE: 0.94 mg/dL (ref 0.44–1.00)
Calcium: 8.2 mg/dL — ABNORMAL LOW (ref 8.9–10.3)
Chloride: 90 mmol/L — ABNORMAL LOW (ref 101–111)
GFR calc Af Amer: >60 mL/min (ref >60)
GFR calc non Af Amer: >60 mL/min (ref >60)
GLUCOSE: 130 mg/dL — AB (ref 65–99)
Potassium: 3 mmol/L — ABNORMAL LOW (ref 3.5–5.1)
Sodium: 134 mmol/L — ABNORMAL LOW (ref 135–145)

## 2017-02-19 LAB — ANTI-SMITH ANTIBODY

## 2017-02-19 LAB — ANTI-SCLERODERMA ANTIBODY

## 2017-02-19 LAB — CBC
HCT: 42 % (ref 36.0–46.0)
Hemoglobin: 14.4 g/dL (ref 12.0–15.0)
MCH: 27.3 pg (ref 26.0–34.0)
MCHC: 34.3 g/dL (ref 30.0–36.0)
MCV: 79.5 fL (ref 78.0–100.0)
PLATELETS: 229 10*3/uL (ref 150–400)
RBC: 5.28 MIL/uL — ABNORMAL HIGH (ref 3.87–5.11)
RDW: 14.9 % (ref 11.5–15.5)
WBC: 20.3 10*3/uL — ABNORMAL HIGH (ref 4.0–10.5)

## 2017-02-19 LAB — ANTI-DNA ANTIBODY, DOUBLE-STRANDED

## 2017-02-19 LAB — GLOMERULAR BASEMENT MEMBRANE ANTIBODIES: GBM AB: 3 U (ref 0–20)

## 2017-02-19 LAB — CYCLIC CITRUL PEPTIDE ANTIBODY, IGG/IGA: CCP ANTIBODIES IGG/IGA: 36 U — AB (ref 0–19)

## 2017-02-19 LAB — MAGNESIUM: Magnesium: 1.7 mg/dL (ref 1.7–2.4)

## 2017-02-19 LAB — PROCALCITONIN: Procalcitonin: 1.18 ng/mL

## 2017-02-19 LAB — RHEUMATOID FACTOR: Rhuematoid fact SerPl-aCnc: 15.7 IU/mL — ABNORMAL HIGH (ref 0.0–13.9)

## 2017-02-19 MED ORDER — IPRATROPIUM BROMIDE 0.02 % IN SOLN
0.5000 mg | Freq: Three times a day (TID) | RESPIRATORY_TRACT | Status: DC
  Administered 2017-02-19 (×2): 0.5 mg via RESPIRATORY_TRACT
  Filled 2017-02-19 (×3): qty 2.5

## 2017-02-19 MED ORDER — LEVALBUTEROL HCL 0.63 MG/3ML IN NEBU
0.6300 mg | INHALATION_SOLUTION | Freq: Three times a day (TID) | RESPIRATORY_TRACT | Status: DC
Start: 2017-02-19 — End: 2017-02-20
  Administered 2017-02-19 (×2): 0.63 mg via RESPIRATORY_TRACT
  Filled 2017-02-19 (×3): qty 3

## 2017-02-19 MED ORDER — BISACODYL 10 MG RE SUPP
10.0000 mg | Freq: Every day | RECTAL | Status: DC | PRN
  Administered 2017-02-19: 10 mg via RECTAL
  Filled 2017-02-19 (×2): qty 1

## 2017-02-19 MED ORDER — POTASSIUM CHLORIDE CRYS ER 20 MEQ PO TBCR
40.0000 meq | EXTENDED_RELEASE_TABLET | Freq: Three times a day (TID) | ORAL | Status: AC
  Administered 2017-02-19 – 2017-02-22 (×12): 40 meq via ORAL
  Filled 2017-02-19 (×12): qty 2

## 2017-02-19 MED ORDER — POTASSIUM CHLORIDE CRYS ER 20 MEQ PO TBCR
40.0000 meq | EXTENDED_RELEASE_TABLET | ORAL | Status: AC
  Administered 2017-02-19 (×2): 40 meq via ORAL
  Filled 2017-02-19 (×2): qty 2

## 2017-02-19 MED ORDER — MAGNESIUM OXIDE 400 (241.3 MG) MG PO TABS
400.0000 mg | ORAL_TABLET | Freq: Two times a day (BID) | ORAL | Status: DC
  Administered 2017-02-19 – 2017-02-25 (×14): 400 mg via ORAL
  Filled 2017-02-19 (×14): qty 1

## 2017-02-19 MED ORDER — MAGNESIUM SULFATE 2 GM/50ML IV SOLN
2.0000 g | Freq: Once | INTRAVENOUS | Status: AC
  Administered 2017-02-19: 2 g via INTRAVENOUS
  Filled 2017-02-19: qty 50

## 2017-02-19 NOTE — Progress Notes (Addendum)
PROGRESS NOTE    Joy LUEDERS   ZDG:[id number]  DOB: 07-12-1951  DOA: 03-04-17 PCP: [name], MD   Brief Narrative:  [name] is a 66 y.o. female with medical history significant of paroxysmal A. 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   2 wks of dry cough changing to hemoptysis 2-3 days ago (sputum not green or yellow- pure blood),.  Increasing shortness of breath. Cough with pleuritic left sided chest pain.  2-3 days of ankle edema and orthopnea. States she has been taking daily Lasix 40 mg . Smoker - less than 1/2 ppd now.   Found to have A-fib with RVR and pulmonary edema. Cardizem infusion, IV Digoxin and IV lasix started.  Quite anxious on 3/27. BiPAP used for dyspnea but she could not tolerate it in the day or at night. Given Ativan at night for anxiety (0.5mg ) and she became confused and agitated immediately.  Respiratory status has been steadily improving. HR is reasonably controlled on current management.    Subjective: Less cough but still coughing up blood. It is more pink now rather then bright red. No chest pain. She has not had a BM since admission. No nausea or vomiting. No other new complaints.   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 (see below)  - ECHO 2016- EF normal with grade 1 d CHF, mild to mod pulm HTN -  ECHO 3/27- noted below- severe pulm HTN with mod reduced RV function, bi atrial enlargement - 3/27- increased Lasix to QID 40 mg IV & added Zaroxyln    - 4 L neg balance - weight 95 >> 93 kg - attempting to wean from 15 L HFC - CVP 25 on 3/27- now is  12-13 - R heart cath once adequately diureses and CVP < 10   Active Problems:   Atrial fibrillation with RVR   - reestablished with Dr Rennis Golden on 10/17- saw [name] before that -3/27- Cardizem infusion,  loaded with IV Dig x 3 doses (0.5, 0.25, 0.25)   - CHA2DS2-VASc Score 4- s  declines anticoagulation- as she no longer has microcytic anemia, would be reasonable to to discuss anticoagulation with her  - no anticoagulation currently due to hemoptysis  - Cardizem infusion is at 15 mg/hr, IV Metoprolol Q6 hr, Dig   Chronic resp failure Smoker with obstructive lung disease 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 Severe pulm HTN per ECHO 02/17/17 - saw Dr Sherene Sires 6/17- suspected pulm fibrosis from Amio toxicity (see notes on 2/17)  - on Albuterol, Singulair and O2 as mentioned at home- currently no wheezing  - pulm following and autoimmune w/u for pulm HTN ordered - R heart cath planned by cards  h/o Microcytic anemia which has resolved - saw Dr Adela Lank in summer of 2017-- declined work up - tells me she stopped Protonix about 4 mo ago and Iron prior to that - EGD and Colonoscopy in 2014 normal (Dr Elnoria Howard)  Hypokalemia/ Hypomagnesemia -  replacing aggressively- given 200 meq of K+ in past 24 hrs - give another 2 gm mag IV and start 400 BID (has constipation so this might  help)  CKD 2-3 - stable with diuresis   Essential HTN -- Hold Norvasc for now  HLD - Hold Pravastatin for now  Chronic pain - takes Oxycodone for this PRN - will d/c IV Morphine and allow her to use her orals  Constipation - Dulcolax suppository today- starting Mg Oxide as well- follow  DVT prophylaxis: SCDs Code Status: Full code Family Communication:  Disposition Plan: follow in SDU- OOB to chair today- wean O2 as able- I turned her down to 10 L today & pulse ox was in low 90s. Consultants:   Cardiology  PCCM Procedures:   PICC  2 D ECHO Technically difficult; normal LV systolic function; D shaped    septum suggestive of pulmonary hypertension; biatrial   enlargement; mild RVE with moderately reduced RV function;   moderate TR with severely elevated pulmonary pressure; small   pericardial effusion.  Antimicrobials:  Anti-infectives    None       Objective: Vitals:   02/19/17 0832 02/19/17 0900 02/19/17 1000 02/19/17 1024  BP:  (!) 150/90 139/75   Pulse:    (!) 117  Resp:  20 20 (!) 30  Temp:      TempSrc:      SpO2: 92% (!) 84% 93% (!) 86%  Weight:      Height:        Intake/Output Summary (Last 24 hours) at 02/19/17 1115 Last data filed at 02/19/17 0350  Gross per 24 hour  Intake          1595.42 ml  Output             3505 ml  Net         -1909.58 ml   Filed Weights   02-25-2017 0857 02/25/17 1327 02/19/17 0500  Weight: 91.6 kg (202 lb) 95.7 kg (210 lb 15.7 oz) 93 kg (205 lb 0.4 oz)    Examination: General exam: Appears comfortable  HEENT: PERRLA, oral mucosa moist, no sclera icterus or thrush Respiratory system: bilateral coarse basilar crackles, RR ion 20s, pulse ox 92% on 10 L Cardiovascular system: S1 & S2 heard, IIRR.  No murmurs HR in low 100s Gastrointestinal system: Abdomen soft, non-tender, nondistended. Normal bowel sound. No organomegaly Central nervous system: Alert and oriented. No focal neurological deficits. Extremities: No cyanosis, clubbing + LE edema Skin: No rashes or ulcers Psychiatry:  Mood & affect appropriate.     Data Reviewed: I have personally reviewed following labs and imaging studies  CBC:  Recent Labs Lab 02/25/2017 0944 02/17/17 0600 02/18/17 0448 02/19/17 0530  WBC 14.9* 19.0* 22.2* 20.3*  NEUTROABS  --  15.4*  --   --   HGB 16.2* 14.4 15.3* 14.4  HCT 47.7* 42.9 44.7 42.0  MCV 81.7 80.5 79.5 79.5  PLT 270 240 218 229   Basic Metabolic Panel:  Recent Labs Lab 2017-02-25 0944 02/17/17 0500 02/17/17 0600 02/18/17 0448 02/18/17 1653 02/19/17 0530  NA 134*  --  132* 133*  --  134*  K 4.0  --  3.2* 2.5* 2.8* 3.0*   CL 105  --  102 96*  --  90*  CO2 16*  --  23 29  --  35*  GLUCOSE 135*  --  167* 131*  --  130*  BUN 24*  --  22* 20  --  20  CREATININE 1.39*  --  0.94 1.10*  --  0.94  CALCIUM 8.8*  --  7.7* 8.5*  --  8.2*  MG  --  1.6*  --  1.7  --  1.7   GFR: Estimated Creatinine Clearance: 67.7 mL/min (by C-G formula based on SCr of 0.94 mg/dL). Liver Function Tests:  Recent Labs Lab 02/17/17 0600  AST 94*  ALT 158*  ALKPHOS 167*  BILITOT 1.8*  PROT 6.6  ALBUMIN 2.8*   No results for input(s): LIPASE, AMYLASE in the last 168 hours. No results for input(s): AMMONIA in the last 168 hours. Coagulation Profile: No results for input(s): INR, PROTIME in the last 168 hours. Cardiac Enzymes:  Recent Labs Lab 2017-02-23 1018 02/17/17 0057  TROPONINI 0.03* 0.03*   BNP (last 3 results)  Recent Labs  02/28/16 1145  PROBNP 837.0*   HbA1C: No results for input(s): HGBA1C in the last 72 hours. CBG: No results for input(s): GLUCAP in the last 168 hours. Lipid Profile: No results for input(s): CHOL, HDL, LDLCALC, TRIG, CHOLHDL, LDLDIRECT in the last 72 hours. Thyroid Function Tests: No results for input(s): TSH, T4TOTAL, FREET4, T3FREE, THYROIDAB in the last 72 hours. Anemia Panel: No results for input(s): VITAMINB12, FOLATE, FERRITIN, TIBC, IRON, RETICCTPCT in the last 72 hours. Urine analysis:    Component Value Date/Time   COLORURINE STRAW (A) 02/18/2017 1729   APPEARANCEUR CLEAR 02/18/2017 1729   LABSPEC 1.005 02/18/2017 1729   PHURINE 5.0 02/18/2017 1729   GLUCOSEU NEGATIVE 02/18/2017 1729   HGBUR NEGATIVE 02/18/2017 1729   BILIRUBINUR NEGATIVE 02/18/2017 1729   KETONESUR NEGATIVE 02/18/2017 1729   PROTEINUR NEGATIVE 02/18/2017 1729   UROBILINOGEN 0.2 10/07/2015 1055   NITRITE NEGATIVE 02/18/2017 1729   LEUKOCYTESUR NEGATIVE 02/18/2017 1729   Sepsis Labs: @LABRCNTIP (procalcitonin:4,lacticidven:4) ) Recent Results (from the past 240 hour(s))  MRSA PCR Screening      Status: None   Collection Time: 2017/02/23  1:00 PM  Result Value Ref Range Status   MRSA by PCR NEGATIVE NEGATIVE Final    Comment:        The GeneXpert MRSA Assay (FDA approved for NASAL specimens only), is one component of a comprehensive MRSA colonization surveillance program. It is not intended to diagnose MRSA infection nor to guide or monitor treatment for MRSA infections.          Radiology Studies: Dg Chest Port 1 View  Result Date: 02/18/2017 CLINICAL DATA:  Hemoptysis.  CHF. EXAM: PORTABLE CHEST 1 VIEW COMPARISON:  Chest radiograph yesterday, additional priors FINDINGS: Tip of the right upper extremity PICC in the SVC. Low lung volumes. Stable cardiomegaly and mediastinal contours. Interstitial edema is unchanged from prior exam. Hazy lung base opacities consistent with pleural fluid, stable. No significant interval change. IMPRESSION: No significant interval change in pulmonary edema, cardiomegaly, probable bilateral pleural effusions. Radiographic findings suggest CHF. Electronically Signed   By: 02/20/2017 M.D.   On: 02/18/2017 04:51      Scheduled Meds: . Chlorhexidine Gluconate Cloth  6 each Topical Daily  . digoxin  0.125 mg Oral Daily  . furosemide  40 mg Intravenous Q6H  . ipratropium  0.5 mg Nebulization TID  . levalbuterol  0.63 mg Nebulization TID  . metolazone  2.5 mg Oral Daily  . metoprolol  5 mg Intravenous Q6H  . montelukast  10 mg Oral QHS  . nicotine  7 mg Transdermal QHS  . pantoprazole  40 mg Oral QHS  . potassium chloride  40 mEq Oral TID  . pravastatin  20 mg Oral Daily  . sodium chloride flush  10-40 mL Intracatheter Q12H  . sodium chloride flush  3 mL Intravenous  Q12H   Continuous Infusions: . diltiazem (CARDIZEM) infusion 13 mg/hr (02/19/17 0949)     LOS: 3 days    Time spent in minutes: 45 min    Sanae Willetts, MD [facility] Pager: [email or web address] Password [email or web address] 02/19/2017, 11:15 AM

## 2017-02-19 NOTE — Progress Notes (Addendum)
`  Progress Note  Patient Name: [name] Date of Encounter: 02/19/2017  Primary Cardiologist: Hilty  Subjective   Resiting comfortably - breathing improved, but still on High flow O2. Continues to diurese well.  Inpatient Medications    Scheduled Meds: . Chlorhexidine Gluconate Cloth  6 each Topical Daily  . digoxin  0.125 mg Oral Daily  . furosemide  40 mg Intravenous Q6H  . metolazone  2.5 mg Oral Daily  . metoprolol  5 mg Intravenous Q6H  . montelukast  10 mg Oral QHS  . nicotine  7 mg Transdermal QHS  . pantoprazole  40 mg Oral QHS  . potassium chloride  40 mEq Oral Q2H  . potassium chloride  40 mEq Oral TID  . pravastatin  20 mg Oral Daily  . sodium chloride flush  10-40 mL Intracatheter Q12H  . sodium chloride flush  3 mL Intravenous Q12H   Continuous Infusions: . diltiazem (CARDIZEM) infusion 15 mg/hr (02/19/17 0800)   PRN Meds: bisacodyl, hydrALAZINE, levalbuterol, morphine injection, ondansetron **OR** ondansetron (ZOFRAN) IV, oxyCODONE-acetaminophen, promethazine, sodium chloride flush   Vital Signs    Vitals:   02/19/17 0600 02/19/17 0700 02/19/17 0800 02/19/17 0832  BP: 138/63 135/72 137/72   Pulse:      Resp:  20 20   Temp:  99.3 F (37.4 C)    TempSrc:  Oral    SpO2: 96% 95% 95% 92%  Weight:      Height:        CVP 13 -14 mmHg  CoOx 65 (low - but not enough to consider inotropes)  Intake/Output Summary (Last 24 hours) at 02/19/17 0854 Last data filed at 02/19/17 1219  Gross per 24 hour  Intake          1785.42 ml  Output             3505 ml  Net         -1719.58 ml   Filed Weights   02/18/2017 0857 02/19/2017 1327 02/19/17 0500  Weight: 202 lb (91.6 kg) 210 lb 15.7 oz (95.7 kg) 205 lb 0.4 oz (93 kg)    Telemetry    Afib (90-110s) - Personally Reviewed  ECG    N/A - Personally Reviewed  Physical Exam   General: Well developed, well nourished, female appearing in no acute distress. On HFNC; resting comfortably Head:  Normocephalic, atraumatic.  Neck: Supple without bruits, ++ JVD. Lungs:  Resp regular and unlabored, faint crackles - non-labored. Heart: Irreg Irreg - rate now improved S1, S2, no S3, S4, or murmur; no rub. Abdomen: Soft, non-tender, non-distended with normoactive bowel sounds. No hepatomegaly. No rebound/guarding. No obvious abdominal masses. Extremities: ++ clubbing, no cyanosis, trace LE L>R edema - down from yesterday. Distal pedal pulses are 2+ bilaterally. Neuro: Alert and oriented X 3. Moves all extremities spontaneously. Psych: Normal affect.  Labs    Chemistry  Recent Labs Lab 02/17/17 0600 02/18/17 0448 02/18/17 1653 02/19/17 0530  NA 132* 133*  --  134*  K 3.2* 2.5* 2.8* 3.0*  CL 102 96*  --  90*  CO2 23 29  --  35*  GLUCOSE 167* 131*  --  130*  BUN 22* 20  --  20  CREATININE 0.94 1.10*  --  0.94  CALCIUM 7.7* 8.5*  --  8.2*  PROT 6.6  --   --   --   ALBUMIN 2.8*  --   --   --   AST 94*  --   --   --  ALT 158*  --   --   --   ALKPHOS 167*  --   --   --   BILITOT 1.8*  --   --   --   GFRNONAA >60 51*  --  >60  GFRAA >60 59*  --  >60  ANIONGAP 7 8  --  9     Hematology  Recent Labs Lab 02/17/17 0600 02/18/17 0448 02/19/17 0530  WBC 19.0* 22.2* 20.3*  RBC 5.33* 5.62* 5.28*  HGB 14.4 15.3* 14.4  HCT 42.9 44.7 42.0  MCV 80.5 79.5 79.5  MCH 27.0 27.2 27.3  MCHC 33.6 34.2 34.3  RDW 15.2 15.1 14.9  PLT 240 218 229    Cardiac Enzymes  Recent Labs Lab 02/18/2017 1018 02/17/17 0057  TROPONINI 0.03* 0.03*   No results for input(s): TROPIPOC in the last 168 hours.   BNP  Recent Labs Lab Feb 18, 2017 1108  BNP 501.5*     DDimer No results for input(s): DDIMER in the last 168 hours.    Radiology    Dg Chest Port 1 View  Result Date: 02/18/2017 CLINICAL DATA:  Hemoptysis.  CHF. EXAM: PORTABLE CHEST 1 VIEW COMPARISON:  Chest radiograph yesterday, additional priors FINDINGS: Tip of the right upper extremity PICC in the SVC. Low lung volumes. Stable  cardiomegaly and mediastinal contours. Interstitial edema is unchanged from prior exam. Hazy lung base opacities consistent with pleural fluid, stable. No significant interval change. IMPRESSION: No significant interval change in pulmonary edema, cardiomegaly, probable bilateral pleural effusions. Radiographic findings suggest CHF. Electronically Signed   By: Rubye Oaks M.D.   On: 02/18/2017 04:51   Dg Chest Port 1 View  Result Date: 02/17/2017 CLINICAL DATA:  Shortness of Breath EXAM: PORTABLE CHEST 1 VIEW COMPARISON:  02/18/2017 FINDINGS: Cardiac shadow is enlarged. Vascular congestion and interstitial edema is noted similar to that seen on the prior exam. Right-sided PICC line is again identified. No bony abnormality is seen. IMPRESSION: Changes consistent with CHF Electronically Signed   By: Alcide Clever M.D.   On: 02/17/2017 10:44    Cardiac Studies   TTE: 02/17/17   Study Conclusions  - Left ventricle: The cavity size was normal. Wall thickness was normal. Systolic function was normal. The estimated ejection   fraction was in the range of 50% to 55%. Wall motion was normal;  there were no regional wall motion abnormalities. - Ventricular septum: The contour showed diastolic flattening and systolic flattening. - Left atrium: The atrium was moderately dilated. - Right ventricle: The cavity size was mildly dilated. Systolicfunction was moderately reduced. - Right atrium: The atrium was moderately dilated. - Tricuspid valve: There was moderate regurgitation. - Pulmonary arteries: Systolic pressure was severely increased. PA peak pressure: 70 mm Hg (S). - Pericardium, extracardiac: A small pericardial effusion was identified.  Impressions:  - Technically difficult; normal LV systolic function; D shaped eptum suggestive of pulmonary hypertension; biatrial   enlargement; mild RVE with moderately reduced RV function;  moderate TR with severely elevated pulmonary pressure; small    pericardial effusion.  Patient Profile     66 y.o. female with a history of Atrial fib on amio, GIB on warfarin, pulm fibrosis ?2nd amio>>d/c'd, on home O2, CHADS2VASC=4 (age x 1, female x 1, CHF, HTN), borderline DM, D-CHF, AVM, OA, anemia, HTN, HLD  Assessment & Plan    1. Acute on Chronic diastolic HF: Seems to be improving, lasix was increased to 40mg  q6hr, and added metolazone. Have good UOP 1.8L yesterday.  No daily weights noted. Still with significant JVD on exam. unable to tolerate BiPAP. On HFNC  Better diuresis with IV lasix and metolazone. Cr Stable, daily BMET - will need to reduce amount of lasix as BUN/Cr increase, but for now continue until CVP <10  CoOx not low enough to warrant inotrope/milrinone  echo on 3/27Highly suggestive of Severe Pulm HTN - following CVP with diuresis.  after d/w CHF Team MD -(Dr. Shirlee Latch), he recommends to continue monitoring CVP with PICC line & diurese until CVP normalizes - then do RHC to determine underlying levels as this may be exaggerated from baseline 2/2 dHF with Afib.  2. Afib RVR: Rates better today 90s-110s. Remains on IV dilt, with scheduled IV lopressor.   Loaded with IV dig 3/26 and now with PO dig.    No OAC/DCCV due to hemoptysis.   3. HTN: Poorly controlled this admission. On multiple agents, along with IV diuresis. Last few readings are improved.   4. Hypokalemia/Hypomagnesemia:   Given oral K+/ and IV mag by primary, will need to closely monitor as she is currently on dig.   Tried to order IV K+, but Epic will not allow it.   Do not want IVF with K b/c we are diuresing.  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  -- Followed by Pulmonary in the outpatient setting.   6. Leukocytosis: Trend WBC, no documented fevers  Signed, Bryan Lemma, MD ., M.S.  02/19/2017, 8:54 AM

## 2017-02-20 LAB — COOXEMETRY PANEL
Carboxyhemoglobin: 1.7 % — ABNORMAL HIGH (ref 0.5–1.5)
Methemoglobin: 0.5 % (ref 0.0–1.5)
O2 Saturation: 56 %
TOTAL HEMOGLOBIN: 15 g/dL (ref 12.0–16.0)

## 2017-02-20 LAB — ANCA TITERS: C-ANCA: <1:20 {titer}

## 2017-02-20 LAB — BASIC METABOLIC PANEL
Anion gap: 8 (ref 5–15)
BUN: 20 mg/dL (ref 6–20)
CALCIUM: 8.3 mg/dL — AB (ref 8.9–10.3)
CO2: 39 mmol/L — AB (ref 22–32)
CREATININE: 1.1 mg/dL — AB (ref 0.44–1.00)
Chloride: 86 mmol/L — ABNORMAL LOW (ref 101–111)
GFR calc Af Amer: 59 mL/min — ABNORMAL LOW (ref >60)
GFR calc non Af Amer: 51 mL/min — ABNORMAL LOW (ref >60)
Glucose, Bld: 138 mg/dL — ABNORMAL HIGH (ref 65–99)
Potassium: 3 mmol/L — ABNORMAL LOW (ref 3.5–5.1)
SODIUM: 133 mmol/L — AB (ref 135–145)

## 2017-02-20 LAB — MAGNESIUM: Magnesium: 2 mg/dL (ref 1.7–2.4)

## 2017-02-20 MED ORDER — DILTIAZEM HCL 60 MG PO TABS
90.0000 mg | ORAL_TABLET | Freq: Four times a day (QID) | ORAL | Status: DC
  Administered 2017-02-20 – 2017-02-22 (×8): 90 mg via ORAL
  Filled 2017-02-20 (×8): qty 1

## 2017-02-20 MED ORDER — FUROSEMIDE 10 MG/ML IJ SOLN
40.0000 mg | Freq: Two times a day (BID) | INTRAMUSCULAR | Status: DC
  Administered 2017-02-20: 40 mg via INTRAVENOUS
  Filled 2017-02-20: qty 4

## 2017-02-20 NOTE — Progress Notes (Signed)
Date:  February 20, 2017 Chart reviewed for concurrent status and case management needs. Will continue to follow patient progress. Remains on hfnc at 11l/min. Discharge Planning: following for needs Expected discharge date: [id number] Marcelle Smiling, BSN, [place], [place]   [phone]

## 2017-02-20 NOTE — Progress Notes (Signed)
`  Progress Note  Patient Name: [name] Date of Encounter: 02/20/2017  Primary Cardiologist: Hilty  Patient Profile     66 y.o. female with a history of Atrial fib on amio, GIB on warfarin, pulm fibrosis ?2nd amio>>d/c'd, on home O2, CHADS2VASC=4 (age x 1, female x 1, CHF, HTN), borderline DM, D-CHF, AVM, OA, anemia, HTN, HLD.  -- admitted with acute on chronic DHF in setting of Afib RVR, complicated by frank hemoptysis (which excludes th possibility of DCCV)  Subjective   Resiting comfortably - breathing improved, but still on High flow O2. Continues to diurese well. Breathing notably improved A bit constipated las PM - had abdominal pain, improved after she finally had BM.  Inpatient Medications    Scheduled Meds: . Chlorhexidine Gluconate Cloth  6 each Topical Daily  . digoxin  0.125 mg Oral Daily  . furosemide  40 mg Intravenous BID  . ipratropium  0.5 mg Nebulization TID  . levalbuterol  0.63 mg Nebulization TID  . magnesium oxide  400 mg Oral BID  . metolazone  2.5 mg Oral Daily  . metoprolol  5 mg Intravenous Q6H  . montelukast  10 mg Oral QHS  . nicotine  7 mg Transdermal QHS  . pantoprazole  40 mg Oral QHS  . potassium chloride  40 mEq Oral TID  . pravastatin  20 mg Oral Daily  . sodium chloride flush  10-40 mL Intracatheter Q12H  . sodium chloride flush  3 mL Intravenous Q12H   Continuous Infusions: . diltiazem (CARDIZEM) infusion 13 mg/hr (02/20/17 0600)   PRN Meds: bisacodyl, hydrALAZINE, levalbuterol, ondansetron **OR** ondansetron (ZOFRAN) IV, oxyCODONE-acetaminophen, promethazine, sodium chloride flush   Vital Signs    Vitals:   02/20/17 0200 02/20/17 0326 02/20/17 0400 02/20/17 0600  BP: 134/72  (!) 145/86 136/89  Pulse:      Resp: 19  (!) 30 16  Temp:  98.9 F (37.2 C)    TempSrc:  Axillary    SpO2: 92%  94% 92%  Weight:   205 lb 0.4 oz (93 kg)   Height:        CVP 11  mmHg  CoOx ordered  Intake/Output Summary (Last 24  hours) at 02/20/17 0725 Last data filed at 02/20/17 0600  Gross per 24 hour  Intake           562.63 ml  Output             3025 ml  Net         -2462.37 ml   Filed Weights   01/31/2017 1327 02/19/17 0500 02/20/17 0400  Weight: 210 lb 15.7 oz (95.7 kg) 205 lb 0.4 oz (93 kg) 205 lb 0.4 oz (93 kg)    Telemetry    Afib (90-110s) with PVCs/aberrantly conducted beats - Personally Reviewed  ECG    N/A - Personally Reviewed  Physical Exam   General: Well developed, well nourished, female appearing in no acute distress. On HFNC; resting comfortably Head: Normocephalic, atraumatic.  Neck: Supple without bruits, ++ JVD - pulsatile from Afib but less prominent (~8-10 cmH2O - c/w CVP read). Lungs:  Resp regular and unlabored, faint bibasal crackles - non-labored. Heart: Irreg Irreg - rate now improved S1& S2- normal., no S3, S4, or murmur; no rub. Abdomen: Soft, non-tender, non-distended with normoactive bowel sounds. No hepatomegaly. No rebound/guarding. No obvious abdominal masses. Extremities: + clubbing, no cyanosis, trace LE L>R edema - down from yesterday. Distal pedal pulses are 2+ bilaterally. Neuro:  Alert and oriented X 3. Moves all extremities spontaneously. Psych: Normal affect.  Labs    Chemistry  Recent Labs Lab 02/17/17 0600 02/18/17 0448 02/18/17 1653 02/19/17 0530 02/20/17 0523  NA 132* 133*  --  134* 133*  K 3.2* 2.5* 2.8* 3.0* 3.0*  CL 102 96*  --  90* 86*  CO2 23 29  --  35* 39*  GLUCOSE 167* 131*  --  130* 138*  BUN 22* 20  --  20 20  CREATININE 0.94 1.10*  --  0.94 1.10*  CALCIUM 7.7* 8.5*  --  8.2* 8.3*  PROT 6.6  --   --   --   --   ALBUMIN 2.8*  --   --   --   --   AST 94*  --   --   --   --   ALT 158*  --   --   --   --   ALKPHOS 167*  --   --   --   --   BILITOT 1.8*  --   --   --   --   GFRNONAA >60 51*  --  >60 51*  GFRAA >60 59*  --  >60 59*  ANIONGAP 7 8  --  9 8     Hematology  Recent Labs Lab 02/17/17 0600 02/18/17 0448  02/19/17 0530  WBC 19.0* 22.2* 20.3*  RBC 5.33* 5.62* 5.28*  HGB 14.4 15.3* 14.4  HCT 42.9 44.7 42.0  MCV 80.5 79.5 79.5  MCH 27.0 27.2 27.3  MCHC 33.6 34.2 34.3  RDW 15.2 15.1 14.9  PLT 240 218 229    Cardiac Enzymes  Recent Labs Lab 02/06/2017 1018 02/17/17 0057  TROPONINI 0.03* 0.03*   No results for input(s): TROPIPOC in the last 168 hours.   BNP  Recent Labs Lab 02/14/2017 1108  BNP 501.5*     DDimer No results for input(s): DDIMER in the last 168 hours.    Radiology    No results found.  Cardiac Studies   TTE: 3/27/18L EF 50-55%. No RWMA.  D shaped Ventricular septum - diastolic & systolic flattening with RV dilation & moderately reduced Fxn.  PAP ~70 mmHg.  Bilateral Atriae - mildly dilated   Assessment & Plan    1. Acute on Chronic diastolic HF /Pulmonary HTN:  echo on 3/27Highly suggestive of Severe Pulm HTN - following CVP with diuresis.Seems to be improving, lasix was increased to 40mg  q6hr, and added metolazone. ~2.5 L  UOP 1.8L yesterday - ~6.3 L total.  Daily weights cannot be correct noted. CoOx 65 not low enough to warrant inotrope/milrinone Still with significant JVD on exam.   Better diuresis with IV lasix and metolazone. Cr Stable, daily BMET - will need to reduce amount of lasix to BID as BUN/Cr increasing,   CVP is now 11 & goal is <10  after d/w CHF Team MD -(Dr. ), he recommends to continue monitoring CVP with PICC line & diurese until CVP normalizes (~10 mmHg) - then do RHC to determine underlying levels as this may be exaggerated from baseline 2/2 dHF with Afib.  2. Afib RVR: Rates better today 90s-110s. Remains on IV dilt, with scheduled IV lopressor.   At this point, can start to consolidate to PO Diltiazem (starting with 90 mg q6hr - would change to q12 hr or q24 hr med prior to d/c)   Continue IV BB until we see stable HR on PO dilt, then change to to PO  BB.  Loaded with IV dig 3/26 and now with PO dig.    No OAC/DCCV due to  hemoptysis.   3. HTN: Poorly controlled / labile this admission - better today (no PRN Hydralazine since 3/28).   On multiple agents, along with IV diuresis. Last few readings are improved.   4. Hypokalemia/Hypomagnesemia: Having a hard time keeping up with K wasting through diuresis (should improve with reduced diuretic dosing)  Given oral K+/ and IV mag by primary, will need to closely monitor as she is currently on dig.   Tried to order IV K+, but Epic will not allow it.   Do not want IVF with K b/c we are diuresing.  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 ? If Pulm HTN may be partly primary due to fibrosis. -- Followed by Pulmonary in the outpatient setting.   6. Leukocytosis: Trend WBC, no documented fevers - per [employer]   If able to successfully get her off of IV dilt gtt, can txfr to Tele    Signed, Bryan Lemma, MD ., M.S.  02/20/2017, 7:25 AM

## 2017-02-20 NOTE — Progress Notes (Signed)
Patient Demographics:    [name], is a 66 y.o. female, DOB - 11/05/1951, WJX:[id number]  Admit date - 02/13/2017   Admitting Physician [name], MD  Outpatient Primary MD for the patient is OSEI-BONSU,GEORGE, MD  LOS - 4   Chief Complaint  Patient presents with  . Cough  . Chest Pain        Subjective:    [name] today has no fevers, no emesis,  No chest pain,  Shortness of breath is better, still coughing some   Assessment  & Plan :    Principal Problem:   Acute on chronic respiratory failure with hypoxia (HCC) Active Problems:   Essential hypertension   Acute on chronic diastolic CHF (congestive heart failure) (HCC)   Schizophrenia (HCC)   CKD (chronic kidney disease) stage 3, GFR 30-59 ml/min   Dyspnea   Bronchiectasis without acute exacerbation (HCC)   PAF (paroxysmal atrial fibrillation) (HCC)   Amiodarone pulmonary toxicity   Atrial fibrillation with RVR (HCC)   Hemoptysis   LD (liver disease)   Pulmonary hypertension  1)Acute respiratory failure with hypoxia suspected to be due to [facility]  -  improving, continue Lasix IV 40 twice a day, Zaroxolyn 2.5 mg by mouth daily, R heart cath once adequately diureses and CVP < 10 . Unable to Wean off oxygen, patient is on very high flow oxygen at this time  2)Afib with RVR- CHA2DS2 = 4, declines anticoagulation previously due to anemia and blood loss, currently not a candidate for anticoagulation due to hemoptysis, wean off IV Cardizem, start oral Cardizem, continue digoxin and metoprolol. We follow with Dr. Rennis Golden  3)HFpEF- recent echocardiogram (02/17/17) with EF of 50-55%, improving acute on chronic diastolic CHF is elevation, please see diuretic management as above in #1  4) Severe PULMonary HTN-- patient with history of pulmonary fibrosis, reasonably secondary to amiodarone toxicity, patient has COPD at baseline as well,   R heart cath once adequately diureses and CVP < 10 , echocardiogram from 02/17/2017 confirms severe pulmonary hypertension and moderately reduced RV function, continue bronchodilators  5)H/o Microcytic anemia-  resolved anemia, follow H&H,  EGD and Colonoscopy in 2014 normal (Dr Elnoria Howard), she stopped her PPI 4 months ago,  follow H&H  6)CKD 2 to 3- monitor renal function and electrolytes closely with diuresis, replace potassium and magnesium and recheck  7)HTN- stable, amlodipine on hold  DVT prophylaxis: SCDs Code Status: Full code Family Communication:  Disposition Plan:  still requiring very high flow oxygen Consultants:   Cardiology  PCCM   Lab Results  Component Value Date   PLT 229 02/19/2017    Inpatient Medications  Scheduled Meds: . Chlorhexidine Gluconate Cloth  6 each Topical Daily  . digoxin  0.125 mg Oral Daily  . diltiazem  90 mg Oral Q6H  . furosemide  40 mg Intravenous BID  . ipratropium  0.5 mg Nebulization TID  . levalbuterol  0.63 mg Nebulization TID  . magnesium oxide  400 mg Oral BID  . metolazone  2.5 mg Oral Daily  . metoprolol  5 mg Intravenous Q6H  . montelukast  10 mg Oral QHS  . nicotine  7 mg Transdermal QHS  . pantoprazole  40 mg Oral QHS  . potassium  chloride  40 mEq Oral TID  . pravastatin  20 mg Oral Daily  . sodium chloride flush  10-40 mL Intracatheter Q12H  . sodium chloride flush  3 mL Intravenous Q12H   Continuous Infusions: PRN Meds:.bisacodyl, hydrALAZINE, levalbuterol, ondansetron **OR** ondansetron (ZOFRAN) IV, oxyCODONE-acetaminophen, promethazine, sodium chloride flush    Anti-infectives    None        Objective:   Vitals:   02/20/17 0910 02/20/17 1000 02/20/17 1100 02/20/17 1200  BP:  (!) 153/92  (!) 141/83  Pulse:      Resp: (!) 31 (!) 24 20 (!) 32  Temp:    98.5 F (36.9 C)  TempSrc:    Oral  SpO2: 92% 91% 92% 91%  Weight:      Height:        Wt Readings from Last 3 Encounters:  02/20/17 93 kg (205 lb  0.4 oz)  11/19/16 91.6 kg (202 lb)  08/27/16 91.2 kg (201 lb)     Intake/Output Summary (Last 24 hours) at 02/20/17 1314 Last data filed at 02/20/17 1100  Gross per 24 hour  Intake           570.51 ml  Output             2825 ml  Net         -2254.49 ml     Physical Exam  Gen:- Awake Alert,  In no apparent distress  HEENT:- [place].AT, No sclera icterus Neck-Supple Neck,No JVD,.  Lungs- bibasilar rales, few scattered wheezes in the upper lung fields CV- S1, S2 normal Abd-  +ve B.Sounds, Abd Soft, No tenderness,    Extremity/Skin:- Neg Homan's    Data Review:   Micro Results Recent Results (from the past 240 hour(s))  MRSA PCR Screening     Status: None   Collection Time: 03-02-17  1:00 PM  Result Value Ref Range Status   MRSA by PCR NEGATIVE NEGATIVE Final    Comment:        The GeneXpert MRSA Assay (FDA approved for NASAL specimens only), is one component of a comprehensive MRSA colonization surveillance program. It is not intended to diagnose MRSA infection nor to guide or monitor treatment for MRSA infections.     Radiology Reports Dg Chest Port 1 View  Result Date: 02/18/2017 CLINICAL DATA:  Hemoptysis.  CHF. EXAM: PORTABLE CHEST 1 VIEW COMPARISON:  Chest radiograph yesterday, additional priors FINDINGS: Tip of the right upper extremity PICC in the SVC. Low lung volumes. Stable cardiomegaly and mediastinal contours. Interstitial edema is unchanged from prior exam. Hazy lung base opacities consistent with pleural fluid, stable. No significant interval change. IMPRESSION: No significant interval change in pulmonary edema, cardiomegaly, probable bilateral pleural effusions. Radiographic findings suggest CHF. Electronically Signed   By: Rubye Oaks M.D.   On: 02/18/2017 04:51   Dg Chest Port 1 View  Result Date: 02/17/2017 CLINICAL DATA:  Shortness of Breath EXAM: PORTABLE CHEST 1 VIEW COMPARISON:  March 02, 2017 FINDINGS: Cardiac shadow is enlarged. Vascular  congestion and interstitial edema is noted similar to that seen on the prior exam. Right-sided PICC line is again identified. No bony abnormality is seen. IMPRESSION: Changes consistent with CHF Electronically Signed   By: Alcide Clever M.D.   On: 02/17/2017 10:44   Dg Chest Port 1 View  Result Date: 03/02/17 CLINICAL DATA:  Right PICC placement.  Initial encounter. EXAM: PORTABLE CHEST 1 VIEW COMPARISON:  Chest radiograph performed earlier today at 9:07 a.m. FINDINGS: A right PICC  is noted ending about the cavoatrial junction. Bilateral airspace opacities raise concern for pulmonary edema. Underlying small bilateral pleural effusions are noted. No pneumothorax is seen. The cardiomediastinal silhouette is enlarged. No acute osseous abnormalities are identified. IMPRESSION: 1. Right PICC noted ending about the cavoatrial junction. 2. Bilateral airspace opacities raise concern for pulmonary edema. Small bilateral pleural effusions seen. Cardiomegaly. Electronically Signed   By: Roanna Raider M.D.   On: 02/08/2017 22:55   Dg Chest Port 1 View  Result Date: 01/27/2017 CLINICAL DATA:  Sudden onset left chest pain, initial encounter EXAM: PORTABLE CHEST 1 VIEW COMPARISON:  06/07/2016 FINDINGS: Cardiac shadow is enlarged but accentuated by the portable technique. Mild interstitial changes are noted bilaterally which may represent some interstitial edema. Chronic changes are seen in the bases bilaterally stable from the prior exam. No acute bony abnormality is noted. IMPRESSION: Chronic changes in the bases bilaterally. Changes of mild interstitial edema appear Electronically Signed   By: Alcide Clever M.D.   On: 02/04/2017 09:17     CBC  Recent Labs Lab 02/01/2017 0944 02/17/17 0600 02/18/17 0448 02/19/17 0530  WBC 14.9* 19.0* 22.2* 20.3*  HGB 16.2* 14.4 15.3* 14.4  HCT 47.7* 42.9 44.7 42.0  PLT 270 240 218 229  MCV 81.7 80.5 79.5 79.5  MCH 27.7 27.0 27.2 27.3  MCHC 34.0 33.6 34.2 34.3  RDW 15.6*  15.2 15.1 14.9  LYMPHSABS  --  1.3  --   --   MONOABS  --  1.8*  --   --   EOSABS  --  0.1  --   --   BASOSABS  --  0.0  --   --     Chemistries   Recent Labs Lab 02/09/2017 0944 02/17/17 0500 02/17/17 0600 02/18/17 0448 02/18/17 1653 02/19/17 0530 02/20/17 0523  NA 134*  --  132* 133*  --  134* 133*  K 4.0  --  3.2* 2.5* 2.8* 3.0* 3.0*  CL 105  --  102 96*  --  90* 86*  CO2 16*  --  23 29  --  35* 39*  GLUCOSE 135*  --  167* 131*  --  130* 138*  BUN 24*  --  22* 20  --  20 20  CREATININE 1.39*  --  0.94 1.10*  --  0.94 1.10*  CALCIUM 8.8*  --  7.7* 8.5*  --  8.2* 8.3*  MG  --  1.6*  --  1.7  --  1.7 2.0  AST  --   --  94*  --   --   --   --   ALT  --   --  158*  --   --   --   --   ALKPHOS  --   --  167*  --   --   --   --   BILITOT  --   --  1.8*  --   --   --   --    ------------------------------------------------------------------------------------------------------------------ No results for input(s): CHOL, HDL, LDLCALC, TRIG, CHOLHDL, LDLDIRECT in the last 72 hours.  Lab Results  Component Value Date   HGBA1C 6.2 (H) 06/28/2013   ------------------------------------------------------------------------------------------------------------------ No results for input(s): TSH, T4TOTAL, T3FREE, THYROIDAB in the last 72 hours.  Invalid input(s): FREET3 ------------------------------------------------------------------------------------------------------------------ No results for input(s): VITAMINB12, FOLATE, FERRITIN, TIBC, IRON, RETICCTPCT in the last 72 hours.  Coagulation profile No results for input(s): INR, PROTIME in the last 168 hours.  No results for input(s): DDIMER in  the last 72 hours.  Cardiac Enzymes  Recent Labs Lab Feb 20, 2017 1018 02/17/17 0057  TROPONINI 0.03* 0.03*   ------------------------------------------------------------------------------------------------------------------    Component Value Date/Time   BNP 501.5 (H) Feb 20, 2017 1108       [name] M.D on 02/20/2017 at 1:14 PM  Between 7am to 7pm - Pager - [phone]  After 7pm go to [email or web address] - password [email or web address]  [facility] -  Office  [phone]  Dragon dictation system was used to create this note, attempts have been made to correct errors, however presence of uncorrected errors is not a reflection quality of care provided

## 2017-02-21 DIAGNOSIS — T462X5A Adverse effect of other antidysrhythmic drugs, initial encounter: Secondary | ICD-10-CM

## 2017-02-21 LAB — BASIC METABOLIC PANEL
ANION GAP: 10 (ref 5–15)
BUN: 23 mg/dL — ABNORMAL HIGH (ref 6–20)
CALCIUM: 8.4 mg/dL — AB (ref 8.9–10.3)
CO2: 38 mmol/L — AB (ref 22–32)
Chloride: 83 mmol/L — ABNORMAL LOW (ref 101–111)
Creatinine, Ser: 0.97 mg/dL (ref 0.44–1.00)
GFR calc Af Amer: >60 mL/min (ref >60)
GFR, EST NON AFRICAN AMERICAN: 60 mL/min — AB (ref >60)
Glucose, Bld: 109 mg/dL — ABNORMAL HIGH (ref 65–99)
Potassium: 3.4 mmol/L — ABNORMAL LOW (ref 3.5–5.1)
Sodium: 131 mmol/L — ABNORMAL LOW (ref 135–145)

## 2017-02-21 LAB — BLOOD GAS, VENOUS
ACID-BASE EXCESS: 14.2 mmol/L — AB (ref 0.0–2.0)
Bicarbonate: 40 mmol/L — ABNORMAL HIGH (ref 20.0–28.0)
O2 Saturation: 62.4 %
PCO2 VEN: 51.6 mmHg (ref 44.0–60.0)
PH VEN: 7.501 — AB (ref 7.250–7.430)
Patient temperature: 98.6
pO2, Ven: 33.9 mmHg (ref 32.0–45.0)

## 2017-02-21 LAB — CBC
HEMATOCRIT: 41.9 % (ref 36.0–46.0)
Hemoglobin: 14.1 g/dL (ref 12.0–15.0)
MCH: 26.9 pg (ref 26.0–34.0)
MCHC: 33.7 g/dL (ref 30.0–36.0)
MCV: 80 fL (ref 78.0–100.0)
Platelets: 262 10*3/uL (ref 150–400)
RBC: 5.24 MIL/uL — ABNORMAL HIGH (ref 3.87–5.11)
RDW: 14.9 % (ref 11.5–15.5)
WBC: 16.6 10*3/uL — AB (ref 4.0–10.5)

## 2017-02-21 LAB — BRAIN NATRIURETIC PEPTIDE: B Natriuretic Peptide: 759.3 pg/mL — ABNORMAL HIGH (ref 0.0–100.0)

## 2017-02-21 MED ORDER — SIMETHICONE 80 MG PO CHEW
80.0000 mg | CHEWABLE_TABLET | Freq: Four times a day (QID) | ORAL | Status: DC | PRN
Start: 2017-02-21 — End: 2017-02-26
  Administered 2017-02-21: 80 mg via ORAL
  Filled 2017-02-21: qty 1

## 2017-02-21 MED ORDER — HYDROMORPHONE HCL 1 MG/ML IJ SOLN
1.0000 mg | Freq: Once | INTRAMUSCULAR | Status: AC
Start: 2017-02-21 — End: 2017-02-21
  Administered 2017-02-21: 1 mg via INTRAVENOUS
  Filled 2017-02-21: qty 1

## 2017-02-21 MED ORDER — FUROSEMIDE 10 MG/ML IJ SOLN
80.0000 mg | Freq: Two times a day (BID) | INTRAMUSCULAR | Status: DC
  Administered 2017-02-21 – 2017-02-23 (×5): 80 mg via INTRAVENOUS
  Filled 2017-02-21 (×5): qty 8

## 2017-02-21 MED ORDER — POTASSIUM CHLORIDE CRYS ER 20 MEQ PO TBCR
40.0000 meq | EXTENDED_RELEASE_TABLET | Freq: Once | ORAL | Status: AC
  Administered 2017-02-21: 40 meq via ORAL
  Filled 2017-02-21: qty 2

## 2017-02-21 NOTE — Progress Notes (Signed)
Progress Note  Patient Name: [name] Date of Encounter: 02/21/2017  Primary Cardiologist: [facility]  Subjective   Overall maybe a little better. Tachypneic at rest. Has an occasional sharp discomfort in LUQ if she bends forward or coughs. Hemoptysis is "a little lighter". Good appetite. On O2 8L. Rate control fair, heart rate in 90s for the most part. CVP 14-16 - but she cannot really lie fully flat, this may be an underestimation. Venous CoOx is lower, will repeat today.  UO fairly mediocre despite metolazone-loop diuretic combo, but net negative 360 mL. Weight increased. Renal function stable.  Inpatient Medications    Scheduled Meds: . Chlorhexidine Gluconate Cloth  6 each Topical Daily  . digoxin  0.125 mg Oral Daily  . diltiazem  90 mg Oral Q6H  . furosemide  40 mg Intravenous BID  . magnesium oxide  400 mg Oral BID  . metolazone  2.5 mg Oral Daily  . metoprolol  5 mg Intravenous Q6H  . montelukast  10 mg Oral QHS  . nicotine  7 mg Transdermal QHS  . pantoprazole  40 mg Oral QHS  . potassium chloride  40 mEq Oral TID  . potassium chloride  40 mEq Oral Once  . pravastatin  20 mg Oral Daily  . sodium chloride flush  10-40 mL Intracatheter Q12H  . sodium chloride flush  3 mL Intravenous Q12H   Continuous Infusions:  PRN Meds: bisacodyl, levalbuterol, ondansetron **OR** ondansetron (ZOFRAN) IV, oxyCODONE-acetaminophen, promethazine, sodium chloride flush   Vital Signs    Vitals:   02/21/17 0400 02/21/17 0500 02/21/17 0524 02/21/17 0600  BP: (!) 160/79  (!) 160/79 (!) 160/83  Pulse:      Resp:    17  Temp: 98.5 F (36.9 C)     TempSrc: Oral     SpO2: 91%   90%  Weight:  94.8 kg (208 lb 15.9 oz)    Height:        Intake/Output Summary (Last 24 hours) at 02/21/17 0805 Last data filed at 02/21/17 0400  Gross per 24 hour  Intake           651.88 ml  Output             1051 ml  Net          -399.12 ml   Filed Weights   02/19/17 0500 02/20/17 0400  02/21/17 0500  Weight: 93 kg (205 lb 0.4 oz) 93 kg (205 lb 0.4 oz) 94.8 kg (208 lb 15.9 oz)    Telemetry    AF with frequent PVCs - Personally Reviewed  Physical Exam  Rapid breathing, but able to converse and flash a brief smile. GEN: No acute distress.   Neck: 8-10 cm JVD Cardiac: irregular, no murmurs, rubs, or gallops.  Respiratory: dry crackles bilaterally. GI: Soft, nontender, non-distended  MS: 1-2+ bilateral edema; No deformity. Neuro:  Nonfocal  Psych: Normal affect   Labs    Chemistry Recent Labs Lab 02/17/17 0600  02/19/17 0530 02/20/17 0523 02/21/17 0500  NA 132*  < > 134* 133* 131*  K 3.2*  < > 3.0* 3.0* 3.4*  CL 102  < > 90* 86* 83*  CO2 23  < > 35* 39* 38*  GLUCOSE 167*  < > 130* 138* 109*  BUN 22*  < > 20 20 23*  CREATININE 0.94  < > 0.94 1.10* 0.97  CALCIUM 7.7*  < > 8.2* 8.3* 8.4*  PROT 6.6  --   --   --   --  ALBUMIN 2.8*  --   --   --   --   AST 94*  --   --   --   --   ALT 158*  --   --   --   --   ALKPHOS 167*  --   --   --   --   BILITOT 1.8*  --   --   --   --   GFRNONAA >60  < > >60 51* 60*  GFRAA >60  < > >60 59* >60  ANIONGAP 7  < > 9 8 10   < > = values in this interval not displayed.   Hematology Recent Labs Lab 02/18/17 0448 02/19/17 0530 02/21/17 0500  WBC 22.2* 20.3* 16.6*  RBC 5.62* 5.28* 5.24*  HGB 15.3* 14.4 14.1  HCT 44.7 42.0 41.9  MCV 79.5 79.5 80.0  MCH 27.2 27.3 26.9  MCHC 34.2 34.3 33.7  RDW 15.1 14.9 14.9  PLT 218 229 262    Cardiac Enzymes Recent Labs Lab 03-15-17 1018 02/17/17 0057  TROPONINI 0.03* 0.03*   No results for input(s): TROPIPOC in the last 168 hours.   BNP Recent Labs Lab Mar 15, 2017 1108  BNP 501.5*    Cardiac Studies   ECHO 02/17/17  - Left ventricle: The cavity size was normal. Wall thickness was   normal. Systolic function was normal. The estimated ejection   fraction was in the range of 50% to 55%. Wall motion was normal;   there were no regional wall motion abnormalities. -  Ventricular septum: The contour showed diastolic flattening and   systolic flattening. - Left atrium: The atrium was moderately dilated. - Right ventricle: The cavity size was mildly dilated. Systolic   function was moderately reduced. - Right atrium: The atrium was moderately dilated. - Tricuspid valve: There was moderate regurgitation. - Pulmonary arteries: Systolic pressure was severely increased. PA   peak pressure: 70 mm Hg (S). - Pericardium, extracardiac: A small pericardial effusion was   identified.  Impressions:  - Technically difficult; normal LV systolic function; D shaped   septum suggestive of pulmonary hypertension; biatrial   enlargement; mild RVE with moderately reduced RV function;   moderate TR with severely elevated pulmonary pressure; small   pericardial effusion.  Patient Profile     66 y.o. female with pulmonary fibrosis (amio-related?), severe pulmonary HTN, diastolic heart failure with acute exacerbation, persistent atrial fibrillation with ongoing mild hemoptysis, HTN, HLP, mild DM, anemia and history of AVMs  Assessment & Plan    1. CHF (HFpEF) - acute exacerbation with biventricular manifestations. Poor UO and apparent reduction in venous O2 sat suggest lower CO, but renal function remains normal and exam and invasive monitoring suggest she remains markedly fluid overloaded. Check Co-ox and BNP. Increase diuretic dose. If Co-ox lower, may need milrinone. On fluid restriction for hyponatremia. 2. PAH - severe, difficult to know to what degree it is due to pulmonary fibrosis versus left heart failure. Will need a heart cath after diuresis, possibly Monday. 3. Afib - rate control is fair (at rest). Unable to anticoagulate with ongoing hemoptysis (and hx of anemia due to GI bleeding). 4. Acute respiratory failure/hypoxia - due to CHF and pulmonary fibrosis 5. Hypokalemia -  Improved. Normal Mg. 6. Pulmonary fibrosis - presumed to be amio related 7. Elevated  WBC - trending down.  Signed, Thursday, MD  02/21/2017, 8:05 AM

## 2017-02-21 NOTE — Progress Notes (Deleted)
Pt still reports gas pains, no orders as of yet-paged Dr. Mariea Clonts.

## 2017-02-21 NOTE — Progress Notes (Signed)
Patient Demographics:    [name], is a 66 y.o. female, DOB - January 28, 1951, WUJ:[id number]  Admit date - March 05, 2017   Admitting Physician [name], MD  Outpatient Primary MD for the patient is OSEI-BONSU,GEORGE, MD  LOS - 5   Chief Complaint  Patient presents with  . Cough  . Chest Pain        Subjective:    [name] today has no fevers, no emesis,  No chest pain,  Sob persist   Assessment  & Plan :    Principal Problem:   Acute on chronic respiratory failure with hypoxia (HCC) Active Problems:   Essential hypertension   Acute on chronic diastolic CHF (congestive heart failure) (HCC)   Schizophrenia (HCC)   CKD (chronic kidney disease) stage 3, GFR 30-59 ml/min   Dyspnea   Bronchiectasis without acute exacerbation (HCC)   PAF (paroxysmal atrial fibrillation) (HCC)   Amiodarone pulmonary toxicity   Atrial fibrillation with RVR (HCC)   Hemoptysis   LD (liver disease)   Pulmonary hypertension  1)Acute respiratory failure with hypoxia suspected to be due to [facility] -  improving, cardiologist has increased Lasix IV 80 twice a day, Zaroxolyn 2.5 mg by mouth daily, Rt heart cath Tentatively 02/23/17  once adequately diureses and CVP <10 (currently CVP 16) . Unable to Wean off oxygen, patient is on very high flow oxygen at this time  2)Afib with RVR- CHA2DS2 = 4, declined anticoagulation previously due to anemia and blood loss, currently not a candidate for anticoagulation due to hemoptysis, off IV Cardizem,  c/n oral Cardizem 90 mg q 6 hrs, continue digoxin and metoprolol 5 mg q 6 hrs. We follow with Dr. Rennis Golden post d/c  3)HFpEF- recent echocardiogram (02/17/17) with EF of 50-55%, improving acute on chronic diastolic CHF is exacerbation, please see diuretic management as above in #1  4) Severe PULMonary HTN-- patient with history of pulmonary fibrosis, reasonably secondary to  amiodarone toxicity, patient has COPD at baseline as well,   Rt heart cath Tentatively 02/23/17  once adequately diureses and CVP <10 (currently CVP 16) , echocardiogram from 02/17/2017 confirms severe pulmonary hypertension and moderately reduced RV function, continue bronchodilators  5)H/o Microcytic anemia-  resolved anemia, follow H&H,  EGD and Colonoscopy in 2014 normal (Dr Elnoria Howard), she stopped her PPI 4 months ago,  follow H&H  6)CKD 2 to 3- monitor renal function and electrolytes closely with diuresis, replace potassium and magnesium and recheck  7)HTN- stable, amlodipine on hold  DVT prophylaxis:SCDs Code Status:Full code Family Communication: Disposition Plan: still requiring very high flow oxygen Consultants:  Cardiology  PCCM     Lab Results  Component Value Date   PLT 262 02/21/2017    Inpatient Medications  Scheduled Meds: . Chlorhexidine Gluconate Cloth  6 each Topical Daily  . digoxin  0.125 mg Oral Daily  . diltiazem  90 mg Oral Q6H  . furosemide  80 mg Intravenous BID  . magnesium oxide  400 mg Oral BID  . metolazone  2.5 mg Oral Daily  . metoprolol  5 mg Intravenous Q6H  . montelukast  10 mg Oral QHS  . nicotine  7 mg Transdermal QHS  . pantoprazole  40 mg Oral QHS  . potassium chloride  40 mEq Oral TID  . pravastatin  20 mg Oral Daily  . sodium chloride flush  10-40 mL Intracatheter Q12H  . sodium chloride flush  3 mL Intravenous Q12H   Continuous Infusions: PRN Meds:.bisacodyl, levalbuterol, ondansetron **OR** ondansetron (ZOFRAN) IV, oxyCODONE-acetaminophen, promethazine, simethicone, sodium chloride flush    Anti-infectives    None        Objective:   Vitals:   02/21/17 0835 02/21/17 1000 02/21/17 1140 02/21/17 1200  BP:  (!) 159/81  (!) 141/107  Pulse:      Resp:  (!) 28  20  Temp: 98.6 F (37 C)  98.7 F (37.1 C)   TempSrc: Oral  Oral   SpO2:  92%  92%  Weight:      Height:        Wt Readings from Last 3 Encounters:    02/21/17 94.8 kg (208 lb 15.9 oz)  11/19/16 91.6 kg (202 lb)  08/27/16 91.2 kg (201 lb)     Intake/Output Summary (Last 24 hours) at 02/21/17 1433 Last data filed at 02/21/17 1224  Gross per 24 hour  Intake              930 ml  Output             1176 ml  Net             -246 ml   Physical Exam  Gen:- Awake Alert,  In no apparent distress  HEENT:- [place].AT, No sclera icterus Nose Center at 5 L/min Neck-Supple Neck,No JVD,.  Lungs-  Bibasilar rales CV- S1, S2 normal, irregular tachycardic Abd-  +ve B.Sounds, Abd Soft, No tenderness,    Extremity/Skin:- 2+ pitting  edema,      Data Review:   Micro Results Recent Results (from the past 240 hour(s))  MRSA PCR Screening     Status: None   Collection Time: 03-17-17  1:00 PM  Result Value Ref Range Status   MRSA by PCR NEGATIVE NEGATIVE Final    Comment:        The GeneXpert MRSA Assay (FDA approved for NASAL specimens only), is one component of a comprehensive MRSA colonization surveillance program. It is not intended to diagnose MRSA infection nor to guide or monitor treatment for MRSA infections.     Radiology Reports Dg Chest Port 1 View  Result Date: 02/18/2017 CLINICAL DATA:  Hemoptysis.  CHF. EXAM: PORTABLE CHEST 1 VIEW COMPARISON:  Chest radiograph yesterday, additional priors FINDINGS: Tip of the right upper extremity PICC in the SVC. Low lung volumes. Stable cardiomegaly and mediastinal contours. Interstitial edema is unchanged from prior exam. Hazy lung base opacities consistent with pleural fluid, stable. No significant interval change. IMPRESSION: No significant interval change in pulmonary edema, cardiomegaly, probable bilateral pleural effusions. Radiographic findings suggest CHF. Electronically Signed   By: Rubye Oaks M.D.   On: 02/18/2017 04:51   Dg Chest Port 1 View  Result Date: 02/17/2017 CLINICAL DATA:  Shortness of Breath EXAM: PORTABLE CHEST 1 VIEW COMPARISON:  2017/03/17 FINDINGS: Cardiac shadow  is enlarged. Vascular congestion and interstitial edema is noted similar to that seen on the prior exam. Right-sided PICC line is again identified. No bony abnormality is seen. IMPRESSION: Changes consistent with CHF Electronically Signed   By: Alcide Clever M.D.   On: 02/17/2017 10:44   Dg Chest Port 1 View  Result Date: March 17, 2017 CLINICAL DATA:  Right PICC placement.  Initial encounter. EXAM: PORTABLE CHEST 1 VIEW COMPARISON:  Chest radiograph performed  earlier today at 9:07 a.m. FINDINGS: A right PICC is noted ending about the cavoatrial junction. Bilateral airspace opacities raise concern for pulmonary edema. Underlying small bilateral pleural effusions are noted. No pneumothorax is seen. The cardiomediastinal silhouette is enlarged. No acute osseous abnormalities are identified. IMPRESSION: 1. Right PICC noted ending about the cavoatrial junction. 2. Bilateral airspace opacities raise concern for pulmonary edema. Small bilateral pleural effusions seen. Cardiomegaly. Electronically Signed   By: Roanna Raider M.D.   On: 03-16-2017 22:55   Dg Chest Port 1 View  Result Date: 2017-03-16 CLINICAL DATA:  Sudden onset left chest pain, initial encounter EXAM: PORTABLE CHEST 1 VIEW COMPARISON:  06/07/2016 FINDINGS: Cardiac shadow is enlarged but accentuated by the portable technique. Mild interstitial changes are noted bilaterally which may represent some interstitial edema. Chronic changes are seen in the bases bilaterally stable from the prior exam. No acute bony abnormality is noted. IMPRESSION: Chronic changes in the bases bilaterally. Changes of mild interstitial edema appear Electronically Signed   By: Alcide Clever M.D.   On: 16-Mar-2017 09:17     CBC  Recent Labs Lab Mar 16, 2017 0944 02/17/17 0600 02/18/17 0448 02/19/17 0530 02/21/17 0500  WBC 14.9* 19.0* 22.2* 20.3* 16.6*  HGB 16.2* 14.4 15.3* 14.4 14.1  HCT 47.7* 42.9 44.7 42.0 41.9  PLT 270 240 218 229 262  MCV 81.7 80.5 79.5 79.5 80.0    MCH 27.7 27.0 27.2 27.3 26.9  MCHC 34.0 33.6 34.2 34.3 33.7  RDW 15.6* 15.2 15.1 14.9 14.9  LYMPHSABS  --  1.3  --   --   --   MONOABS  --  1.8*  --   --   --   EOSABS  --  0.1  --   --   --   BASOSABS  --  0.0  --   --   --     Chemistries   Recent Labs Lab 02/17/17 0500 02/17/17 0600 02/18/17 0448 02/18/17 1653 02/19/17 0530 02/20/17 0523 02/21/17 0500  NA  --  132* 133*  --  134* 133* 131*  K  --  3.2* 2.5* 2.8* 3.0* 3.0* 3.4*  CL  --  102 96*  --  90* 86* 83*  CO2  --  23 29  --  35* 39* 38*  GLUCOSE  --  167* 131*  --  130* 138* 109*  BUN  --  22* 20  --  20 20 23*  CREATININE  --  0.94 1.10*  --  0.94 1.10* 0.97  CALCIUM  --  7.7* 8.5*  --  8.2* 8.3* 8.4*  MG 1.6*  --  1.7  --  1.7 2.0  --   AST  --  94*  --   --   --   --   --   ALT  --  158*  --   --   --   --   --   ALKPHOS  --  167*  --   --   --   --   --   BILITOT  --  1.8*  --   --   --   --   --    ------------------------------------------------------------------------------------------------------------------ No results for input(s): CHOL, HDL, LDLCALC, TRIG, CHOLHDL, LDLDIRECT in the last 72 hours.  Lab Results  Component Value Date   HGBA1C 6.2 (H) 06/28/2013   ------------------------------------------------------------------------------------------------------------------ No results for input(s): TSH, T4TOTAL, T3FREE, THYROIDAB in the last 72 hours.  Invalid input(s): FREET3 ------------------------------------------------------------------------------------------------------------------ No results for input(s):  VITAMINB12, FOLATE, FERRITIN, TIBC, IRON, RETICCTPCT in the last 72 hours.  Coagulation profile No results for input(s): INR, PROTIME in the last 168 hours.  No results for input(s): DDIMER in the last 72 hours.  Cardiac Enzymes  Recent Labs Lab 2017-03-07 1018 02/17/17 0057  TROPONINI 0.03* 0.03*    ------------------------------------------------------------------------------------------------------------------    Component Value Date/Time   BNP 759.3 (H) 02/21/2017 0930     Amany Rando M.D on 02/21/2017 at 2:33 PM  Between 7am to 7pm - Pager - [phone]  After 7pm go to [email or web address] - password [email or web address]  [facility] -  Office  [phone]  Dragon dictation system was used to create this note, attempts have been made to correct errors, however presence of uncorrected errors is not a reflection quality of care provided

## 2017-02-21 NOTE — Progress Notes (Signed)
Pt reports gas pains, belching.  Requested simethicone from Dr. Mariea Clonts.

## 2017-02-22 LAB — BASIC METABOLIC PANEL
ANION GAP: 8 (ref 5–15)
BUN: 28 mg/dL — ABNORMAL HIGH (ref 6–20)
CHLORIDE: 87 mmol/L — AB (ref 101–111)
CO2: 38 mmol/L — AB (ref 22–32)
Calcium: 8.8 mg/dL — ABNORMAL LOW (ref 8.9–10.3)
Creatinine, Ser: 0.95 mg/dL (ref 0.44–1.00)
GFR calc non Af Amer: >60 mL/min (ref >60)
GLUCOSE: 134 mg/dL — AB (ref 65–99)
Potassium: 4 mmol/L (ref 3.5–5.1)
Sodium: 133 mmol/L — ABNORMAL LOW (ref 135–145)

## 2017-02-22 MED ORDER — DILTIAZEM HCL 60 MG PO TABS
120.0000 mg | ORAL_TABLET | Freq: Four times a day (QID) | ORAL | Status: DC
  Administered 2017-02-22 – 2017-02-26 (×15): 120 mg via ORAL
  Filled 2017-02-22 (×15): qty 2

## 2017-02-22 MED ORDER — BISACODYL 10 MG RE SUPP
10.0000 mg | Freq: Once | RECTAL | Status: AC
  Administered 2017-02-22: 10 mg via RECTAL

## 2017-02-22 MED ORDER — LACTULOSE 10 GM/15ML PO SOLN
60.0000 g | Freq: Once | ORAL | Status: AC
  Administered 2017-02-22: 60 g via ORAL
  Filled 2017-02-22: qty 90

## 2017-02-22 NOTE — Progress Notes (Signed)
Progress Note  Patient Name: [name] Date of Encounter: 02/22/2017  Primary Cardiologist: Hilty  Subjective   Breathing better. Still mildly orthopneic, but able to speak without interruption. Good UO, another net 1L diuresis. Weight down >5 lb from peak weight this admission. CVP still high: 14-16. Improved Co-ox 62%. Creat still normal, mild bump in BUN.  Inpatient Medications    Scheduled Meds: . Chlorhexidine Gluconate Cloth  6 each Topical Daily  . digoxin  0.125 mg Oral Daily  . diltiazem  90 mg Oral Q6H  . furosemide  80 mg Intravenous BID  . magnesium oxide  400 mg Oral BID  . metolazone  2.5 mg Oral Daily  . metoprolol  5 mg Intravenous Q6H  . montelukast  10 mg Oral QHS  . nicotine  7 mg Transdermal QHS  . pantoprazole  40 mg Oral QHS  . potassium chloride  40 mEq Oral TID  . pravastatin  20 mg Oral Daily  . sodium chloride flush  10-40 mL Intracatheter Q12H  . sodium chloride flush  3 mL Intravenous Q12H   Continuous Infusions:  PRN Meds: bisacodyl, levalbuterol, ondansetron **OR** ondansetron (ZOFRAN) IV, oxyCODONE-acetaminophen, promethazine, simethicone, sodium chloride flush   Vital Signs    Vitals:   02/22/17 0200 02/22/17 0400 02/22/17 0600 02/22/17 0653  BP: 136/81 (!) 147/96 (!) 133/91 (!) 133/91  Pulse:      Resp: (!) 30 18 (!) 21   Temp:  98.7 F (37.1 C)    TempSrc:  Oral    SpO2: 90% (!) 86% (!) 87%   Weight:  93.3 kg (205 lb 11 oz)    Height:        Intake/Output Summary (Last 24 hours) at 02/22/17 0909 Last data filed at 02/22/17 0859  Gross per 24 hour  Intake              520 ml  Output             1675 ml  Net            -1155 ml   Filed Weights   02/20/17 0400 02/21/17 0500 02/22/17 0400  Weight: 93 kg (205 lb 0.4 oz) 94.8 kg (208 lb 15.9 oz) 93.3 kg (205 lb 11 oz)    Telemetry    AF with RVR (currently 100-110, as high as 140 last night) - Personally Reviewed  Physical Exam  Smiling, tachypneic GEN: No  acute distress.   Neck: 8-10 cm JVD Cardiac: irrregular, no murmurs, rubs, or gallops.  Respiratory: fewer rales, mostly clear. GI: Soft, nontender, non-distended  MS: No edema; No deformity. Neuro:  Nonfocal  Psych: Normal affect   Labs    Chemistry Recent Labs Lab 02/17/17 0600  02/20/17 0523 02/21/17 0500 02/22/17 0435  NA 132*  < > 133* 131* 133*  K 3.2*  < > 3.0* 3.4* 4.0  CL 102  < > 86* 83* 87*  CO2 23  < > 39* 38* 38*  GLUCOSE 167*  < > 138* 109* 134*  BUN 22*  < > 20 23* 28*  CREATININE 0.94  < > 1.10* 0.97 0.95  CALCIUM 7.7*  < > 8.3* 8.4* 8.8*  PROT 6.6  --   --   --   --   ALBUMIN 2.8*  --   --   --   --   AST 94*  --   --   --   --   ALT 158*  --   --   --   --  ALKPHOS 167*  --   --   --   --   BILITOT 1.8*  --   --   --   --   GFRNONAA >60  < > 51* 60* >60  GFRAA >60  < > 59* >60 >60  ANIONGAP 7  < > 8 10 8   < > = values in this interval not displayed.   Hematology Recent Labs Lab 02/18/17 0448 02/19/17 0530 02/21/17 0500  WBC 22.2* 20.3* 16.6*  RBC 5.62* 5.28* 5.24*  HGB 15.3* 14.4 14.1  HCT 44.7 42.0 41.9  MCV 79.5 79.5 80.0  MCH 27.2 27.3 26.9  MCHC 34.2 34.3 33.7  RDW 15.1 14.9 14.9  PLT 218 229 262    Cardiac Enzymes Recent Labs Lab 01/22/2017 1018 02/17/17 0057  TROPONINI 0.03* 0.03*   No results for input(s): TROPIPOC in the last 168 hours.   BNP Recent Labs Lab 02/18/2017 1108 02/21/17 0930  BNP 501.5* 759.3*     Cardiac Studies   ECHO 02/17/17  - Left ventricle: The cavity size was normal. Wall thickness was normal. Systolic function was normal. The estimated ejection fraction was in the range of 50% to 55%. Wall motion was normal; there were no regional wall motion abnormalities. - Ventricular septum: The contour showed diastolic flattening and systolic flattening. - Left atrium: The atrium was moderately dilated. - Right ventricle: The cavity size was mildly dilated. Systolic function was moderately  reduced. - Right atrium: The atrium was moderately dilated. - Tricuspid valve: There was moderate regurgitation. - Pulmonary arteries: Systolic pressure was severely increased. PA peak pressure: 70 mm Hg (S). - Pericardium, extracardiac: A small pericardial effusion was identified.  Impressions:  - Technically difficult; normal LV systolic function; D shaped septum suggestive of pulmonary hypertension; biatrial enlargement; mild RVE with moderately reduced RV function; moderate TR with severely elevated pulmonary pressure; small pericardial effusion.  Patient Profile     66 y.o. female with pulmonary fibrosis (amio-related?), severe pulmonary HTN, diastolic heart failure with acute exacerbation, persistent atrial fibrillation with ongoing mild hemoptysis, HTN, HLP, mild DM, anemia and history of AVMs  Assessment & Plan    1. CHF (HFpEF) - acute exacerbation with biventricular manifestations. Improved UO and Co-ox, but increase in BNP. Continue another 24-48h on current higher diuretic dose, before we proceed to heart cath. On fluid restriction for hyponatremia. 2. PAH - severe, difficult to know to what degree it is due to pulmonary fibrosis versus left heart failure. Will need a heart cath after diuresis, probably Tuesday. We may never get her CVP down, as she appears to have substantial RHF. 3. Afib - rate control is mediocre. Increase diltiazem. Unable to anticoagulate with ongoing hemoptysis (and hx of anemia due to GI bleeding). 4. Acute respiratory failure/hypoxia - due to CHF and pulmonary fibrosis. Improving slowly. 5. Hypokalemia -  resolved. Normal Mg. 6. Pulmonary fibrosis - presumed to be amio related 7. Elevated WBC - trending down.  Signed, Wednesday, MD  02/22/2017, 9:09 AM

## 2017-02-22 NOTE — Progress Notes (Addendum)
Patient Demographics:    [name], is a 66 y.o. female, DOB - 1951-01-20, NPY:[id number]  Admit date - 02/08/2017   Admitting Physician [name], MD  Outpatient Primary MD for the patient is OSEI-BONSU,GEORGE, MD  LOS - 6   Chief Complaint  Patient presents with  . Cough  . Chest Pain        Subjective:    [name] today has no fevers, no emesis,  No chest pain,  Had BM    Assessment  & Plan :    Principal Problem:   Acute on chronic respiratory failure with hypoxia (HCC) Active Problems:   Essential hypertension   Acute on chronic diastolic CHF (congestive heart failure) (HCC)   Schizophrenia (HCC)   CKD (chronic kidney disease) stage 3, GFR 30-59 ml/min   Dyspnea   Bronchiectasis without acute exacerbation (HCC)   PAF (paroxysmal atrial fibrillation) (HCC)   Amiodarone pulmonary toxicity   Atrial fibrillation with RVR (HCC)   Hemoptysis   LD (liver disease)   Pulmonary hypertension  66 y.o. female with pulmonary fibrosis (amio-related?), severe pulmonary HTN, diastolic heart failure with acute exacerbation, persistent atrial fibrillation with ongoing mild hemoptysis, HTN, HLP, mild DM, anemia and history of AVMs   1)Acute respiratory failure with hypoxia suspected to be due to [facility] - improving, cardiologist has increased Lasix IV 80 twice a day, Zaroxolyn 2.5 mg by mouth daily, Rt heart cath Tentatively 02/23/17  once adequately diureses and CVP <10 .  Unable to Wean off oxygen, patient is on very high flow oxygen at this time. History of amiodarone-induced pulmonary fibrosis  2)Afib with RVR- CHA2DS2 = 4, declined anticoagulation previously due to anemia and blood loss, currently not a candidate for anticoagulation due to hemoptysis, off IV Cardizem,  c/n oral Cardizem 90 mg q 6 hrs, continue digoxin and metoprolol 5 mg q 6 hrs. We follow with Dr. Rennis Golden post  d/c  3)HFpEF-Patient with symptoms of biventricular failure, recent echocardiogram (02/17/17)with EF of 50-55%, improving acute on chronic diastolic CHF exacerbation, please see diuretic management as above in #1. Continue fluid restriction  4) Severe PULMonary HTN-- patient with history of pulmonary fibrosis, presumably secondary to amiodarone toxicity, patient has COPD at baseline as well,  Rt heart cath Tentatively 02/23/17  once adequately diureses and CVP <10  echocardiogram from 02/17/2017 confirms severe pulmonary hypertension and moderately reducedRV function, continue bronchodilators  5)H/o Microcytic anemia- resolved anemia, follow H&H, EGD and Colonoscopy in 2014 normal (Dr Dionne Bucy stopped her PPI 4 months ago, follow H&H  6)CKD 2 to 3- monitor renal function and electrolytes closely with diuresis, replace potassium and magnesium and recheck  7)HTN- stable, amlodipine on hold  DVT prophylaxis:SCDs Code Status:Full code Family Communication: Disposition Plan:still requiring very high flow oxygen Consultants:  Cardiology  PCCM  SCDs  Lab Results  Component Value Date   PLT 262 02/21/2017    Inpatient Medications  Scheduled Meds: . Chlorhexidine Gluconate Cloth  6 each Topical Daily  . digoxin  0.125 mg Oral Daily  . diltiazem  120 mg Oral Q6H  . furosemide  80 mg Intravenous BID  . magnesium oxide  400 mg Oral BID  . metolazone  2.5 mg Oral Daily  . metoprolol  5 mg Intravenous Q6H  . montelukast  10 mg Oral QHS  . nicotine  7 mg Transdermal QHS  . pantoprazole  40 mg Oral QHS  . potassium chloride  40 mEq Oral TID  . pravastatin  20 mg Oral Daily  . sodium chloride flush  10-40 mL Intracatheter Q12H  . sodium chloride flush  3 mL Intravenous Q12H   Continuous Infusions: PRN Meds:.bisacodyl, levalbuterol, ondansetron **OR** ondansetron (ZOFRAN) IV, oxyCODONE-acetaminophen, promethazine, simethicone, sodium chloride  flush    Anti-infectives    None        Objective:   Vitals:   02/22/17 1230 02/22/17 1532 02/22/17 1600 02/22/17 1719  BP: (!) 140/94  (!) 169/94 (!) 146/94  Pulse:      Resp:   20   Temp: 97.8 F (36.6 C) 99 F (37.2 C)    TempSrc: Oral Oral    SpO2:   93%   Weight:      Height:        Wt Readings from Last 3 Encounters:  02/22/17 93.3 kg (205 lb 11 oz)  11/19/16 91.6 kg (202 lb)  08/27/16 91.2 kg (201 lb)     Intake/Output Summary (Last 24 hours) at 02/22/17 1901 Last data filed at 02/22/17 1800  Gross per 24 hour  Intake             1047 ml  Output              700 ml  Net              347 ml     Physical Exam  Gen:- Awake Alert,  In no apparent distress  HEENT:- [place].AT, No sclera icterus Neck-Supple Neck, JVD elevated Lungs-  bibasilar rales  CV- S1, S2 normal, irregular Abd-  +ve B.Sounds, Abd Soft, No tenderness,    Extremity/Skin:- 2 plus  edema,      Data Review:   Micro Results Recent Results (from the past 240 hour(s))  MRSA PCR Screening     Status: None   Collection Time: 01/23/2017  1:00 PM  Result Value Ref Range Status   MRSA by PCR NEGATIVE NEGATIVE Final    Comment:        The GeneXpert MRSA Assay (FDA approved for NASAL specimens only), is one component of a comprehensive MRSA colonization surveillance program. It is not intended to diagnose MRSA infection nor to guide or monitor treatment for MRSA infections.     Radiology Reports Dg Chest Port 1 View  Result Date: 02/18/2017 CLINICAL DATA:  Hemoptysis.  CHF. EXAM: PORTABLE CHEST 1 VIEW COMPARISON:  Chest radiograph yesterday, additional priors FINDINGS: Tip of the right upper extremity PICC in the SVC. Low lung volumes. Stable cardiomegaly and mediastinal contours. Interstitial edema is unchanged from prior exam. Hazy lung base opacities consistent with pleural fluid, stable. No significant interval change. IMPRESSION: No significant interval change in pulmonary edema,  cardiomegaly, probable bilateral pleural effusions. Radiographic findings suggest CHF. Electronically Signed   By: Rubye Oaks M.D.   On: 02/18/2017 04:51   Dg Chest Port 1 View  Result Date: 02/17/2017 CLINICAL DATA:  Shortness of Breath EXAM: PORTABLE CHEST 1 VIEW COMPARISON:  01/31/2017 FINDINGS: Cardiac shadow is enlarged. Vascular congestion and interstitial edema is noted similar to that seen on the prior exam. Right-sided PICC line is again identified. No bony abnormality is seen. IMPRESSION: Changes consistent with CHF Electronically Signed   By: Alcide Clever M.D.   On: 02/17/2017 10:44  Dg Chest Port 1 View  Result Date: 02/10/2017 CLINICAL DATA:  Right PICC placement.  Initial encounter. EXAM: PORTABLE CHEST 1 VIEW COMPARISON:  Chest radiograph performed earlier today at 9:07 a.m. FINDINGS: A right PICC is noted ending about the cavoatrial junction. Bilateral airspace opacities raise concern for pulmonary edema. Underlying small bilateral pleural effusions are noted. No pneumothorax is seen. The cardiomediastinal silhouette is enlarged. No acute osseous abnormalities are identified. IMPRESSION: 1. Right PICC noted ending about the cavoatrial junction. 2. Bilateral airspace opacities raise concern for pulmonary edema. Small bilateral pleural effusions seen. Cardiomegaly. Electronically Signed   By: Roanna Raider M.D.   On: 01/29/2017 22:55   Dg Chest Port 1 View  Result Date: 02/02/2017 CLINICAL DATA:  Sudden onset left chest pain, initial encounter EXAM: PORTABLE CHEST 1 VIEW COMPARISON:  06/07/2016 FINDINGS: Cardiac shadow is enlarged but accentuated by the portable technique. Mild interstitial changes are noted bilaterally which may represent some interstitial edema. Chronic changes are seen in the bases bilaterally stable from the prior exam. No acute bony abnormality is noted. IMPRESSION: Chronic changes in the bases bilaterally. Changes of mild interstitial edema appear  Electronically Signed   By: Alcide Clever M.D.   On: 02/09/2017 09:17     CBC  Recent Labs Lab 01/26/2017 0944 02/17/17 0600 02/18/17 0448 02/19/17 0530 02/21/17 0500  WBC 14.9* 19.0* 22.2* 20.3* 16.6*  HGB 16.2* 14.4 15.3* 14.4 14.1  HCT 47.7* 42.9 44.7 42.0 41.9  PLT 270 240 218 229 262  MCV 81.7 80.5 79.5 79.5 80.0  MCH 27.7 27.0 27.2 27.3 26.9  MCHC 34.0 33.6 34.2 34.3 33.7  RDW 15.6* 15.2 15.1 14.9 14.9  LYMPHSABS  --  1.3  --   --   --   MONOABS  --  1.8*  --   --   --   EOSABS  --  0.1  --   --   --   BASOSABS  --  0.0  --   --   --     Chemistries   Recent Labs Lab 02/17/17 0500 02/17/17 0600 02/18/17 0448 02/18/17 1653 02/19/17 0530 02/20/17 0523 02/21/17 0500 02/22/17 0435  NA  --  132* 133*  --  134* 133* 131* 133*  K  --  3.2* 2.5* 2.8* 3.0* 3.0* 3.4* 4.0  CL  --  102 96*  --  90* 86* 83* 87*  CO2  --  23 29  --  35* 39* 38* 38*  GLUCOSE  --  167* 131*  --  130* 138* 109* 134*  BUN  --  22* 20  --  20 20 23* 28*  CREATININE  --  0.94 1.10*  --  0.94 1.10* 0.97 0.95  CALCIUM  --  7.7* 8.5*  --  8.2* 8.3* 8.4* 8.8*  MG 1.6*  --  1.7  --  1.7 2.0  --   --   AST  --  94*  --   --   --   --   --   --   ALT  --  158*  --   --   --   --   --   --   ALKPHOS  --  167*  --   --   --   --   --   --   BILITOT  --  1.8*  --   --   --   --   --   --    ------------------------------------------------------------------------------------------------------------------ No  results for input(s): CHOL, HDL, LDLCALC, TRIG, CHOLHDL, LDLDIRECT in the last 72 hours.  Lab Results  Component Value Date   HGBA1C 6.2 (H) 06/28/2013   ------------------------------------------------------------------------------------------------------------------ No results for input(s): TSH, T4TOTAL, T3FREE, THYROIDAB in the last 72 hours.  Invalid input(s): FREET3 ------------------------------------------------------------------------------------------------------------------ No results  for input(s): VITAMINB12, FOLATE, FERRITIN, TIBC, IRON, RETICCTPCT in the last 72 hours.  Coagulation profile No results for input(s): INR, PROTIME in the last 168 hours.  No results for input(s): DDIMER in the last 72 hours.  Cardiac Enzymes  Recent Labs Lab 02/12/2017 1018 02/17/17 0057  TROPONINI 0.03* 0.03*   ------------------------------------------------------------------------------------------------------------------    Component Value Date/Time   BNP 759.3 (H) 02/21/2017 0930     Harjas Biggins M.D on 02/22/2017 at 7:01 PM  Between 7am to 7pm - Pager - [phone]  After 7pm go to [email or web address] - password [email or web address]  [facility] -  Office  [phone]  Dragon dictation system was used to create this note, attempts have been made to correct errors, however presence of uncorrected errors is not a reflection quality of care provided

## 2017-02-22 DEATH — deceased

## 2017-02-23 ENCOUNTER — Inpatient Hospital Stay (HOSPITAL_COMMUNITY): Payer: Medicare Other

## 2017-02-23 LAB — URINALYSIS, ROUTINE W REFLEX MICROSCOPIC
BILIRUBIN URINE: NEGATIVE
Glucose, UA: NEGATIVE mg/dL
HGB URINE DIPSTICK: NEGATIVE
Ketones, ur: NEGATIVE mg/dL
Leukocytes, UA: NEGATIVE
NITRITE: NEGATIVE
PROTEIN: NEGATIVE mg/dL
SPECIFIC GRAVITY, URINE: 1.011 (ref 1.005–1.030)
pH: 7 (ref 5.0–8.0)

## 2017-02-23 LAB — BASIC METABOLIC PANEL
Anion gap: 7 (ref 5–15)
BUN: 24 mg/dL — AB (ref 6–20)
CHLORIDE: 86 mmol/L — AB (ref 101–111)
CO2: 42 mmol/L — AB (ref 22–32)
CREATININE: 1.03 mg/dL — AB (ref 0.44–1.00)
Calcium: 9.1 mg/dL (ref 8.9–10.3)
GFR calc Af Amer: >60 mL/min (ref >60)
GFR calc non Af Amer: 55 mL/min — ABNORMAL LOW (ref >60)
Glucose, Bld: 126 mg/dL — ABNORMAL HIGH (ref 65–99)
Potassium: 3.9 mmol/L (ref 3.5–5.1)
Sodium: 135 mmol/L (ref 135–145)

## 2017-02-23 LAB — CBC
HEMATOCRIT: 42.3 % (ref 36.0–46.0)
HEMOGLOBIN: 14.3 g/dL (ref 12.0–15.0)
MCH: 27.3 pg (ref 26.0–34.0)
MCHC: 33.8 g/dL (ref 30.0–36.0)
MCV: 80.9 fL (ref 78.0–100.0)
Platelets: 305 10*3/uL (ref 150–400)
RBC: 5.23 MIL/uL — ABNORMAL HIGH (ref 3.87–5.11)
RDW: 15.2 % (ref 11.5–15.5)
WBC: 18.6 10*3/uL — ABNORMAL HIGH (ref 4.0–10.5)

## 2017-02-23 MED ORDER — DEXTROSE 5 % IV SOLN
500.0000 mg | INTRAVENOUS | Status: DC
  Administered 2017-02-23 – 2017-02-25 (×3): 500 mg via INTRAVENOUS
  Filled 2017-02-23 (×2): qty 500

## 2017-02-23 MED ORDER — ASPIRIN 81 MG PO CHEW
81.0000 mg | CHEWABLE_TABLET | ORAL | Status: AC
  Administered 2017-02-24: 81 mg via ORAL
  Filled 2017-02-23: qty 1

## 2017-02-23 MED ORDER — SODIUM CHLORIDE 0.9% FLUSH
3.0000 mL | INTRAVENOUS | Status: DC | PRN
  Administered 2017-02-26: 3 mL via INTRAVENOUS
  Filled 2017-02-23: qty 3

## 2017-02-23 MED ORDER — SODIUM CHLORIDE 0.9 % IV SOLN: 250.0000 mL | INTRAVENOUS | Status: DC | PRN

## 2017-02-23 MED ORDER — DEXTROSE 5 % IV SOLN
1.0000 g | INTRAVENOUS | Status: DC
  Administered 2017-02-23 – 2017-02-25 (×3): 1 g via INTRAVENOUS
  Filled 2017-02-23 (×3): qty 10

## 2017-02-23 MED ORDER — SODIUM CHLORIDE 0.9 % IV SOLN
INTRAVENOUS | Status: DC
  Administered 2017-02-24: 05:00:00 via INTRAVENOUS

## 2017-02-23 MED ORDER — SODIUM CHLORIDE 0.9% FLUSH
3.0000 mL | Freq: Two times a day (BID) | INTRAVENOUS | Status: DC
Start: 2017-02-23 — End: 2017-03-03
  Administered 2017-02-23 – 2017-03-03 (×13): 3 mL via INTRAVENOUS

## 2017-02-23 NOTE — Progress Notes (Signed)
Date:  February 23, 2017 Chart reviewed for concurrent status and case management needs. Will continue to follow patient progress./remains on NRB mask for 02 needs, cxr-+ for PNA and CHF. Discharge Planning: following for needs Expected discharge date: [id number] Marcelle Smiling, BSN, [place], [place]   [phone]

## 2017-02-23 NOTE — Progress Notes (Signed)
Patient Demographics:    [name], is a 66 y.o. female, DOB - Mar 07, 1951, TOI:[id number]  Admit date - 02/08/2017   Admitting Physician [name], MD  Outpatient Primary MD for the patient is OSEI-BONSU,GEORGE, MD  LOS - 7   Chief Complaint  Patient presents with  . Cough  . Chest Pain        Subjective:    [name] today has no fevers, no emesis,  No chest pain,  Has Cough, mild hemoptysis, shortness of breath, tachycardia with palpitations, had 2 BMs   Assessment  & Plan :    Principal Problem:   Acute on chronic respiratory failure with hypoxia (HCC) Active Problems:   Essential hypertension   Acute on chronic diastolic CHF (congestive heart failure) (HCC)   Leukocytosis   Schizophrenia (HCC)   CKD (chronic kidney disease) stage 3, GFR 30-59 ml/min   Dyspnea   Bronchiectasis without acute exacerbation (HCC)   PAF (paroxysmal atrial fibrillation) (HCC)   Amiodarone pulmonary toxicity   Atrial fibrillation with RVR (HCC)   Hemoptysis   LD (liver disease)   Pulmonary hypertension  Brief summary:- 65 y.o.femalewith pulmonary fibrosis (amio-related?), severe pulmonary HTN, diastolic heart failure with acute exacerbation, persistent atrial fibrillation with ongoing mild hemoptysis, HTN, HLP, mild DM, anemia and history of AVMs. Plan is for right heart catheterization on 03-10-17 after adequate diuresis   1)Leukocytosis- worse, possible left-sided pneumonia, no fevers/chills,  UA neg, CXR with CHF/pulmonary edema versus left-sided pneumonia with loculated effusion, treat empirically with IV Rocephin and azithromycin pending blood cultures. Patient remains very hypoxic requiring high flow oxygen, cough and mild hemoptysis persist  2)Acute Respiratory Failure with Hypoxia suspected to be due to [facility] -  only marginally improved, hypoxia and shortness of breath  persisted despite high-dose Lasix, cardiology input appreciated , CVP remains elevated , c/n  Lasix IV 80 twice a day, Zaroxolyn 2.5 mg by mouth daily, Rtheart cath Tentatively 2017/03/10 once adequately diureses and hopefully CVP <10 .  Unable to Wean off oxygen, patient is on very high flow oxygen at this time. History of amiodarone-induced pulmonary fibrosis  3)Afib with RVR- CHA2DS2 = 4, declinedanticoagulation previously due to anemia and blood loss, currently not a candidate for anticoagulation due to hemoptysis, off IV Cardizem, tachycardia persists, digoxin been added for better rate control, c/noral Cardizem 90 mg q 6 hrs, continue digoxin and metoprolol 5 mg q 6 hrs. Will follow with Dr. Rennis Golden post d/c  4)HFpEF-Patient with symptoms of biventricular failure, recent echocardiogram (02/17/17)with EF of 50-55%, improving acute on chronic diastolic CHF exacerbation, please see diuretic management as above in #1. Continue fluid restriction (sodium has normalized)  5) Severe PULMonary HTN-- patient with history of pulmonary fibrosis, presumably secondary to amiodarone toxicity, patient has COPD at baseline as well, Rtheart cath Tentatively 03/10/2017 once adequately diureses and CVP <10  echocardiogram from 02/17/2017 confirms severe pulmonary hypertension and moderately reducedRV function, continue bronchodilators  6)H/o Microcytic anemia- resolved anemia, follow H&H, EGD and Colonoscopy in 2014 normal (Dr Dionne Bucy stopped her PPI 4 months ago, follow H&H  7)CKD 2 to 3- monitor renal function and electrolytes closely with diuresis, replace potassium and magnesium and recheck lytes  8)HTN- stable, continue oral Cardizem and IV metoprolol  for now  DVT prophylaxis:SCDs Code Status:Full code Disposition Plan:still requiring very high flow oxygen Consultants:  Cardiology  PCCM  SCDs     DVT Prophylaxis  :SCDs   Lab Results  Component Value Date   PLT 305 02/23/2017     Inpatient Medications  Scheduled Meds: . azithromycin  500 mg Intravenous Q24H  . cefTRIAXone (ROCEPHIN)  IV  1 g Intravenous Q24H  . Chlorhexidine Gluconate Cloth  6 each Topical Daily  . digoxin  0.125 mg Oral Daily  . diltiazem  120 mg Oral Q6H  . furosemide  80 mg Intravenous BID  . magnesium oxide  400 mg Oral BID  . metolazone  2.5 mg Oral Daily  . metoprolol  5 mg Intravenous Q6H  . montelukast  10 mg Oral QHS  . nicotine  7 mg Transdermal QHS  . pantoprazole  40 mg Oral QHS  . pravastatin  20 mg Oral Daily  . sodium chloride flush  10-40 mL Intracatheter Q12H  . sodium chloride flush  3 mL Intravenous Q12H   Continuous Infusions: PRN Meds:.bisacodyl, levalbuterol, ondansetron **OR** ondansetron (ZOFRAN) IV, oxyCODONE-acetaminophen, promethazine, simethicone, sodium chloride flush    Anti-infectives    Start     Dose/Rate Route Frequency Ordered Stop   02/23/17 1645  azithromycin (ZITHROMAX) 500 mg in dextrose 5 % 250 mL IVPB     500 mg 250 mL/hr over 60 Minutes Intravenous Every 24 hours 02/23/17 1637     02/23/17 1645  cefTRIAXone (ROCEPHIN) 1 g in dextrose 5 % 50 mL IVPB     1 g 100 mL/hr over 30 Minutes Intravenous Every 24 hours 02/23/17 1637          Objective:   Vitals:   02/23/17 1028 02/23/17 1200 02/23/17 1244 02/23/17 1400  BP:  137/81 137/81 (!) 154/79  Pulse: (!) 105     Resp:  (!) 31  (!) 21  Temp:      TempSrc:      SpO2:  94%  91%  Weight:      Height:        Wt Readings from Last 3 Encounters:  02/23/17 93.3 kg (205 lb 11 oz)  11/19/16 91.6 kg (202 lb)  08/27/16 91.2 kg (201 lb)     Intake/Output Summary (Last 24 hours) at 02/23/17 1637 Last data filed at 02/23/17 1300  Gross per 24 hour  Intake              994 ml  Output             1610 ml  Net             -616 ml     Physical Exam  Gen:- Awake Alert,  Able to speak in short sentences HEENT:- [place].AT, No sclera icterus Neck-Supple Neck,No JVD,.  Lungs-  diminished in  bases with a chronic type rales  CV- S1, S2 normal, irregularly irregular and tachycardic with heart rate in the 110s Abd-  +ve B.Sounds, Abd Soft, No tenderness,    Extremity/Skin:- +ve edema neuroPsych- appropriate affect, AAO x 3     Data Review:   Micro Results Recent Results (from the past 240 hour(s))  MRSA PCR Screening     Status: None   Collection Time: 2017-03-04  1:00 PM  Result Value Ref Range Status   MRSA by PCR NEGATIVE NEGATIVE Final    Comment:        The GeneXpert MRSA Assay (FDA approved for NASAL  specimens only), is one component of a comprehensive MRSA colonization surveillance program. It is not intended to diagnose MRSA infection nor to guide or monitor treatment for MRSA infections.     Radiology Reports Dg Chest 2 View  Result Date: 02/23/2017 CLINICAL DATA:  Pulmonary fibrosis. Shortness of breath. Diastolic heart failure EXAM: CHEST  2 VIEW COMPARISON:  February 18, 2017 and June 07, 2016 FINDINGS: There is cardiomegaly with what appears to be a degree of pulmonary venous hypertension. There is a moderate pleural effusion on the left which appears partially loculated. There is underlying diffuse interstitial prominence which appears to be consistent with interstitial edema superimposed on fibrotic change. Fibrotic changes primarily located in the lower lung zones. There is airspace consolidation in the left lower lobe as well. Central catheter tip is in the superior vena cava. No pneumothorax. There is aortic atherosclerosis. No adenopathy. No bone lesions. IMPRESSION: Findings felt to represent a degree of congestive heart failure superimposed on fibrotic type change. There is loculated effusion on the left with consolidation in the left base which could represent alveolar edema or potentially superimposed pneumonia in this area. Alveolar edema is also noted in the right upper lobe, similar to recent prior study. There is aortic atherosclerosis. Central catheter  tip in superior vena cava. Current appearance is similar to most recent prior study except for a slightly deeper degree of inspiration currently in a slight increase in effusion on the left. Electronically Signed   By: Bretta Bang III M.D.   On: 02/23/2017 08:36   Dg Chest Port 1 View  Result Date: 02/18/2017 CLINICAL DATA:  Hemoptysis.  CHF. EXAM: PORTABLE CHEST 1 VIEW COMPARISON:  Chest radiograph yesterday, additional priors FINDINGS: Tip of the right upper extremity PICC in the SVC. Low lung volumes. Stable cardiomegaly and mediastinal contours. Interstitial edema is unchanged from prior exam. Hazy lung base opacities consistent with pleural fluid, stable. No significant interval change. IMPRESSION: No significant interval change in pulmonary edema, cardiomegaly, probable bilateral pleural effusions. Radiographic findings suggest CHF. Electronically Signed   By: Rubye Oaks M.D.   On: 02/18/2017 04:51   Dg Chest Port 1 View  Result Date: 02/17/2017 CLINICAL DATA:  Shortness of Breath EXAM: PORTABLE CHEST 1 VIEW COMPARISON:  01/29/2017 FINDINGS: Cardiac shadow is enlarged. Vascular congestion and interstitial edema is noted similar to that seen on the prior exam. Right-sided PICC line is again identified. No bony abnormality is seen. IMPRESSION: Changes consistent with CHF Electronically Signed   By: Alcide Clever M.D.   On: 02/17/2017 10:44   Dg Chest Port 1 View  Result Date: 02/05/2017 CLINICAL DATA:  Right PICC placement.  Initial encounter. EXAM: PORTABLE CHEST 1 VIEW COMPARISON:  Chest radiograph performed earlier today at 9:07 a.m. FINDINGS: A right PICC is noted ending about the cavoatrial junction. Bilateral airspace opacities raise concern for pulmonary edema. Underlying small bilateral pleural effusions are noted. No pneumothorax is seen. The cardiomediastinal silhouette is enlarged. No acute osseous abnormalities are identified. IMPRESSION: 1. Right PICC noted ending about the  cavoatrial junction. 2. Bilateral airspace opacities raise concern for pulmonary edema. Small bilateral pleural effusions seen. Cardiomegaly. Electronically Signed   By: Roanna Raider M.D.   On: 01/22/2017 22:55   Dg Chest Port 1 View  Result Date: 02/20/2017 CLINICAL DATA:  Sudden onset left chest pain, initial encounter EXAM: PORTABLE CHEST 1 VIEW COMPARISON:  06/07/2016 FINDINGS: Cardiac shadow is enlarged but accentuated by the portable technique. Mild interstitial changes are noted bilaterally  which may represent some interstitial edema. Chronic changes are seen in the bases bilaterally stable from the prior exam. No acute bony abnormality is noted. IMPRESSION: Chronic changes in the bases bilaterally. Changes of mild interstitial edema appear Electronically Signed   By: Alcide Clever M.D.   On: 2017-03-10 09:17     CBC  Recent Labs Lab 02/17/17 0600 02/18/17 0448 02/19/17 0530 02/21/17 0500 02/23/17 0545  WBC 19.0* 22.2* 20.3* 16.6* 18.6*  HGB 14.4 15.3* 14.4 14.1 14.3  HCT 42.9 44.7 42.0 41.9 42.3  PLT 240 218 229 262 305  MCV 80.5 79.5 79.5 80.0 80.9  MCH 27.0 27.2 27.3 26.9 27.3  MCHC 33.6 34.2 34.3 33.7 33.8  RDW 15.2 15.1 14.9 14.9 15.2  LYMPHSABS 1.3  --   --   --   --   MONOABS 1.8*  --   --   --   --   EOSABS 0.1  --   --   --   --   BASOSABS 0.0  --   --   --   --     Chemistries   Recent Labs Lab 02/17/17 0500  02/17/17 0600 02/18/17 0448  02/19/17 0530 02/20/17 0523 02/21/17 0500 02/22/17 0435 02/23/17 0545  NA  --   < > 132* 133*  --  134* 133* 131* 133* 135  K  --   < > 3.2* 2.5*  < > 3.0* 3.0* 3.4* 4.0 3.9  CL  --   < > 102 96*  --  90* 86* 83* 87* 86*  CO2  --   < > 23 29  --  35* 39* 38* 38* 42*  GLUCOSE  --   < > 167* 131*  --  130* 138* 109* 134* 126*  BUN  --   < > 22* 20  --  20 20 23* 28* 24*  CREATININE  --   < > 0.94 1.10*  --  0.94 1.10* 0.97 0.95 1.03*  CALCIUM  --   < > 7.7* 8.5*  --  8.2* 8.3* 8.4* 8.8* 9.1  MG 1.6*  --   --  1.7  --   1.7 2.0  --   --   --   AST  --   --  94*  --   --   --   --   --   --   --   ALT  --   --  158*  --   --   --   --   --   --   --   ALKPHOS  --   --  167*  --   --   --   --   --   --   --   BILITOT  --   --  1.8*  --   --   --   --   --   --   --   < > = values in this interval not displayed. ------------------------------------------------------------------------------------------------------------------ No results for input(s): CHOL, HDL, LDLCALC, TRIG, CHOLHDL, LDLDIRECT in the last 72 hours.  Lab Results  Component Value Date   HGBA1C 6.2 (H) 06/28/2013   ------------------------------------------------------------------------------------------------------------------ No results for input(s): TSH, T4TOTAL, T3FREE, THYROIDAB in the last 72 hours.  Invalid input(s): FREET3 ------------------------------------------------------------------------------------------------------------------ No results for input(s): VITAMINB12, FOLATE, FERRITIN, TIBC, IRON, RETICCTPCT in the last 72 hours.  Coagulation profile No results for input(s): INR, PROTIME in the last 168 hours.  No results for input(s): DDIMER in the  last 72 hours.  Cardiac Enzymes  Recent Labs Lab 02/17/17 0057  TROPONINI 0.03*   ------------------------------------------------------------------------------------------------------------------    Component Value Date/Time   BNP 759.3 (H) 02/21/2017 0930     Chelle Cayton M.D on 02/23/2017 at 4:37 PM  Between 7am to 7pm - Pager - [phone]  After 7pm go to [email or web address] - password [email or web address]  [facility] -  Office  [phone]  Dragon dictation system was used to create this note, attempts have been made to correct errors, however presence of uncorrected errors is not a reflection quality of care provided

## 2017-02-23 NOTE — Progress Notes (Addendum)
Progress Note  Patient Name: [name] Date of Encounter: 02/23/2017  Primary Cardiologist: Hilty  Subjective   Breathing better. Still mildly orthopneic and tachypneic, but able to speak without interruption. Still on NRB mask.  CVP 20 this morning prior to am lasix. SCr mildly elevated at 1.03 (0.95 yesterday)  Inpatient Medications    Scheduled Meds: . Chlorhexidine Gluconate Cloth  6 each Topical Daily  . digoxin  0.125 mg Oral Daily  . diltiazem  120 mg Oral Q6H  . furosemide  80 mg Intravenous BID  . magnesium oxide  400 mg Oral BID  . metolazone  2.5 mg Oral Daily  . metoprolol  5 mg Intravenous Q6H  . montelukast  10 mg Oral QHS  . nicotine  7 mg Transdermal QHS  . pantoprazole  40 mg Oral QHS  . pravastatin  20 mg Oral Daily  . sodium chloride flush  10-40 mL Intracatheter Q12H  . sodium chloride flush  3 mL Intravenous Q12H   Continuous Infusions:  PRN Meds: bisacodyl, levalbuterol, ondansetron **OR** ondansetron (ZOFRAN) IV, oxyCODONE-acetaminophen, promethazine, simethicone, sodium chloride flush   Vital Signs    Vitals:   02/23/17 0318 02/23/17 0400 02/23/17 0500 02/23/17 0600  BP:  134/70  135/65  Pulse:      Resp:   18 (!) 25  Temp: 98.4 F (36.9 C)     TempSrc: Oral     SpO2:  95% 94% 91%  Weight:   205 lb 11 oz (93.3 kg)   Height:        Intake/Output Summary (Last 24 hours) at 02/23/17 0747 Last data filed at 02/23/17 0600  Gross per 24 hour  Intake             1147 ml  Output             1310 ml  Net             -163 ml   Filed Weights   02/21/17 0500 02/22/17 0400 02/23/17 0500  Weight: 208 lb 15.9 oz (94.8 kg) 205 lb 11 oz (93.3 kg) 205 lb 11 oz (93.3 kg)    Telemetry    AF with rates in the 60's-70's - Personally Reviewed  Physical Exam  Smiling, tachypneic GEN: No acute distress.   Neck: 8-10 cm JVD Cardiac: irrregular, no murmurs, rubs, or gallops.  Respiratory: fewer rales, mostly clear. GI: Soft, nontender,  non-distended  MS: No edema; No deformity. Neuro:  Nonfocal  Psych: Normal affect   Labs    Chemistry Recent Labs Lab 02/17/17 0600  02/21/17 0500 02/22/17 0435 02/23/17 0545  NA 132*  < > 131* 133* 135  K 3.2*  < > 3.4* 4.0 3.9  CL 102  < > 83* 87* 86*  CO2 23  < > 38* 38* 42*  GLUCOSE 167*  < > 109* 134* 126*  BUN 22*  < > 23* 28* 24*  CREATININE 0.94  < > 0.97 0.95 1.03*  CALCIUM 7.7*  < > 8.4* 8.8* 9.1  PROT 6.6  --   --   --   --   ALBUMIN 2.8*  --   --   --   --   AST 94*  --   --   --   --   ALT 158*  --   --   --   --   ALKPHOS 167*  --   --   --   --   BILITOT  1.8*  --   --   --   --   GFRNONAA >60  < > 60* >60 55*  GFRAA >60  < > >60 >60 >60  ANIONGAP 7  < > 10 8 7   < > = values in this interval not displayed.   Hematology  Recent Labs Lab 02/19/17 0530 02/21/17 0500 02/23/17 0545  WBC 20.3* 16.6* 18.6*  RBC 5.28* 5.24* 5.23*  HGB 14.4 14.1 14.3  HCT 42.0 41.9 42.3  MCV 79.5 80.0 80.9  MCH 27.3 26.9 27.3  MCHC 34.3 33.7 33.8  RDW 14.9 14.9 15.2  PLT 229 262 305    Cardiac Enzymes  Recent Labs Lab 02/28/17 1018 02/17/17 0057  TROPONINI 0.03* 0.03*   No results for input(s): TROPIPOC in the last 168 hours.   BNP  Recent Labs Lab 02-28-2017 1108 02/21/17 0930  BNP 501.5* 759.3*     Cardiac Studies   ECHO 02/17/17  - Left ventricle: The cavity size was normal. Wall thickness was normal. Systolic function was normal. The estimated ejection fraction was in the range of 50% to 55%. Wall motion was normal; there were no regional wall motion abnormalities. - Ventricular septum: The contour showed diastolic flattening and systolic flattening. - Left atrium: The atrium was moderately dilated. - Right ventricle: The cavity size was mildly dilated. Systolic function was moderately reduced. - Right atrium: The atrium was moderately dilated. - Tricuspid valve: There was moderate regurgitation. - Pulmonary arteries: Systolic  pressure was severely increased. PA peak pressure: 70 mm Hg (S). - Pericardium, extracardiac: A small pericardial effusion was identified.  Impressions:  - Technically difficult; normal LV systolic function; D shaped septum suggestive of pulmonary hypertension; biatrial enlargement; mild RVE with moderately reduced RV function; moderate TR with severely elevated pulmonary pressure; small pericardial effusion.  Patient Profile     66 y.o. female with pulmonary fibrosis (amio-related?), severe pulmonary HTN, diastolic heart failure with acute exacerbation, persistent atrial fibrillation with ongoing mild hemoptysis, HTN, HLP, mild DM, anemia and history of AVMs  Assessment & Plan    1. CHF (HFpEF)  -acute exacerbation with biventricular manifestations. -Improved UO and Co-ox, but increase in BNP. Planning right heart cath once diuresed and stable. -Currently on IV lasix 80 mg bid and metolazone 2.5 mg daily -CVP still high: 16-18 (20 this morning prior to lasix). -Still requiring oxygen -100% NRB  -I&O negative 163 with 1310 ml UOP over 24 h. Net negative 8L since admission. Wt is unchanged since yesterday, down >5 pounds from max.  -Continue another 24-48h on current higher diuretic dose, before we proceed to heart cath. On fluid restriction for hyponatremia.  2. PAH  -severe, difficult to know to what degree it is due to pulmonary fibrosis versus left heart failure. Will need a heart cath after diuresis. We may never get her CVP down, as she appears to have substantial RHF.  3. Afib  -rate is now well controlled in the 60's-70's on increased diltiazem (now 120 mg q6h). Also on metoprolol 5 mg IV q 6h -Unable to anticoagulate with ongoing hemoptysis (and hx of anemia due to GI bleeding).  4. Acute respiratory failure/hypoxia -due to CHF and pulmonary fibrosis. Improving slowly. -Still requiring 100% NRB  5. Hypokalemia  -resolved. Normal Mg.  6. Pulmonary  fibrosis  -presumed to be amio related  7. Elevated WBC  -Max 22.2, down to 16.6, 18.6 today.  Signed, 70, NP  02/23/2017, 7:47 AM    Personally seen and  examined. Agree with above.  Now sitting up in chair on [place] O2. Improved this AM. Lungs with velco like crackles, Irreg irreg mildly tachy, 1+ edema B Plan was to RHC. 12 noon. NPO.  I think we should try for tomorrow. RHC. Continue diuresis Out 8.3L Not on anticoag, prior GI bleed. Refuses  Donato Schultz, MD

## 2017-02-24 ENCOUNTER — Encounter (HOSPITAL_COMMUNITY): Admission: EM | Disposition: E | Payer: Self-pay | Source: Home / Self Care | Attending: Emergency Medicine

## 2017-02-24 ENCOUNTER — Inpatient Hospital Stay (HOSPITAL_COMMUNITY): Payer: Medicare Other

## 2017-02-24 LAB — BASIC METABOLIC PANEL
Anion gap: 8 (ref 5–15)
BUN: 28 mg/dL — ABNORMAL HIGH (ref 6–20)
CALCIUM: 8.3 mg/dL — AB (ref 8.9–10.3)
CO2: 41 mmol/L — AB (ref 22–32)
CREATININE: 0.97 mg/dL (ref 0.44–1.00)
Chloride: 82 mmol/L — ABNORMAL LOW (ref 101–111)
GFR calc non Af Amer: 60 mL/min — ABNORMAL LOW (ref >60)
Glucose, Bld: 109 mg/dL — ABNORMAL HIGH (ref 65–99)
Potassium: 4.2 mmol/L (ref 3.5–5.1)
Sodium: 131 mmol/L — ABNORMAL LOW (ref 135–145)

## 2017-02-24 LAB — CBC
HEMATOCRIT: 39.5 % (ref 36.0–46.0)
Hemoglobin: 13.5 g/dL (ref 12.0–15.0)
MCH: 26.8 pg (ref 26.0–34.0)
MCHC: 34.2 g/dL (ref 30.0–36.0)
MCV: 78.5 fL (ref 78.0–100.0)
Platelets: 448 10*3/uL — ABNORMAL HIGH (ref 150–400)
RBC: 5.03 MIL/uL (ref 3.87–5.11)
RDW: 15 % (ref 11.5–15.5)
WBC: 19.9 10*3/uL — AB (ref 4.0–10.5)

## 2017-02-24 LAB — PROTIME-INR
INR: 1.15
Prothrombin Time: 14.8 seconds (ref 11.4–15.2)

## 2017-02-24 SURGERY — RIGHT HEART CATH
Anesthesia: LOCAL

## 2017-02-24 MED ORDER — FUROSEMIDE 10 MG/ML IJ SOLN
80.0000 mg | Freq: Three times a day (TID) | INTRAMUSCULAR | Status: DC
  Filled 2017-02-24: qty 8

## 2017-02-24 MED ORDER — FUROSEMIDE 10 MG/ML IJ SOLN
80.0000 mg | Freq: Three times a day (TID) | INTRAMUSCULAR | Status: DC
  Administered 2017-02-24 – 2017-02-25 (×5): 80 mg via INTRAVENOUS
  Filled 2017-02-24 (×4): qty 8

## 2017-02-24 MED ORDER — FUROSEMIDE 10 MG/ML IJ SOLN
80.0000 mg | Freq: Three times a day (TID) | INTRAMUSCULAR | Status: DC
  Administered 2017-02-24: 80 mg via INTRAVENOUS

## 2017-02-24 MED ORDER — METOLAZONE 5 MG PO TABS
5.0000 mg | ORAL_TABLET | Freq: Every day | ORAL | Status: DC
  Administered 2017-02-24 (×2): 5 mg via ORAL
  Filled 2017-02-24 (×2): qty 1

## 2017-02-24 NOTE — Progress Notes (Signed)
Progress Note  Patient Name: [name] Date of Encounter: 03-04-2017  Primary Cardiologist: Hilty  Subjective   Breathing slowly progressing. Still mildly tachypneic on non-rebreather/high flow nasal cannula. She is diuresing a little better. We are canceling right heart cath for today with push for increased diuresis. Explained to patient. She is disappointed that cath is postponed but understands.   Inpatient Medications    Scheduled Meds: . azithromycin  500 mg Intravenous Q24H  . cefTRIAXone (ROCEPHIN)  IV  1 g Intravenous Q24H  . Chlorhexidine Gluconate Cloth  6 each Topical Daily  . digoxin  0.125 mg Oral Daily  . diltiazem  120 mg Oral Q6H  . furosemide  80 mg Intravenous BID  . magnesium oxide  400 mg Oral BID  . metolazone  2.5 mg Oral Daily  . metoprolol  5 mg Intravenous Q6H  . montelukast  10 mg Oral QHS  . nicotine  7 mg Transdermal QHS  . pantoprazole  40 mg Oral QHS  . pravastatin  20 mg Oral Daily  . sodium chloride flush  10-40 mL Intracatheter Q12H  . sodium chloride flush  3 mL Intravenous Q12H  . sodium chloride flush  3 mL Intravenous Q12H   Continuous Infusions: . sodium chloride 10 mL/hr at Mar 04, 2017 0524   PRN Meds: sodium chloride, bisacodyl, levalbuterol, ondansetron **OR** ondansetron (ZOFRAN) IV, oxyCODONE-acetaminophen, promethazine, simethicone, sodium chloride flush, sodium chloride flush   Vital Signs    Vitals:   03/04/2017 0411 March 04, 2017 0438 2017/03/04 0440 2017-03-04 0651  BP:   (!) 148/76 140/75  Pulse:      Resp:   (!) 24 19  Temp: 98.3 F (36.8 C)     TempSrc: Oral     SpO2:   (!) 89% 91%  Weight:  204 lb 9.4 oz (92.8 kg)    Height:        Intake/Output Summary (Last 24 hours) at 2017-03-04 0742 Last data filed at March 04, 2017 0700  Gross per 24 hour  Intake             1202 ml  Output             2401 ml  Net            -1199 ml   Filed Weights   02/22/17 0400 02/23/17 0500 2017-03-04 0438  Weight: 205 lb 11 oz (93.3 kg)  205 lb 11 oz (93.3 kg) 204 lb 9.4 oz (92.8 kg)    Telemetry    AF with rates in the 80's-90's - Personally Reviewed  Physical Exam    GEN: No acute distress.   Neck: 3 cm JVD Cardiac: irrregular, no murmurs, rubs, or gallops.  Respiratory: Clear except for velcro like crackles, tachypneic GI: Soft, nontender, non-distended  MS: Trace-1+ lower extremity edema; No deformity. Neuro:  Nonfocal  Psych: Normal affect   Labs    Chemistry  Recent Labs Lab 02/22/17 0435 02/23/17 0545 March 04, 2017 0515  NA 133* 135 131*  K 4.0 3.9 4.2  CL 87* 86* 82*  CO2 38* 42* 41*  GLUCOSE 134* 126* 109*  BUN 28* 24* 28*  CREATININE 0.95 1.03* 0.97  CALCIUM 8.8* 9.1 8.3*  GFRNONAA >60 55* 60*  GFRAA >60 >60 >60  ANIONGAP 8 7 8      Hematology  Recent Labs Lab 02/21/17 0500 02/23/17 0545 March 04, 2017 0445  WBC 16.6* 18.6* 19.9*  RBC 5.24* 5.23* 5.03  HGB 14.1 14.3 13.5  HCT 41.9 42.3 39.5  MCV  80.0 80.9 78.5  MCH 26.9 27.3 26.8  MCHC 33.7 33.8 34.2  RDW 14.9 15.2 15.0  PLT 262 305 448*    Cardiac Enzymes No results for input(s): TROPONINI in the last 168 hours. No results for input(s): TROPIPOC in the last 168 hours.   BNP  Recent Labs Lab 02/21/17 0930  BNP 759.3*     Cardiac Studies   ECHO 02/17/17  - Left ventricle: The cavity size was normal. Wall thickness was normal. Systolic function was normal. The estimated ejection fraction was in the range of 50% to 55%. Wall motion was normal; there were no regional wall motion abnormalities. - Ventricular septum: The contour showed diastolic flattening and systolic flattening. - Left atrium: The atrium was moderately dilated. - Right ventricle: The cavity size was mildly dilated. Systolic function was moderately reduced. - Right atrium: The atrium was moderately dilated. - Tricuspid valve: There was moderate regurgitation. - Pulmonary arteries: Systolic pressure was severely increased. PA peak pressure: 70  mm Hg (S). - Pericardium, extracardiac: A small pericardial effusion was identified.  Impressions:  - Technically difficult; normal LV systolic function; D shaped septum suggestive of pulmonary hypertension; biatrial enlargement; mild RVE with moderately reduced RV function; moderate TR with severely elevated pulmonary pressure; small pericardial effusion.  Patient Profile     66 y.o. female with pulmonary fibrosis (amio-related?), severe pulmonary HTN, diastolic heart failure with acute exacerbation, persistent atrial fibrillation with ongoing mild hemoptysis, HTN, HLP, mild DM, anemia and history of AVMs  Assessment & Plan    1. CHF (HFpEF)  -acute exacerbation with biventricular manifestations. -CVP still high but improved at 14-15  -Still requiring oxygen - high flow nasal cannula -I&O negative 1.2 L in last 24h.  Net negative 9.3 L since admission. Wt down 1 pound overnight, down 6 pounds from max.  -On fluid restriction for hyponatremia. -Cancel right heart cath for today and work on diuresis as still tachypneic and requiring high flow oxygen. Will revisit  R&L heart cath after further diuresis. -Will increase lasix to 80 mg TID and metolazone 5 mg daily to push diuresis. SCr stable 0.97. K+ 4.2  2. PAH  -severe, difficult to know to what degree it is due to pulmonary fibrosis versus left heart failure. Will need a heart cath after diuresis. We may never get her CVP down, as she appears to have substantial RHF.  3. Afib  -rate is now well controlled in the 80's-90's on increased diltiazem (now 120 mg q6h). Also on metoprolol 5 mg IV q 6h. BP stable. -CHA2DS2/VAS Stroke Risk Score 4 (HTN, CHF, age, female) Unable to anticoagulate with ongoing hemoptysis (and hx of anemia due to GI bleeding and pt refuses).  4. Acute respiratory failure/hypoxia -due to CHF and pulmonary fibrosis. Improving slowly. -Still requiring high flow oxygen  5. Hypokalemia  -resolved.  Normal Mg.  6. Pulmonary fibrosis  -presumed to be amio related  7. Leukocytosis  -Max 22.2, down to 16.6, 19.9 today. -Followed by IM. Possible PNA. Treating empirically with Rocephin and azithromycin pending blood cultures.   Signed, Berton Bon, NP  03/12/2017, 7:42 AM    Personally seen and examined. Agree with above. Hypoxic RF with right heart failure  - Unable to transport secondary to O2 needs.  - Regardless, will continue with aggressive diuresis - increased lasix to 80 TID, increased metolazone.   - It would be nice to understand PCWP but regardless keep diuresing.   - I think her pulm HTN is indicative  of underlying pulm fibrosis and likely driving this process mostly.   - EF is normal. LV filling pressures look reasonable on echo.   - CXR - personally viewed - fibrosis with possible edema. Keep diuresing.   Lungs with pulm fibrosis like crackles, Severe JVP, 1+ LE edema.   Donato Schultz, MD

## 2017-02-24 NOTE — Progress Notes (Signed)
Patient placed on NRB per [facility] request prior to transport to [facility] for heart cath. Patient sats 96-97% on NRB. [facility] to return call in approximately 25 minutes to ensure patient oxygen level is still stable enough for transport. RT will continue to monitor patient.

## 2017-02-24 NOTE — Progress Notes (Addendum)
PROGRESS NOTE  [name] XYO:[id number] DOB: Mar 30, 1951 DOA: 02/20/17 PCP: [name], MD   LOS: 8 days    Brief Narrative: 66 y.o.femalewith pulmonary fibrosis (amio-related?), severe pulmonary HTN, diastolic heart failure with acute exacerbation, persistent atrial fibrillation with ongoing mild hemoptysis, HTN, HLP, mild DM, anemia and history of AVMs. Plan is for right heart catheterization after adequate diuresis  Assessment & Plan: Principal Problem:   Acute on chronic respiratory failure with hypoxia (HCC) Active Problems:   Essential hypertension   Acute on chronic diastolic CHF (congestive heart failure) (HCC)   Leukocytosis   Schizophrenia (HCC)   CKD (chronic kidney disease) stage 3, GFR 30-59 ml/min   Dyspnea   Bronchiectasis without acute exacerbation (HCC)   PAF (paroxysmal atrial fibrillation) (HCC)   Amiodarone pulmonary toxicity   Atrial fibrillation with RVR (HCC)   Hemoptysis   LD (liver disease)   Pulmonary hypertension   Leukocytosis, concern for pneumonia - patient with increased white count in the last few days, started on antibiotics empirically on 4/2 for concern for pneumonia on chest x-ray, CHF/pulmonary edema versus left-sided pneumonia with loculated effusion.  -started empirically with Ceftriaxone and Azithromycin 4/2.  Continue -Obtain a CT scan of the chest to further evaluate underlying lung issues  Acute Respiratory Failure with Hypoxia suspected to be due to acute on chronic dCHF, right-sided failure -She continues to remain very hypoxic requiring high flow oxygen, cough and mild hemoptysis persist -Appreciate cardiology input -Cath needed to be canceled today due to significant hypoxia -She continues to be diuresed, Lasix increased to 80 mg 3 times daily today by cardiology -She is net -9.3 L, weight is 204 from 210 on admission  Amiodarone-induced pulmonary fibrosis -CT scan today to reevaluate  Afib with  RVR -CHA2DS2 = 4  -declinedanticoagulation previously due to anemia and blood loss, currently not a candidate for anticoagulation due to hemoptysis,  -cardiology following  [facility] dCHF -Patient with symptoms of biventricular failure,recent echocardiogram (02/17/17)with EF of 50-55%, improving acute on chronic diastolic CHF exacerbation, please see diuretic management as above Continue fluid restriction (sodium has normalized)  Severe Pulmonary HTN -patient with history of pulmonary fibrosis, presumablysecondary to amiodarone toxicity, patient has COPD at baseline as well, Rtheart cath once adequately diureses and CVP <10 echocardiogram from 02/17/2017 confirms severe pulmonary hypertension and moderately reducedRV function, continue bronchodilators  Microcytic anemia -resolved anemia, follow H&H, EGD and Colonoscopy in 2014 normal (Dr Dionne Bucy stopped her PPI 4 months ago, follow H&H  CKD 2 to 3 -monitor renal function and electrolytes closely with diuresis, replace potassium and magnesium and recheck lytes  HTN -stable, continue oral Cardizem and IV metoprolol for now   DVT prophylaxis: SCD Code Status: Full code Family Communication: no family bedside Disposition Plan: remain in SDU  Consultants:   Cardiology   Procedures:   2D echo Impressions: - Technically difficult; normal LV systolic function; D shaped septum suggestive of pulmonary hypertension; biatrial enlargement; mild RVE with moderately reduced RV function; moderate TR with severely elevated pulmonary pressure; small pericardial effusion.  Antimicrobials:  Ceftriaxone 4/2 >>  Azithromycin 4/2 >>  Subjective: -Still feeling short of breath with minimal activity, no chest pain, no palpitations, no nausea or vomiting  Objective: Vitals:   03/06/2017 0440 03/23/2017 0651 02/22/2017 0800 03/23/2017 1000  BP: (!) 148/76 140/75 138/83 139/62  Pulse:      Resp: (!) 24 19 (!) 25 (!) 24  Temp:   98.6 F (37  C)   TempSrc:   Oral  SpO2: (!) 89% 91% 93% 90%  Weight:      Height:        Intake/Output Summary (Last 24 hours) at 03/13/2017 1056 Last data filed at 03/17/2017 1019  Gross per 24 hour  Intake             1042 ml  Output             2401 ml  Net            -1359 ml   Filed Weights   02/22/17 0400 02/23/17 0500 03/10/2017 0438  Weight: 93.3 kg (205 lb 11 oz) 93.3 kg (205 lb 11 oz) 92.8 kg (204 lb 9.4 oz)    Examination: Constitutional: NAD Vitals:   03/02/2017 0440 02/23/2017 0651 03/12/2017 0800 03/04/2017 1000  BP: (!) 148/76 140/75 138/83 139/62  Pulse:      Resp: (!) 24 19 (!) 25 (!) 24  Temp:   98.6 F (37 C)   TempSrc:   Oral   SpO2: (!) 89% 91% 93% 90%  Weight:      Height:       Eyes: PERRL, lids and conjunctivae normal ENMT: Mucous membranes are moist. No oropharyngeal exudates Neck: normal, supple, no masses, no thyromegaly Respiratory: Coarse breath sounds bilaterally, Velcro type sounds present, no wheezing Cardiovascular: irregular, 1-2+ LE edema. 2+ pedal pulses. Abdomen: no tenderness. Bowel sounds positive.  Musculoskeletal: no clubbing / cyanosis. No joint deformity upper and lower extremities. No contractures. Normal muscle tone.  Skin: no rashes, lesions, ulcers. No induration Neurologic: CN 2-12 grossly intact. Strength 5/5 in all 4.  Psychiatric: Normal judgment and insight. Alert and oriented x 3. Normal mood.    Data Reviewed: I have personally reviewed following labs and imaging studies  CBC:  Recent Labs Lab 02/18/17 0448 02/19/17 0530 02/21/17 0500 02/23/17 0545 03/08/2017 0445  WBC 22.2* 20.3* 16.6* 18.6* 19.9*  HGB 15.3* 14.4 14.1 14.3 13.5  HCT 44.7 42.0 41.9 42.3 39.5  MCV 79.5 79.5 80.0 80.9 78.5  PLT 218 229 262 305 448*   Basic Metabolic Panel:  Recent Labs Lab 02/18/17 0448  02/19/17 0530 02/20/17 0523 02/21/17 0500 02/22/17 0435 02/23/17 0545 03/16/2017 0515  NA 133*  --  134* 133* 131* 133* 135 131*  K 2.5*  < > 3.0* 3.0*  3.4* 4.0 3.9 4.2  CL 96*  --  90* 86* 83* 87* 86* 82*  CO2 29  --  35* 39* 38* 38* 42* 41*  GLUCOSE 131*  --  130* 138* 109* 134* 126* 109*  BUN 20  --  20 20 23* 28* 24* 28*  CREATININE 1.10*  --  0.94 1.10* 0.97 0.95 1.03* 0.97  CALCIUM 8.5*  --  8.2* 8.3* 8.4* 8.8* 9.1 8.3*  MG 1.7  --  1.7 2.0  --   --   --   --   < > = values in this interval not displayed. GFR: Estimated Creatinine Clearance: 65.5 mL/min (by C-G formula based on SCr of 0.97 mg/dL). Liver Function Tests: No results for input(s): AST, ALT, ALKPHOS, BILITOT, PROT, ALBUMIN in the last 168 hours. No results for input(s): LIPASE, AMYLASE in the last 168 hours. No results for input(s): AMMONIA in the last 168 hours. Coagulation Profile:  Recent Labs Lab 03/15/2017 0445  INR 1.15   Cardiac Enzymes: No results for input(s): CKTOTAL, CKMB, CKMBINDEX, TROPONINI in the last 168 hours. BNP (last 3 results)  Recent Labs  02/28/16 1145  PROBNP 837.0*  HbA1C: No results for input(s): HGBA1C in the last 72 hours. CBG: No results for input(s): GLUCAP in the last 168 hours. Lipid Profile: No results for input(s): CHOL, HDL, LDLCALC, TRIG, CHOLHDL, LDLDIRECT in the last 72 hours. Thyroid Function Tests: No results for input(s): TSH, T4TOTAL, FREET4, T3FREE, THYROIDAB in the last 72 hours. Anemia Panel: No results for input(s): VITAMINB12, FOLATE, FERRITIN, TIBC, IRON, RETICCTPCT in the last 72 hours. Urine analysis:    Component Value Date/Time   COLORURINE YELLOW 02/23/2017 0929   APPEARANCEUR CLEAR 02/23/2017 0929   LABSPEC 1.011 02/23/2017 0929   PHURINE 7.0 02/23/2017 0929   GLUCOSEU NEGATIVE 02/23/2017 0929   HGBUR NEGATIVE 02/23/2017 0929   BILIRUBINUR NEGATIVE 02/23/2017 0929   KETONESUR NEGATIVE 02/23/2017 0929   PROTEINUR NEGATIVE 02/23/2017 0929   UROBILINOGEN 0.2 10/07/2015 1055   NITRITE NEGATIVE 02/23/2017 0929   LEUKOCYTESUR NEGATIVE 02/23/2017 0929   Sepsis Labs: Invalid input(s):  PROCALCITONIN, LACTICIDVEN  Recent Results (from the past 240 hour(s))  MRSA PCR Screening     Status: None   Collection Time: February 25, 2017  1:00 PM  Result Value Ref Range Status   MRSA by PCR NEGATIVE NEGATIVE Final    Comment:        The GeneXpert MRSA Assay (FDA approved for NASAL specimens only), is one component of a comprehensive MRSA colonization surveillance program. It is not intended to diagnose MRSA infection nor to guide or monitor treatment for MRSA infections.       Radiology Studies: Dg Chest 2 View  Result Date: 02/23/2017 CLINICAL DATA:  Pulmonary fibrosis. Shortness of breath. Diastolic heart failure EXAM: CHEST  2 VIEW COMPARISON:  February 18, 2017 and June 07, 2016 FINDINGS: There is cardiomegaly with what appears to be a degree of pulmonary venous hypertension. There is a moderate pleural effusion on the left which appears partially loculated. There is underlying diffuse interstitial prominence which appears to be consistent with interstitial edema superimposed on fibrotic change. Fibrotic changes primarily located in the lower lung zones. There is airspace consolidation in the left lower lobe as well. Central catheter tip is in the superior vena cava. No pneumothorax. There is aortic atherosclerosis. No adenopathy. No bone lesions. IMPRESSION: Findings felt to represent a degree of congestive heart failure superimposed on fibrotic type change. There is loculated effusion on the left with consolidation in the left base which could represent alveolar edema or potentially superimposed pneumonia in this area. Alveolar edema is also noted in the right upper lobe, similar to recent prior study. There is aortic atherosclerosis. Central catheter tip in superior vena cava. Current appearance is similar to most recent prior study except for a slightly deeper degree of inspiration currently in a slight increase in effusion on the left. Electronically Signed   By: Bretta Bang III  M.D.   On: 02/23/2017 08:36     Scheduled Meds: . azithromycin  500 mg Intravenous Q24H  . cefTRIAXone (ROCEPHIN)  IV  1 g Intravenous Q24H  . Chlorhexidine Gluconate Cloth  6 each Topical Daily  . digoxin  0.125 mg Oral Daily  . diltiazem  120 mg Oral Q6H  . furosemide  80 mg Intravenous Q8H  . magnesium oxide  400 mg Oral BID  . metolazone  5 mg Oral Daily  . metoprolol  5 mg Intravenous Q6H  . montelukast  10 mg Oral QHS  . nicotine  7 mg Transdermal QHS  . pantoprazole  40 mg Oral QHS  . pravastatin  20 mg  Oral Daily  . sodium chloride flush  10-40 mL Intracatheter Q12H  . sodium chloride flush  3 mL Intravenous Q12H  . sodium chloride flush  3 mL Intravenous Q12H   Continuous Infusions: . sodium chloride 10 mL/hr at Mar 13, 2017 0524   Pamella Pert, MD, PhD [facility] Pager [phone]  If 7PM-7AM, please contact night-coverage [email or web address] Password [email or web address] 13-Mar-2017, 10:56 AM

## 2017-02-25 LAB — COMPREHENSIVE METABOLIC PANEL
ALT: 98 U/L — ABNORMAL HIGH (ref 14–54)
AST: 76 U/L — ABNORMAL HIGH (ref 15–41)
Albumin: 2.2 g/dL — ABNORMAL LOW (ref 3.5–5.0)
Alkaline Phosphatase: 196 U/L — ABNORMAL HIGH (ref 38–126)
Anion gap: 10 (ref 5–15)
BILIRUBIN TOTAL: 0.9 mg/dL (ref 0.3–1.2)
BUN: 30 mg/dL — AB (ref 6–20)
CO2: 43 mmol/L — ABNORMAL HIGH (ref 22–32)
CREATININE: 1.13 mg/dL — AB (ref 0.44–1.00)
Calcium: 8.8 mg/dL — ABNORMAL LOW (ref 8.9–10.3)
Chloride: 78 mmol/L — ABNORMAL LOW (ref 101–111)
GFR calc Af Amer: 57 mL/min — ABNORMAL LOW (ref >60)
GFR calc non Af Amer: 50 mL/min — ABNORMAL LOW (ref >60)
GLUCOSE: 111 mg/dL — AB (ref 65–99)
POTASSIUM: 2.9 mmol/L — AB (ref 3.5–5.1)
Sodium: 131 mmol/L — ABNORMAL LOW (ref 135–145)
Total Protein: 7.8 g/dL (ref 6.5–8.1)

## 2017-02-25 LAB — BASIC METABOLIC PANEL
Anion gap: 14 (ref 5–15)
BUN: 35 mg/dL — ABNORMAL HIGH (ref 6–20)
CALCIUM: 8.5 mg/dL — AB (ref 8.9–10.3)
CO2: 36 mmol/L — AB (ref 22–32)
CREATININE: 1.52 mg/dL — AB (ref 0.44–1.00)
Chloride: 83 mmol/L — ABNORMAL LOW (ref 101–111)
GFR, EST AFRICAN AMERICAN: 40 mL/min — AB (ref >60)
GFR, EST NON AFRICAN AMERICAN: 35 mL/min — AB (ref >60)
GLUCOSE: 161 mg/dL — AB (ref 65–99)
Potassium: 2.6 mmol/L — CL (ref 3.5–5.1)
Sodium: 133 mmol/L — ABNORMAL LOW (ref 135–145)

## 2017-02-25 LAB — CBC
HCT: 39.8 % (ref 36.0–46.0)
Hemoglobin: 13.4 g/dL (ref 12.0–15.0)
MCH: 27 pg (ref 26.0–34.0)
MCHC: 33.7 g/dL (ref 30.0–36.0)
MCV: 80.2 fL (ref 78.0–100.0)
PLATELETS: 394 10*3/uL (ref 150–400)
RBC: 4.96 MIL/uL (ref 3.87–5.11)
RDW: 15.1 % (ref 11.5–15.5)
WBC: 17.7 10*3/uL — ABNORMAL HIGH (ref 4.0–10.5)

## 2017-02-25 LAB — MAGNESIUM: Magnesium: 2 mg/dL (ref 1.7–2.4)

## 2017-02-25 MED ORDER — METOLAZONE 5 MG PO TABS
5.0000 mg | ORAL_TABLET | Freq: Every day | ORAL | Status: DC
  Administered 2017-02-25: 5 mg via ORAL
  Filled 2017-02-25 (×2): qty 1

## 2017-02-25 MED ORDER — POTASSIUM CHLORIDE CRYS ER 20 MEQ PO TBCR
40.0000 meq | EXTENDED_RELEASE_TABLET | ORAL | Status: AC
  Administered 2017-02-25 (×2): 40 meq via ORAL
  Filled 2017-02-25: qty 2

## 2017-02-25 MED ORDER — SALINE SPRAY 0.65 % NA SOLN
1.0000 | NASAL | Status: DC | PRN
  Filled 2017-02-25: qty 44

## 2017-02-25 MED ORDER — POTASSIUM CHLORIDE CRYS ER 20 MEQ PO TBCR
40.0000 meq | EXTENDED_RELEASE_TABLET | Freq: Two times a day (BID) | ORAL | Status: DC
  Administered 2017-02-25: 40 meq via ORAL
  Filled 2017-02-25: qty 2

## 2017-02-25 NOTE — Progress Notes (Signed)
Progress Note  Patient Name: [name] Date of Encounter: 02/25/2017  Primary Cardiologist: Hilty  Subjective   Breathing slowly progressing. Still mildly tachypneic on non-rebreather/high flow nasal cannula. She is diuresing a little better.   Inpatient Medications    Scheduled Meds: . azithromycin  500 mg Intravenous Q24H  . cefTRIAXone (ROCEPHIN)  IV  1 g Intravenous Q24H  . Chlorhexidine Gluconate Cloth  6 each Topical Daily  . digoxin  0.125 mg Oral Daily  . diltiazem  120 mg Oral Q6H  . furosemide  80 mg Intravenous TID  . magnesium oxide  400 mg Oral BID  . metolazone  5 mg Oral Daily  . metoprolol  5 mg Intravenous Q6H  . montelukast  10 mg Oral QHS  . nicotine  7 mg Transdermal QHS  . pantoprazole  40 mg Oral QHS  . potassium chloride  40 mEq Oral Q2H  . pravastatin  20 mg Oral Daily  . sodium chloride flush  10-40 mL Intracatheter Q12H  . sodium chloride flush  3 mL Intravenous Q12H  . sodium chloride flush  3 mL Intravenous Q12H   Continuous Infusions: . sodium chloride 10 mL/hr at 02/26/2017 0524   PRN Meds: sodium chloride, bisacodyl, levalbuterol, ondansetron **OR** ondansetron (ZOFRAN) IV, oxyCODONE-acetaminophen, promethazine, simethicone, sodium chloride flush, sodium chloride flush   Vital Signs    Vitals:   02/25/17 0200 02/25/17 0328 02/25/17 0400 02/25/17 0500  BP: (!) 165/56  (!) 163/65   Pulse:      Resp: (!) 28  17   Temp:  98.3 F (36.8 C)    TempSrc:  Oral    SpO2: 90%  92%   Weight:    203 lb 7.8 oz (92.3 kg)  Height:        Intake/Output Summary (Last 24 hours) at 02/25/17 0736 Last data filed at 02/25/17 0500  Gross per 24 hour  Intake             1380 ml  Output             3425 ml  Net            -2045 ml   Filed Weights   02/23/17 0500 02/22/2017 0438 02/25/17 0500  Weight: 205 lb 11 oz (93.3 kg) 204 lb 9.4 oz (92.8 kg) 203 lb 7.8 oz (92.3 kg)    Telemetry    AF with rates in the 90's with PVC's - Personally  Reviewed  Physical Exam   GEN: No acute distress, chronically ill appearing Neck: 3 cm JVD Cardiac: irrregular, no murmurs, rubs, or gallops. Clubbing of the fingers noted Respiratory: Clear except for velcro like crackles, tachypneic GI: Soft, nontender, non-distended  MS: Trace-1+ lower extremity edema; No deformity. Neuro:  Nonfocal  Psych: Normal affect   Labs    Chemistry  Recent Labs Lab 02/23/17 0545 02/22/2017 0515 02/25/17 0500  NA 135 131* 131*  K 3.9 4.2 2.9*  CL 86* 82* 78*  CO2 42* 41* 43*  GLUCOSE 126* 109* 111*  BUN 24* 28* 30*  CREATININE 1.03* 0.97 1.13*  CALCIUM 9.1 8.3* 8.8*  PROT  --   --  7.8  ALBUMIN  --   --  2.2*  AST  --   --  76*  ALT  --   --  98*  ALKPHOS  --   --  196*  BILITOT  --   --  0.9  GFRNONAA 55* 60* 50*  GFRAA >60 >  60 57*  ANIONGAP 7 8 10      Hematology  Recent Labs Lab 02/23/17 0545 03/02/2017 0445 02/25/17 0500  WBC 18.6* 19.9* 17.7*  RBC 5.23* 5.03 4.96  HGB 14.3 13.5 13.4  HCT 42.3 39.5 39.8  MCV 80.9 78.5 80.2  MCH 27.3 26.8 27.0  MCHC 33.8 34.2 33.7  RDW 15.2 15.0 15.1  PLT 305 448* 394    Cardiac Enzymes No results for input(s): TROPONINI in the last 168 hours. No results for input(s): TROPIPOC in the last 168 hours.   BNP  Recent Labs Lab 02/21/17 0930  BNP 759.3*     Cardiac Studies   ECHO 02/17/17  - Left ventricle: The cavity size was normal. Wall thickness was normal. Systolic function was normal. The estimated ejection fraction was in the range of 50% to 55%. Wall motion was normal; there were no regional wall motion abnormalities. - Ventricular septum: The contour showed diastolic flattening and systolic flattening. - Left atrium: The atrium was moderately dilated. - Right ventricle: The cavity size was mildly dilated. Systolic function was moderately reduced. - Right atrium: The atrium was moderately dilated. - Tricuspid valve: There was moderate regurgitation. - Pulmonary  arteries: Systolic pressure was severely increased. PA peak pressure: 70 mm Hg (S). - Pericardium, extracardiac: A small pericardial effusion was identified.  Impressions:  - Technically difficult; normal LV systolic function; D shaped septum suggestive of pulmonary hypertension; biatrial enlargement; mild RVE with moderately reduced RV function; moderate TR with severely elevated pulmonary pressure; small pericardial effusion.  Patient Profile     66 y.o. female with pulmonary fibrosis (amio-related?), severe pulmonary HTN, diastolic heart failure with acute exacerbation, persistent atrial fibrillation with ongoing mild hemoptysis, HTN, HLP, mild DM, anemia and history of AVMs  Assessment & Plan    1. CHF (HFpEF)  -acute exacerbation with biventricular manifestations. -CVP still high but improved at 13-15  -Still requiring oxygen - high flow nasal cannula and NRB -I&O negative 2 L in last 24h.  Net negative 11.4 L since admission. Wt down 1 pound overnight, down 7 pounds from max.  -On fluid restriction for hyponatremia. -Plan for R&L heart cath after further diuresis and improved oxygenation -lasix increased yesterday to 80 mg TID and metolazone 5 mg daily to push diuresis. (looks like she got 10 mg of metolazone yesterday) SCr bumped up to 1.16 ( 0.97). K+ 2.9. KDur 71 X2 per IM. Will hold metolazone. Can give metolazone later today if K+ over 3.5.  2. PAH  -severe, difficult to know to what degree it is due to pulmonary fibrosis versus left heart failure. Will need a heart cath after diuresis. We may never get her CVP down, as she appears to have substantial RHF.  3. Afib  -rate is now well controlled in the 80's-90's on increased diltiazem (now 120 mg q6h). Also on metoprolol 5 mg IV q 6h. BP stable. -CHA2DS2/VAS Stroke Risk Score 4 (HTN, CHF, age, female) Unable to anticoagulate with ongoing hemoptysis (and hx of anemia due to GI bleeding and pt refuses).  4.  Acute respiratory failure/hypoxia -due to CHF and pulmonary fibrosis. Improving slowly. -Still requiring high flow oxygen  5. Hypokalemia  -K+ down to 2.9 today. KDur ordered 40 mEq 2 doses this morning by IM. Will add 2 more doses for today.   6. Pulmonary fibrosis  -presumed to be amio related  7. Leukocytosis  -Max 22.2, now 17.7 -Followed by IM. Possible PNA. Treating empirically with Rocephin and azithromycin pending  blood cultures.   Signed, Berton Bon, NP  02/25/2017, 7:36 AM    Personally seen and examined. Agree with above.  Feeling a little bit better today. Perhaps mild shortness of breath. She is still using nonrebreather at night however.  Lungs still with Velcro-like crackles. JVD severe. Tachycardic.  Good diuresis yesterday with metolazone 5 mg, and Lasix 80 mg 3 times a day. With hypokalemia, holding metolazone until potassium replete greater than 3.5. Giving extra potassium this afternoon. We will continue with aggressive diuresis. Creatinine is mildly elevated. Continue to follow. Awaiting right heart catheterization. Note to hospitalist, Dr.Gherghe, you may wish to discuss with [facility] hospitalist team whether or not a transfer to stepdown would be permissible to allow her to undergo her cardiac testing, right heart catheterization in easier fashion. Donato Schultz, MD

## 2017-02-25 NOTE — Progress Notes (Signed)
PROGRESS NOTE  [name] GGY:[id number] DOB: 1951-08-30 DOA: 02/09/2017 PCP: [name], MD   LOS: 9 days    Brief Narrative: 67 y.o.femalewith pulmonary fibrosis (amio-related?), severe pulmonary HTN, diastolic heart failure with acute exacerbation, persistent atrial fibrillation with ongoing mild hemoptysis, HTN, HLP, mild DM, anemia and history of AVMs. Plan is for right and left heart catheterization after adequate diuresis  Assessment & Plan: Principal Problem:   Acute on chronic respiratory failure with hypoxia (HCC) Active Problems:   Essential hypertension   Acute on chronic diastolic CHF (congestive heart failure) (HCC)   Leukocytosis   Schizophrenia (HCC)   CKD (chronic kidney disease) stage 3, GFR 30-59 ml/min   Dyspnea   Bronchiectasis without acute exacerbation (HCC)   PAF (paroxysmal atrial fibrillation) (HCC)   Amiodarone pulmonary toxicity   Atrial fibrillation with RVR (HCC)   Hemoptysis   LD (liver disease)   Pulmonary hypertension   Leukocytosis, concern for pneumonia - patient with increased white count in the last few days, started on antibiotics empirically on 4/2 for concern for pneumonia on chest x-ray, CHF/pulmonary edema versus left-sided pneumonia with loculated effusion.  -started empirically with Ceftriaxone and Azithromycin 4/2.  Continue -CT scan of the chest obtained on 4/3 with mild pulmonary fibrosis as well as evidence of fluid overload/infectious process  Acute Respiratory Failure with Hypoxia suspected to be due to acute on chronic dCHF, right-sided failure -She continues to remain very hypoxic requiring high flow oxygen, cough and mild hemoptysis persist -Appreciate cardiology input -Cath needed to be canceled on 4/3 due to significant hypoxia -She continues to be diuresed, Lasix increased to 80 mg 3 times daily -She is net -11.3 L, weight is 203 from 210 on admission.  Continue fluid restriction as  well  Hypokalemia -Due to Lasix, replete  Amiodarone-induced pulmonary fibrosis -CT scan on 4/3 showed mild interstitial lung disease  Afib with RVR -CHA2DS2 = 4  -declinedanticoagulation previously due to anemia and blood loss, currently not a candidate for anticoagulation due to hemoptysis,  -cardiology following  [facility] dCHF -Patient with symptoms of biventricular failure,recent echocardiogram (02/17/17)with EF of 50-55%, improving acute on chronic diastolic CHF exacerbation, please see diuretic management as above Continue fluid restriction (sodium has normalized)  Severe Pulmonary HTN -patient with history of pulmonary fibrosis, presumablysecondary to amiodarone toxicity, patient has COPD at baseline as well, Rtheart cath once adequately diureses and CVP <10 echocardiogram from 02/17/2017 confirms severe pulmonary hypertension and moderately reducedRV function, continue bronchodilators  Microcytic anemia -resolved anemia, follow H&H, EGD and Colonoscopy in 2014 normal (Dr Dionne Bucy stopped her PPI 4 months ago, follow H&H, so far hemoglobin has been stable around 13-14  CKD 2 to 3 -monitor renal function and electrolytes closely with diuresis, replace potassium and magnesium and recheck lytes  HTN -stable, continue oral Cardizem and IV metoprolol for now   DVT prophylaxis: SCD Code Status: Full code Family Communication: no family bedside Disposition Plan: remain in SDU  Consultants:   Cardiology   Procedures:   2D echo Impressions: - Technically difficult; normal LV systolic function; D shaped septum suggestive of pulmonary hypertension; biatrial enlargement; mild RVE with moderately reduced RV function; moderate TR with severely elevated pulmonary pressure; small pericardial effusion.  Antimicrobials:  Ceftriaxone 4/2 >>  Azithromycin 4/2 >>  Subjective: -Continues to feel short of breath even when she walks a bedside commode, on my evaluation  this morning satting 89-91% on a nonrebreather  Objective: Vitals:   02/25/17 0500 02/25/17 0700 02/25/17 0800 02/25/17  0900  BP:   125/80   Pulse:      Resp:   20 (!) 21  Temp:  (!) 96.4 F (35.8 C)    TempSrc:  Axillary    SpO2:   91% 93%  Weight: 92.3 kg (203 lb 7.8 oz)     Height:        Intake/Output Summary (Last 24 hours) at 02/25/17 1046 Last data filed at 02/25/17 0900  Gross per 24 hour  Intake             1270 ml  Output             2925 ml  Net            -1655 ml   Filed Weights   02/23/17 0500 2017-03-08 0438 02/25/17 0500  Weight: 93.3 kg (205 lb 11 oz) 92.8 kg (204 lb 9.4 oz) 92.3 kg (203 lb 7.8 oz)    Examination: Constitutional: NAD Vitals:   02/25/17 0500 02/25/17 0700 02/25/17 0800 02/25/17 0900  BP:   125/80   Pulse:      Resp:   20 (!) 21  Temp:  (!) 96.4 F (35.8 C)    TempSrc:  Axillary    SpO2:   91% 93%  Weight: 92.3 kg (203 lb 7.8 oz)     Height:       Eyes: PERRL, lids and conjunctivae normal ENMT: Mucous membranes are moist. No oropharyngeal exudates Neck: normal, supple, no masses, no thyromegaly Respiratory: Coarse breath sounds bilaterally, Velcro type sounds present, no wheezing Cardiovascular: irregular, 1-2+ LE edema. 2+ pedal pulses. Abdomen: no tenderness. Bowel sounds positive.  Musculoskeletal: no clubbing / cyanosis. No joint deformity upper and lower extremities.  Skin: no rashes, lesions, ulcers. No induration Neurologic: CN 2-12 grossly intact. Strength 5/5 in all 4.  Psychiatric: Normal judgment and insight. Alert and oriented x 3. Normal mood.    Data Reviewed: I have personally reviewed following labs and imaging studies  CBC:  Recent Labs Lab 02/19/17 0530 02/21/17 0500 02/23/17 0545 03/08/2017 0445 02/25/17 0500  WBC 20.3* 16.6* 18.6* 19.9* 17.7*  HGB 14.4 14.1 14.3 13.5 13.4  HCT 42.0 41.9 42.3 39.5 39.8  MCV 79.5 80.0 80.9 78.5 80.2  PLT 229 262 305 448* 394   Basic Metabolic Panel:  Recent  Labs Lab 02/19/17 0530 02/20/17 0523 02/21/17 0500 02/22/17 0435 02/23/17 0545 03/08/2017 0515 02/25/17 0500  NA 134* 133* 131* 133* 135 131* 131*  K 3.0* 3.0* 3.4* 4.0 3.9 4.2 2.9*  CL 90* 86* 83* 87* 86* 82* 78*  CO2 35* 39* 38* 38* 42* 41* 43*  GLUCOSE 130* 138* 109* 134* 126* 109* 111*  BUN 20 20 23* 28* 24* 28* 30*  CREATININE 0.94 1.10* 0.97 0.95 1.03* 0.97 1.13*  CALCIUM 8.2* 8.3* 8.4* 8.8* 9.1 8.3* 8.8*  MG 1.7 2.0  --   --   --   --  2.0   GFR: Estimated Creatinine Clearance: 56.1 mL/min (A) (by C-G formula based on SCr of 1.13 mg/dL (H)). Liver Function Tests:  Recent Labs Lab 02/25/17 0500  AST 76*  ALT 98*  ALKPHOS 196*  BILITOT 0.9  PROT 7.8  ALBUMIN 2.2*   No results for input(s): LIPASE, AMYLASE in the last 168 hours. No results for input(s): AMMONIA in the last 168 hours. Coagulation Profile:  Recent Labs Lab March 08, 2017 0445  INR 1.15   Cardiac Enzymes: No results for input(s): CKTOTAL, CKMB, CKMBINDEX, TROPONINI in the last  168 hours. BNP (last 3 results)  Recent Labs  02/28/16 1145  PROBNP 837.0*   HbA1C: No results for input(s): HGBA1C in the last 72 hours. CBG: No results for input(s): GLUCAP in the last 168 hours. Lipid Profile: No results for input(s): CHOL, HDL, LDLCALC, TRIG, CHOLHDL, LDLDIRECT in the last 72 hours. Thyroid Function Tests: No results for input(s): TSH, T4TOTAL, FREET4, T3FREE, THYROIDAB in the last 72 hours. Anemia Panel: No results for input(s): VITAMINB12, FOLATE, FERRITIN, TIBC, IRON, RETICCTPCT in the last 72 hours. Urine analysis:    Component Value Date/Time   COLORURINE YELLOW 02/23/2017 0929   APPEARANCEUR CLEAR 02/23/2017 0929   LABSPEC 1.011 02/23/2017 0929   PHURINE 7.0 02/23/2017 0929   GLUCOSEU NEGATIVE 02/23/2017 0929   HGBUR NEGATIVE 02/23/2017 0929   BILIRUBINUR NEGATIVE 02/23/2017 0929   KETONESUR NEGATIVE 02/23/2017 0929   PROTEINUR NEGATIVE 02/23/2017 0929   UROBILINOGEN 0.2 10/07/2015  1055   NITRITE NEGATIVE 02/23/2017 0929   LEUKOCYTESUR NEGATIVE 02/23/2017 0929   Sepsis Labs: Invalid input(s): PROCALCITONIN, LACTICIDVEN  Recent Results (from the past 240 hour(s))  MRSA PCR Screening     Status: None   Collection Time: 2017/03/16  1:00 PM  Result Value Ref Range Status   MRSA by PCR NEGATIVE NEGATIVE Final    Comment:        The GeneXpert MRSA Assay (FDA approved for NASAL specimens only), is one component of a comprehensive MRSA colonization surveillance program. It is not intended to diagnose MRSA infection nor to guide or monitor treatment for MRSA infections.   Culture, blood (Routine X 2) w Reflex to ID Panel     Status: None (Preliminary result)   Collection Time: 02/23/17  9:34 AM  Result Value Ref Range Status   Specimen Description BLOOD LEFT HAND  Final   Special Requests IN PEDIATRIC BOTTLE BCAV  Final   Culture   Final    NO GROWTH 1 DAY Performed at [facility], [address], [place], [place] [address]    Report Status PENDING  Incomplete  Culture, blood (Routine X 2) w Reflex to ID Panel     Status: None (Preliminary result)   Collection Time: 02/23/17  9:40 AM  Result Value Ref Range Status   Specimen Description BLOOD LEFT ARM  Final   Special Requests IN PEDIATRIC BOTTLE BCAV  Final   Culture   Final    NO GROWTH 1 DAY Performed at [facility], [address], [place], [place] [address]    Report Status PENDING  Incomplete      Radiology Studies: Ct Chest Wo Contrast  Result Date: 03/02/2017 CLINICAL DATA:  Pulmonary fibrosis, hypertension, GERD, atrial fibrillation, chronic diastolic CHF, chronic kidney disease stage III EXAM: CT CHEST WITHOUT CONTRAST TECHNIQUE: Multidetector CT imaging of the chest was performed following the standard protocol without IV contrast. Sagittal and coronal MPR images reconstructed from axial data set. COMPARISON:  10/07/2015 CT chest.  Chest radiograph 02/23/2017 FINDINGS:  Cardiovascular: Atherosclerotic calcifications aorta, coronary arteries, and proximal great vessels. Aorta normal caliber without aneurysmal dilatation. Minimal pericardial effusion. Enlargement of cardiac chambers especially the atria. RIGHT arm PICC line with tip in SVC. Mediastinum/Nodes: Enlarged RIGHT paratracheal node 2.3 cm image 48 previously 1.5 cm. Enlarged prevascular node 14 mm image 43, new. Additional enlarged prevascular node 18 mm short axis image 49 previously 10 mm. Enlarged precarinal node 12 mm short axis image 53. Esophagus unremarkable. Base of cervical region normal appearance. Lungs/Pleura: Moderate  LEFT pleural effusion. Small RIGHT pleural effusion. Scattered respiratory motion artifacts. Extensive infiltrates throughout both lungs which likely represent either acute edema or infection superimposed upon a mild degree of underlying chronic lung disease seen on the previous study. Compressive atelectasis LEFT lower lobe. No pneumothorax. Upper Abdomen: Unremarkable Musculoskeletal: Unremarkable IMPRESSION: Aortic atherosclerosis and coronary arterial calcification. Enlargement of cardiac chambers especially atria. BILATERAL pulmonary infiltrates likely represent combination of superimposed edema or less likely infection on a background of mild interstitial lung disease. BILATERAL pleural effusions LEFT greater than RIGHT. Increased mediastinal adenopathy since previous exam, question reactive in the setting of acute infiltrates. Electronically Signed   By: Ulyses Southward M.D.   On: 03/13/2017 14:54     Scheduled Meds: . azithromycin  500 mg Intravenous Q24H  . cefTRIAXone (ROCEPHIN)  IV  1 g Intravenous Q24H  . Chlorhexidine Gluconate Cloth  6 each Topical Daily  . digoxin  0.125 mg Oral Daily  . diltiazem  120 mg Oral Q6H  . furosemide  80 mg Intravenous TID  . magnesium oxide  400 mg Oral BID  . metolazone  5 mg Oral Daily  . metoprolol  5 mg Intravenous Q6H  . montelukast  10 mg  Oral QHS  . nicotine  7 mg Transdermal QHS  . pantoprazole  40 mg Oral QHS  . potassium chloride  40 mEq Oral BID  . pravastatin  20 mg Oral Daily  . sodium chloride flush  10-40 mL Intracatheter Q12H  . sodium chloride flush  3 mL Intravenous Q12H  . sodium chloride flush  3 mL Intravenous Q12H   Continuous Infusions: . sodium chloride 10 mL/hr at 02/25/17 0900   Pamella Pert, MD, PhD [facility] Pager [phone]  If 7PM-7AM, please contact night-coverage [email or web address] Password [email or web address] 02/25/2017, 10:46 AM

## 2017-02-26 ENCOUNTER — Encounter (HOSPITAL_COMMUNITY): Payer: Self-pay | Admitting: Certified Registered"

## 2017-02-26 ENCOUNTER — Inpatient Hospital Stay (HOSPITAL_COMMUNITY): Payer: Medicare Other

## 2017-02-26 DIAGNOSIS — I442 Atrioventricular block, complete: Secondary | ICD-10-CM | POA: Diagnosis not present

## 2017-02-26 DIAGNOSIS — I469 Cardiac arrest, cause unspecified: Secondary | ICD-10-CM

## 2017-02-26 DIAGNOSIS — N189 Chronic kidney disease, unspecified: Secondary | ICD-10-CM

## 2017-02-26 DIAGNOSIS — N289 Disorder of kidney and ureter, unspecified: Secondary | ICD-10-CM

## 2017-02-26 LAB — BLOOD GAS, ARTERIAL
ACID-BASE EXCESS: 11.4 mmol/L — AB (ref 0.0–2.0)
Bicarbonate: 38.8 mmol/L — ABNORMAL HIGH (ref 20.0–28.0)
Drawn by: 308601
FIO2: 100
LHR: 20 {breaths}/min
O2 SAT: 90.5 %
PEEP/CPAP: 5 cmH2O
Patient temperature: 37
VT: 480 mL
pCO2 arterial: 63.5 mmHg — ABNORMAL HIGH (ref 32.0–48.0)
pH, Arterial: 7.403 (ref 7.350–7.450)
pO2, Arterial: 69.3 mmHg — ABNORMAL LOW (ref 83.0–108.0)

## 2017-02-26 LAB — CBC
HCT: 41.3 % (ref 36.0–46.0)
HEMATOCRIT: 41.2 % (ref 36.0–46.0)
Hemoglobin: 13.6 g/dL (ref 12.0–15.0)
Hemoglobin: 13.8 g/dL (ref 12.0–15.0)
MCH: 27 pg (ref 26.0–34.0)
MCH: 27.2 pg (ref 26.0–34.0)
MCHC: 33 g/dL (ref 30.0–36.0)
MCHC: 33.4 g/dL (ref 30.0–36.0)
MCV: 81.7 fL (ref 78.0–100.0)
MCV: 81.3 fL (ref 78.0–100.0)
Platelets: 382 10*3/uL (ref 150–400)
Platelets: 416 10*3/uL — ABNORMAL HIGH (ref 150–400)
RBC: 5.04 MIL/uL (ref 3.87–5.11)
RBC: 5.08 MIL/uL (ref 3.87–5.11)
RDW: 15 % (ref 11.5–15.5)
RDW: 15 % (ref 11.5–15.5)
WBC: 14.7 10*3/uL — ABNORMAL HIGH (ref 4.0–10.5)
WBC: 25.7 10*3/uL — ABNORMAL HIGH (ref 4.0–10.5)

## 2017-02-26 LAB — COMPREHENSIVE METABOLIC PANEL
ALBUMIN: 2.2 g/dL — AB (ref 3.5–5.0)
ALK PHOS: 258 U/L — AB (ref 38–126)
ALT: 149 U/L — ABNORMAL HIGH (ref 14–54)
AST: 185 U/L — AB (ref 15–41)
Anion gap: 16 — ABNORMAL HIGH (ref 5–15)
BILIRUBIN TOTAL: 1.1 mg/dL (ref 0.3–1.2)
BUN: 43 mg/dL — AB (ref 6–20)
CO2: 35 mmol/L — AB (ref 22–32)
Calcium: 8.6 mg/dL — ABNORMAL LOW (ref 8.9–10.3)
Chloride: 83 mmol/L — ABNORMAL LOW (ref 101–111)
Creatinine, Ser: 1.98 mg/dL — ABNORMAL HIGH (ref 0.44–1.00)
GFR calc Af Amer: 29 mL/min — ABNORMAL LOW (ref >60)
GFR calc non Af Amer: 25 mL/min — ABNORMAL LOW (ref >60)
GLUCOSE: 185 mg/dL — AB (ref 65–99)
POTASSIUM: 4.2 mmol/L (ref 3.5–5.1)
Sodium: 134 mmol/L — ABNORMAL LOW (ref 135–145)
TOTAL PROTEIN: 7.6 g/dL (ref 6.5–8.1)

## 2017-02-26 LAB — TROPONIN I
TROPONIN I: 0.14 ng/mL — AB (ref <0.03)
TROPONIN I: 0.15 ng/mL — AB (ref <0.03)
Troponin I: 0.04 ng/mL (ref <0.03)

## 2017-02-26 LAB — BASIC METABOLIC PANEL
ANION GAP: 16 — AB (ref 5–15)
ANION GAP: 15 (ref 5–15)
Anion gap: 19 — ABNORMAL HIGH (ref 5–15)
Anion gap: 12 (ref 5–15)
BUN: 39 mg/dL — AB (ref 6–20)
BUN: 43 mg/dL — ABNORMAL HIGH (ref 6–20)
BUN: 44 mg/dL — ABNORMAL HIGH (ref 6–20)
BUN: 47 mg/dL — ABNORMAL HIGH (ref 6–20)
CALCIUM: 8.7 mg/dL — AB (ref 8.9–10.3)
CALCIUM: 8 mg/dL — AB (ref 8.9–10.3)
CALCIUM: 8.5 mg/dL — AB (ref 8.9–10.3)
CHLORIDE: 83 mmol/L — AB (ref 101–111)
CO2: 41 mmol/L — ABNORMAL HIGH (ref 22–32)
CO2: 35 mmol/L — AB (ref 22–32)
CO2: 37 mmol/L — ABNORMAL HIGH (ref 22–32)
CO2: 38 mmol/L — AB (ref 22–32)
CREATININE: 1.65 mg/dL — AB (ref 0.44–1.00)
CREATININE: 1.85 mg/dL — AB (ref 0.44–1.00)
Calcium: 8.4 mg/dL — ABNORMAL LOW (ref 8.9–10.3)
Chloride: 75 mmol/L — ABNORMAL LOW (ref 101–111)
Chloride: 80 mmol/L — ABNORMAL LOW (ref 101–111)
Chloride: 82 mmol/L — ABNORMAL LOW (ref 101–111)
Creatinine, Ser: 1.9 mg/dL — ABNORMAL HIGH (ref 0.44–1.00)
Creatinine, Ser: 1.82 mg/dL — ABNORMAL HIGH (ref 0.44–1.00)
GFR calc Af Amer: 31 mL/min — ABNORMAL LOW (ref >60)
GFR calc Af Amer: 32 mL/min — ABNORMAL LOW (ref >60)
GFR calc Af Amer: 32 mL/min — ABNORMAL LOW (ref >60)
GFR calc non Af Amer: 31 mL/min — ABNORMAL LOW (ref >60)
GFR calc non Af Amer: 27 mL/min — ABNORMAL LOW (ref >60)
GFR, EST AFRICAN AMERICAN: 36 mL/min — AB (ref >60)
GFR, EST NON AFRICAN AMERICAN: 26 mL/min — AB (ref >60)
GFR, EST NON AFRICAN AMERICAN: 28 mL/min — AB (ref >60)
GLUCOSE: 222 mg/dL — AB (ref 65–99)
Glucose, Bld: 224 mg/dL — ABNORMAL HIGH (ref 65–99)
Glucose, Bld: 273 mg/dL — ABNORMAL HIGH (ref 65–99)
Glucose, Bld: 226 mg/dL — ABNORMAL HIGH (ref 65–99)
Potassium: 2.7 mmol/L — CL (ref 3.5–5.1)
Potassium: 4.5 mmol/L (ref 3.5–5.1)
Potassium: 4.1 mmol/L (ref 3.5–5.1)
Potassium: 4.3 mmol/L (ref 3.5–5.1)
SODIUM: 135 mmol/L (ref 135–145)
Sodium: 131 mmol/L — ABNORMAL LOW (ref 135–145)
Sodium: 134 mmol/L — ABNORMAL LOW (ref 135–145)
Sodium: 133 mmol/L — ABNORMAL LOW (ref 135–145)

## 2017-02-26 LAB — GLUCOSE, CAPILLARY
GLUCOSE-CAPILLARY: 116 mg/dL — AB (ref 65–99)
GLUCOSE-CAPILLARY: 164 mg/dL — AB (ref 65–99)
GLUCOSE-CAPILLARY: 185 mg/dL — AB (ref 65–99)
Glucose-Capillary: 134 mg/dL — ABNORMAL HIGH (ref 65–99)

## 2017-02-26 LAB — PROCALCITONIN: PROCALCITONIN: 0.45 ng/mL

## 2017-02-26 LAB — BODY FLUID CELL COUNT WITH DIFFERENTIAL
EOS FL: 1 %
Lymphs, Fluid: 32 %
MONOCYTE-MACROPHAGE-SEROUS FLUID: 9 % — AB (ref 50–90)
Neutrophil Count, Fluid: 60 % — ABNORMAL HIGH (ref 0–25)
Other Cells, Fluid: UNDETERMINED %

## 2017-02-26 LAB — MAGNESIUM
MAGNESIUM: 2.5 mg/dL — AB (ref 1.7–2.4)
MAGNESIUM: 2.8 mg/dL — AB (ref 1.7–2.4)
Magnesium: 2.4 mg/dL (ref 1.7–2.4)

## 2017-02-26 LAB — PHOSPHORUS: PHOSPHORUS: 4.6 mg/dL (ref 2.5–4.6)

## 2017-02-26 LAB — LACTIC ACID, PLASMA
LACTIC ACID, VENOUS: 5.7 mmol/L — AB (ref 0.5–1.9)
Lactic Acid, Venous: 10 mmol/L (ref 0.5–1.9)
Lactic Acid, Venous: 5.2 mmol/L (ref 0.5–1.9)

## 2017-02-26 LAB — DIGOXIN LEVEL: DIGOXIN LVL: 2.5 ng/mL — AB (ref 0.8–2.0)

## 2017-02-26 MED ORDER — SODIUM CHLORIDE 0.9 % IV SOLN: 30.0000 meq | Freq: Once | INTRAVENOUS | Status: DC

## 2017-02-26 MED ORDER — POTASSIUM CHLORIDE 20 MEQ/15ML (10%) PO SOLN
40.0000 meq | Freq: Two times a day (BID) | ORAL | Status: DC
  Administered 2017-02-26 (×2): 40 meq
  Filled 2017-02-26 (×3): qty 30

## 2017-02-26 MED ORDER — PRAVASTATIN SODIUM 20 MG PO TABS
20.0000 mg | ORAL_TABLET | Freq: Every day | ORAL | Status: DC
  Administered 2017-02-27 – 2017-03-02 (×4): 20 mg
  Filled 2017-02-26 (×4): qty 1

## 2017-02-26 MED ORDER — DEXTROSE 5 % IV SOLN
2.0000 g | INTRAVENOUS | Status: DC
  Administered 2017-02-27: 2 g via INTRAVENOUS
  Filled 2017-02-26: qty 2

## 2017-02-26 MED ORDER — ORAL CARE MOUTH RINSE
15.0000 mL | Freq: Four times a day (QID) | OROMUCOSAL | Status: DC
  Administered 2017-02-26 – 2017-03-03 (×21): 15 mL via OROMUCOSAL

## 2017-02-26 MED ORDER — METOLAZONE 5 MG PO TABS
5.0000 mg | ORAL_TABLET | Freq: Every day | ORAL | Status: DC
  Filled 2017-02-26: qty 1

## 2017-02-26 MED ORDER — FENTANYL BOLUS VIA INFUSION
25.0000 ug | INTRAVENOUS | Status: DC | PRN
  Administered 2017-02-26: 50 ug via INTRAVENOUS
  Administered 2017-02-26 (×2): 25 ug via INTRAVENOUS
  Administered 2017-02-26: 50 ug via INTRAVENOUS
  Administered 2017-02-27: 25 ug via INTRAVENOUS
  Filled 2017-02-26: qty 25

## 2017-02-26 MED ORDER — AMIODARONE HCL IN DEXTROSE 360-4.14 MG/200ML-% IV SOLN
INTRAVENOUS | Status: AC
  Filled 2017-02-26: qty 200

## 2017-02-26 MED ORDER — MAGNESIUM SULFATE 2 GM/50ML IV SOLN
2.0000 g | Freq: Once | INTRAVENOUS | Status: AC
  Administered 2017-02-26: 2 g via INTRAVENOUS

## 2017-02-26 MED ORDER — VANCOMYCIN HCL 10 G IV SOLR
1250.0000 mg | INTRAVENOUS | Status: DC
  Administered 2017-02-26: 1250 mg via INTRAVENOUS
  Filled 2017-02-26: qty 1250

## 2017-02-26 MED ORDER — MAGNESIUM SULFATE 2 GM/50ML IV SOLN
2.0000 g | INTRAVENOUS | Status: AC
  Filled 2017-02-26: qty 50

## 2017-02-26 MED ORDER — AMIODARONE HCL IN DEXTROSE 360-4.14 MG/200ML-% IV SOLN
30.0000 mg/h | INTRAVENOUS | Status: DC
  Administered 2017-02-26 (×3): 30 mg/h via INTRAVENOUS
  Filled 2017-02-26 (×4): qty 200

## 2017-02-26 MED ORDER — DEXTROSE 5 % IV SOLN
2.0000 g | INTRAVENOUS | Status: AC
  Administered 2017-02-26: 2 g via INTRAVENOUS
  Filled 2017-02-26: qty 2

## 2017-02-26 MED ORDER — VITAL HIGH PROTEIN PO LIQD
1000.0000 mL | ORAL | Status: DC
  Administered 2017-02-28 – 2017-03-01 (×2): 1000 mL
  Filled 2017-02-26 (×4): qty 1000

## 2017-02-26 MED ORDER — LIP MEDEX EX OINT
TOPICAL_OINTMENT | CUTANEOUS | Status: AC
  Administered 2017-02-26: 15:00:00
  Filled 2017-02-26: qty 7

## 2017-02-26 MED ORDER — DIGOXIN 125 MCG PO TABS: 0.1250 mg | ORAL_TABLET | Freq: Every day | ORAL | Status: DC

## 2017-02-26 MED ORDER — FENTANYL CITRATE (PF) 100 MCG/2ML IJ SOLN
50.0000 ug | Freq: Once | INTRAMUSCULAR | Status: AC
  Administered 2017-02-26: 50 ug via INTRAVENOUS

## 2017-02-26 MED ORDER — INSULIN ASPART 100 UNIT/ML ~~LOC~~ SOLN
0.0000 [IU] | SUBCUTANEOUS | Status: DC
  Administered 2017-02-26: 4 [IU] via SUBCUTANEOUS
  Administered 2017-02-26: 3 [IU] via SUBCUTANEOUS
  Administered 2017-02-26 – 2017-02-27 (×2): 4 [IU] via SUBCUTANEOUS
  Administered 2017-02-27: 3 [IU] via SUBCUTANEOUS
  Administered 2017-02-27: 4 [IU] via SUBCUTANEOUS
  Administered 2017-02-27: 3 [IU] via SUBCUTANEOUS
  Administered 2017-02-28: 4 [IU] via SUBCUTANEOUS
  Administered 2017-02-28: 3 [IU] via SUBCUTANEOUS
  Administered 2017-02-28 – 2017-03-01 (×2): 4 [IU] via SUBCUTANEOUS
  Administered 2017-03-01: 3 [IU] via SUBCUTANEOUS
  Administered 2017-03-01: 4 [IU] via SUBCUTANEOUS
  Administered 2017-03-01: 3 [IU] via SUBCUTANEOUS
  Administered 2017-03-01: 4 [IU] via SUBCUTANEOUS
  Administered 2017-03-02 (×2): 3 [IU] via SUBCUTANEOUS
  Administered 2017-03-02 – 2017-03-03 (×3): 4 [IU] via SUBCUTANEOUS
  Administered 2017-03-03 (×3): 7 [IU] via SUBCUTANEOUS

## 2017-02-26 MED ORDER — PANTOPRAZOLE SODIUM 40 MG IV SOLR
40.0000 mg | INTRAVENOUS | Status: DC
  Administered 2017-02-26 – 2017-02-27 (×2): 40 mg via INTRAVENOUS
  Filled 2017-02-26 (×2): qty 40

## 2017-02-26 MED ORDER — FAMOTIDINE IN NACL 20-0.9 MG/50ML-% IV SOLN
20.0000 mg | Freq: Two times a day (BID) | INTRAVENOUS | Status: DC
  Administered 2017-02-26 (×2): 20 mg via INTRAVENOUS
  Filled 2017-02-26 (×2): qty 50

## 2017-02-26 MED ORDER — CHLORHEXIDINE GLUCONATE 0.12% ORAL RINSE (MEDLINE KIT)
15.0000 mL | Freq: Two times a day (BID) | OROMUCOSAL | Status: DC
  Administered 2017-02-26 – 2017-03-03 (×11): 15 mL via OROMUCOSAL

## 2017-02-26 MED ORDER — METHYLPREDNISOLONE SODIUM SUCC 125 MG IJ SOLR
80.0000 mg | Freq: Four times a day (QID) | INTRAMUSCULAR | Status: DC
  Administered 2017-02-26 – 2017-02-28 (×9): 80 mg via INTRAVENOUS
  Filled 2017-02-26 (×10): qty 2

## 2017-02-26 MED ORDER — POTASSIUM CHLORIDE 20 MEQ/15ML (10%) PO SOLN
40.0000 meq | Freq: Once | ORAL | Status: DC
  Filled 2017-02-26: qty 30

## 2017-02-26 MED ORDER — MIDAZOLAM HCL 2 MG/2ML IJ SOLN
1.0000 mg | INTRAMUSCULAR | Status: AC | PRN
  Administered 2017-02-26 (×3): 1 mg via INTRAVENOUS
  Filled 2017-02-26 (×3): qty 2

## 2017-02-26 MED ORDER — POTASSIUM CHLORIDE 20 MEQ/15ML (10%) PO SOLN
80.0000 meq | Freq: Once | ORAL | Status: AC
  Administered 2017-02-26: 80 meq

## 2017-02-26 MED ORDER — SODIUM CHLORIDE 0.9 % IV SOLN
25.0000 ug/h | INTRAVENOUS | Status: DC
  Administered 2017-02-26: 50 ug/h via INTRAVENOUS
  Administered 2017-02-27: 325 ug/h via INTRAVENOUS
  Administered 2017-02-27: 375 ug/h via INTRAVENOUS
  Administered 2017-02-27: 325 ug/h via INTRAVENOUS
  Administered 2017-02-27: 140 ug/h via INTRAVENOUS
  Administered 2017-02-28 – 2017-03-01 (×3): 200 ug/h via INTRAVENOUS
  Administered 2017-03-02: 225 ug/h via INTRAVENOUS
  Administered 2017-03-02: 175 ug/h via INTRAVENOUS
  Filled 2017-02-26 (×10): qty 50

## 2017-02-26 MED ORDER — MIDAZOLAM HCL 2 MG/2ML IJ SOLN
INTRAMUSCULAR | Status: AC
  Administered 2017-02-26: 2 mg
  Filled 2017-02-26: qty 2

## 2017-02-26 MED ORDER — FENTANYL CITRATE (PF) 100 MCG/2ML IJ SOLN
50.0000 ug | INTRAMUSCULAR | Status: DC | PRN
  Filled 2017-02-26 (×2): qty 2

## 2017-02-26 MED ORDER — LIDOCAINE BOLUS VIA INFUSION
100.0000 mg | Freq: Once | INTRAVENOUS | Status: AC
  Administered 2017-02-26: 100 mg via INTRAVENOUS
  Filled 2017-02-26: qty 100

## 2017-02-26 MED ORDER — LIDOCAINE IN D5W 4-5 MG/ML-% IV SOLN
2.0000 mg/min | INTRAVENOUS | Status: DC
Start: 2017-02-26 — End: 2017-02-27
  Administered 2017-02-26: 2 mg/min via INTRAVENOUS
  Filled 2017-02-26: qty 500

## 2017-02-26 MED ORDER — AMIODARONE IV BOLUS ONLY 150 MG/100ML
150.0000 mg | Freq: Once | INTRAVENOUS | Status: AC
  Administered 2017-02-26: 150 mg via INTRAVENOUS

## 2017-02-26 MED ORDER — VITAL HIGH PROTEIN PO LIQD
1000.0000 mL | ORAL | Status: DC
  Filled 2017-02-26: qty 1000

## 2017-02-26 MED ORDER — FENTANYL CITRATE (PF) 100 MCG/2ML IJ SOLN: 50.0000 ug | INTRAMUSCULAR | Status: DC | PRN

## 2017-02-26 MED ORDER — AMIODARONE HCL IN DEXTROSE 360-4.14 MG/200ML-% IV SOLN
60.0000 mg/h | INTRAVENOUS | Status: AC
  Administered 2017-02-26 (×2): 60 mg/h via INTRAVENOUS

## 2017-02-26 MED ORDER — POTASSIUM CHLORIDE 10 MEQ/100ML IV SOLN
10.0000 meq | INTRAVENOUS | Status: AC
  Administered 2017-02-26 (×3): 10 meq via INTRAVENOUS
  Filled 2017-02-26 (×3): qty 100

## 2017-02-26 MED ORDER — FUROSEMIDE 10 MG/ML IJ SOLN
80.0000 mg | Freq: Once | INTRAMUSCULAR | Status: AC
  Administered 2017-02-26: 80 mg via INTRAVENOUS
  Filled 2017-02-26: qty 8

## 2017-02-26 MED ORDER — MIDAZOLAM HCL 2 MG/2ML IJ SOLN
1.0000 mg | INTRAMUSCULAR | Status: DC | PRN
  Administered 2017-02-26 – 2017-02-28 (×8): 1 mg via INTRAVENOUS
  Filled 2017-02-26 (×10): qty 2

## 2017-02-26 MED FILL — Medication: Qty: 1 | Status: AC

## 2017-02-26 NOTE — Progress Notes (Signed)
Pharmacy Antibiotic Note  [name] is a 66 y.o. female admitted on 02/07/2017 now with sepsis.  Pharmacy has been consulted for cefepime and vancomycin dosing.  Plan: Cefepime 2 Gm IV q24h Vancomycin 1250 mg IV q24h  VT=15-20 mg/L F/u Scr/cultures/levels as needed  Height: 5\' 6"  (167.6 cm) Weight: 203 lb 7.8 oz (92.3 kg) IBW/kg (Calculated) : 59.3  Temp (24hrs), Avg:97.5 F (36.4 C), Min:96.4 F (35.8 C), Max:98.2 F (36.8 C)   Recent Labs Lab 02/21/17 0500  02/23/17 0545 02/23/2017 0445 03/07/2017 0515 02/25/17 0500 02/25/17 1526 02/26/17 0315 02/26/17 0318  WBC 16.6*  --  18.6* 19.9*  --  17.7*  --  14.7*  --   CREATININE 0.97  < > 1.03*  --  0.97 1.13* 1.52* 1.65*  --   LATICACIDVEN  --   --   --   --   --   --   --   --  10.0*  < > = values in this interval not displayed.  Estimated Creatinine Clearance: 38.4 mL/min (A) (by C-G formula based on SCr of 1.65 mg/dL (H)).    Allergies  Allergen Reactions  . Ativan [Lorazepam] Other (See Comments)    Ativan has opposite effect. She becomes agitated and confused.   . Levaquin [Levofloxacin] Hives  . Penicillins Swelling    Has patient had a PCN reaction causing immediate rash, facial/tongue/throat swelling, SOB or lightheadedness with hypotension yes Has patient had a PCN reaction causing severe rash involving mucus membranes or skin necrosis: unknown Has patient had a PCN reaction that required hospitalization yes Has patient had a PCN reaction occurring within the last 10 years: no If all of the above answers are "NO", then may proceed with Cephalosporin use.     Antimicrobials this admission: 4/2 roc/zmax >> 4/5 4/5 cefepime >>  4/5 vancomycin >>   Dose adjustments this admission:   Microbiology results:  BCx:   UCx:    Sputum:    MRSA PCR:   Thank you for allowing pharmacy to be a part of this patient's care.  6/2 02/26/2017 4:18 AM

## 2017-02-26 NOTE — Progress Notes (Signed)
PULMONARY / CRITICAL CARE MEDICINE   Name: [name] MRN: [id number] DOB: 06-16-1951    ADMISSION DATE:  01/26/2017 CONSULTATION DATE:  02/26/17  REFERRING MD:  Dr. Cruzita Lederer  CHIEF COMPLAINT:  SOB  HISTORY OF PRESENT ILLNESS:   66 yo F with hx of pAfib (not on anticoag due to hx of GI bleed), pulm fibrosis (likely from [facility]), chronic hypoxic resp failure, dCHF, CKD III, presented to the hospital on 3/26 with SOB and cough. Cough x2 months with frothy sputum which became blood tinged prior to admission, with some leg edema and orthopnea. She developed Afib with RVR, CXR showed interstitial edema along with 8 lb weight increase suggestive of acute DCHF. Was put on lasix, dilt gtt and digoxin. She could not tolerate bipap, we were consulted due to hemoptysis and hypoxic resp failure.   Signed off 3/28 with plans for outpatient follow up.  Early AM 4/5, pt had V.fib arrest requiring defib x 4, epi, bicarb.  V.fib would break momentarily after defib but then recur immediately. It was not until amio was started did V.fib break fully.  She was intubated by anesthesia and PCCM to assume care. Of note, following intubation, she had frank hemoptysis again, dark colored.  Autoimmune labs from 3/28 as follows: ESR neg, Anti Scl-70 neg, CRP elevated 19.1, dsDNA neg, CCP antibodies elevated at 36, ANCA neg, GBM Ab neg,  RF elevated at 15.7   SUBJECTIVE:  On vent, agitated.  VITAL SIGNS: BP 120/67 (BP Location: Left Arm)   Pulse 96   Temp 98 F (36.7 C) (Oral)   Resp (!) 32   Ht _0  (1.676 m)   Wt 203 lb 7.8 oz (92.3 kg)   SpO2 97%   BMI 32.84 kg/m   HEMODYNAMICS: CVP:  [9 mmHg-15 mmHg] 9 mmHg  VENTILATOR SETTINGS: FiO2 (%):  [100 %] 100 %  INTAKE / OUTPUT: I/O last 3 completed shifts: In: 1720 [P.O.:870; I.V.:250; IV Piggyback:600] Out: 6384 [Urine:4275]  PHYSICAL EXAMINATION: General:  Adult female, agitated on vent. Neuro:  Agitated on vent.  Follows commands. HEENT:  [place] /  AT.  Dark blood noted in ET tube. Cardiovascular:  RRR, no M/R/G. Lungs:  Significant coarse crackles bilaterally. Abdomen:  BS x 4, S/NT/ND. Musculoskeletal:  No deformities, no edema. Skin:  Warm, moist.  LABS:  BMET  Recent Labs Lab 03/07/2017 0515 02/25/17 0500 02/25/17 1526  NA 131* 131* 133*  K 4.2 2.9* 2.6*  CL 82* 78* 83*  CO2 41* 43* 36*  BUN 28* 30* 35*  CREATININE 0.97 1.13* 1.52*  GLUCOSE 109* 111* 161*    Electrolytes  Recent Labs Lab 02/19/17 0530 02/20/17 0523  03/04/2017 0515 02/25/17 0500 02/25/17 1526  CALCIUM 8.2* 8.3*  < > 8.3* 8.8* 8.5*  MG 1.7 2.0  --   --  2.0  --   < > = values in this interval not displayed.  CBC  Recent Labs Lab 02/23/17 0545 03/19/2017 0445 02/25/17 0500  WBC 18.6* 19.9* 17.7*  HGB 14.3 13.5 13.4  HCT 42.3 39.5 39.8  PLT 305 448* 394    Coag's  Recent Labs Lab 03/21/2017 0445  INR 1.15    Sepsis Markers  Recent Labs Lab 02/19/17 0530  PROCALCITON 1.18    ABG No results for input(s): PHART, PCO2ART, PO2ART in the last 168 hours.  Liver Enzymes  Recent Labs Lab 02/25/17 0500  AST 76*  ALT 98*  ALKPHOS 196*  BILITOT 0.9  ALBUMIN 2.2*  Cardiac Enzymes No results for input(s): TROPONINI, PROBNP in the last 168 hours.  Glucose No results for input(s): GLUCAP in the last 168 hours.  Imaging No results found.    STUDIES:  ECHO 3/27 >  EF 50-55%, PA pressure 70 mmHg, biatrial enlargement CXR 3/26 > cardiomegaly, chronicfiibrotic changes on b/l bases, mild interstitial edema Chest CT 4/3 > b/l pulm infiltrates, b/l pleural effusions L > R.  Increased mediastinal LAD.  CULTURES: 4/2 bcx > NGTD  ANTIBIOTICS: Azithromycin 4/2 > 4/5. ceftrx 4/2 > 4/5 Vanc 4/5 >  Zosyn 4/5 >  SIGNIFICANT EVENTS: 3/26  Admit with cough, hemoptysis, pulmonary edema in setting of AF w RVR 3/27  Multiple bolus of cardizem, Cardiology following 3/28  PCCM consulted for increased O2 needs, SOB. Signed  off 3/29  diuresed well, felt slightly better, HR improved 3/30 continued to diurese well with breathing better 3/31 continued to be hypoxic requiring high flow, continued to diurese well 4/2 started on abx for leukocytosis to cover CAP 4/3 lasix increased and metolazone added 4/5 cardiac arrest  LINES/TUBES: ETT 4/5 >   DISCUSSION: 66 y.o. F admitted 3/26 with cough and hemoptysis, found to have AFRVR, pulmonary edema, volume overload.  Has been getting high dose lasix and diuresing well but still volume overloaded. 4/5, suffered V.fib arrest (presumed due to hypokalemia after aggressive diuresis) > intubated and PCCM called back.  ASSESSMENT / PLAN:  PULMONARY A: Acute Hypoxic Respiratory Failure - suspect multifactorial in the setting of underlying fibrosis, pulmonary edema in the setting of AFwRVR and significant right heart failure, hemoptysis. Hemoptysis - unclear etiology as she has been off anticoagulation, small volume, ? If related to cough / airway irritation. 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 Tobacco Abuse. Pulmonary HTN - PA peak 70 on ECHO  P:   Full vent support. Assess ABG, adjust vent accordingly. Continue high dose lasix + metolazone per cardiology (goal CVP < 10). Needs bronch - day team to assess. Continue BD's. Pulmonary hygiene. Cards planning for R/L heart cath once volume status optimized (was initially planned for 4/3 but cancelled due to hypoxia). Follow CXR.  CARDIOVASCULAR A:  V.fib arrest - presumed due to hypokalemia after aggressive diuresis.  s/p defib x 4 with only brief response before recurrent V.fib;  Only broke after amio given. Atrial Fibrillation with RVR. Chronic Diastolic CHF - Echo from 7/61/60 with EF 50-55%, biatrial enlargement, Mod TR, PAP 70. HTN, HLD. P:  Continue amio for now (despite hx of possible IPF due to [facility]). Cardiology following, appreciate input.  Cardiology to please  weigh in on additional rhythm control agent so can d/c amio given her hx. Continue aggressive diuresis per cards (furosemide + metolazone), goal CVP < 10. Continue digoxin, pravastatin. D/c dilt, metoprolol. Trend troponin.  RENAL A:   CKD III. Hypokalemia - exacerbated by aggressive diuresis. Hyponatremia  Contraction alkalosis. P:   Continue aggressive diuresis per cards. Replace electrolytes as indicated. 66mq K IV now and additional 829m per tube. Assess BMP q4hrs.  GASTROINTESTINAL A:   Hx AVM of Small Bowel. Nutrition. P:   PPI. NPO.  HEMATOLOGIC A:   Hx Macrocytic Anemia. VTE prophylaxis. P:  SCD's. Assess CBC now.  INFECTIOUS A:   Concern for HCAP - has had prolonged hospitalization. P:   Broaden from ceftriaxone / azithro to vanc / zosyn. Assess PCT - if low, consider d/c empiric abx.  ENDOCRINE A:   Borderline DM.  P:   Monitor glucose on BMP.  NEUROLOGIC A: Acute encephalopathy - due to sedation.   Hx Schizophrenia, Anxiety / Depression. P:   Sedation: Fentanyl gtt / Midazolam PRN. RASS goal: 0 to -1. Daily WUA. Hold preadmission percocet.   FAMILY  - Updates: Daughter Butch Penny updated over the phone.  - Inter-disciplinary family meet or Palliative Care meeting due by:  03/04/17.   CC time: 45 min.   Montey Hora, [place] Pager: [phone]  or [phone] 02/26/2017, 4:20 AM    Attending Note:  I have examined patient, reviewed labs, studies and notes. I have discussed the case with [place], and I agree with the data and plans as amended above.   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 She has been in the hospital for over 10 days, admitted initially with dyspnea and cough productive of frothy  blood-tinged sputum. This was in the setting of atrial fibrillation with rapid ventricular response and increased bilateral interstitial infiltrates consistent with diastolic CHF. She was aggressively diuresed, rate controlled with diltiazem. Her dyspnea and hemoptysis improved. Echocardiogram performed this admission confirmed severe pulmonary hypertension in the setting of intact LV function. Autoimmune labs consistent for elevation in RF, CRP, CCP. Early morning/5 the patient experienced ventricular fibrillation and cardiac arrest. She was immediately defibrillated and CPR initiated. She was intubated. She was in and out of V. fib and pulselessness for approximally 18 minutes. Stable rhythm was reobtained after amiodarone was given. Post intubation she has been noted to have recurrent blood-tinged secretions.   Vitals:   02/26/17 0330 02/26/17 0357 02/26/17 0400 02/26/17 0500  BP: 110/64 110/64 (!) 136/56 (!) 132/59  Pulse:  78    Resp: 20 (!) 23 (!) 23 20  Temp:      TempSrc:      SpO2: 99% 100% 99% 96%  Weight:    93.4 kg (205 lb 14.6 oz)  Height:       On exam she is intubated, sedated, chronically ill-appearing. She will wake to voice and touch, does open her eyes and tracks. Moves purposefully but did not follow commands. Endotracheal tube is in good position. There is blood-tinged mucus in her suction tubing and canister. There is no blood from her nose or oropharynx. Her lungs are coarse bilaterally. There is no wheezing. Heart is irregularly irregular, 90s. Abdomen benign. Trace bilateral pretibial edema. I do not see any telangiectasias although her oral mucosa was not inspected.  Acute on chronic hypoxemic respiratory failure in the setting of bilateral pulmonary infiltrates, pulmonary hypertension. Suspect large contribution has been her atrial fibrillation and diastolic dysfunction with volume overload. She has been aggressively diuresed. She cares a possible diagnosis of amiodarone  toxicity, note also positive serologies for possible rheumatoid disease. Now clearly intubated in the setting of her cardiac arrest. We'll need to discuss with cardiology medical regimen for her rhythm control-amiodarone toxicity suspected, probably a poor choice for her if an alternative is possible. Currently being diuresed aggressively. We'll need to balance this with her electrolyte abnormalities. Agree with treating her for suspected HCAP, abx changed. Clearly several issues that need to be addressed and stabilized before we would consider spontaneous breathing and extubation  Ventricular Fibrillation, arrest. Question due to hypokalemia, replacing. Follow BMP. As above amiodarone started. Hopefully  an alternative will be possible given suspected amiodarone toxicity.  Hemoptysis. Had been small amounts and pink sputum suspected related to pulmonary edema. Post intubation a larger volume evident. Etiology unclear, consider new pneumonia. Consider AVMs and pulmonary hypertension. Consider traumatic intubation or airway injury. Now that she is intubated I think we should proceed with inspection bronchoscopy essentially she has stable hemodynamics and rhythm.   Severe PAH. Multiple possible contributors, including diastolic function and left-sided heart disease. Also consider possible rheumatoid, history of interstitial lung disease, AVMs. Much of this workup will need to take place when she stable from her acute issues. Note that her right and left heart cath are hopefully to take place. Rate control and volume management actively now.  Acute on chronic renal failure. Has been aggressively diuresed. We'll need to modulate this against her current renal status.    Independent critical care time is 45 minutes.   Baltazar Apo, MD, PhD 02/26/2017, 5:58 AM [facility] [phone] or if no answer [phone]

## 2017-02-26 NOTE — Progress Notes (Signed)
eLink Physician-Brief Progress Note Patient Name: [name] DOB: [birth year] MRN: [id number]   Date of Service  02/26/2017  HPI/Events of Note  On ELINK rounds patient noted to be in VF on monitor.  Camera check showed nursing staff at bedside initiating CPR.  Given 1 amp of epi.  Remained in VF.  Shocked x 1 with ROSC but deteriorated again into fine VF.  Epi and shock again with not change in rhythm.  Subsequent administration of Bicarb and amio along with shocking with ROSC.  Is on amio gtt now but h/o of toxicity.  Cards involved in care with h/o of AF/RVR.  Was breathing spontaneously prior to paralytic in order to intubate.  eICU Interventions  Plan; PCCM to re-consult Anticipate transfer to [facility] for hypothemia protocol Vent orders Sedation held for now - paralyzed for intubation - consider fentanyl Cards consult re use of amio in the setting of amio toxicity F/U labs/PCXR  Insert aline for HD monitoring     Intervention Category Major Interventions: Code management / supervision  DETERDING,ELIZABETH 02/26/2017, 3:10 AM

## 2017-02-26 NOTE — Progress Notes (Signed)
Date:  February 26, 2017 Chart reviewed for concurrent status and case management needs. Will continue to follow patient progress.[name] on vent support Discharge Planning: following for needs Expected discharge date: [id number] Marcelle Smiling, BSN, [place], [place]   [phone]

## 2017-02-26 NOTE — Code Documentation (Signed)
Called to room with CODE BLUE in progress.  Staff report pt went into VT.  CPR initiated immediately, pt shocked x1.  CPR performed approximately 3 minutes with return of spontaneous circulation.  Patient awake/alert and appropriate post arrest.  Remains on vent, getting full volumes.    Plan: Re-bolus with 150 mg IV amiodarone  Assess CMP, CBC, Mg  2gm Mg now  Dr. Anne Fu notified of arrest Assess CXR post CPR D/C pepcid & zofran as does have low risk QT prolongation Discontinue vanco with rising sr cr, neg MRSA PCR  Family updated in conference room.     Canary Brim, NP-C [facility] Pgr: [phone] or if no answer [phone] 02/26/2017, 12:51 PM

## 2017-02-26 NOTE — Progress Notes (Signed)
Per CCM hold Arterial line insertion at this time.  RT to monitor and assess as needed.

## 2017-02-26 NOTE — Progress Notes (Signed)
Rt help MD with bedside bronch. All equipment intact pre and post bronch.

## 2017-02-26 NOTE — Procedures (Addendum)
Bronchoscopy Procedure Note [name] [id number] Jul 09, 1951  Procedure: Bronchoscopy Indications: Diagnostic evaluation of the airways  Procedure Details Consent: Risks of procedure as well as the alternatives and risks of each were explained to the (patient/caregiver).  Consent for procedure obtained. Time Out: Verified patient identification, verified procedure, site/side was marked, verified correct patient position, special equipment/implants available, medications/allergies/relevent history reviewed, required imaging and test results available.  Performed  In preparation for procedure, patient was given 100% FiO2 and bronchoscope lubricated. Sedation: Benzodiazepines versed 2 mg fent 100 mcg  Airway entered and the following bronchi were examined: Bronchi.  Thick blood clots aspirated from rt airways - No blood seen in distal airways. Scope entered from nostril & mouth - no upper airway source of bleeding noted. Procedures performed: BAL performed RLL Bronchoscope removed.  , Patient placed back on 100% FiO2 at conclusion of procedure.    Evaluation Hemodynamic Status: BP stable throughout; O2 sats: stable throughout Patient's Current Condition: stable Specimens:  Sent serosanguinous fluid Complications: No apparent complications Patient did tolerate procedure well.   [name],Joy V. 02/26/2017

## 2017-02-26 NOTE — Progress Notes (Signed)
CRITICAL VALUE ALERT  Critical value received:  Lactic acid 10  Date of notification: 02/26/17  Time of notification:  0356  Critical value read back:Yes.    Nurse who received alert:  Josephina Shih, RN  MD notified (1st page):  Rutherford Guys, PA, Bedside RN, Sam also made aware.   Time of first page: notified face to face.

## 2017-02-26 NOTE — Progress Notes (Signed)
   02/26/17 1200  Clinical Encounter Type  Visited With Patient and family together  Visit Type Initial;Psychological support;Spiritual support;Critical Care;Code  Referral From Nurse  Consult/Referral To Chaplain  Spiritual Encounters  Spiritual Needs Emotional;Other (Comment) (Pastoral Conversation/Support)  Stress Factors  Patient Stress Factors Health changes  Family Stress Factors Health changes   Responded to a page from the nurse, because the patient had coded. I visited with the family in the waiting room and provided a prayer shawl for the patient. The family stated that the patient is extremely religious, Jehovah's Witness/Baptist, and that prayer is important. At this time, they are worried about the patient's health status. One of the family members, grandson, was more anxious than the rest of the family. This is something for the team to be aware of.   Please, contact Spiritual Care for further assistance.   Chaplain Clint Bolder M.Div.

## 2017-02-26 NOTE — ED Provider Notes (Signed)
I was called to the patient's room for cardiac arrest. The cardiac arrest was being managed by Dr. Lanora Manis Deterding via E-Link. Dr. Darrick Penna gave all orders for medications but I observed the monitor and ordered defibrillation when the patient went into recurrent ventricular fibrillation. Between defibrillations the patient had a palpable pulse. After consultation with Dr. Darrick Penna,  the patient was bolused with 300 milligrams of amiodarone and her rhythm stabilized. In the meantime she was intubated by the CRNA present who administered succinylcholine but no sedation. Preparations were then made to transfer the patient to [facility].       Paula Libra, MD 02/26/17 [phone]

## 2017-02-26 NOTE — Progress Notes (Signed)
eLink Physician-Brief Progress Note Patient Name: [name] DOB: 1951/06/20 MRN: [id number]   Date of Service  02/26/2017  HPI/Events of Note  10 Beat run of NSVT - Currently on an Amiodarone IV infusion. Now NSR with Rate = 78 with frequent PVC's.  eICU Interventions  Will order: 1. BMP and Mg++ level now. 2. Lidocaine 100 mg IV bolus and IV infusion at 2 mg/min.      Intervention Category Major Interventions: Arrhythmia - evaluation and management  Orlanda Frankum Eugene 02/26/2017, 7:58 PM

## 2017-02-26 NOTE — Progress Notes (Addendum)
Initial Nutrition Assessment  DOCUMENTATION CODES:   Obesity unspecified  INTERVENTION:  - Continue order per PCCM MD: Vital High Protein @ 20 mL/hr which provides 480 kcal, 42 grams of protein, and 401 mL free water.  - Tomorrow will order Vital High Protein @ 50 mL/hr with 30 mL Prostat once/day. This regimen will provide 1300 kcal, 120 grams of protein, and 1003 mL free water. - Will initiate PEPuP protocol.   NUTRITION DIAGNOSIS:   Inadequate oral intake related to inability to eat as evidenced by NPO status.  GOAL:   Provide needs based on ASPEN/SCCM guidelines  MONITOR:   Vent status, TF tolerance, Weight trends, Labs, I & O's  REASON FOR ASSESSMENT:   Ventilator, Consult Enteral/tube feeding initiation and management  ASSESSMENT:   66 y.o. female with pulmonary fibrosis (amio-related?), severe pulmonary HTN, diastolic heart failure with acute exacerbation, persistent atrial fibrillation with ongoing mild hemoptysis, HTN, HLP, mild DM, anemia and history of AVMs. Plan is for right and left heart catheterization after adequate diuresis  BMI indicates obesity. Pt was a Code Blue overnight following cardiac arrest. She is currently intubated with OGT in place. No family/visitors present at this time. Bronchoscopy performed earlier this AM. Weight has mainly been stable since admission (203-208 lbs on average). Goal is for diuresis prior to heart caths; R cath postponed on 4/3 d/t desired for increased diuresis.   Physical assessment shows no muscle or fat wasting; mild edema throughout upper and lower body. Per chart review, weight has been stable x1 year. Spoke with Dr. Vassie Loll and plan for today is for TF @ 20 mL/hr d/t repeated cardiac events; likely able to advance to goal rate tomorrow.   Patient is currently intubated on ventilator support MV: 9.4 L/min Temp (24hrs), Avg:97.8 F (36.6 C), Min:97.3 F (36.3 C), Max:98.2 F (36.8 C) Propofol: none  Medications  reviewed; 20 gm IV Pepcid BID, 80 mg IV Lasix x1 dose today, sliding scale Novolog, 80 mg IV Solu-medrol  QID, 40 mg IV Protonix/day, 40 mEq KCl per OGT BID, 80 mEq KCl per OGT x1 dose today. Labs reviewed; Na: 131 mmol/L, Cl: 80 mmol/L, BUN: 43 mg/dL, creatinine: 1.9 mg/dL, Ca: 8 mg/dL, Mg: 2.5 mg/dL, K: was 2.7 mmol/L at 2563 now up to 4.5 mmol/L following runs of K.  Drips: Fentanyl @ 75 mcg/hr, Amiodarone @ 60 mg/hr.    Diet Order:  Diet NPO time specified  Skin:  Reviewed, no issues  Last BM:  4/3  Height:   Ht Readings from Last 1 Encounters:  02/26/17 5\' 6"  (1.676 m)    Weight:   Wt Readings from Last 1 Encounters:  02/26/17 205 lb 14.6 oz (93.4 kg)    Ideal Body Weight:  59.09 kg  BMI:  Body mass index is 33.23 kg/m.  Estimated Nutritional Needs:   Kcal:  1027-1308 (11-14 kcal/kg)  Protein:  118 grams (2 grams/kg IBW)  Fluid:  1.2-1.5 L/day  EDUCATION NEEDS:   No education needs identified at this time    04/28/17, MS, RD, LDN, CNSC Inpatient Clinical Dietitian Pager # [phone] After hours/weekend pager # [phone]

## 2017-02-26 NOTE — Progress Notes (Signed)
Pt was intubated by Enriqueta Shutter, CRNA using [facility] blade.  7.5 ett secured at 24cm lips.  Positive color change noted, with etco2 reading of 50.  CRNA retrieved some gum from the airway with magill forceps prior to intubation.  Pt was intubated without difficulty.  RT assisted at bedside.  Pt placed on vent per MD orders.

## 2017-02-26 NOTE — Progress Notes (Signed)
Pt went into vfib. Code called. See code sheet for details. Pt now intubated and is resting comfortably. Will continue to monitor patient.

## 2017-02-26 NOTE — Progress Notes (Addendum)
PULMONARY / CRITICAL CARE MEDICINE   Name: [name] MRN: [id number] DOB: Jun 05, 1951    ADMISSION DATE:  02/08/2017 CONSULTATION DATE:  02/26/17  REFERRING MD:  Dr. 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 Cough x2 months with frothy sputum which became blood tinged prior to admission, with some leg edema and orthopnea. She developed Afib with RVR, CXR showed interstitial edema along with 8 lb weight increase suggestive of acute DCHF. Was put on lasix, dilt gtt and digoxin. She could not tolerate bipap, we were consulted due to hemoptysis and hypoxic resp failure.   Signed off 3/28 with plans for outpatient follow up.  Early AM 4/5, pt had V.fib arrest requiring defib x 4, epi, bicarb.  V.fib would break momentarily after defib but then recur immediately. It was not until amio was started did V.fib break fully.  She was intubated by anesthesia and PCCM to assume care. Of note, following intubation, she had frank hemoptysis again, dark colored.  Autoimmune labs from 3/28 as follows: ESR neg, Anti Scl-70 neg, CRP elevated 19.1, dsDNA neg, CCP antibodies elevated at 36, ANCA neg, GBM Ab neg,  RF elevated at 15.7   SUBJECTIVE:    Critically ill, intubated, on fentanyl drip with mild breakthrough agitation Frank blood being suctioned out of the ET tube  VITAL SIGNS: BP 106/81   Pulse 78   Temp 97.8 F (36.6 C) (Oral)   Resp (!) 26   Ht '5\' 6"'$  (1.676 m)   Wt 205 lb 14.6 oz (93.4 kg)   SpO2 99%   BMI 33.23 kg/m   HEMODYNAMICS: CVP:  [9 mmHg] 9 mmHg  VENTILATOR SETTINGS: Vent Mode: PRVC FiO2 (%):  [50 %-100 %] 50 % Set Rate:  [20 bmp] 20 bmp Vt Set:  [480 mL] 480 mL PEEP:  [5 cmH20] 5 cmH20 Plateau Pressure:  [20 cmH20-27 cmH20] 23 cmH20  INTAKE /  OUTPUT: I/O last 3 completed shifts: In: 1463.2 [P.O.:250; I.V.:363.2; IV Piggyback:850] Out: 2875 [Urine:2875]  PHYSICAL EXAMINATION: General:  Adult female, agitated on vent. Neuro:  rASS-2  Follows commands when sedation weaned HEENT:  [place] / AT.  Dark blood noted in ET tube. Cardiovascular:  RRR, no M/R/G. Lungs:  Significant coarse crackles bilaterally. Abdomen:  BS x 4, S/NT/ND. Musculoskeletal:  No deformities, no edema. Skin:  Warm, moist.  LABS:  BMET  Recent Labs Lab 02/25/17 0500 02/25/17 1526 02/26/17 0315  NA 131* 133* 135  K 2.9* 2.6* 2.7*  CL 78* 83* 75*  CO2 43* 36* 41*  BUN 30* 35* 39*  CREATININE 1.13* 1.52* 1.65*  GLUCOSE 111* 161* 224*    Electrolytes  Recent Labs Lab 02/20/17 0523  02/25/17 0500 02/25/17 1526 02/26/17 0315  CALCIUM 8.3*  < > 8.8* 8.5* 8.7*  MG 2.0  --  2.0  --  2.5*  PHOS  --   --   --   --  4.6  < > = values in this interval not displayed.  CBC  Recent Labs Lab 02/22/2017 0445 02/25/17 0500 02/26/17 0315  WBC 19.9* 17.7* 14.7*  HGB 13.5 13.4 13.6  HCT 39.5 39.8 41.2  PLT 448* 394 382    Coag's  Recent Labs Lab 03/15/2017 0445  INR 1.15  Sepsis Markers  Recent Labs Lab 02/26/17 0315 02/26/17 0318 02/26/17 0700  LATICACIDVEN  --  10.0* 5.2*  PROCALCITON 0.45  --   --     ABG  Recent Labs Lab 02/26/17 0300  PHART 7.403  PCO2ART 63.5*  PO2ART 69.3*    Liver Enzymes  Recent Labs Lab 02/25/17 0500  AST 76*  ALT 98*  ALKPHOS 196*  BILITOT 0.9  ALBUMIN 2.2*    Cardiac Enzymes  Recent Labs Lab 02/26/17 0315  TROPONINI 0.04*    Glucose No results for input(s): GLUCAP in the last 168 hours.  Imaging Dg Abd 1 View  Result Date: 02/26/2017 CLINICAL DATA:  OG tube placement EXAM: ABDOMEN - 1 VIEW COMPARISON:  None. FINDINGS: The enteric tube extends well into the stomach with tip in the region of the distal gastric antrum. IMPRESSION: OG tube extends well into the stomach.  Electronically Signed   By: Andreas Newport M.D.   On: 02/26/2017 04:42   Portable Chest Xray  Result Date: 02/26/2017 CLINICAL DATA:  66 y/o  F; post intubation. EXAM: PORTABLE CHEST 1 VIEW COMPARISON:  02/23/2017 chest radiograph. FINDINGS: Right PICC line tip projects over mid SVC. Transcutaneous pacing pads noted. Endotracheal tube is 6.3 cm from carina projecting between the bilateral clavicles. Diffuse airspace opacities are increased from the prior radiographs and may represent edema and/or pneumonia. No acute osseous abnormality is evident. IMPRESSION: Endotracheal tube 6.3 cm from carina. Increase in diffuse airspace opacities which may represent edema and/or pneumonia. Electronically Signed   By: Kristine Garbe M.D.   On: 02/26/2017 03:41      STUDIES:  ECHO 3/27 >  EF 50-55%, PA pressure 70 mmHg, biatrial enlargement CXR 3/26 > cardiomegaly, chronicfiibrotic changes on b/l bases, mild interstitial edema Chest CT 4/3 > b/l pulm infiltrates, b/l pleural effusions L > R.  Increased mediastinal LAD.  CULTURES: 4/2 bcx > NGTD  ANTIBIOTICS: Azithromycin 4/2 > 4/5. ceftrx 4/2 > 4/5 Vanc 4/5 >  Zosyn 4/5 >  SIGNIFICANT EVENTS: 3/26  Admit with cough, hemoptysis, pulmonary edema in setting of AF w RVR 3/27  Multiple bolus of cardizem, Cardiology following 3/28  PCCM consulted for increased O2 needs, SOB. Signed off 3/29  diuresed well, felt slightly better, HR improved 3/30 continued to diurese well with breathing better 3/31 continued to be hypoxic requiring high flow, continued to diurese well 4/2 started on abx for leukocytosis to cover CAP 4/3 lasix increased and metolazone added 4/5 cardiac arrest  LINES/TUBES: ETT 4/5 >   DISCUSSION: 66 y.o. F with ILD dating back to 2014 , labelled as amio toxicity ,severe pulm htn admitted 3/26 with cough and hemoptysis, BL infiltrates , found to have AFRVR, pulmonary edema, volume overload.  Has been getting high dose lasix  and diuresing well but still volume overloaded. 4/5, suffered V.fib arrest (presumed due to hypokalemia after aggressive diuresis) > intubated and PCCM called back.  ASSESSMENT / PLAN:  PULMONARY A: Acute Hypoxic Respiratory Failure - suspect multifactorial in the setting of underlying fibrosis, pulmonary edema in the setting of AFwRVR and significant right heart failure, hemoptysis. 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 Tobacco Abuse. Pulmonary HTN - PA peak 70 on ECHO  P:   Full vent support-ventilator settings were reviewed and  adjusted Continue BD's. Empiric solumedrol 80 q 8    CARDIOVASCULAR A:  V.fib arrest - presumed due to hypokalemia after aggressive diuresis.  s/p defib x 4 with only brief response before recurrent V.fib;  Only broke after amio given. Atrial Fibrillation with RVR. Chronic Diastolic CHF - Echo from 7/42/59 with EF 50-55%, biatrial enlargement, Mod TR, PAP 70. HTN, HLD. P:  Continue amio for now (despite hx of possible IPF due to [facility]). Cardiology following -discussed above Hold diuresis x 1 day until K corrcted (furosemide + metolazone), then can resume  Continue digoxin, pravastatin. D/c dilt, metoprolol. Cards planning for R/L heart cath once volume status optimized (was initially planned for 4/3 but cancelled due to hypoxia).  RENAL A:   AKI on CKD III. Hypokalemia - exacerbated by aggressive diuresis. Hyponatremia  Contraction alkalosis. P:   Replace electrolytes aggressively- goal K >4 & Mg > 2.0 20mq K IV now and additional 860m per tube. Assess BMP q4hrs. Hold diuretics  GASTROINTESTINAL A:   Hx AVM of Small Bowel. Nutrition. P:   PPI. NPO. Start TFs  HEMATOLOGIC A:   Hx Macrocytic Anemia. VTE  prophylaxis. P:  SCD's. Assess CBC now.  INFECTIOUS A:   Concern for HCAP - has had prolonged hospitalization. P:   Broaden from ceftriaxone / azithro to vanc / zosyn. Assess PCT - if remains low, consider d/c empiric abx.  ENDOCRINE A:   Borderline DM.    P:   Monitor glucose on BMP.  NEUROLOGIC A: Acute encephalopathy - due to sedation.   Hx Schizophrenia, Anxiety / Depression. P:   Sedation: Fentanyl gtt / Midazolam PRN. RASS goal: 0 to -1. Daily WUA. Hold preadmission percocet.   FAMILY  - Updates: Daughter DoButch Pennypdated  4/67/5- Inter-disciplinary family meet or Palliative Care meeting due by:  03/04/17.   CC time: 40 min.   RaKara MeadD. FCShade [facility] Pager [phone] no response call [phone]    02/26/2017, 9:39 AM

## 2017-02-26 NOTE — Progress Notes (Signed)
Progress Note  Patient Name: [name] Date of Encounter: 02/26/2017  Primary Cardiologist: Hilty  Subjective   Intubated- opens her eyes to comand  Inpatient Medications    Scheduled Meds: . [START ON 02/27/2017] ceFEPime (MAXIPIME) IV  2 g Intravenous Q24H  . chlorhexidine gluconate (MEDLINE KIT)  15 mL Mouth Rinse BID  . Chlorhexidine Gluconate Cloth  6 each Topical Daily  . famotidine (PEPCID) IV  20 mg Intravenous Q12H  . feeding supplement (VITAL HIGH PROTEIN)  1,000 mL Per Tube Q24H  . insulin aspart  0-20 Units Subcutaneous Q4H  . mouth rinse  15 mL Mouth Rinse QID  . methylPREDNISolone (SOLU-MEDROL) injection  80 mg Intravenous Q6H  . nicotine  7 mg Transdermal QHS  . pantoprazole (PROTONIX) IV  40 mg Intravenous Q24H  . potassium chloride  40 mEq Per Tube BID  . pravastatin  20 mg Per Tube Daily  . sodium chloride flush  10-40 mL Intracatheter Q12H  . sodium chloride flush  3 mL Intravenous Q12H  . sodium chloride flush  3 mL Intravenous Q12H  . vancomycin  1,250 mg Intravenous Q24H   Continuous Infusions: . sodium chloride 10 mL/hr at 02/25/17 0900  . amiodarone 30 mg/hr (02/26/17 1015)  . fentaNYL infusion INTRAVENOUS 75 mcg/hr (02/26/17 0520)   PRN Meds: sodium chloride, bisacodyl, fentaNYL, levalbuterol, midazolam, midazolam, ondansetron **OR** ondansetron (ZOFRAN) IV, sodium chloride, sodium chloride flush, sodium chloride flush   Vital Signs    Vitals:   02/26/17 0400 02/26/17 0500 02/26/17 0600 02/26/17 0700  BP: (!) 136/56 (!) 132/59 106/81   Pulse:      Resp: (!) 23 20 (!) 26   Temp:    97.8 F (36.6 C)  TempSrc:    Oral  SpO2: 99% 96% 99%   Weight:  205 lb 14.6 oz (93.4 kg)    Height:        Intake/Output Summary (Last 24 hours) at 02/26/17 1059 Last data filed at 02/26/17 0605  Gross per 24 hour  Intake          1073.15 ml  Output             1050 ml  Net            23.15 ml   Filed Weights   03/19/2017 0438 02/25/17 0500  02/26/17 0500  Weight: 204 lb 9.4 oz (92.8 kg) 203 lb 7.8 oz (92.3 kg) 205 lb 14.6 oz (93.4 kg)    Telemetry    CHB with narrow complex escape-60-70 - Personally Reviewed  Physical Exam   GEN: Obese AA female- intubated Cardiac: RRR, no murmurs, rubs, or gallops. Clubbing of the fingers noted Respiratory:decreased breath sounds, intubated NO:BSJGG, soft, nontender, non-distended  MS: Trace-1+ lower extremity edema; No deformity. Neuro: lethargic but responds    Labs    Chemistry  Recent Labs Lab 02/25/17 0500 02/25/17 1526 02/26/17 0315 02/26/17 0900  NA 131* 133* 135 131*  K 2.9* 2.6* 2.7* 4.5  CL 78* 83* 75* 80*  CO2 43* 36* 41* 35*  GLUCOSE 111* 161* 224* 273*  BUN 30* 35* 39* 43*  CREATININE 1.13* 1.52* 1.65* 1.90*  CALCIUM 8.8* 8.5* 8.7* 8.0*  PROT 7.8  --   --   --   ALBUMIN 2.2*  --   --   --   AST 76*  --   --   --   ALT 98*  --   --   --   ALKPHOS  196*  --   --   --   BILITOT 0.9  --   --   --   GFRNONAA 50* 35* 31* 26*  GFRAA 57* 40* 36* 31*  ANIONGAP 10 14 19* 16*     Hematology  Recent Labs Lab 03/13/2017 0445 02/25/17 0500 02/26/17 0315  WBC 19.9* 17.7* 14.7*  RBC 5.03 4.96 5.04  HGB 13.5 13.4 13.6  HCT 39.5 39.8 41.2  MCV 78.5 80.2 81.7  MCH 26.8 27.0 27.0  MCHC 34.2 33.7 33.0  RDW 15.0 15.1 15.0  PLT 448* 394 382    Cardiac Enzymes  Recent Labs Lab 02/26/17 0315 02/26/17 0900  TROPONINI 0.04* 0.14*   No results for input(s): TROPIPOC in the last 168 hours.   BNP  Recent Labs Lab 02/21/17 0930  BNP 759.3*     Cardiac Studies   ECHO 02/17/17  - Left ventricle: The cavity size was normal. Wall thickness was normal. Systolic function was normal. The estimated ejection fraction was in the range of 50% to 55%. Wall motion was normal; there were no regional wall motion abnormalities. - Ventricular septum: The contour showed diastolic flattening and systolic flattening. - Left atrium: The atrium was moderately  dilated. - Right ventricle: The cavity size was mildly dilated. Systolic function was moderately reduced. - Right atrium: The atrium was moderately dilated. - Tricuspid valve: There was moderate regurgitation. - Pulmonary arteries: Systolic pressure was severely increased. PA peak pressure: 70 mm Hg (S). - Pericardium, extracardiac: A small pericardial effusion was identified.  Impressions:  - Technically difficult; normal LV systolic function; D shaped septum suggestive of pulmonary hypertension; biatrial enlargement; mild RVE with moderately reduced RV function; moderate TR with severely elevated pulmonary pressure; small pericardial effusion.  Patient Profile     66 y.o. female with pulmonary fibrosis (amio-related?), severe pulmonary HTN, diastolic heart failure with acute exacerbation, persistent atrial fibrillation with RVR,  ongoing mild hemoptysis, HTN, HLP, mild DM, anemia and history of AVMs. Pt admitted for above and VF arrest early 4/5 am- shocked x 4, now in CHB on Amiodarone.   Assessment & Plan    1. VF arrest - shocked x 4. Apparently uncontrolled VF till Amiodarone started  2. CHB - pt now in CHB  With VR 60-70, narrow complex QRS  3.  Acute respiratory failure/hypoxia -due to CHF and pulmonary fibrosis. Now intubated s/p cardiac arrest  4. CHF (HFpEF)  -acute exacerbation with biventricular manifestations. - I/O negative 11.7L since adm- wgt unchanged. -Plan was for R&L heart cath after further diuresis and improved oxygenation  5.  PAH  -severe, difficult to know to what degree it is due to pulmonary fibrosis versus left heart failure. Will need a heart cath after diuresis. We may never get her CVP down, as she appears to have substantial RHF.  6.  Afib  -CHA2DS2/VAS Stroke Risk Score 4 (HTN, CHF, age, female) Unable to anticoagulate with ongoing hemoptysis (and hx of anemia due to GI bleeding and pt refuses).  7. H/O Pulmonary fibrosis    -presumed to be amio related  Plan- Will review with MD-K+ was 2.7 at time of her arrest, now 4.5.    Signed, Kerin Ransom, PA-C  02/26/2017, 10:59 AM    Personally seen and examined. Agree with above.  66 year old with severe pulmonary hypertension likely WHO classification 2 and 3 mixed who suffered ventricular fibrillatory arrest in the setting of hypokalemia and substantial right heart failure with right atrial pressures of 15.  Ventricular fibrillatory arrest  - Agree with reinstitution of IV amiodarone with IV boluses of amiodarone as needed.  - Aggressive repletion of potassium, now normal  - IV magnesium administered.  - Prolonged QT precautions  - If ventricular fibrillation once again occurs, ACLS protocol with DC cardioversion obviously, rebolus IV amiodarone. Could consider IV lidocaine if necessary (i.e. blood pressure does not tolerate amiodarone).   - She had a subsequent ventricular fibrillation cardiac arrest despite repleted potassium. Her prior ejection fraction left ventricular ejection fraction was 55% however her right ventricle showed moderately reduced function and evidence of volume and pressure overload consistent with pulmonary hypertension.  - I think the risk versus benefit ratio of utilizing IV amiodarone at this time suggest that we continue with the IV amiodarone to stabilize her electrical activity. Her pulmonary fibrotic process was beginning before initiation of amiodarone, possible rheumatoid arthritis.  I spoke with her son. Demonstrated the gravity of the situation. She has a high baseline mortality based upon her RV failure, elevated right atrial pressures and now this has increased her medically given her arrhythmias and cardiac arrests.  Critical care time 40 minutes spent with patient, discussion with son, discussion with care team/CCM in the setting of multisystem organ failure, careful data review.  Candee Furbish, MD

## 2017-02-27 ENCOUNTER — Inpatient Hospital Stay (HOSPITAL_COMMUNITY): Payer: Medicare Other

## 2017-02-27 DIAGNOSIS — N179 Acute kidney failure, unspecified: Secondary | ICD-10-CM

## 2017-02-27 DIAGNOSIS — I469 Cardiac arrest, cause unspecified: Secondary | ICD-10-CM

## 2017-02-27 DIAGNOSIS — I4721 Torsades de pointes: Secondary | ICD-10-CM

## 2017-02-27 DIAGNOSIS — I472 Ventricular tachycardia: Secondary | ICD-10-CM

## 2017-02-27 LAB — BASIC METABOLIC PANEL
Anion gap: 16 — ABNORMAL HIGH (ref 5–15)
Anion gap: 10 (ref 5–15)
BUN: 58 mg/dL — ABNORMAL HIGH (ref 6–20)
BUN: 60 mg/dL — ABNORMAL HIGH (ref 6–20)
CHLORIDE: 89 mmol/L — AB (ref 101–111)
CO2: 32 mmol/L (ref 22–32)
CO2: 36 mmol/L — AB (ref 22–32)
CREATININE: 1.87 mg/dL — AB (ref 0.44–1.00)
Calcium: 8.6 mg/dL — ABNORMAL LOW (ref 8.9–10.3)
Calcium: 8.6 mg/dL — ABNORMAL LOW (ref 8.9–10.3)
Chloride: 86 mmol/L — ABNORMAL LOW (ref 101–111)
Creatinine, Ser: 2.44 mg/dL — ABNORMAL HIGH (ref 0.44–1.00)
GFR calc Af Amer: 23 mL/min — ABNORMAL LOW (ref >60)
GFR calc non Af Amer: 20 mL/min — ABNORMAL LOW (ref >60)
GFR calc non Af Amer: 27 mL/min — ABNORMAL LOW (ref >60)
GFR, EST AFRICAN AMERICAN: 31 mL/min — AB (ref >60)
GLUCOSE: 185 mg/dL — AB (ref 65–99)
Glucose, Bld: 133 mg/dL — ABNORMAL HIGH (ref 65–99)
POTASSIUM: 4.3 mmol/L (ref 3.5–5.1)
POTASSIUM: 4 mmol/L (ref 3.5–5.1)
SODIUM: 135 mmol/L (ref 135–145)
Sodium: 134 mmol/L — ABNORMAL LOW (ref 135–145)

## 2017-02-27 LAB — BLOOD GAS, ARTERIAL
Acid-base deficit: 2 mmol/L (ref 0.0–2.0)
BICARBONATE: 23.5 mmol/L (ref 20.0–28.0)
Drawn by: 44135
FIO2: 40
LHR: 20 {breaths}/min
O2 SAT: 84.9 %
PATIENT TEMPERATURE: 97.4
PCO2 ART: 43.9 mmHg (ref 32.0–48.0)
PEEP: 5 cmH2O
PH ART: 7.344 — AB (ref 7.350–7.450)
VT: 480 mL
pO2, Arterial: 59.2 mmHg — ABNORMAL LOW (ref 83.0–108.0)

## 2017-02-27 LAB — CBC
HCT: 38.1 % (ref 36.0–46.0)
HEMOGLOBIN: 12.6 g/dL (ref 12.0–15.0)
MCH: 25.9 pg — AB (ref 26.0–34.0)
MCHC: 33.1 g/dL (ref 30.0–36.0)
MCV: 78.4 fL (ref 78.0–100.0)
Platelets: 410 10*3/uL — ABNORMAL HIGH (ref 150–400)
RBC: 4.86 MIL/uL (ref 3.87–5.11)
RDW: 15 % (ref 11.5–15.5)
WBC: 22.6 10*3/uL — AB (ref 4.0–10.5)

## 2017-02-27 LAB — DIGOXIN LEVEL: DIGOXIN LVL: 3.1 ng/mL — AB (ref 0.8–2.0)

## 2017-02-27 LAB — GLUCOSE, CAPILLARY
GLUCOSE-CAPILLARY: 55 mg/dL — AB (ref 65–99)
GLUCOSE-CAPILLARY: 120 mg/dL — AB (ref 65–99)
Glucose-Capillary: 177 mg/dL — ABNORMAL HIGH (ref 65–99)
Glucose-Capillary: 163 mg/dL — ABNORMAL HIGH (ref 65–99)
Glucose-Capillary: 139 mg/dL — ABNORMAL HIGH (ref 65–99)
Glucose-Capillary: 134 mg/dL — ABNORMAL HIGH (ref 65–99)
Glucose-Capillary: 126 mg/dL — ABNORMAL HIGH (ref 65–99)

## 2017-02-27 LAB — LACTIC ACID, PLASMA
LACTIC ACID, VENOUS: 7.8 mmol/L — AB (ref 0.5–1.9)
Lactic Acid, Venous: 10.6 mmol/L (ref 0.5–1.9)

## 2017-02-27 LAB — PROCALCITONIN: Procalcitonin: 3.76 ng/mL

## 2017-02-27 MED ORDER — AMIODARONE HCL IN DEXTROSE 360-4.14 MG/200ML-% IV SOLN
30.0000 mg/h | INTRAVENOUS | Status: DC
  Administered 2017-02-27: 30 mg/h via INTRAVENOUS
  Filled 2017-02-27: qty 200

## 2017-02-27 MED ORDER — AMIODARONE IV BOLUS ONLY 150 MG/100ML
150.0000 mg | Freq: Once | INTRAVENOUS | Status: AC
  Administered 2017-02-27: 150 mg via INTRAVENOUS

## 2017-02-27 MED ORDER — DEXTROSE 50 % IV SOLN
25.0000 mL | Freq: Once | INTRAVENOUS | Status: AC
  Administered 2017-02-27: 25 mL via INTRAVENOUS

## 2017-02-27 MED ORDER — SODIUM CHLORIDE 0.9 % IV SOLN
3.0000 | Freq: Once | INTRAVENOUS | Status: AC
  Administered 2017-02-27: 3 via INTRAVENOUS
  Filled 2017-02-27: qty 120

## 2017-02-27 MED ORDER — AMIODARONE HCL IN DEXTROSE 360-4.14 MG/200ML-% IV SOLN
60.0000 mg/h | INTRAVENOUS | Status: AC
  Administered 2017-02-27: 60 mg/h via INTRAVENOUS

## 2017-02-27 MED ORDER — LIDOCAINE IN D5W 4-5 MG/ML-% IV SOLN
0.5000 mg/min | INTRAVENOUS | Status: DC
  Administered 2017-02-27: 1 mg/min via INTRAVENOUS
  Administered 2017-03-01: 0.5 mg/min via INTRAVENOUS
  Filled 2017-02-27 (×2): qty 500

## 2017-02-27 MED ORDER — DEXTROSE 50 % IV SOLN
INTRAVENOUS | Status: AC
  Filled 2017-02-27: qty 50

## 2017-02-27 MED ORDER — POTASSIUM CHLORIDE 20 MEQ/15ML (10%) PO SOLN
20.0000 meq | Freq: Every day | ORAL | Status: DC
  Administered 2017-02-27 – 2017-03-03 (×5): 20 meq
  Filled 2017-02-27 (×5): qty 15

## 2017-02-27 MED ORDER — SODIUM CHLORIDE 0.9 % IV BOLUS (SEPSIS)
500.0000 mL | Freq: Once | INTRAVENOUS | Status: AC
  Administered 2017-02-27: 500 mL via INTRAVENOUS

## 2017-02-27 MED ORDER — CEFEPIME HCL 1 G IJ SOLR
1.0000 g | INTRAMUSCULAR | Status: DC
  Administered 2017-02-28: 1 g via INTRAVENOUS
  Filled 2017-02-27: qty 1

## 2017-02-27 MED ORDER — LIDOCAINE BOLUS VIA INFUSION
100.0000 mg | Freq: Once | INTRAVENOUS | Status: AC
  Administered 2017-02-27: 100 mg via INTRAVENOUS
  Filled 2017-02-27: qty 100

## 2017-02-27 NOTE — Progress Notes (Addendum)
Chaplain responding for family support after code event.    Referral by RN, Nursing secretary.   Following family conference with Dr. Vassie Loll and RN Oneita Hurt, provided support with family of [name] (who goes by [name] or "[name]" - short for [facility]).  At this time, family is not interested in spending time at bedside.  They are concerned with not agitating [name].   They relate that "we are not hospital people" and state this is especially true for [name].  Describe "she would rather be at home" and "she never comes to the doctor."    They are appreciative of chaplain presence.   Family states that they rely on their faith for healing and invite chaplain to pray, describing that they "pray to [name] and in Jesus' name."   Chaplain shared prayers with family.  Family cautions not to pray in patient room, as they are fearful of what she may infer from this.     In conference with RN, team feels this family may not understand the gravity of pt's illness.   Spiritual care will continue to support family in their coping.  Available for support in family conferences.     [facility] Chaplain Burnis Kingfisher, MDiv  24h. [facility] Pager [phone]

## 2017-02-27 NOTE — Progress Notes (Signed)
NUTRITION NOTE  Pt seen for full assessment by this RD yesterday with associated note at 10:04 AM. Reviewed noted from yesterday afternoon through this AM. Also spoke with RN. Due to recurrent Codes and high aspiration risk, TF is off at this time. Will continue to monitor and follow per protocol for ability to restart TF.  Estimated Nutritional Needs:  Kcal:  1027-1308 (11-14 kcal/kg) Protein:  118 grams (2 grams/kg IBW) Fluid:  1.2-1.5 L/day   Trenton Gammon, MS, RD, LDN, [facility] Inpatient Clinical Dietitian Pager # [phone] After hours/weekend pager # [phone]

## 2017-02-27 NOTE — Progress Notes (Signed)
Inpatient Diabetes Program Recommendations  AACE/ADA: New Consensus Statement on Inpatient Glycemic Control (2015)  Target Ranges:  Prepandial:   less than 140 mg/dL      Peak postprandial:   less than 180 mg/dL (1-2 hours)      Critically ill patients:  140 - 180 mg/dL   Lab Results  Component Value Date   GLUCAP 163 (H) 02/27/2017   HGBA1C 6.2 (H) 06/28/2013    Review of Glycemic Control  Diabetes history: pre-DM (HgbA1C of 6.2% on 06/28/2013 Outpatient Diabetes medications: None Current orders for Inpatient glycemic control: Novolog 0-20 units Q4H NPO and intubated. On Solumedrol 80 mg Q6H  Inpatient Diabetes Program Recommendations:    ICU Glycemic Control Order Set  Will continue to follow.  Thank you. Ailene Ards, RD, LDN, CDE Inpatient Diabetes Coordinator [phone]

## 2017-02-27 NOTE — Progress Notes (Signed)
Progress Note  Patient Name: [name] Date of Encounter: 02/27/2017  Primary Cardiologist: Hilty  Subjective   Overnight she was having bradycardia with both amiodarone IV as well as lidocaine IV which was initiated yesterday evening/night. These were both stopped. Earlier this morning I was called secondary to short bursts of ventricular fibrillation. Her digoxin level came back at 3.1, elevated.  Inpatient Medications    Scheduled Meds: . amiodarone  150 mg Intravenous Once  . ceFEPime (MAXIPIME) IV  2 g Intravenous Q24H  . chlorhexidine gluconate (MEDLINE KIT)  15 mL Mouth Rinse BID  . Chlorhexidine Gluconate Cloth  6 each Topical Daily  . digoxin immune fab (DIGIFAB) IV  3 vial Intravenous Once  . feeding supplement (VITAL HIGH PROTEIN)  1,000 mL Per Tube Q24H  . insulin aspart  0-20 Units Subcutaneous Q4H  . magnesium sulfate 1 - 4 g bolus IVPB  2 g Intravenous NOW  . mouth rinse  15 mL Mouth Rinse QID  . methylPREDNISolone (SOLU-MEDROL) injection  80 mg Intravenous Q6H  . nicotine  7 mg Transdermal QHS  . pantoprazole (PROTONIX) IV  40 mg Intravenous Q24H  . potassium chloride  40 mEq Per Tube BID  . pravastatin  20 mg Per Tube Daily  . sodium chloride flush  10-40 mL Intracatheter Q12H  . sodium chloride flush  3 mL Intravenous Q12H  . sodium chloride flush  3 mL Intravenous Q12H   Continuous Infusions: . sodium chloride 10 mL/hr at 02/26/17 1300  . amiodarone    . amiodarone    . fentaNYL infusion INTRAVENOUS 140 mcg/hr (02/27/17 0335)  . lidocaine Stopped (02/27/17 0107)   PRN Meds: sodium chloride, bisacodyl, fentaNYL, levalbuterol, midazolam, sodium chloride, sodium chloride flush, sodium chloride flush   Vital Signs    Vitals:   02/27/17 0450 02/27/17 0500 02/27/17 0600 02/27/17 0700  BP:  123/71 116/60 (!) 148/74  Pulse:      Resp:  18 (!) 21 19  Temp: 97.6 F (36.4 C)     TempSrc: Axillary     SpO2:  95% 97% 97%  Weight:  205 lb 7.5 oz  (93.2 kg)    Height:        Intake/Output Summary (Last 24 hours) at 02/27/17 0756 Last data filed at 02/27/17 0600  Gross per 24 hour  Intake           547.95 ml  Output              435 ml  Net           112.95 ml   Filed Weights   02/25/17 0500 02/26/17 0500 02/27/17 0500  Weight: 203 lb 7.8 oz (92.3 kg) 205 lb 14.6 oz (93.4 kg) 205 lb 7.5 oz (93.2 kg)    Telemetry    Bursts of ventricular fibrillation - Personally Reviewed  ECG    02/27/17 at 1 AM-junctional heart rate 45 bpm, T-wave inversion inferior laterally - Personally Reviewed  Physical Exam   GEN: Ill-appearing on vent. Able to acknowledge questions, quite lucid currently  Neck: No JVD Cardiac: RRR, 3/6 systolic murmurLeft lower sternal border, no rubs, or gallops.  Respiratory: Vent noise bilaterally. GI: Soft, nontender, non-distended  MS: 1+ bilateral lower extremityedema; No deformity. Neuro:  Occasionally moving  Psych: As above fairly lucid  Labs    Chemistry Recent Labs Lab 02/25/17 0500  02/26/17 1232 02/26/17 1500 02/26/17 2002  NA 131*  < > 134* 134* 133*  K 2.9*  < > 4.2 4.1 4.3  CL 78*  < > 83* 82* 83*  CO2 43*  < > 35* 37* 38*  GLUCOSE 111*  < > 185* 226* 222*  BUN 30*  < > 43* 44* 47*  CREATININE 1.13*  < > 1.98* 1.85* 1.82*  CALCIUM 8.8*  < > 8.6* 8.5* 8.4*  PROT 7.8  --  7.6  --   --   ALBUMIN 2.2*  --  2.2*  --   --   AST 76*  --  185*  --   --   ALT 98*  --  149*  --   --   ALKPHOS 196*  --  258*  --   --   BILITOT 0.9  --  1.1  --   --   GFRNONAA 50*  < > 25* 27* 28*  GFRAA 57*  < > 29* 32* 32*  ANIONGAP 10  < > 16* 15 12  < > = values in this interval not displayed.   Hematology Recent Labs Lab 02/25/17 0500 02/26/17 0315 02/26/17 1232  WBC 17.7* 14.7* 25.7*  RBC 4.96 5.04 5.08  HGB 13.4 13.6 13.8  HCT 39.8 41.2 41.3  MCV 80.2 81.7 81.3  MCH 27.0 27.0 27.2  MCHC 33.7 33.0 33.4  RDW 15.1 15.0 15.0  PLT 394 382 416*    Cardiac Enzymes Recent Labs Lab  02/26/17 0315 02/26/17 0900 02/26/17 1500  TROPONINI 0.04* 0.14* 0.15*   No results for input(s): TROPIPOC in the last 168 hours.   BNP Recent Labs Lab 02/21/17 0930  BNP 759.3*     DDimer No results for input(s): DDIMER in the last 168 hours.   Radiology    Dg Abd 1 View  Result Date: 02/26/2017 CLINICAL DATA:  OG tube placement EXAM: ABDOMEN - 1 VIEW COMPARISON:  None. FINDINGS: The enteric tube extends well into the stomach with tip in the region of the distal gastric antrum. IMPRESSION: OG tube extends well into the stomach. Electronically Signed   By: Andreas Newport M.D.   On: 02/26/2017 04:42   Dg Chest Port 1 View  Result Date: 02/27/2017 CLINICAL DATA:  Respiratory failure, intubated EXAM: PORTABLE CHEST 1 VIEW COMPARISON:  Chest radiograph from one day prior. FINDINGS: Endotracheal tube tip is 4.9 cm above the carina. Enteric tube enters stomach with the tip not seen on this image. Right PICC terminates in the lower third of the superior vena cava. Stable cardiomediastinal silhouette with mild cardiomegaly. No pneumothorax. Small bilateral pleural effusions appear stable. Mild-to-moderate pulmonary edema appears slightly improved. IMPRESSION: 1. Well-positioned support structures as detailed. 2. Mild-to-moderate congestive heart failure, slightly improved. 3. Stable small bilateral pleural effusions. Electronically Signed   By: Ilona Sorrel M.D.   On: 02/27/2017 07:09   Dg Chest Port 1 View  Result Date: 02/26/2017 CLINICAL DATA:  Status post CPR.  Evaluate ET tube placement. EXAM: PORTABLE CHEST 1 VIEW COMPARISON:  02/26/2017 FINDINGS: There is a ET tube with tip situated 4 cm above the carina. Right arm PICC line tip is in the projection of the distal SVC. Nasogastric tube is in the stomach. Stable cardiac enlargement and pulmonary edema. IMPRESSION: 1. ET tube tip is situated above the carina. 2. No change in aeration to the lungs compared with previous exam. Electronically  Signed   By: Kerby Moors M.D.   On: 02/26/2017 13:14   Portable Chest Xray  Result Date: 02/26/2017 CLINICAL DATA:  66 y/o  F; post intubation. EXAM: PORTABLE CHEST 1 VIEW COMPARISON:  02/23/2017 chest radiograph. FINDINGS: Right PICC line tip projects over mid SVC. Transcutaneous pacing pads noted. Endotracheal tube is 6.3 cm from carina projecting between the bilateral clavicles. Diffuse airspace opacities are increased from the prior radiographs and may represent edema and/or pneumonia. No acute osseous abnormality is evident. IMPRESSION: Endotracheal tube 6.3 cm from carina. Increase in diffuse airspace opacities which may represent edema and/or pneumonia. Electronically Signed   By: Kristine Garbe M.D.   On: 02/26/2017 03:41    Cardiac Studies   Echocardiogram 02/17/17: - Left ventricle: The cavity size was normal. Wall thickness was   normal. Systolic function was normal. The estimated ejection   fraction was in the range of 50% to 55%. Wall motion was normal;   there were no regional wall motion abnormalities. - Ventricular septum: The contour showed diastolic flattening and   systolic flattening. - Left atrium: The atrium was moderately dilated. - Right ventricle: The cavity size was mildly dilated. Systolic   function was moderately reduced. - Right atrium: The atrium was moderately dilated. - Tricuspid valve: There was moderate regurgitation. - Pulmonary arteries: Systolic pressure was severely increased. PA   peak pressure: 70 mm Hg (S). - Pericardium, extracardiac: A small pericardial effusion was   identified.  Impressions:  - Technically difficult; normal LV systolic function; D shaped   septum suggestive of pulmonary hypertension; biatrial   enlargement; mild RVE with moderately reduced RV function;   moderate TR with severely elevated pulmonary pressure; small   pericardial effusion.  Patient Profile     66 y.o. female Critically ill with right  ventricular failure, severe pulmonary hypertension, atrial fibrillation paroxysmal, with cardiac arrest, ventricular fibrillation 2 on 02/26/17, requiring CPRand defibrillations as well as IV amiodarone and subsequent IV lidocaine with elevated digoxin level 3.1  Assessment & Plan    Digoxin toxicity  - I have ordered Digibind. Pharmacy to assist with dosing.  - Initial arrest occurred with hypokalemia.   - Potassium currently replete. Magnesium replete.  - Digoxin was discontinued 02/26/17. Dose was 0.125 mg once a day.  Ventricular fibrillation arrest  - Underlying structural heart disease, RV failure in the setting of hypokalemia originally and elevated digoxin level. She is still having runs of ventricular fibrillation, short bursts despite normal potassium and magnesium. Giving Digibind as above.  - Short burst of ventricular fibrillation are noted, torsade. Once again, potassium and magnesium are replete.   Junctional escape rhythm/prior complete heart block  - Both amiodarone IV as well as lidocaine IV were discontinued yesterday evening. Because she is currently with heart rate in the 80s, and she is demonstrating short bursts of ventricular fibrillation, we will rebolus with amiodarone 150 mg and run at 60. Monitor closely for any significant bradycardia.  - She demonstrated significant bradycardia when both amiodarone and lidocaine were used together. Perhaps if necessary, rebolus IV amiodarone only.  Chronic diastolic heart failure  - -11.6 out net.  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  - Amiodarone was presumed previously to be because for her pulmonary fibrosis however in review of CT scans prior to amiodarone initiation she did show evidence of fibrotic changes, perhaps from her rheumatoid arthritis/autoimmune condition.  Critical care time 40 minutes spent with  patient, discussion with son, discussion with care team/CCM in the setting of multisystem organ failure,  careful data review.   Signed, Candee Furbish, MD  02/27/2017, 7:56 AM

## 2017-02-27 NOTE — Progress Notes (Signed)
Patient went pulseless and into Torsades. CCM NP and MD at bedside. Patient shocked 1 time and pulse ann normal sinus rhythm/ sinus tachycardia followed. Post arrest, NP to add order for amiodarone bolus

## 2017-02-27 NOTE — Progress Notes (Signed)
Two RT's attempted aline. Aline would not thread. NP notified aline pending.

## 2017-02-27 NOTE — Progress Notes (Signed)
eLink Physician-Brief Progress Note Patient Name: [name] DOB: June 08, 1951 MRN: [id number]   Date of Service  02/27/2017  HPI/Events of Note  Hypotension - Likely related to sedation with Fentanyl IV infusion. BP = 86/55.  eICU Interventions  Will order: 1. Monitor CVP. 2. Bolus with d0.9 NaCl 500 mL IV over 30 minutes now.      Intervention Category Major Interventions: Hypotension - evaluation and management  Sommer,Steven Eugene 02/27/2017, 7:00 PM

## 2017-02-27 NOTE — Progress Notes (Signed)
Pharmacy Antibiotic Note  [name] is a 66 y.o. female admitted on 02/20/2017 with sepsis.  Pharmacy has been consulted for cefepime dosing.  AKI and SCr worsening today warranted dose adjustment.  Plan: Adjust cefepime to 1g IV q24h.  Height: _0  (167.6 cm) Weight: 205 lb 7.5 oz (93.2 kg) IBW/kg (Calculated) : 59.3  Temp (24hrs), Avg:97.3 F (36.3 C), Min:96.3 F (35.7 C), Max:98.4 F (36.9 C)   Recent Labs Lab 03/09/2017 0445  02/25/17 0500  02/26/17 0315 02/26/17 0318 02/26/17 0700 02/26/17 0900 02/26/17 1232 02/26/17 1500 02/26/17 2002 02/27/17 0155 02/27/17 0445 02/27/17 0824  WBC 19.9*  --  17.7*  --  14.7*  --   --   --  25.7*  --   --   --   --  22.6*  CREATININE  --   < > 1.13*  < > 1.65*  --   --  1.90* 1.98* 1.85* 1.82*  --   --  2.44*  LATICACIDVEN  --   --   --   --   --  10.0* 5.2*  --  5.7*  --   --  10.6* 7.8*  --   < > = values in this interval not displayed.  Estimated Creatinine Clearance: 26.1 mL/min (A) (by C-G formula based on SCr of 2.44 mg/dL (H)).    Allergies  Allergen Reactions  . Ativan [Lorazepam] Other (See Comments)    Ativan has opposite effect. She becomes agitated and confused.   . Levaquin [Levofloxacin] Hives  . Penicillins Swelling    Has patient had a PCN reaction causing immediate rash, facial/tongue/throat swelling, SOB or lightheadedness with hypotension yes Has patient had a PCN reaction causing severe rash involving mucus membranes or skin necrosis: unknown Has patient had a PCN reaction that required hospitalization yes Has patient had a PCN reaction occurring within the last 10 years: no If all of the above answers are "NO", then may proceed with Cephalosporin use.     Antimicrobials this admission: 4/2 Zithromax/Rocephin per Md >> 4/5 cefepime >> 4/5 vancomycin >> 4/5  Dose adjustments this admission:  Microbiology results: 4/5 BAL cx: ngtd 4/2 BCx: ngtd 3/26 MRSA PCR: neg  Thank you for allowing  pharmacy to be a part of this patient's care.  [name] 02/27/2017 1:40 PM

## 2017-02-27 NOTE — Progress Notes (Signed)
Patient's heart rate has been progressively slowing down.  E link made aware and referred me to cardiology.  Cardiology paged and I will continue to monitor the patient and her heart rate.

## 2017-02-27 NOTE — Progress Notes (Signed)
Reviewed for possible ALine placement.  RT attempted bilateral upper extremities earlier in the day and unable to thread catheter/guidewire.  The patient has been sedated and has been hemodynamically stable with stability of rhythm since early am.  Will hold on aline placement for now.     Canary Brim, NP-C [facility] Pgr: [phone] or if no answer [phone] 02/27/2017, 4:22 PM

## 2017-02-27 NOTE — Progress Notes (Signed)
Technical Description:  - CPR performance duration:  2 mins   - Was defibrillation or cardioversion used?                                                              Yes  - Was external pacer placed? No  - Was patient intubated pre/post CPR? Yes    Post CPR evaluation:  - Final Status - Was patient successfully resuscitated ?  Y - What is current rhythm?  Atrial fibrillation - What is current hemodynamic status?  Stable, no pressors  Miscellaneous Information:  - Labs sent, including: ABG     - Family Notified? Yes  - Additional notes/ transfer status:  Discussed with cardiology, will involve EP, continue amiodarone, replete electrolytes, Digibind has been administered, developed bradycardia with lidocaine  Oretha Milch. MD

## 2017-02-27 NOTE — Progress Notes (Addendum)
Pt has had a continued trend of slowing heart rate.  E link made aware and told to discontinue Lidocaine.  Paged the cardiology fellow and was told to dc amiodarone and take a digoxin level and lactic acid level. Digoxin level came back 3.1, E-Link made aware and cardiology was paged. Will continue to monitor patient.

## 2017-02-27 NOTE — Progress Notes (Signed)
    I spoke with Dr. Berton Mount electrophysiology who will see [name] in consultation.  With her heart rate currently in atrial fibrillation 113 this hopefully will help avoid further episodes of Torsade, which can be pause dependent and worsen with bradycardia. We may need to utilize isoproterenol to help with fend off further ventricular arrhythmias.  Donato Schultz, MD

## 2017-02-27 NOTE — Progress Notes (Signed)
Walked into patient room to do assessment. Patient alert and responsive however had rhythm change from normal sinus rhythm to a bigeminy with extremely frequent runs of V.fib ranging from 4-12 beats. CCM NP and MD paged as well as cardiology. Dr. Anne Fu to respond. Per MD to add order amiodarone bolus of 150mg  and restart infusion at 60mg  an hour after bolus. Cardiology to also consult pharmacy about administration of Digibond. MD aware of digoxin level of 3.1

## 2017-02-27 NOTE — Progress Notes (Addendum)
Chaplain checked in on family.  Family member requested prayer for [name].  Family and Chaplain prayed.  Family appreciative of Chaplain presence and expressed their love for prayer in Jesus name.    02/27/17 1533  Clinical Encounter Type  Visited With Family  Visit Type Follow-up;Critical Care;Spiritual support;Social support;Psychological support  Referral From Chaplain  Consult/Referral To Chaplain  Spiritual Encounters  Spiritual Needs Prayer  Stress Factors  Patient Stress Factors Health changes  Family Stress Factors Exhausted;Health changes   Beryl Meager. Chaplain Resident

## 2017-02-27 NOTE — Progress Notes (Signed)
PULMONARY / CRITICAL CARE MEDICINE   Name: [name] MRN: [id number] DOB: 1951-01-18    ADMISSION DATE:  01/29/2017 CONSULTATION DATE:  02/26/17  REFERRING MD:  Dr. 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 Cough x2 months with frothy sputum which became blood tinged prior to admission, with some leg edema and orthopnea. She developed Afib with RVR, CXR showed interstitial edema along with 8 lb weight increase suggestive of acute DCHF. Was put on lasix, dilt gtt and digoxin. She could not tolerate bipap, we were consulted due to hemoptysis and hypoxic resp failure.   Signed off 3/28 with plans for outpatient follow up.  Early AM 4/5, pt had V.fib arrest requiring defib x 4, epi, bicarb.  V.fib would break momentarily after defib but then recur immediately. It was not until amio was started did V.fib break fully.  She was intubated by anesthesia and PCCM to assume care.  Of note, following intubation, she had frank hemoptysis again, dark colored.  Autoimmune labs from 3/28 as follows: ESR neg, Anti Scl-70 neg, CRP elevated 19.1, dsDNA neg, CCP antibodies elevated at 36, ANCA neg, GBM Ab neg,  RF elevated at 15.7.    SUBJECTIVE:  Pt with multiple episodes of VT.  Amio gtt restarted early am.  Brief arrest with ~ 1 minute of CPR / shock x1  VITAL SIGNS: BP (!) 147/56   Pulse 63   Temp 97.6 F (36.4 C) (Axillary)   Resp (!) 24   Ht _0  (1.676 m)   Wt 205 lb 7.5 oz (93.2 kg)   SpO2 94%   BMI 33.16 kg/m   HEMODYNAMICS:    VENTILATOR SETTINGS: Vent Mode: PRVC FiO2 (%):  [40 %-50 %] 50 % Set Rate:  [20 bmp] 20 bmp Vt Set:  [480 mL] 480 mL PEEP:  [5 cmH20] 5 cmH20 Plateau Pressure:  [18 cmH20-27 cmH20] 23 cmH20  INTAKE / OUTPUT: I/O last 3 completed  shifts: In: 1321.1 [I.V.:721.1; IV [id number]:782] Out: 9562 [Urine:1085]  PHYSICAL EXAMINATION: General: critically ill appearing female on vent HEENT: MM pink/moist, ETT, bloody secretions Neuro: intermittently awake/alert, follows commands CV: s1s2 rrr, ? SEM PULM: even/non-labored, lungs bilaterally coarse ZH:YQMV, non-tender, bsx4 active  Extremities: warm/dry, trace BLE edema  Skin: no rashes or lesions   LABS:  BMET  Recent Labs Lab 02/26/17 1232 02/26/17 1500 02/26/17 2002  NA 134* 134* 133*  K 4.2 4.1 4.3  CL 83* 82* 83*  CO2 35* 37* 38*  BUN 43* 44* 47*  CREATININE 1.98* 1.85* 1.82*  GLUCOSE 185* 226* 222*    Electrolytes  Recent Labs Lab 02/26/17 0315  02/26/17 1232 02/26/17 1500 02/26/17 2002  CALCIUM 8.7*  < > 8.6* 8.5* 8.4*  MG 2.5*  --  2.4  --  2.8*  PHOS 4.6  --   --   --   --   < > = values in this interval not displayed.  CBC  Recent Labs Lab 02/26/17 0315 02/26/17 1232 02/27/17 0824  WBC 14.7* 25.7* 22.6*  HGB 13.6 13.8 12.6  HCT 41.2 41.3 38.1  PLT 382 416* 410*    Coag's  Recent Labs Lab 03/23/2017 0445  INR 1.15    Sepsis Markers  Recent Labs Lab  02/26/17 0315  02/26/17 1232 02/27/17 0155 02/27/17 0445  LATICACIDVEN  --   < > 5.7* 10.6* 7.8*  PROCALCITON 0.45  --   --   --   --   < > = values in this interval not displayed.  ABG  Recent Labs Lab 02/26/17 0300 02/27/17 0205  PHART 7.403 7.344*  PCO2ART 63.5* 43.9  PO2ART 69.3* 59.2*    Liver Enzymes  Recent Labs Lab 02/25/17 0500 02/26/17 1232  AST 76* 185*  ALT 98* 149*  ALKPHOS 196* 258*  BILITOT 0.9 1.1  ALBUMIN 2.2* 2.2*    Cardiac Enzymes  Recent Labs Lab 02/26/17 0315 02/26/17 0900 02/26/17 1500  TROPONINI 0.04* 0.14* 0.15*    Glucose  Recent Labs Lab 02/26/17 1129 02/26/17 1525 02/26/17 2104 02/26/17 2342 02/27/17 0812  GLUCAP 116* 164* 134* 185* 177*    Imaging Dg Chest Port 1 View  Result Date: 02/27/2017 CLINICAL  DATA:  Respiratory failure, intubated EXAM: PORTABLE CHEST 1 VIEW COMPARISON:  Chest radiograph from one day prior. FINDINGS: Endotracheal tube tip is 4.9 cm above the carina. Enteric tube enters stomach with the tip not seen on this image. Right PICC terminates in the lower third of the superior vena cava. Stable cardiomediastinal silhouette with mild cardiomegaly. No pneumothorax. Small bilateral pleural effusions appear stable. Mild-to-moderate pulmonary edema appears slightly improved. IMPRESSION: 1. Well-positioned support structures as detailed. 2. Mild-to-moderate congestive heart failure, slightly improved. 3. Stable small bilateral pleural effusions. Electronically Signed   By: Ilona Sorrel M.D.   On: 02/27/2017 07:09   Dg Chest Port 1 View  Result Date: 02/26/2017 CLINICAL DATA:  Status post CPR.  Evaluate ET tube placement. EXAM: PORTABLE CHEST 1 VIEW COMPARISON:  02/26/2017 FINDINGS: There is a ET tube with tip situated 4 cm above the carina. Right arm PICC line tip is in the projection of the distal SVC. Nasogastric tube is in the stomach. Stable cardiac enlargement and pulmonary edema. IMPRESSION: 1. ET tube tip is situated above the carina. 2. No change in aeration to the lungs compared with previous exam. Electronically Signed   By: Kerby Moors M.D.   On: 02/26/2017 13:14    STUDIES:  ECHO 3/27 >>  EF 50-55%, PA pressure 70 mmHg, biatrial enlargement CXR 3/26 >> cardiomegaly, chronicfiibrotic changes on b/l bases, mild interstitial edema Chest CT 4/3 >> b/l pulm infiltrates, b/l pleural effusions L > R.  Increased mediastinal LAD.  CULTURES: BCx2 4/2 >>  BAL 4/5 >>   ANTIBIOTICS: Azithromycin 4/2 > 4/5 ceftrx 4/2 > 4/5 Vanc 4/5 > 4/5 Zosyn 4/5 > 4/5 Cefepime 4/6 >>   SIGNIFICANT EVENTS: 3/26  Admit with cough, hemoptysis, pulmonary edema in setting of AF w RVR 3/27  Multiple bolus of cardizem, Cardiology following 3/28  PCCM consulted for increased O2 needs, SOB. Signed  off 3/29  diuresed well, felt slightly better, HR improved 3/30 continued to diurese well with breathing better 3/31 continued to be hypoxic requiring high flow, continued to diurese well 4/02 started on abx for leukocytosis to cover CAP 4/03 lasix increased and metolazone added 4/05 cardiac arrest >VT 4/06 Cardiac arrest > VT  LINES/TUBES: ETT 4/5 >> Aline 4/6 >>  DISCUSSION: 66 y.o. F with ILD dating back to 2014, previously labelled as amio toxicity, severe pulm HTN admitted 3/26 with cough and hemoptysis, BL infiltrates, found to have AFRVR, pulmonary edema, volume overload.  Has been getting high dose lasix and diuresing well but still volume overloaded.  On 4/5 she  suffered a V.fib arrest (presumed due to hypokalemia after aggressive diuresis) > intubated and PCCM called back.  Recurrent VT and difficulty with rhythm management.    ASSESSMENT / PLAN:  PULMONARY A: Acute Hypoxic Respiratory Failure - suspect multifactorial in the setting of underlying fibrosis, pulmonary edema in the setting of AFwRVR and significant right heart failure, hemoptysis. 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 Tobacco Abuse. Pulmonary HTN - PA peak 70 on ECHO  P:   PRVC 8 cc/kg  Wean PEEP / FiO2 for sats > 92% Continue bronchodilators Empiric solumedrol 39m IV Q6 Trend CXR   CARDIOVASCULAR A:  V.fib arrest - presumed due to hypokalemia after aggressive diuresis.  s/p defib x 4 with only brief response before recurrent V.fib;  Only broke after amio given. VT - recurrent episodes Atrial Fibrillation with RVR. Chronic Diastolic CHF - Echo from 32/80/03with EF 50-55%, biatrial enlargement, Mod TR, PAP 70. HTN, HLD. Digoxin Toxicity  P:  Continue amiodarone gtt (despite  [facility]able [facility]x [facility] appreciate input  Consider EP consult [facility]  Hold diuresis 4/6  Digibind for digoxin toxicity   RENAL A:   AKI on CKD III. Hypokalemia - exacerbated by aggressive diuresis. Hyponatremia  Contraction alkalosis. P:   Trend BMP / urinary output Replace electrolytes as indicated Avoid nephrotoxic agents, ensure adequate renal perfusion Reduce scheduled potassium to 20 mEq per day Repeat BMP at 2000  GASTROINTESTINAL A:   Hx AVM of Small Bowel. Nutrition. P:   NPO  PPI for SUP  Hold TF for now with instability PRN dulcolax   HEMATOLOGIC A:   Hx Macrocytic Anemia. VTE prophylaxis. P:  Trend CBC SCD's for DVT prophylaxis   INFECTIOUS A:   Concern for HCAP - has had prolonged hospitalization. P:   ABX as above, D5/x Trend CXR, PCT, WBC / fever  ENDOCRINE A:   Borderline DM.    P:   Monitor glucose on BMP  SSI  NEUROLOGIC A: Acute encephalopathy - due to sedation.   Hx Schizophrenia, Anxiety / Depression. P:   RASS Goal: -2 to -3  Fentanyl gtt for pain  PRN versed for sedation  Daily WUA / SBT when stable    FAMILY  - Updates: Multiple members of family updated on status.  Pt remains critically ill.  Full code.    - Inter-disciplinary family meet or Palliative Care meeting due by:  03/04/17.   CC Time: 30 minutes   BNoe Gens NP-C [facility] Pgr: [phone] or if no answer [phone]/04/2017, 10:48 AM

## 2017-02-27 NOTE — Progress Notes (Signed)
Dr. Graciela Husbands has reviewed patient telemetry strips and rhythm is c/w torsades, review of historical EKGs show some that are concerning for long QT.   Recommend lidocaine gtt and stopping/avoid any potential QT prolonging medications.   Francis Dowse, PA-C

## 2017-02-28 ENCOUNTER — Inpatient Hospital Stay (HOSPITAL_COMMUNITY): Payer: Medicare Other

## 2017-02-28 DIAGNOSIS — I472 Ventricular tachycardia: Secondary | ICD-10-CM

## 2017-02-28 DIAGNOSIS — I4581 Long QT syndrome: Secondary | ICD-10-CM

## 2017-02-28 LAB — CULTURE, BAL-QUANTITATIVE W GRAM STAIN

## 2017-02-28 LAB — GLUCOSE, CAPILLARY
GLUCOSE-CAPILLARY: 181 mg/dL — AB (ref 65–99)
Glucose-Capillary: 136 mg/dL — ABNORMAL HIGH (ref 65–99)
Glucose-Capillary: 84 mg/dL (ref 65–99)
Glucose-Capillary: 129 mg/dL — ABNORMAL HIGH (ref 65–99)
Glucose-Capillary: 117 mg/dL — ABNORMAL HIGH (ref 65–99)
Glucose-Capillary: 165 mg/dL — ABNORMAL HIGH (ref 65–99)

## 2017-02-28 LAB — CULTURE, BAL-QUANTITATIVE: CULTURE: NO GROWTH

## 2017-02-28 LAB — CULTURE, BLOOD (ROUTINE X 2)
CULTURE: NO GROWTH
Culture: NO GROWTH

## 2017-02-28 LAB — BASIC METABOLIC PANEL
ANION GAP: 10 (ref 5–15)
Anion gap: 11 (ref 5–15)
BUN: 62 mg/dL — ABNORMAL HIGH (ref 6–20)
BUN: 75 mg/dL — AB (ref 6–20)
CALCIUM: 8.5 mg/dL — AB (ref 8.9–10.3)
CALCIUM: 8.5 mg/dL — AB (ref 8.9–10.3)
CO2: 35 mmol/L — AB (ref 22–32)
CO2: 33 mmol/L — ABNORMAL HIGH (ref 22–32)
CREATININE: 1.74 mg/dL — AB (ref 0.44–1.00)
Chloride: 89 mmol/L — ABNORMAL LOW (ref 101–111)
Chloride: 91 mmol/L — ABNORMAL LOW (ref 101–111)
Creatinine, Ser: 2.39 mg/dL — ABNORMAL HIGH (ref 0.44–1.00)
GFR calc Af Amer: 23 mL/min — ABNORMAL LOW (ref >60)
GFR, EST AFRICAN AMERICAN: 34 mL/min — AB (ref >60)
GFR, EST NON AFRICAN AMERICAN: 29 mL/min — AB (ref >60)
GFR, EST NON AFRICAN AMERICAN: 20 mL/min — AB (ref >60)
GLUCOSE: 156 mg/dL — AB (ref 65–99)
Glucose, Bld: 154 mg/dL — ABNORMAL HIGH (ref 65–99)
Potassium: 4.3 mmol/L (ref 3.5–5.1)
Potassium: 5 mmol/L (ref 3.5–5.1)
SODIUM: 134 mmol/L — AB (ref 135–145)
Sodium: 135 mmol/L (ref 135–145)

## 2017-02-28 LAB — CBC
HEMATOCRIT: 43.1 % (ref 36.0–46.0)
HEMOGLOBIN: 14.2 g/dL (ref 12.0–15.0)
MCH: 27.1 pg (ref 26.0–34.0)
MCHC: 32.9 g/dL (ref 30.0–36.0)
MCV: 82.3 fL (ref 78.0–100.0)
Platelets: 359 10*3/uL (ref 150–400)
RBC: 5.24 MIL/uL — AB (ref 3.87–5.11)
RDW: 15.3 % (ref 11.5–15.5)
WBC: 20.4 10*3/uL — AB (ref 4.0–10.5)

## 2017-02-28 LAB — DIGOXIN LEVEL: Digoxin Level: 2.2 ng/mL — ABNORMAL HIGH (ref 0.8–2.0)

## 2017-02-28 LAB — MAGNESIUM: Magnesium: 3.1 mg/dL — ABNORMAL HIGH (ref 1.7–2.4)

## 2017-02-28 LAB — PROCALCITONIN: Procalcitonin: 4.09 ng/mL

## 2017-02-28 MED ORDER — SODIUM CHLORIDE 0.9 % IV SOLN
0.0000 ug/min | INTRAVENOUS | Status: DC
  Administered 2017-02-28 (×2): 50 ug/min via INTRAVENOUS
  Administered 2017-03-01 (×2): 100 ug/min via INTRAVENOUS
  Administered 2017-03-01: 70 ug/min via INTRAVENOUS
  Filled 2017-02-28 (×6): qty 1

## 2017-02-28 MED ORDER — SODIUM CHLORIDE 0.9 % IV SOLN
1.0000 mg/h | INTRAVENOUS | Status: DC
  Administered 2017-02-28: 2 mg/h via INTRAVENOUS
  Administered 2017-03-01: 1 mg/h via INTRAVENOUS
  Administered 2017-03-02: 4 mg/h via INTRAVENOUS
  Filled 2017-02-28 (×3): qty 10

## 2017-02-28 MED ORDER — SODIUM CHLORIDE 0.9 % IV BOLUS (SEPSIS): 500.0000 mL | Freq: Once | INTRAVENOUS | Status: DC

## 2017-02-28 MED ORDER — DEXTROSE 5 % IV SOLN
1.0000 g | Freq: Two times a day (BID) | INTRAVENOUS | Status: DC
  Administered 2017-02-28 – 2017-03-03 (×6): 1 g via INTRAVENOUS
  Filled 2017-02-28 (×8): qty 1

## 2017-02-28 MED ORDER — SODIUM CHLORIDE 0.9 % IV SOLN: INTRAVENOUS | Status: DC | PRN

## 2017-02-28 MED ORDER — METHYLPREDNISOLONE SODIUM SUCC 125 MG IJ SOLR
60.0000 mg | Freq: Two times a day (BID) | INTRAMUSCULAR | Status: DC
  Administered 2017-02-28 – 2017-03-01 (×2): 60 mg via INTRAVENOUS
  Filled 2017-02-28 (×2): qty 2

## 2017-02-28 NOTE — Progress Notes (Signed)
RT unable to place A-line. Anesthesia called to insert. Per Anesthesia they will come after their next procedure. Time frame given was a couple hours. Will continue to follow up and use blood pressure cuff in the mean time.

## 2017-02-28 NOTE — Progress Notes (Addendum)
eLink Physician-Brief Progress Note Patient Name: [name] DOB: 1951/02/08 MRN: [id number]   Date of Service  02/28/2017  HPI/Events of Note  Patient now back in AFIB with RVR. Ventricular rate = 143.  BP = 66/38. Can't use Amiodarone d/t prolonged QTc with Torsade and ILD. Spoke with Cardiology Fellow on call who had very little to offer.   eICU Interventions  Will order: 1. Phenylephrine IV infusion. Titrate to MAP >= 65.  2. BMP and Mg++ now.  When BP improved will try Esmolol IV infusion.      Intervention Category Major Interventions: Arrhythmia - evaluation and management  Emiley Digiacomo Eugene 02/28/2017, 6:01 PM

## 2017-02-28 NOTE — Progress Notes (Signed)
PULMONARY / CRITICAL CARE MEDICINE   Name: [name] MRN: [id number] DOB: [birth year]    ADMISSION DATE:  02/07/2017 CONSULTATION DATE:  02/26/17  REFERRING MD:  Dr. 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 Cough x2 months with frothy sputum which became blood tinged prior to admission, with some leg edema and orthopnea. She developed Afib with RVR, CXR showed interstitial edema along with 8 lb weight increase suggestive of acute DCHF. Was put on lasix, dilt gtt and digoxin. She could not tolerate bipap, we were consulted due to hemoptysis and hypoxic resp failure.   Signed off 3/28 with plans for outpatient follow up.  Early AM 4/5, pt had V.fib arrest requiring defib x 4, epi, bicarb.  V.fib would break momentarily after defib but then recur immediately. It was not until amio was started did V.fib break fully.  She was intubated by anesthesia and PCCM to assume care.  Of note, following intubation, she had frank hemoptysis again, dark colored.  Autoimmune labs from 3/28 as follows: ESR neg, Anti Scl-70 neg, CRP elevated 19.1, dsDNA neg, CCP antibodies elevated at 36, ANCA neg, GBM Ab neg,  RF elevated at 15.7.    SUBJECTIVE:   VT requiring cardioversion 4/6 while her sedation was down, during period of agitation Has had some rhythmic movements of her head and upper body when she is stimulated, they persist, ? Myoclonic Now on lidocaine, off amiodarone  VITAL SIGNS: BP 132/70 (BP Location: Left Arm)   Pulse 63   Temp 99.8 F (37.7 C) (Axillary)   Resp 17   Ht _0  (1.676 m)   Wt 96 kg (211 lb 10.3 oz)   SpO2 96%   BMI 34.16 kg/m   HEMODYNAMICS: CVP:  [14 mmHg-24 mmHg] 24 mmHg  VENTILATOR SETTINGS: Vent Mode: PRVC FiO2 (%):  [50 %-70 %] 50 % Set Rate:   [20 bmp] 20 bmp Vt Set:  [480 mL] 480 mL PEEP:  [8 cmH20] 8 cmH20 Plateau Pressure:  [13 cmH20-30 cmH20] 26 cmH20  INTAKE / OUTPUT: I/O last 3 completed shifts: In: 2002.8 [I.V.:1812.8; NG/GT:90; IV Piggyback:100] Out: 8546 [Urine:1690]  PHYSICAL EXAMINATION: General: Intubated, uncomfortable appearing, rhythmic movements of her head and upper thorax when stimulated HEENT: ET tube in position, bloody secretions noted Neuro: Sedated, opens eyes to voice and stimulation, rhythmic movements with any movement or stimulation CV: Regular, tachycardic, distant heart sounds PULM: Coarse bilaterally GI: Soft, non-distended, positive bowel sounds Extremities: Bilateral lower showed trace edema,  Skin: No rash   LABS:  BMET  Recent Labs Lab 02/27/17 0824 02/27/17 2000 02/28/17 0340  NA 134* 135 134*  K 4.3 4.0 4.3  CL 86* 89* 89*  CO2 32 36* 35*  BUN 58* 60* 62*  CREATININE 2.44* 1.87* 1.74*  GLUCOSE 185* 133* 154*    Electrolytes  Recent Labs Lab 02/26/17 0315  02/26/17 1232  02/26/17 2002 02/27/17 0824 02/27/17 2000 02/28/17 0340  CALCIUM 8.7*  < > 8.6*  < > 8.4* 8.6* 8.6* 8.5*  MG 2.5*  --  2.4  --  2.8*  --   --   --   PHOS 4.6  --   --   --   --   --   --   --   < > =  values in this interval not displayed.  CBC  Recent Labs Lab 02/26/17 1232 02/27/17 0824 02/28/17 0745  WBC 25.7* 22.6* 20.4*  HGB 13.8 12.6 14.2  HCT 41.3 38.1 43.1  PLT 416* 410* 359    Coag's  Recent Labs Lab 03/12/2017 0445  INR 1.15    Sepsis Markers  Recent Labs Lab 02/26/17 0315  02/26/17 1232 02/27/17 0155 02/27/17 0445 02/27/17 0824 02/28/17 0340  LATICACIDVEN  --   < > 5.7* 10.6* 7.8*  --   --   PROCALCITON 0.45  --   --   --   --  3.76 4.09  < > = values in this interval not displayed.  ABG  Recent Labs Lab 02/26/17 0300 02/27/17 0205  PHART 7.403 7.344*  PCO2ART 63.5* 43.9  PO2ART 69.3* 59.2*    Liver Enzymes  Recent Labs Lab 02/25/17 0500  02/26/17 1232  AST 76* 185*  ALT 98* 149*  ALKPHOS 196* 258*  BILITOT 0.9 1.1  ALBUMIN 2.2* 2.2*    Cardiac Enzymes  Recent Labs Lab 02/26/17 0315 02/26/17 0900 02/26/17 1500  TROPONINI 0.04* 0.14* 0.15*    Glucose  Recent Labs Lab 02/27/17 1221 02/27/17 1617 02/27/17 1942 02/27/17 2311 02/28/17 0313 02/28/17 0814  GLUCAP 163* 139* 134* 126* 136* 84    Imaging Dg Chest Port 1 View  Result Date: 02/28/2017 CLINICAL DATA:  Acute respiratory failure with hypoxia EXAM: PORTABLE CHEST 1 VIEW COMPARISON:  02/27/2017 FINDINGS: Cardiomegaly is stable. Endotracheal tube tip is seen approximately 5 cm above the carina. Gastric tube extends below the left hemidiaphragm. PICC line tip in the distal SVC. Interstitial pulmonary edema with bilateral pleural effusions are without significant change. Cardiomegaly is stable. Aortic atherosclerosis is noted. No acute osseous appearing abnormality. IMPRESSION: Stable support lines and tubes. Cardiomegaly with CHF and bilateral small pleural effusions without significant appearing change. Electronically Signed   By: Ashley Royalty M.D.   On: 02/28/2017 03:52    STUDIES:  ECHO 3/27 >>  EF 50-55%, PA pressure 70 mmHg, biatrial enlargement CXR 3/26 >> cardiomegaly, chronicfiibrotic changes on b/l bases, mild interstitial edema Chest CT 4/3 >> b/l pulm infiltrates, b/l pleural effusions L > R.  Increased mediastinal LAD.  CULTURES: BCx2 4/2 >>  BAL 4/5 >>   ANTIBIOTICS: Azithromycin 4/2 > 4/5 ceftrx 4/2 > 4/5 Vanc 4/5 > 4/5 Zosyn 4/5 > 4/5 Cefepime 4/6 >>   SIGNIFICANT EVENTS: 3/26  Admit with cough, hemoptysis, pulmonary edema in setting of AF w RVR 3/27  Multiple bolus of cardizem, Cardiology following 3/28  PCCM consulted for increased O2 needs, SOB. Signed off 3/29  diuresed well, felt slightly better, HR improved 3/30 continued to diurese well with breathing better 3/31 continued to be hypoxic requiring high flow, continued to  diurese well 4/02 started on abx for leukocytosis to cover CAP 4/03 lasix increased and metolazone added 4/05 cardiac arrest >VT 4/06 Cardiac arrest > VT 4/07 rhythmic movements upper body, ? myoclonic  LINES/TUBES: ETT 4/5 >> Aline 4/6 >>  DISCUSSION: 66 y.o. F with ILD dating back to 2014, previously labelled as amio toxicity, severe pulm HTN admitted 3/26 with cough and hemoptysis, BL infiltrates, found to have AFRVR, pulmonary edema, volume overload.  Has been getting high dose lasix and diuresing well but still volume overloaded.  On 4/5 she suffered a V.fib arrest (presumed due to hypokalemia after aggressive diuresis) > intubated and PCCM called back.  Recurrent VT and difficulty with rhythm management.    ASSESSMENT /  PLAN:  PULMONARY A: Acute Hypoxic Respiratory Failure - suspect multifactorial in the setting of underlying fibrosis, pulmonary edema in the setting of AFwRVR and significant right heart failure, hemoptysis. 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 Tobacco Abuse. Pulmonary HTN - PA peak 70 on ECHO  P:   Continue current ventilator strategy, 8 mL/kg Wean PEEP and FiO2 as able Continue prn bronchodilators I will wean Solu-Medrol in absence of active wheezing 4/7 Follow chest x-ray    CARDIOVASCULAR A:  V.fib arrest - presumed due to hypokalemia after aggressive diuresis.  s/p defib x 4 with only brief response before recurrent V.fib;  Only broke after amio given. VT - recurrent episodes Atrial Fibrillation with RVR. Chronic Diastolic CHF - Echo from 0/72/25 with EF 50-55%, biatrial enlargement, Mod TR, PAP 70. HTN, HLD. Digoxin Toxicity  P:  Amiodarone stopped as it can prolong the QT interval Lidocaine drip continued Appreciate EP  assistance Cardiac catheterization on hold for now Status post Digibind, digoxin on hold Hold diuresis at this time   RENAL A:   AKI on CKD III. Hypokalemia - exacerbated by aggressive diuresis. Hyponatremia  Contraction alkalosis. P:   Follow BMP and urinary output Replace electrolyte as indicated, follow closely given her dysrhythmia Continue scheduled potassium as ordered   GASTROINTESTINAL A:   Hx AVM of Small Bowel. Nutrition. P:   Nothing by mouth PPI for stress ulcer prophylaxis Holding tube feeds Bowel regimen  HEMATOLOGIC A:   Hx Macrocytic Anemia. VTE prophylaxis. P:  Follow CBC   SCD in place  INFECTIOUS A:   Concern for HCAP - has had prolonged hospitalization. P:   Antibiotics as above, changed to cefepime 4/6, day 6 of 8 Follow chest x-ray, white blood cell count, temperature  ENDOCRINE A:   Borderline DM.    P:   CBG & scale insulin per protocol  NEUROLOGIC A: Acute encephalopathy - due to sedation.   Hx Schizophrenia, Anxiety / Depression. Possible mild clonus versus agitation with rhythmic intentional movements of the head and torso, possible seizures, unclear P:   RASS Goal: -2 to -3  Fentanyl gtt for pain  Add Versed drip on 4/7 given her rhythmic movements EEG Consider neurology consultation depending on persistence of the rhythmic movements Daily wakeup assessment when more clinically stable   FAMILY  - Updates: Updated sister at bedside on 4/7.  Pt remains critically ill.  Full code.    - Inter-disciplinary family meet or Palliative Care meeting due by:  03/04/17.   Independent CC time 35 minutes  Baltazar Apo, MD, PhD 02/28/2017, 11:06 AM [facility] [phone] or if no answer [phone]

## 2017-02-28 NOTE — Consult Note (Signed)
Reason for Consult: torsades de pointe and VF  Referring Physician: Dr. Su Grand is an 66 y.o. female.   HPI: The patient is a 66 yo woman who is referred by Dr. Delton Coombes for evaluation of torsades de pointes in the setting of respiratory failure. She has a h/o pulmonary fibrosis and is on 2liters of oxygen. She has had a long h/o atrial fib and was treated with amiodarone until developing evidence of pulmonary toxicity. Her QT on amio was prolonged. She presented with respiratory failure and developed atrial fib. She was treated with IV amio as well as Azithromycin and developed marked prolongation of her QT in the setting of hypokalemia. She has had several episodes of torsades degenerating into VF with successful defibrillation. Her amio and azithromycin were stopped and her potassium has been repleted. She remains intubated and sedated. She was noted by nursing staff to have some myoclonic movements  PMH: Past Medical History:  Diagnosis Date  . Anemia   . Anxiety   . Arthritis    knees  . AVM (arteriovenous malformation) of small bowel, acquired (HCC)   . Borderline diabetes   . Chronic diastolic CHF (congestive heart failure), NYHA class 2 (HCC)   . Chronic respiratory failure (HCC)   . CKD (chronic kidney disease) stage 3, GFR 30-59 ml/min   . Depression   . GERD (gastroesophageal reflux disease)   . Headache(784.0)   . Hyperlipidemia   . Hypertension   . PAF (paroxysmal atrial fibrillation) (HCC)   . Pulmonary fibrosis (HCC)    After being on amiodarone  . Shortness of breath    on exertion    PSHX: Past Surgical History:  Procedure Laterality Date  . COLONOSCOPY N/A 02/25/2013   Procedure: COLONOSCOPY;  Surgeon: Theda Belfast, MD;  Location: [facility];  Service: Endoscopy;  Laterality: N/A;  . ESOPHAGOGASTRODUODENOSCOPY N/A 02/25/2013   Procedure: ESOPHAGOGASTRODUODENOSCOPY (EGD);  Surgeon: Theda Belfast, MD;  Location: [facility];  Service:  Endoscopy;  Laterality: N/A;  . GIVENS CAPSULE STUDY N/A 02/26/2013   Procedure: GIVENS CAPSULE STUDY;  Surgeon: Theda Belfast, MD;  Location: [facility];  Service: Endoscopy;  Laterality: N/A;  . HEMORRHOID SURGERY    . TOE SURGERY  05/31/2015    FAMHX: Family History  Problem Relation Age of Onset  . Kidney disease Mother   . Heart attack Mother   . Hypertension Mother   . Kidney disease Father   . Breast cancer Daughter   . Heart disease Brother   . Diabetes Brother   . Lung cancer Brother   . Hypertension Sister   . Hypertension Daughter     Social History:  reports that she has been smoking Cigarettes.  She has a 6.75 pack-year smoking history. She uses smokeless tobacco. She reports that she uses drugs, including Marijuana. She reports that she does not drink alcohol.  Allergies:  Allergies  Allergen Reactions  . Ativan [Lorazepam] Other (See Comments)    Ativan has opposite effect. She becomes agitated and confused.   . Levaquin [Levofloxacin] Hives  . Penicillins Swelling    Has patient had a PCN reaction causing immediate rash, facial/tongue/throat swelling, SOB or lightheadedness with hypotension yes Has patient had a PCN reaction causing severe rash involving mucus membranes or skin necrosis: unknown Has patient had a PCN reaction that required hospitalization yes Has patient had a PCN reaction occurring within the last 10 years: no If all of the above answers are "NO", then  may proceed with Cephalosporin use.    ROS - the patient's Review of symptoms could not be obtained as she is on ventilator, and no family. I have reviewed the prior history's ROS.   Medications: I have reviewed the patient's current medications.  Dg Chest Port 1 View  Result Date: 02/28/2017 CLINICAL DATA:  Acute respiratory failure with hypoxia EXAM: PORTABLE CHEST 1 VIEW COMPARISON:  02/27/2017 FINDINGS: Cardiomegaly is stable. Endotracheal tube tip is seen approximately 5 cm above the  carina. Gastric tube extends below the left hemidiaphragm. PICC line tip in the distal SVC. Interstitial pulmonary edema with bilateral pleural effusions are without significant change. Cardiomegaly is stable. Aortic atherosclerosis is noted. No acute osseous appearing abnormality. IMPRESSION: Stable support lines and tubes. Cardiomegaly with CHF and bilateral small pleural effusions without significant appearing change. Electronically Signed   By: Tollie Eth M.D.   On: 02/28/2017 03:52   Dg Chest Port 1 View  Result Date: 02/27/2017 CLINICAL DATA:  Respiratory failure, intubated EXAM: PORTABLE CHEST 1 VIEW COMPARISON:  Chest radiograph from one day prior. FINDINGS: Endotracheal tube tip is 4.9 cm above the carina. Enteric tube enters stomach with the tip not seen on this image. Right PICC terminates in the lower third of the superior vena cava. Stable cardiomediastinal silhouette with mild cardiomegaly. No pneumothorax. Small bilateral pleural effusions appear stable. Mild-to-moderate pulmonary edema appears slightly improved. IMPRESSION: 1. Well-positioned support structures as detailed. 2. Mild-to-moderate congestive heart failure, slightly improved. 3. Stable small bilateral pleural effusions. Electronically Signed   By: Delbert Phenix M.D.   On: 02/27/2017 07:09   Dg Chest Port 1 View  Result Date: 02/26/2017 CLINICAL DATA:  Status post CPR.  Evaluate ET tube placement. EXAM: PORTABLE CHEST 1 VIEW COMPARISON:  02/26/2017 FINDINGS: There is a ET tube with tip situated 4 cm above the carina. Right arm PICC line tip is in the projection of the distal SVC. Nasogastric tube is in the stomach. Stable cardiac enlargement and pulmonary edema. IMPRESSION: 1. ET tube tip is situated above the carina. 2. No change in aeration to the lungs compared with previous exam. Electronically Signed   By: Signa Kell M.D.   On: 02/26/2017 13:14    ROS  As stated in the HPI and negative for all other systems.  Physical  Exam  Vitals:Blood pressure (!) 105/54, pulse 63, temperature 99.8 F (37.7 C), temperature source Axillary, resp. rate (!) 22, height 5\' 6"  (1.676 m), weight 211 lb 10.3 oz (96 kg), SpO2 97 %.  Intubated, sedated HEENT: ET tube in place Neck:  7 cm JVD, no thyromegally Lymphatics:  No adenopathy Back:  No CVA tenderness Lungs:  Scattered rales and rhonchi HEART:  Regular rate rhythm, no murmurs, no rubs, no clicks Abd:  soft, positive bowel sounds, no organomegally, no rebound, no guarding Ext:  2 plus pulses, no edema, no cyanosis, no clubbing Skin:  No rashes no nodules Neuro:  Withdraws to pain.  ECG -NSR with prolonged QT   Assessment/Plan: 1. TDP/Long QT/VF -  Her current presentation is fairly typical. She will need continue supportive care, repletion of potassium and magnesium, avoidance of all QT prolonging meds. Will start to wean lidocaine. She will need metoprolol or nadolol. 2. Myoclonus - unclear what this represents. I am worried about lidocaine tox and will reduce her dose of lidocaine to 0.5 mg/min. Could stop lidocaine tomorrow if no additional VT. She will need a low dose of beta blocker, either metoprolol or nadolol. 3.  PAF - she is back in NSR. No good medical therapy exists for maintenance of NSR. She will need to avoid all QT prolonging meds. 4. Respiratory failure - I suspect that this is multifactorial. She will be treated with antibiotics, steroids, diuretics and we will follow her exam/cxr.  Sharlot Gowda TaylorMD 02/28/2017, 11:42 AM

## 2017-02-28 NOTE — Progress Notes (Signed)
Pharmacy Antibiotic Note  [name] is a 66 y.o. female admitted on 01/24/2017 with sepsis  Pharmacy has been consulted for cefepime dosing for HAP.  AKI and SCr worsening today warranted dose adjustment.  Plan: Day 3 Cefepime 1) Adjust cefepime to 1g IV q12h due to improved renal function 2) continue to monitor renal function  Height: _0  (167.6 cm) Weight: 211 lb 10.3 oz (96 kg) IBW/kg (Calculated) : 59.3  Temp (24hrs), Avg:97.8 F (36.6 C), Min:97.2 F (36.2 C), Max:99.8 F (37.7 C)   Recent Labs Lab 02/25/17 0500  02/26/17 0315 02/26/17 0318 02/26/17 0700  02/26/17 1232 02/26/17 1500 02/26/17 2002 02/27/17 0155 02/27/17 0445 02/27/17 0824 02/27/17 2000 02/28/17 0340 02/28/17 0745  WBC 17.7*  --  14.7*  --   --   --  25.7*  --   --   --   --  22.6*  --   --  20.4*  CREATININE 1.13*  < > 1.65*  --   --   < > 1.98* 1.85* 1.82*  --   --  2.44* 1.87* 1.74*  --   LATICACIDVEN  --   --   --  10.0* 5.2*  --  5.7*  --   --  10.6* 7.8*  --   --   --   --   < > = values in this interval not displayed.  Estimated Creatinine Clearance: 37.2 mL/min (A) (by C-G formula based on SCr of 1.74 mg/dL (H)).    Allergies  Allergen Reactions  . Ativan [Lorazepam] Other (See Comments)    Ativan has opposite effect. She becomes agitated and confused.   . Levaquin [Levofloxacin] Hives  . Penicillins Swelling    Has patient had a PCN reaction causing immediate rash, facial/tongue/throat swelling, SOB or lightheadedness with hypotension yes Has patient had a PCN reaction causing severe rash involving mucus membranes or skin necrosis: unknown Has patient had a PCN reaction that required hospitalization yes Has patient had a PCN reaction occurring within the last 10 years: no If all of the above answers are "NO", then may proceed with Cephalosporin use.     Antimicrobials this admission: 4/2 Zithromax/Rocephin per Md >> 4/5 cefepime >> 4/5 vancomycin >> 4/5  Dose  adjustments this admission:  Microbiology results: 4/5 BAL cx: ngtd 4/2 BCx: ngtd 3/26 MRSA PCR: neg   Thank you for allowing pharmacy to be a part of this patient's care.  Adrian Saran, PharmD, BCPS Pager [phone] 02/28/2017 10:25 AM

## 2017-02-28 NOTE — Progress Notes (Signed)
Went in to turn patient around 0915. It was noticed that the patient was having a jerking of the upper body similar to paradoxic breathing. While turning the patient the jerking motion became continuous. Unsure if patient is agitating, if the patient is paradoxic breathing, or if this is seizure activity. The patient was given prn dose of 1 mg of versed, the activity was observed for 3-4 minutes. Patient's vital signs remained stable with heart rate remaining normal sinus in the 70's. Patient is still having occasional jerking motion. CCM called and updated about the described event. Per MD continue to monitor until morning rounds.

## 2017-02-28 NOTE — Progress Notes (Signed)
Spoke with Elink MD and cardiology on call. Aware of patient change in heart rate and soft BP. Awaiting further instruction. Getting manual BP. EKG preformed.

## 2017-03-01 ENCOUNTER — Inpatient Hospital Stay (HOSPITAL_COMMUNITY): Payer: Medicare Other

## 2017-03-01 LAB — GLUCOSE, CAPILLARY
GLUCOSE-CAPILLARY: 142 mg/dL — AB (ref 65–99)
GLUCOSE-CAPILLARY: 132 mg/dL — AB (ref 65–99)
GLUCOSE-CAPILLARY: 99 mg/dL (ref 65–99)
GLUCOSE-CAPILLARY: 154 mg/dL — AB (ref 65–99)
Glucose-Capillary: 152 mg/dL — ABNORMAL HIGH (ref 65–99)
Glucose-Capillary: 131 mg/dL — ABNORMAL HIGH (ref 65–99)

## 2017-03-01 LAB — CBC
HCT: 36.2 % (ref 36.0–46.0)
Hemoglobin: 11.7 g/dL — ABNORMAL LOW (ref 12.0–15.0)
MCH: 25.8 pg — AB (ref 26.0–34.0)
MCHC: 32.3 g/dL (ref 30.0–36.0)
MCV: 79.9 fL (ref 78.0–100.0)
PLATELETS: 327 10*3/uL (ref 150–400)
RBC: 4.53 MIL/uL (ref 3.87–5.11)
RDW: 15.3 % (ref 11.5–15.5)
WBC: 18.4 10*3/uL — ABNORMAL HIGH (ref 4.0–10.5)

## 2017-03-01 LAB — BASIC METABOLIC PANEL
Anion gap: 13 (ref 5–15)
BUN: 71 mg/dL — AB (ref 6–20)
CALCIUM: 7 mg/dL — AB (ref 8.9–10.3)
CHLORIDE: 101 mmol/L (ref 101–111)
CO2: 24 mmol/L (ref 22–32)
CREATININE: 2.17 mg/dL — AB (ref 0.44–1.00)
GFR calc Af Amer: 26 mL/min — ABNORMAL LOW (ref >60)
GFR calc non Af Amer: 22 mL/min — ABNORMAL LOW (ref >60)
Glucose, Bld: 118 mg/dL — ABNORMAL HIGH (ref 65–99)
POTASSIUM: 4.3 mmol/L (ref 3.5–5.1)
SODIUM: 138 mmol/L (ref 135–145)

## 2017-03-01 LAB — PHOSPHORUS: Phosphorus: 5.3 mg/dL — ABNORMAL HIGH (ref 2.5–4.6)

## 2017-03-01 LAB — MAGNESIUM: MAGNESIUM: 2.7 mg/dL — AB (ref 1.7–2.4)

## 2017-03-01 MED ORDER — ESMOLOL HCL-SODIUM CHLORIDE 2000 MG/100ML IV SOLN
25.0000 ug/kg/min | INTRAVENOUS | Status: DC
  Administered 2017-03-01: 112.5 ug/kg/min via INTRAVENOUS
  Administered 2017-03-01: 25 ug/kg/min via INTRAVENOUS
  Administered 2017-03-02: 125 ug/kg/min via INTRAVENOUS
  Administered 2017-03-02 (×2): 137.5 ug/kg/min via INTRAVENOUS
  Administered 2017-03-02 (×5): 150 ug/kg/min via INTRAVENOUS
  Administered 2017-03-03 (×3): 125 ug/kg/min via INTRAVENOUS
  Filled 2017-03-01 (×17): qty 100

## 2017-03-01 MED ORDER — METOPROLOL TARTRATE 5 MG/5ML IV SOLN
2.5000 mg | Freq: Once | INTRAVENOUS | Status: AC
  Administered 2017-03-01: 2.5 mg via INTRAVENOUS
  Filled 2017-03-01: qty 5

## 2017-03-01 MED ORDER — PHENYLEPHRINE HCL-NACL 10-0.9 MG/250ML-% IV SOLN
0.0000 ug/min | INTRAVENOUS | Status: DC
  Administered 2017-03-01: 20 ug/min via INTRAVENOUS
  Administered 2017-03-02: 110 ug/min via INTRAVENOUS
  Administered 2017-03-02: 90 ug/min via INTRAVENOUS
  Administered 2017-03-02: 70 ug/min via INTRAVENOUS
  Filled 2017-03-01 (×8): qty 250

## 2017-03-01 MED ORDER — SUCRALFATE 1 GM/10ML PO SUSP
1.0000 g | Freq: Four times a day (QID) | ORAL | Status: DC
  Administered 2017-03-01 – 2017-03-03 (×7): 1 g
  Filled 2017-03-01 (×7): qty 10

## 2017-03-01 MED ORDER — METHYLPREDNISOLONE SODIUM SUCC 40 MG IJ SOLR
40.0000 mg | Freq: Every day | INTRAMUSCULAR | Status: DC
  Administered 2017-03-02: 40 mg via INTRAVENOUS
  Filled 2017-03-01: qty 1

## 2017-03-01 MED ORDER — SUCRALFATE 1 GM/10ML PO SUSP
1.0000 g | Freq: Four times a day (QID) | ORAL | Status: DC
Start: 2017-03-01 — End: 2017-03-01

## 2017-03-01 MED ORDER — METOPROLOL TARTRATE 5 MG/5ML IV SOLN
2.5000 mg | Freq: Four times a day (QID) | INTRAVENOUS | Status: DC
  Administered 2017-03-01 (×2): 2.5 mg via INTRAVENOUS
  Filled 2017-03-01 (×2): qty 5

## 2017-03-01 NOTE — Progress Notes (Signed)
eLink Physician-Brief Progress Note Patient Name: [name] DOB: Jan 08, 1951 MRN: [id number]   Date of Service  03/01/2017  HPI/Events of Note  Multiple issues: 1. Remains in AFIB with ventricular rate = 120's and 2. Patient needs stress ulcer prophylaxis. Can't use Pepcid or Protonix d/t QTc prolongation.   eICU Interventions  Will order: 1. Increase ceiling on Esmolol IV infusion to 120 mcg/kg/min. 2. Carafate 1 gm per tube Q 6 hours.      Intervention Category Major Interventions: Arrhythmia - evaluation and management Intermediate Interventions: Best-practice therapies (e.g. DVT, beta blocker, etc.)  Sommer,Steven Eugene 03/01/2017, 10:22 PM

## 2017-03-01 NOTE — Progress Notes (Signed)
Nutrition Follow-up  DOCUMENTATION CODES:   Obesity unspecified  INTERVENTION:   Continue Vital High Protein @ 20 mL/hr which provides 480 kcal, 42 grams of protein, and 401 mL free water.   Once medically appropriate advance to goal rate: Vital High Protein @ 50 mL/hr with 30 mL Prostat once/day. This regimen will provide 1300 kcal, 120 grams of protein, and 1003 mL free water.  Will continue to monitor for plan  NUTRITION DIAGNOSIS:   Inadequate oral intake related to inability to eat as evidenced by NPO status.  Ongoing.  GOAL:   Provide needs based on ASPEN/SCCM guidelines  Not meeting.  MONITOR:   Vent status, TF tolerance, Weight trends, Labs, I & O's  ASSESSMENT:   66 y.o. female with pulmonary fibrosis (amio-related?), severe pulmonary HTN, diastolic heart failure with acute exacerbation, persistent atrial fibrillation with ongoing mild hemoptysis, HTN, HLP, mild DM, anemia and history of AVMs. Plan is for right and left heart catheterization after adequate diuresis  Patient in room with respiratory therapist at bedside. Pt with codes called over the weekend. TF now restarted: VHP @ 20 ml/hr. Will monitor for plan to advance.  Patient is currently intubated on ventilator support MV: 9.3 L/min Temp (24hrs), Avg:99.7 F (37.6 C), Min:98.3 F (36.8 C), Max:100.7 F (38.2 C)  Medications: KCl solution daily Labs reviewed: CBGs: 142-152 Elevated Phos, Mg   Diet Order:  Diet NPO time specified  Skin:  Reviewed, no issues  Last BM:  4/3  Height:   Ht Readings from Last 1 Encounters:  02/26/17 5\' 6"  (1.676 m)    Weight:   Wt Readings from Last 1 Encounters:  03/01/17 216 lb 0.8 oz (98 kg)    Ideal Body Weight:  59.09 kg  BMI:  Body mass index is 34.87 kg/m.  Estimated Nutritional Needs:   Kcal:  1027-1308 (11-14 kcal/kg)  Protein:  118 grams (2 grams/kg IBW)  Fluid:  1.2-1.5 L/day  EDUCATION NEEDS:   No education needs identified at  this time  05/01/17, MS, RD, LDN Pager: [phone] After Hours Pager: [phone]

## 2017-03-01 NOTE — Progress Notes (Signed)
PULMONARY / CRITICAL CARE MEDICINE   Name: [name] MRN: [id number] DOB: 08/14/1951    ADMISSION DATE:  02/18/2017 CONSULTATION DATE:  02/26/17  REFERRING MD:  Dr. Cruzita Lederer  CHIEF COMPLAINT:  SOB  HISTORY OF PRESENT ILLNESS:   66 yo F with hx of pAfib (not on anticoag due to hx of GI bleed), pulm fibrosis (likely from [facility]), chronic hypoxic resp failure, dCHF, CKD III, presented to the hospital on 3/26 with SOB and cough. Cough x2 months with frothy sputum which became blood tinged prior to admission, with some leg edema and orthopnea. She developed Afib with RVR, CXR showed interstitial edema along with 8 lb weight increase suggestive of acute DCHF. Was put on lasix, dilt gtt and digoxin. She could not tolerate bipap, we were consulted due to hemoptysis and hypoxic resp failure.   Signed off 3/28 with plans for outpatient follow up.  Early AM 4/5, pt had V.fib arrest requiring defib x 4, epi, bicarb.  V.fib would break momentarily after defib but then recur immediately. It was not until amio was started did V.fib break fully.  She was intubated by anesthesia and PCCM to assume care.  Of note, following intubation, she had frank hemoptysis again, dark colored.  Autoimmune labs from 3/28 as follows: ESR neg, Anti Scl-70 neg, CRP elevated 19.1, dsDNA neg, CCP antibodies elevated at 36, ANCA neg, GBM Ab neg,  RF elevated at 15.7.    SUBJECTIVE:   Overnight developed atrial fibrillation with RVR Started on phenylephrine Single dose metoprolol given this am, little change in HR Rhythmic movements have stopped   VITAL SIGNS: BP 111/70   Pulse 63   Temp 100.1 F (37.8 C) (Axillary)   Resp 20   Ht _0  (1.676 m)   Wt 98 kg (216 lb 0.8 oz)   SpO2 94%   BMI 34.87 kg/m   HEMODYNAMICS: CVP:  [14 mmHg-29 mmHg] 29 mmHg  VENTILATOR SETTINGS: Vent Mode: PRVC FiO2 (%):  [40 %-50 %] 40 % Set Rate:  [20 bmp] 20 bmp Vt Set:  [480 mL] 480 mL PEEP:  [8 cmH20] 8 cmH20 Plateau Pressure:   [0 cmH20-26 cmH20] 23 cmH20  INTAKE / OUTPUT: I/O last 3 completed shifts: In: 2748 [I.V.:2328; NG/GT:270; IV Piggyback:150] Out: 1800 [Urine:1800]  PHYSICAL EXAMINATION: General: Intubated, uncomfortable appearing, rhythmic movements of her head and upper thorax when stimulated HEENT: ET tube in position, bloody secretions noted Neuro: Sedated, opens eyes to voice and stimulation, rhythmic movements with any movement or stimulation CV: Regular, tachycardic, distant heart sounds PULM: Coarse bilaterally GI: Soft, non-distended, positive bowel sounds Extremities: Bilateral lower showed trace edema,  Skin: No rash   LABS:  BMET  Recent Labs Lab 02/28/17 0340 02/28/17 1800 03/01/17 0448  NA 134* 135 138  K 4.3 5.0 4.3  CL 89* 91* 101  CO2 35* 33* 24  BUN 62* 75* 71*  CREATININE 1.74* 2.39* 2.17*  GLUCOSE 154* 156* 118*    Electrolytes  Recent Labs Lab 02/26/17 0315  02/26/17 2002  02/28/17 0340 02/28/17 1800 03/01/17 0448  CALCIUM 8.7*  < > 8.4*  < > 8.5* 8.5* 7.0*  MG 2.5*  < > 2.8*  --   --  3.1* 2.7*  PHOS 4.6  --   --   --   --   --  5.3*  < > = values in this interval not displayed.  CBC  Recent Labs Lab 02/27/17 0824 02/28/17 0745 03/01/17 0448  WBC 22.6* 20.4*  18.4*  HGB 12.6 14.2 11.7*  HCT 38.1 43.1 36.2  PLT 410* 359 327    Coag's  Recent Labs Lab 03/01/2017 0445  INR 1.15    Sepsis Markers  Recent Labs Lab 02/26/17 0315  02/26/17 1232 02/27/17 0155 02/27/17 0445 02/27/17 0824 02/28/17 0340  LATICACIDVEN  --   < > 5.7* 10.6* 7.8*  --   --   PROCALCITON 0.45  --   --   --   --  3.76 4.09  < > = values in this interval not displayed.  ABG  Recent Labs Lab 02/26/17 0300 02/27/17 0205  PHART 7.403 7.344*  PCO2ART 63.5* 43.9  PO2ART 69.3* 59.2*    Liver Enzymes  Recent Labs Lab 02/25/17 0500 02/26/17 1232  AST 76* 185*  ALT 98* 149*  ALKPHOS 196* 258*  BILITOT 0.9 1.1  ALBUMIN 2.2* 2.2*    Cardiac  Enzymes  Recent Labs Lab 02/26/17 0315 02/26/17 0900 02/26/17 1500  TROPONINI 0.04* 0.14* 0.15*    Glucose  Recent Labs Lab 02/28/17 1203 02/28/17 1543 02/28/17 1952 02/28/17 2300 03/01/17 0300 03/01/17 0837  GLUCAP 129* 181* 117* 165* 142* 152*    Imaging Dg Chest Port 1 View  Result Date: 03/01/2017 CLINICAL DATA:  Acute respiratory failure EXAM: PORTABLE CHEST 1 VIEW COMPARISON:  February 27, 2017 FINDINGS: The ETT terminates in the trachea. The right PICC line terminates in SVC. The NG tube terminates below today's film. No pneumothorax. Increasing bilateral pulmonary opacities consistent with worsening pulmonary edema. Stable cardiomegaly. No other interval changes. IMPRESSION: 1. Support apparatus in good position. 2. Worsening pulmonary edema and probable small effusions. Electronically Signed   By: Dorise Bullion III M.D   On: 03/01/2017 06:50    STUDIES:  ECHO 3/27 >>  EF 50-55%, PA pressure 70 mmHg, biatrial enlargement CXR 3/26 >> cardiomegaly, chronicfiibrotic changes on b/l bases, mild interstitial edema Chest CT 4/3 >> b/l pulm infiltrates, b/l pleural effusions L > R.  Increased mediastinal LAD.  CULTURES: BCx2 4/2 >>  BAL 4/5 >>   ANTIBIOTICS: Azithromycin 4/2 > 4/5 ceftrx 4/2 > 4/5 Vanc 4/5 > 4/5 Zosyn 4/5 > 4/5 Cefepime 4/6 >>   SIGNIFICANT EVENTS: 3/26  Admit with cough, hemoptysis, pulmonary edema in setting of AF w RVR 3/27  Multiple bolus of cardizem, Cardiology following 3/28  PCCM consulted for increased O2 needs, SOB. Signed off 3/29  diuresed well, felt slightly better, HR improved 3/30 continued to diurese well with breathing better 3/31 continued to be hypoxic requiring high flow, continued to diurese well 4/02 started on abx for leukocytosis to cover CAP 4/03 lasix increased and metolazone added 4/05 cardiac arrest >VT 4/06 Cardiac arrest > VT 4/07 rhythmic movements upper body, ? myoclonic  LINES/TUBES: ETT 4/5 >> Aline 4/6  >>  DISCUSSION: 66 y.o. F with ILD dating back to 2014, previously labelled as amio toxicity, severe pulm HTN admitted 3/26 with cough and hemoptysis, BL infiltrates, found to have AFRVR, pulmonary edema, volume overload.  Has been getting high dose lasix and diuresing well but still volume overloaded.  On 4/5 she suffered a V.fib arrest (presumed due to hypokalemia after aggressive diuresis) > intubated and PCCM called back.  Recurrent VT and difficulty with rhythm management.    ASSESSMENT / PLAN:  PULMONARY A: Acute Hypoxic Respiratory Failure - suspect multifactorial in the setting of underlying fibrosis, pulmonary edema in the setting of AFwRVR and significant right heart failure, hemoptysis. 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 Tobacco Abuse. Pulmonary HTN - PA peak 70 on ECHO  P:   Continue current ventilator strategy 8 mL/kg Wean PEEP and FiO2 as able Continue to wean Solu-Medrol in absence of wheezing 4/8 continue bronchodilators when necessary Follow chest x-ray abx as ordered  CARDIOVASCULAR A:  V.fib arrest - presumed due to hypokalemia after aggressive diuresis.  s/p defib x 4 with only brief response before recurrent V.fib;  Only broke after amio given. VT - recurrent episodes Atrial Fibrillation with RVR. Chronic Diastolic CHF - Echo from 03/24/57 with EF 50-55%, biatrial enlargement, Mod TR, PAP 70. HTN, HLD. Digoxin Toxicity  P:  Amiodarone held Lidocaine decreased 4/7, appears to have helped with rhythmic movements (question lidocaine toxicity)  Appreciate EP and cardiology assistance Cardiac catheterization on hold Followed digoxin level, improving Hold diuresis   RENAL A:   AKI on CKD III. Hypokalemia - exacerbated by aggressive  diuresis. Hyponatremia  Contraction alkalosis. P:   Follow BMP and urine output Replace electrolytes as needed, follow closely given her dysrhythmia Continue scheduled potassium replacement  GASTROINTESTINAL A:   Hx AVM of Small Bowel. Nutrition. P:   Nothing by mouth PPI for stress ulcer prophylaxis Hold tube feeds Bowel regimen  HEMATOLOGIC A:   Hx Macrocytic Anemia. VTE prophylaxis. P:  SCD in place Follow CBC  INFECTIOUS A:   Concern for HCAP - has had prolonged hospitalization. P:   Continue antibiotics as above, changed to cefepime 4/6, day 7 of 8 Follow chest x-ray, white blood cell count, temperature  ENDOCRINE A:   Borderline DM.    P:   CBG & scale insulin per protocol  NEUROLOGIC A: Acute encephalopathy - due to sedation.   Hx Schizophrenia, Anxiety / Depression. Possible mild clonus versus agitation with rhythmic intentional movements of the head and torso, possible seizures, unclear P:   RASS Goal: -2  Versed drip, fentanyl drip Formal read on EEG is still pending Daily wakeup assessment, initiate today  FAMILY  - Updates: Updated sister at bedside on 4/7.  Pt remains critically ill.  Full code.    - Inter-disciplinary family meet or Palliative Care meeting due by:  03/04/17.   Independent CC time 34 minutes  Baltazar Apo, MD, PhD 03/01/2017, 11:17 AM [facility] [phone] or if no answer [phone]

## 2017-03-01 NOTE — Progress Notes (Addendum)
Subjective:  She is sedated, intubated and asleep.  Last evening she was placed on Neo-Synephrine for some hypotension and also developed atrial fibrillation with rapid response last night.  She is currently in sinus rhythm.  The ventricular tachycardia has settled down.  Still with elevated digoxin level but coming down.  Objective:  Vital Signs in the last 24 hours: BP 127/64   Pulse 63   Temp 99.2 F (37.3 C) (Oral)   Resp (!) 22   Ht 5\' 6"  (1.676 m)   Wt 98 kg (216 lb 0.8 oz)   SpO2 93%   BMI 34.87 kg/m   Physical Exam: Intubated sedated black female in no acute distress Lungs:  Mild rhonchi Cardiac:  Regular rhythm, normal S1 and S2, no S3 Abdomen:  Soft, nontender, no masses Extremities:  1+ edema present  Intake/Output from previous day: 04/07 0701 - 04/08 0700 In: 2162 [I.V.:1792; NG/GT:270; IV Piggyback:100] Out: 1300 [Urine:1300] Weight Filed Weights   02/27/17 0500 02/28/17 0350 03/01/17 0600  Weight: 93.2 kg (205 lb 7.5 oz) 96 kg (211 lb 10.3 oz) 98 kg (216 lb 0.8 oz)    Lab Results: Basic Metabolic Panel:  Recent Labs  05/01/17 1800 03/01/17 0448  NA 135 138  K 5.0 4.3  CL 91* 101  CO2 33* 24  GLUCOSE 156* 118*  BUN 75* 71*  CREATININE 2.39* 2.17*    CBC:  Recent Labs  02/28/17 0745 03/01/17 0448  WBC 20.4* 18.4*  HGB 14.2 11.7*  HCT 43.1 36.2  MCV 82.3 79.9  PLT 359 327    BNP    Component Value Date/Time   BNP 759.3 (H) 02/21/2017 0930    Cardiac Panel (last 3 results)  Recent Labs  02/26/17 0900 02/26/17 1500  TROPONINI 0.14* 0.15*    PROTIME: Lab Results  Component Value Date   INR 1.15 02/23/2017   INR 1.15 02/24/2013   INR 1.06 12/18/2012    Telemetry: Currently sinus rhythm rate of 90.  Review of strips during the night show multiple episodes of paroxysmal atrial fibrillation with rapid ventricular response.  Assessment/Plan:  1.  Torsades de pointes's improving on low-dose lidocaine likely due to QT  prolonging agents 2.  Paroxysmal atrial fibrillation with recurrence last night 3.  Hypotension may have been related to the rapid response of atrial fibrillation 4.  Digoxin toxicity 5.  Acute renal failure-improving  Recommendations:  Discussed with Dr. 12/20/2012.  We will try to use metoprolol and try to wean the Neo-Synephrine.  Would try to support pressure with some fluid boluses since LV function is normal and if can get pressure stable than would like to put on a continual dose of metoprolol probably intravenous for the time being.  Avoid digoxin because of prior toxicity. Recheck EKG this am to look at QT.       Ladona Ridgel  MD [facility]  03/01/2017, 7:59 AM

## 2017-03-01 NOTE — Progress Notes (Signed)
eLink Physician-Brief Progress Note Patient Name: [name] DOB: 30-Dec-1950 MRN: [id number]   Date of Service  03/01/2017  HPI/Events of Note  AFIB with ventricular rate = 120's. Already on Metoprolol 2.5 mg IV Q 6 hours. BP = 105/59. K+ = 4.3 and Mg++ = 2.7.  eICU Interventions  Will order: 1. Metoprolol 2.5 mg IV X 1 now.      Intervention Category Major Interventions: Arrhythmia - evaluation and management  Sommer,Steven Eugene 03/01/2017, 3:26 PM

## 2017-03-01 NOTE — Progress Notes (Signed)
eLink Physician-Brief Progress Note Patient Name: [name] DOB: 08/27/1951 MRN: [id number]   Date of Service  03/01/2017  HPI/Events of Note  AFIB with RVR - Ventricular rate remains in the 120's after the extra dose of Metoprolol.   eICU Interventions  Will order: 1. D/C Metoprolol. 2. Esmolol IV infusion 25-100/kg/min. Titrate to HR = 65-105.     Intervention Category Major Interventions: Arrhythmia - evaluation and management  Kleber Crean Eugene 03/01/2017, 5:10 PM

## 2017-03-01 NOTE — Progress Notes (Signed)
Pt rhythm afib rvr sustaining  in the 120s and CVP of 26.  Informed Dr Hosie Poisson in the blackbox and additional dose of metoprolol ordered.  Erick Blinks, RN

## 2017-03-02 ENCOUNTER — Inpatient Hospital Stay (HOSPITAL_COMMUNITY)
Admit: 2017-03-02 | Discharge: 2017-03-02 | Disposition: A | Discharge status: Home / Self Care | Payer: Medicare Other | Attending: Emergency Medicine | Admitting: Emergency Medicine

## 2017-03-02 ENCOUNTER — Inpatient Hospital Stay (HOSPITAL_COMMUNITY): Payer: Medicare Other

## 2017-03-02 DIAGNOSIS — R092 Respiratory arrest: Secondary | ICD-10-CM

## 2017-03-02 DIAGNOSIS — N189 Chronic kidney disease, unspecified: Secondary | ICD-10-CM

## 2017-03-02 DIAGNOSIS — N289 Disorder of kidney and ureter, unspecified: Secondary | ICD-10-CM

## 2017-03-02 LAB — CBC
HCT: 34.9 % — ABNORMAL LOW (ref 36.0–46.0)
Hemoglobin: 11.1 g/dL — ABNORMAL LOW (ref 12.0–15.0)
MCH: 25.3 pg — ABNORMAL LOW (ref 26.0–34.0)
MCHC: 31.8 g/dL (ref 30.0–36.0)
MCV: 79.5 fL (ref 78.0–100.0)
PLATELETS: 312 10*3/uL (ref 150–400)
RBC: 4.39 MIL/uL (ref 3.87–5.11)
RDW: 15.2 % (ref 11.5–15.5)
WBC: 16.8 10*3/uL — ABNORMAL HIGH (ref 4.0–10.5)

## 2017-03-02 LAB — BASIC METABOLIC PANEL
ANION GAP: 7 (ref 5–15)
BUN: 84 mg/dL — ABNORMAL HIGH (ref 6–20)
CALCIUM: 7.7 mg/dL — AB (ref 8.9–10.3)
CO2: 30 mmol/L (ref 22–32)
Chloride: 103 mmol/L (ref 101–111)
Creatinine, Ser: 1.87 mg/dL — ABNORMAL HIGH (ref 0.44–1.00)
GFR, EST AFRICAN AMERICAN: 31 mL/min — AB (ref >60)
GFR, EST NON AFRICAN AMERICAN: 27 mL/min — AB (ref >60)
GLUCOSE: 156 mg/dL — AB (ref 65–99)
Potassium: 3.8 mmol/L (ref 3.5–5.1)
SODIUM: 140 mmol/L (ref 135–145)

## 2017-03-02 LAB — URINALYSIS, ROUTINE W REFLEX MICROSCOPIC
Bacteria, UA: NONE SEEN
Bilirubin Urine: NEGATIVE
Glucose, UA: NEGATIVE mg/dL
KETONES UR: NEGATIVE mg/dL
Leukocytes, UA: NEGATIVE
Nitrite: NEGATIVE
PH: 6 (ref 5.0–8.0)
PROTEIN: 30 mg/dL — AB
Specific Gravity, Urine: 1.019 (ref 1.005–1.030)

## 2017-03-02 LAB — GLUCOSE, CAPILLARY
GLUCOSE-CAPILLARY: 125 mg/dL — AB (ref 65–99)
GLUCOSE-CAPILLARY: 176 mg/dL — AB (ref 65–99)
GLUCOSE-CAPILLARY: 211 mg/dL — AB (ref 65–99)
Glucose-Capillary: 135 mg/dL — ABNORMAL HIGH (ref 65–99)
Glucose-Capillary: 111 mg/dL — ABNORMAL HIGH (ref 65–99)
Glucose-Capillary: 157 mg/dL — ABNORMAL HIGH (ref 65–99)

## 2017-03-02 LAB — MAGNESIUM: MAGNESIUM: 2.9 mg/dL — AB (ref 1.7–2.4)

## 2017-03-02 MED ORDER — DEXMEDETOMIDINE HCL IN NACL 400 MCG/100ML IV SOLN
0.4000 ug/kg/h | INTRAVENOUS | Status: DC
  Administered 2017-03-02 (×2): 0.4 ug/kg/h via INTRAVENOUS
  Administered 2017-03-03: 0.5 ug/kg/h via INTRAVENOUS
  Filled 2017-03-02 (×3): qty 100

## 2017-03-02 MED ORDER — ACETAMINOPHEN 160 MG/5ML PO SOLN
650.0000 mg | Freq: Four times a day (QID) | ORAL | Status: DC | PRN
  Administered 2017-03-02 – 2017-03-03 (×4): 650 mg
  Filled 2017-03-02 (×4): qty 20.3

## 2017-03-02 MED ORDER — PHENYLEPHRINE HCL 10 MG/ML IJ SOLN
0.0000 ug/min | INTRAMUSCULAR | Status: DC
  Administered 2017-03-02: 80 ug/min via INTRAVENOUS
  Filled 2017-03-02 (×4): qty 4

## 2017-03-02 MED ORDER — PRO-STAT SUGAR FREE PO LIQD
30.0000 mL | Freq: Two times a day (BID) | ORAL | Status: DC
  Administered 2017-03-02 – 2017-03-03 (×3): 30 mL
  Filled 2017-03-02 (×2): qty 30

## 2017-03-02 MED ORDER — DILTIAZEM HCL 30 MG PO TABS
30.0000 mg | ORAL_TABLET | Freq: Three times a day (TID) | ORAL | Status: DC
  Filled 2017-03-02: qty 1

## 2017-03-02 MED ORDER — LIP MEDEX EX OINT
TOPICAL_OINTMENT | CUTANEOUS | Status: AC
  Administered 2017-03-02: 15:00:00
  Filled 2017-03-02: qty 7

## 2017-03-02 MED ORDER — VITAL HIGH PROTEIN PO LIQD
1000.0000 mL | ORAL | Status: DC
  Administered 2017-03-02 – 2017-03-03 (×2): 1000 mL
  Filled 2017-03-02 (×2): qty 1000

## 2017-03-02 MED ORDER — DILTIAZEM 12 MG/ML ORAL SUSPENSION
30.0000 mg | Freq: Three times a day (TID) | ORAL | Status: DC
  Administered 2017-03-02 – 2017-03-03 (×3): 30 mg
  Filled 2017-03-02 (×6): qty 3

## 2017-03-02 NOTE — Progress Notes (Signed)
Nutrition Follow-up  DOCUMENTATION CODES:   Obesity unspecified  INTERVENTION:  - Will increase TF to Vital High Protein @ 30 mL/hr with 30 mL Prostat BID which will provide 920 kcal (89.5% minimum estimated kcal need), 93 grams of protein (79% estimated protein need), and 602 mL free water. - PEPuP protocol.   NUTRITION DIAGNOSIS:   Inadequate oral intake related to inability to eat as evidenced by NPO status. -ongoing  GOAL:   Provide needs based on ASPEN/SCCM guidelines -unmet at this time, will be met for kcal with change in TF regimen.   MONITOR:   Vent status, TF tolerance, Weight trends, Labs, I & O's  ASSESSMENT:   66 y.o. female with pulmonary fibrosis (amio-related?), severe pulmonary HTN, diastolic heart failure with acute exacerbation, persistent atrial fibrillation with ongoing mild hemoptysis, HTN, HLP, mild DM, anemia and history of AVMs. Plan is for right and left heart catheterization after adequate diuresis  4/9 Pt with OGT and continues on the vent. She is receiving Vital High Protein @ 20 mL/hr. Techs currently in the room to perform EEG. No family/visitors present. Weight has been trending up since 4/6; will continue to monitor and will continue with estimated needs based on admission weight. Will adjust TF regimen as outlined above to better meet needs as pt has been without sufficient nutrition since admission.   Patient is currently intubated on ventilator support MV: 12.2 L/min Temp (24hrs), Avg:100.6 F (38.1 C), Min:98.8 F (37.1 C), Max:103 F (39.4 C) Propofol: none  Medications reviewed; sliding scale Novolog, 40 mg IV Solu-medrol/day, 20 mEq KCl per OGT/day, 1 g Carafate per OGT QID.  Labs reviewed; CBGs: 125 and 135 mg/dL this AM, BUN: 84 mg/dL, creatinine: 1.87 mg/dL, Ca: 7.7 mg/dL, Mg: 2.9 mg/dL, GFR: 31 mL/min.  Drips: Lidocaine @ 0.5 mcg/min, Versed @ 4 mg/hr, Fentanyl @ 200 mcg/hr, Neo @ 90 mcg/min, Brevibloc @ 150 mcg/kg/min.    4/8 -  Patient in room with respiratory therapist at bedside.  - Pt with codes called over the weekend.  - TF now restarted: VHP @ 20 ml/hr.   Patient is currently intubated on ventilator support MV: 9.3 L/min Temp (24hrs), Avg:99.7 F (37.6 C), Min:98.3 F (36.8 C), Max:100.7 F (38.2 C) Elevated Phos, Mg     Diet Order:  Diet NPO time specified  Skin:  Reviewed, no issues  Last BM:  4/3  Height:   Ht Readings from Last 1 Encounters:  02/26/17 '5\' 6"'$  (1.676 m)    Weight:   Wt Readings from Last 1 Encounters:  03/02/17 217 lb 9.5 oz (98.7 kg)    Ideal Body Weight:  59.09 kg  BMI:  Body mass index is 35.12 kg/m.  Estimated Nutritional Needs:   Kcal:  1027-1308 (11-14 kcal/kg)  Protein:  118 grams (2 grams/kg IBW)  Fluid:  1.2-1.5 L/day  EDUCATION NEEDS:   No education needs identified at this time    Jarome Matin, MS, RD, LDN, CNSC Inpatient Clinical Dietitian Pager # [phone] After hours/weekend pager # [phone]

## 2017-03-02 NOTE — Progress Notes (Signed)
Tracheal Aspirate obtained/labelled/sent to lab per order, RN aware.

## 2017-03-02 NOTE — Progress Notes (Signed)
Progress Note  Patient Name: [name] Date of Encounter: 03/02/2017  Primary Cardiologist: Hilty  Subjective  66 yo  , admitted with respiratory failure. Long hx of Afib.    Also has interstitial lung disease  Has had a complicated hospitalization including long QT, Torsades de Pointes.   Intubated and sedated- no VT overnight  Inpatient Medications    Scheduled Meds: . ceFEPime (MAXIPIME) IV  1 g Intravenous Q12H  . chlorhexidine gluconate (MEDLINE KIT)  15 mL Mouth Rinse BID  . Chlorhexidine Gluconate Cloth  6 each Topical Daily  . feeding supplement (VITAL HIGH PROTEIN)  1,000 mL Per Tube Q24H  . insulin aspart  0-20 Units Subcutaneous Q4H  . mouth rinse  15 mL Mouth Rinse QID  . methylPREDNISolone (SOLU-MEDROL) injection  40 mg Intravenous Daily  . potassium chloride  20 mEq Per Tube Daily  . pravastatin  20 mg Per Tube Daily  . sodium chloride flush  10-40 mL Intracatheter Q12H  . sodium chloride flush  3 mL Intravenous Q12H  . sucralfate  1 g Per Tube Q6H   Continuous Infusions: . sodium chloride 10 mL/hr at 03/02/17 0800  . dexmedetomidine (PRECEDEX) IV infusion    . esmolol 150 mcg/kg/min (03/02/17 0800)  . fentaNYL infusion INTRAVENOUS 200 mcg/hr (03/02/17 0800)  . phenylephrine (NEO-SYNEPHRINE) Adult infusion 90 mcg/min (03/02/17 0800)   PRN Meds: sodium chloride, Place/Maintain arterial line **AND** sodium chloride, acetaminophen (TYLENOL) oral liquid 160 mg/5 mL, bisacodyl, fentaNYL, levalbuterol, sodium chloride, sodium chloride flush, sodium chloride flush   Vital Signs    Vitals:   03/02/17 0500 03/02/17 0600 03/02/17 0749 03/02/17 0800  BP:      Pulse:      Resp: 19 19  (!) 22  Temp:  (!) 103 F (39.4 C)  (!) 100.7 F (38.2 C)  TempSrc:  Oral  Oral  SpO2: 92% 97% 98% 99%  Weight:      Height:        Intake/Output Summary (Last 24 hours) at 03/02/17 0915 Last data filed at 03/02/17 0800  Gross per 24 hour  Intake          3032.01  ml  Output             1600 ml  Net          1432.01 ml   Filed Weights   02/28/17 0350 03/01/17 0600 03/02/17 0404  Weight: 211 lb 10.3 oz (96 kg) 216 lb 0.8 oz (98 kg) 217 lb 9.5 oz (98.7 kg)    Telemetry    AF with VR-100-120 - Personally Reviewed  ECG    03/01/17- AF with VR 122, diffuse TWI and ST depression, QTc-461, personally reviewed  Physical Exam   GEN: Ill-appearing on vent. Sedated Neck: JVD noted to angle of her jaw - markedly elevated JVD  Cardiac: irreg irreg, increased rate Respiratory: Vent noise bilaterally. GI: Soft, nontender, non-distended  MS: 1+ bilateral lower extremity edema; No deformity. Neuro: sedated, unresponsive    Labs    Chemistry Recent Labs Lab 02/25/17 0500  02/26/17 1232  02/28/17 1800 03/01/17 0448 03/02/17 0400  NA 131*  < > 134*  < > 135 138 140  K 2.9*  < > 4.2  < > 5.0 4.3 3.8  CL 78*  < > 83*  < > 91* 101 103  CO2 43*  < > 35*  < > 33* 24 30  GLUCOSE 111*  < > 185*  < >  156* 118* 156*  BUN 30*  < > 43*  < > 75* 71* 84*  CREATININE 1.13*  < > 1.98*  < > 2.39* 2.17* 1.87*  CALCIUM 8.8*  < > 8.6*  < > 8.5* 7.0* 7.7*  PROT 7.8  --  7.6  --   --   --   --   ALBUMIN 2.2*  --  2.2*  --   --   --   --   AST 76*  --  185*  --   --   --   --   ALT 98*  --  149*  --   --   --   --   ALKPHOS 196*  --  258*  --   --   --   --   BILITOT 0.9  --  1.1  --   --   --   --   GFRNONAA 50*  < > 25*  < > 20* 22* 27*  GFRAA 57*  < > 29*  < > 23* 26* 31*  ANIONGAP 10  < > 16*  < > '11 13 7  '$ < > = values in this interval not displayed.   Hematology  Recent Labs Lab 02/28/17 0745 03/01/17 0448 03/02/17 0400  WBC 20.4* 18.4* 16.8*  RBC 5.24* 4.53 4.39  HGB 14.2 11.7* 11.1*  HCT 43.1 36.2 34.9*  MCV 82.3 79.9 79.5  MCH 27.1 25.8* 25.3*  MCHC 32.9 32.3 31.8  RDW 15.3 15.3 15.2  PLT 359 327 312    Cardiac Enzymes  Recent Labs Lab 02/26/17 0315 02/26/17 0900 02/26/17 1500  TROPONINI 0.04* 0.14* 0.15*   No results for  input(s): TROPIPOC in the last 168 hours.   BNPNo results for input(s): BNP, PROBNP in the last 168 hours.   DDimer No results for input(s): DDIMER in the last 168 hours.   Radiology    Dg Chest Port 1 View  Result Date: 03/02/2017 CLINICAL DATA:  Acute onset of respiratory failure. Initial encounter. EXAM: PORTABLE CHEST 1 VIEW COMPARISON:  Chest radiograph performed 03/01/2017 FINDINGS: The patient's endotracheal tube is seen ending 6 cm above the carina. A right PICC is noted ending about the mid to distal SVC. Vascular congestion is noted. Increased interstitial markings raise concern for pulmonary edema, mildly improved from the prior study. No definite pleural effusion or pneumothorax is seen. The cardiomediastinal silhouette is mildly enlarged. No acute osseous abnormalities are identified. An external pacing pad is noted. IMPRESSION: 1. Endotracheal tube seen ending 6 cm above the carina. 2. Vascular congestion and mild cardiomegaly. Increased interstitial markings raise concern for pulmonary edema, mildly improved from the prior study. Electronically Signed   By: Garald Balding M.D.   On: 03/02/2017 04:12   Dg Chest Port 1 View  Result Date: 03/01/2017 CLINICAL DATA:  Acute respiratory failure EXAM: PORTABLE CHEST 1 VIEW COMPARISON:  February 27, 2017 FINDINGS: The ETT terminates in the trachea. The right PICC line terminates in SVC. The NG tube terminates below today's film. No pneumothorax. Increasing bilateral pulmonary opacities consistent with worsening pulmonary edema. Stable cardiomegaly. No other interval changes. IMPRESSION: 1. Support apparatus in good position. 2. Worsening pulmonary edema and probable small effusions. Electronically Signed   By: Dorise Bullion III M.D   On: 03/01/2017 06:50    Cardiac Studies   Echocardiogram 02/17/17: - Left ventricle: The cavity size was normal. Wall thickness was   normal. Systolic function was normal. The estimated ejection   fraction  was in  the range of 50% to 55%. Wall motion was normal;   there were no regional wall motion abnormalities. - Ventricular septum: The contour showed diastolic flattening and   systolic flattening. - Left atrium: The atrium was moderately dilated. - Right ventricle: The cavity size was mildly dilated. Systolic   function was moderately reduced. - Right atrium: The atrium was moderately dilated. - Tricuspid valve: There was moderate regurgitation. - Pulmonary arteries: Systolic pressure was severely increased. PA   peak pressure: 70 mm Hg (S). - Pericardium, extracardiac: A small pericardial effusion was   identified.  Impressions:  - Technically difficult; normal LV systolic function; D shaped   septum suggestive of pulmonary hypertension; biatrial   enlargement; mild RVE with moderately reduced RV function;   moderate TR with severely elevated pulmonary pressure; small   pericardial effusion.  Patient Profile     66 y.o. female Critically ill with right ventricular failure, severe pulmonary hypertension, atrial fibrillation with RVR, with cardiac arrest, ventricular fibrillation 2 on 02/26/17, requiring CPRand defibrillations as well multiple antiarrhythmics.   Assessment & Plan     Ventricular fibrillation/ Torsade   - Underlying structural heart disease, RV failure. Torsade secondary to prolonged QTc-Amiodarone discontinued, Digibind given for Dig toxicity, electrolytes repleted, and antibiotics adjusted. Her QTc has improved and V ectopy has decreased. She remains in AF with RVR. IV Esmolol started last night. She is still in IV Lidocaine. See Dr Tanna Furry note 02/28/17.    Acute combined systolic and diastolic CHF  - I/O neg 3.8H so far  Pulmonary fibrosis  - Both left heart disease as well as pulmonary fibrosis are playing a role in her mixed pulmonary hypertension.  - Amiodarone was presumed previously to be because for her pulmonary fibrosis however in review of CT scans prior to  amiodarone initiation she did show evidence of fibrotic changes, perhaps from her rheumatoid arthritis/autoimmune condition.  Afib  -CHA2DS2/VAS Stroke Risk Score 4 (HTN, CHF, age, female) Unable to anticoagulate with ongoing hemoptysis (and hx of anemia due to GI bleeding and pt refuses).  Transient CHB and junctional rhythm.  - This has resolved off Amiodarone.    Plan- Dr Lovena Le had recommended stopping Lidocaine- will do so this am. I have contacted EP service, they will review chart later today and make any recommendations they feel they need to.     Signed, Kerin Ransom, PA-C  03/02/2017, 9:15 AM     Attending Note:   The patient was seen and examined.  Agree with assessment and plan as noted above.  Changes made to the above note as needed.  Patient seen and independently examined with Kerin Ransom, PA .   We discussed all aspects of the encounter. I agree with the assessment and plan as stated above.  1.  Atrial fibrillation with rapid ventricular response: She is on max dose Esmolol drip. Will try adding low dose diltiazem per NG   2. Pulmonary HTN:   Has PA pressures of 70 by echo.  She has known interstitial lung disease.  Has marked JVD  I/O are negative 9 liters this admission. Further plans per PCCM.     I have spent a total of 30 minutes with patient reviewing hospital  notes , telemetry, EKGs, labs and examining patient as well as establishing an assessment and plan that was discussed with the patient. > 50% of time was spent in direct patient care.     Thayer Headings, Brooke Bonito., MD, [facility] 03/02/2017,  11:26 AM [address]  [place] Pager [phone]

## 2017-03-02 NOTE — Procedures (Signed)
ELECTROENCEPHALOGRAM REPORT  Date of Study: 03/02/2017  Patient's Name: [name] MRN: [id number] Date of Birth: 06-30-1951  Referring Provider: Levy Pupa, MD  Clinical History: 66 year old woman with atrial fibrillation, pulmonary fibrosis and acute on chronic CHF, intubated and sedated status post respiratory arrest.  Medications: acetaminophen (TYLENOL)bisacodyl (DULCOLAX)  ceFEPIme (MAXIPIME)IVPBdexmedetomidine (PRECEDEX) esmolol (BREVIBLOC)  fentaNYL (SUBLIMAZE)  insulin aspart (novoLOG) injection 0-20 Units  methylPREDNISolone sodium succinate (SOLU-MEDROL) 40 mg/mL injection 40 mg  pravastatin (PRAVACHOL) tablet 20 mg  Technical Summary: A multichannel digital EEG recording measured by the international 10-20 system with electrodes applied with paste and impedances below 5000 ohms performed in our laboratory with EKG monitoring in an intubated and sedated patient.  Hyperventilation and photic stimulation were not performed.  The digital EEG was referentially recorded, reformatted, and digitally filtered in a variety of bipolar and referential montages for optimal display.    Description: The patient is intubated and sedated with Precidex and Fentanyl sedation during the recording. The background is symmetric with diffuse slowing of low amplitude 2-3 Hz delta with some mild 4-5 Hz theta activity.  There is no discernible posterior dominant rhythm.  There is no spontaneous reactivity or reactivity noted with noxious stimulation. There is no muscle artifact over the frontal leads. There were no epileptiform discharges or electrographic seizures seen.   EKG lead was unremarkable.  Impression: This EEG is markedly abnormal due to diffuse background slowing.  Clinical Correlation of the above findings indicates severe diffuse cerebral dysfunction that is non-specific in etiology and can be seen in the setting of anoxic/ischemic injury, toxic/metabolic encephalopathies, or  pharmacologic effect from sedative medication.  If clinically indicated, consider repeat EEG off of sedation.  Shon Millet, DO

## 2017-03-02 NOTE — Progress Notes (Signed)
eLink Physician-Brief Progress Note Patient Name: [name] DOB: 1951-05-18 MRN: [id number]   Date of Service  03/02/2017  HPI/Events of Note  T 101.8 Pt already on cefepime No diarrhea or seceretions  eICU Interventions  Check cultures Tylenol PRN     Intervention Category Intermediate Interventions: Infection - evaluation and management  Jayquan Bradsher 03/02/2017, 3:27 AM

## 2017-03-02 NOTE — Progress Notes (Signed)
Pharmacy - Brief Note  Patient on phenylephrine gtt with increased requirements.  RN asked to [place].    Plan: - Phenylephrine now quad strength (40mg  per bag).  Nursing aware to adjust pump settings for new concentration.   , PharmD, BCPS.   Pager: Juliette Alcide 03/02/2017 10:20 AM

## 2017-03-02 NOTE — Progress Notes (Signed)
PULMONARY / CRITICAL CARE MEDICINE   Name: MONSERATH NEFF MRN: [id number] DOB: [birth year]    ADMISSION DATE:  01/29/2017 CONSULTATION DATE:  02/26/17  REFERRING MD:  Dr. Cruzita Lederer  CHIEF COMPLAINT:  SOB  HISTORY OF PRESENT ILLNESS:   66 yo F with hx of pAfib (not on anticoag due to hx of GI bleed), pulm fibrosis (likely from [facility]), chronic hypoxic resp failure, dCHF, CKD III, presented to the hospital on 3/26 with SOB and cough. Cough x2 months with frothy sputum which became blood tinged prior to admission, with some leg edema and orthopnea. She developed Afib with RVR, CXR showed interstitial edema along with 8 lb weight increase suggestive of acute DCHF. Was put on lasix, dilt gtt and digoxin. She could not tolerate bipap, we were consulted due to hemoptysis and hypoxic resp failure.   Signed off 3/28 with plans for outpatient follow up.  Early AM 4/5, pt had V.fib arrest requiring defib x 4, epi, bicarb.  V.fib would break momentarily after defib but then recur immediately. It was not until amio was started did V.fib break fully.  She was intubated by anesthesia and PCCM to assume care.  Of note, following intubation, she had frank hemoptysis again, dark colored.  Autoimmune labs from 3/28 as follows: ESR neg, Anti Scl-70 neg, CRP elevated 19.1, dsDNA neg, CCP antibodies elevated at 36, ANCA neg, GBM Ab neg,  RF elevated at 15.7.    SUBJECTIVE:   Heavily sedated    VITAL SIGNS: BP (!) 88/50   Pulse 63   Temp (!) 103 F (39.4 C) (Oral) Comment: ice applied  Resp 19   Ht _0  (1.676 m)   Wt 217 lb 9.5 oz (98.7 kg)   SpO2 98%   BMI 35.12 kg/m   HEMODYNAMICS: CVP:  [21 mmHg-29 mmHg] 22 mmHg  VENTILATOR SETTINGS: Vent Mode: PRVC FiO2 (%):  [40 %] 40 % Set Rate:  [20 bmp] 20 bmp Vt Set:  [480 mL] 480 mL PEEP:  [8 cmH20] 8 cmH20 Plateau Pressure:  [21 cmH20-26 cmH20] 21 cmH20  INTAKE / OUTPUT:  Intake/Output Summary (Last 24 hours) at 03/02/17 0826 Last data filed at  03/02/17 0600  Gross per 24 hour  Intake          3171.01 ml  Output             1400 ml  Net          1771.01 ml     General appearance: 66  Year old  female, well nourished heavily sedated Eyes: anicteric sclerae , moist conjunctivae; PERRL, EOMI bilaterally. Mouth:  membranes and no mucosal ulcerations; normal hard and soft palate, orally intubated Neck: Trachea midline; neck supple,MARKED JVD Lungs/chest: basilar rales.  with normal respiratory effort and no intercostal retractions CV: very irregular irreg, no MRGs  Abdomen: Soft, non-tender; no masses or HSM Extremities: diffuse peripheral edema Skin: Normal temperature, turgor and texture; no rash, ulcers or subcutaneous nodules Neuro/Psych: sedated currently   LABS:  BMET  Recent Labs Lab 02/28/17 1800 03/01/17 0448 03/02/17 0400  NA 135 138 140  K 5.0 4.3 3.8  CL 91* 101 103  CO2 33* 24 30  BUN 75* 71* 84*  CREATININE 2.39* 2.17* 1.87*  GLUCOSE 156* 118* 156*    Electrolytes  Recent Labs Lab 02/26/17 0315  02/28/17 1800 03/01/17 0448 03/02/17 0400  CALCIUM 8.7*  < > 8.5* 7.0* 7.7*  MG 2.5*  < > 3.1* 2.7* 2.9*  PHOS  4.6  --   --  5.3*  --   < > = values in this interval not displayed.  CBC  Recent Labs Lab 02/28/17 0745 03/01/17 0448 03/02/17 0400  WBC 20.4* 18.4* 16.8*  HGB 14.2 11.7* 11.1*  HCT 43.1 36.2 34.9*  PLT 359 327 312    Coag's  Recent Labs Lab 03/18/2017 0445  INR 1.15    Sepsis Markers  Recent Labs Lab 02/26/17 0315  02/26/17 1232 02/27/17 0155 02/27/17 0445 02/27/17 0824 02/28/17 0340  LATICACIDVEN  --   < > 5.7* 10.6* 7.8*  --   --   PROCALCITON 0.45  --   --   --   --  3.76 4.09  < > = values in this interval not displayed.  ABG  Recent Labs Lab 02/26/17 0300 02/27/17 0205  PHART 7.403 7.344*  PCO2ART 63.5* 43.9  PO2ART 69.3* 59.2*    Liver Enzymes  Recent Labs Lab 02/25/17 0500 02/26/17 1232  AST 76* 185*  ALT 98* 149*  ALKPHOS 196* 258*   BILITOT 0.9 1.1  ALBUMIN 2.2* 2.2*    Cardiac Enzymes  Recent Labs Lab 02/26/17 0315 02/26/17 0900 02/26/17 1500  TROPONINI 0.04* 0.14* 0.15*    Glucose  Recent Labs Lab 03/01/17 1234 03/01/17 1516 03/01/17 1923 03/01/17 2312 03/02/17 0316 03/02/17 0746  GLUCAP 131* 132* 99 154* 125* 135*    Imaging Dg Chest Port 1 View  Result Date: 03/02/2017 CLINICAL DATA:  Acute onset of respiratory failure. Initial encounter. EXAM: PORTABLE CHEST 1 VIEW COMPARISON:  Chest radiograph performed 03/01/2017 FINDINGS: The patient's endotracheal tube is seen ending 6 cm above the carina. A right PICC is noted ending about the mid to distal SVC. Vascular congestion is noted. Increased interstitial markings raise concern for pulmonary edema, mildly improved from the prior study. No definite pleural effusion or pneumothorax is seen. The cardiomediastinal silhouette is mildly enlarged. No acute osseous abnormalities are identified. An external pacing pad is noted. IMPRESSION: 1. Endotracheal tube seen ending 6 cm above the carina. 2. Vascular congestion and mild cardiomegaly. Increased interstitial markings raise concern for pulmonary edema, mildly improved from the prior study. Electronically Signed   By: Garald Balding M.D.   On: 03/02/2017 04:12  personally reviewed CXR 4/9: rotated film, support lines and tubes good position. Improved aeration   STUDIES:  ECHO 3/27 >>  EF 50-55%, PA pressure 70 mmHg, biatrial enlargement CXR 3/26 >> cardiomegaly, chronicfiibrotic changes on b/l bases, mild interstitial edema Chest CT 4/3 >> b/l pulm infiltrates, b/l pleural effusions L > R.  Increased mediastinal LAD.  CULTURES: BCx2 4/2 >> neg BAL 4/5 >> neg Sputum 4/9>>> BCX2 4/9>>>  ANTIBIOTICS: Azithromycin 4/2 > 4/5 ceftrx 4/2 > 4/5 Vanc 4/5 > 4/5 Zosyn 4/5 > 4/5 Cefepime 4/6 >>   SIGNIFICANT EVENTS: 3/26  Admit with cough, hemoptysis, pulmonary edema in setting of AF w RVR 3/27  Multiple  bolus of cardizem, Cardiology following 3/28  PCCM consulted for increased O2 needs, SOB. Signed off 3/29  diuresed well, felt slightly better, HR improved 3/30 continued to diurese well with breathing better 3/31 continued to be hypoxic requiring high flow, continued to diurese well 4/02 started on abx for leukocytosis to cover CAP 4/03 lasix increased and metolazone added 4/05 cardiac arrest >VT 4/06 Cardiac arrest > VT 4/07 rhythmic movements upper body, ? Myoclonic vs lido tox?? 4/9 febrile, cultures sent; esmolol gtt started for RVR  LINES/TUBES: ETT 4/5 >> Aline 4/6 >>   ASSESSMENT /  PLAN:  PULMONARY Acute Hypoxic Respiratory Failure - suspect multifactorial in the setting of underlying fibrosis, pulmonary edema in the setting of AFwRVR and significant right heart failure, hemoptysis. 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 Tobacco Abuse. Pulmonary HTN - PA peak 70 on ECHO  ->4/9: improved aeration on CXR.  ->she's  9 liters negative since admit but appears still volume overloaded.  Plan Cont full vent support w/ daily assessment for weaning Wean solumedrol  Aim for even to negative volume status. Will re-assess diuretic regimen over next 24 hrs RAS goal 0 to -1; will try precedex gtt.    CARDIOVASCULAR V.fib arrest - presumed due to hypokalemia after aggressive diuresis.  s/p defib x 4 with only brief response before recurrent V.fib;  Only broke after amio given. VT/ Torsades - recurrent episodes Atrial Fibrillation with RVR. Chronic Diastolic CHF - Echo from 9/67/89 with EF 50-55%, biatrial enlargement, Mod TR, PAP 70. Hypotension (4/8) h/o HTN, HLD. Digoxin Toxicity   Plan Avoiding dig Cont BB gtt (Esmolol gtt) ? Wean lido off??? Avoiding Qtc prolonging agents & checking  QTc daily  Neo for MAP > 65 Not on AC d/t h/o GIB Stop nicotine  RENAL AKI on CKD III. Hypermagnesemia and hyperphosphatemia  Plan Even to negative volume status f/u am chem  GASTROINTESTINAL Hx AVM of Small Bowel. Elevated LFTs Nutrition. Plan Cont tube feeds carafate for SUP f/u LFTs am  HEMATOLOGIC Hx Macrocytic Anemia. VTE prophylaxis. Plan Trend cbc PAS to LEs  INFECTIOUS Concern for HCAP - has had prolonged hospitalization. ->CXR improved but spiking temps Plan No change in abx day 5/x cefepime  f/u cultures   ENDOCRINE Borderline DM.    Plan ssi   NEUROLOGIC Acute encephalopathy - due to sedation.   Hx Schizophrenia, Anxiety / Depression. Possible mild clonus versus agitation with rhythmic intentional movements of the head and torso, possible seizures, unclear-->this has resolved and was likely d/t lidocaine tox.  Plan Transition to precedex PAD protocol   FAMILY  - Updates: Updated sister at bedside on 4/7.  Pt remains critically ill.  Full code.    - Inter-disciplinary family meet or Palliative Care meeting due by:  03/04/17.  Discussion 66 year old female w/ sig h/o ILD dating back to 2014 (etiology unclear as labeled as amio but dx predated amio exposure; also ? Of Rheumatoid related IPF. Admitted w/ what was initially felt pulmonary edema. Superimposed on chronic IPF. Course complicated by cardiac arrest (VF) on 4/5 felt precipitated by hypokaliemia, intubated and since having difficulty w/ torsades as well as prolonged QTc. As of 3/10 CXR w/ improved aeration. Did spike fever last night but cultures have been sent.  Still having trouble w/ rate control Still appears volume overloaded but w/ improving CXR no rush to push diuretics.  -focus today is to switch sedation regimen to see of we can assess for weaning AND try to get better control of her AF.   My critical care time  45 minutes.   Erick Colace ACNP-BC [place] Pager # [phone] OR # [phone] if no answer  Attending Note:  I have examined patient, reviewed labs, studies and notes. I have discussed the case with Jerrye Bushy, and I agree with the data and plans as amended above. 66 year old  woman with a history of interstitial lung disease, question amiodarone lung toxicity (unclear), rheumatoid disease. Admitted with acute on chronic pulmonary infiltrates thought to be diastolic CHF in the setting of A. fib plus RVR. Course complicated by VT VF arrest and associated respiratory failure. Etiology of the arrest unclear-consider electrolyte abnormality, consider medications. Her QTC has improved. She is now off lidocaine. She had atrial fibrillation with RVR that prompted initiation of esmolol gtt overnight. She is currently on phenylephrine 90. On my evaluation she is heavily sedated. She does not exhibit any further evidence of myoclonic jerking. She barely opens eyes to stimulation. Intubated and ventilated. Lungs are coarse bilaterally. Heart is tachycardic in the 120s, irregular. Abdomen benign. I reviewed the case with Dr. Acie Fredrickson with cardiology. Plan concentrate on rate control. She is a poor candidate for amiodarone now given her associated dysrhythmia. Continue current ventilatory support. We will wean steroids to off by 4/10. Complete therapy for possible healthcare associated pneumonia. Independent critical care time is 35 minutes.   Baltazar Apo, MD, PhD 03/02/2017, 11:17 AM [facility] [phone] or if no answer [phone]

## 2017-03-02 NOTE — Progress Notes (Signed)
EEG Completed; Results Pending  

## 2017-03-02 NOTE — Progress Notes (Signed)
Wasted 40 ml of  Versed in sink with Heloise Purpura, RN.

## 2017-03-03 DIAGNOSIS — R740 Nonspecific elevation of levels of transaminase and lactic acid dehydrogenase [LDH]: Secondary | ICD-10-CM

## 2017-03-03 LAB — COMPREHENSIVE METABOLIC PANEL
ALBUMIN: 2.1 g/dL — AB (ref 3.5–5.0)
ALT: 1698 U/L — AB (ref 14–54)
AST: 2902 U/L — AB (ref 15–41)
Alkaline Phosphatase: 165 U/L — ABNORMAL HIGH (ref 38–126)
Anion gap: 6 (ref 5–15)
BUN: 85 mg/dL — ABNORMAL HIGH (ref 6–20)
CALCIUM: 7.9 mg/dL — AB (ref 8.9–10.3)
CO2: 31 mmol/L (ref 22–32)
Chloride: 105 mmol/L (ref 101–111)
Creatinine, Ser: 1.73 mg/dL — ABNORMAL HIGH (ref 0.44–1.00)
GFR calc Af Amer: 34 mL/min — ABNORMAL LOW (ref >60)
GFR calc non Af Amer: 30 mL/min — ABNORMAL LOW (ref >60)
GLUCOSE: 202 mg/dL — AB (ref 65–99)
POTASSIUM: 3.5 mmol/L (ref 3.5–5.1)
Sodium: 142 mmol/L (ref 135–145)
TOTAL PROTEIN: 6.5 g/dL (ref 6.5–8.1)
Total Bilirubin: 1.6 mg/dL — ABNORMAL HIGH (ref 0.3–1.2)

## 2017-03-03 LAB — CBC
HCT: 36.6 % (ref 36.0–46.0)
HEMOGLOBIN: 11.9 g/dL — AB (ref 12.0–15.0)
MCH: 25.5 pg — ABNORMAL LOW (ref 26.0–34.0)
MCHC: 32.5 g/dL (ref 30.0–36.0)
MCV: 78.5 fL (ref 78.0–100.0)
Platelets: 218 10*3/uL (ref 150–400)
RBC: 4.66 MIL/uL (ref 3.87–5.11)
RDW: 15.8 % — ABNORMAL HIGH (ref 11.5–15.5)
WBC: 15.4 10*3/uL — ABNORMAL HIGH (ref 4.0–10.5)

## 2017-03-03 LAB — BLOOD GAS, ARTERIAL
ACID-BASE DEFICIT: 6 mmol/L — AB (ref 0.0–2.0)
BICARBONATE: 16.6 mmol/L — AB (ref 20.0–28.0)
Drawn by: 295301
FIO2: 40
LHR: 20 {breaths}/min
O2 SAT: 94.1 %
PATIENT TEMPERATURE: 98.6
PCO2 ART: 26.2 mmHg — AB (ref 32.0–48.0)
PEEP: 5 cmH2O
VT: 480 mL
pH, Arterial: 7.417 (ref 7.350–7.450)
pO2, Arterial: 79.3 mmHg — ABNORMAL LOW (ref 83.0–108.0)

## 2017-03-03 LAB — GLUCOSE, CAPILLARY
Glucose-Capillary: 184 mg/dL — ABNORMAL HIGH (ref 65–99)
Glucose-Capillary: 212 mg/dL — ABNORMAL HIGH (ref 65–99)
Glucose-Capillary: 208 mg/dL — ABNORMAL HIGH (ref 65–99)

## 2017-03-03 LAB — AMMONIA: AMMONIA: 33 umol/L (ref 9–35)

## 2017-03-03 LAB — PROTIME-INR
INR: 2.46
Prothrombin Time: 27.1 seconds — ABNORMAL HIGH (ref 11.4–15.2)

## 2017-03-03 LAB — PHOSPHORUS: PHOSPHORUS: 2.3 mg/dL — AB (ref 2.5–4.6)

## 2017-03-03 MED ORDER — NOREPINEPHRINE BITARTRATE 1 MG/ML IV SOLN
0.0000 ug/min | INTRAVENOUS | Status: DC
  Administered 2017-03-03: 30 ug/min via INTRAVENOUS
  Filled 2017-03-03 (×2): qty 4

## 2017-03-03 MED ORDER — EPINEPHRINE PF 1 MG/ML IJ SOLN
0.5000 ug/min | INTRAVENOUS | Status: DC
  Filled 2017-03-03 (×2): qty 4

## 2017-03-03 MED ORDER — FENTANYL CITRATE (PF) 100 MCG/2ML IJ SOLN: 12.5000 ug | INTRAMUSCULAR | Status: DC | PRN

## 2017-03-03 MED ORDER — POTASSIUM CHLORIDE 20 MEQ/15ML (10%) PO SOLN
40.0000 meq | Freq: Once | ORAL | Status: AC
  Administered 2017-03-03: 40 meq
  Filled 2017-03-03: qty 30

## 2017-03-03 MED ORDER — PREDNISONE 5 MG/5ML PO SOLN
40.0000 mg | Freq: Every day | ORAL | Status: DC
  Administered 2017-03-03: 40 mg
  Filled 2017-03-03: qty 40

## 2017-03-03 MED ORDER — POTASSIUM PHOSPHATES 15 MMOLE/5ML IV SOLN
20.0000 mmol | Freq: Once | INTRAVENOUS | Status: AC
  Administered 2017-03-03: 20 mmol via INTRAVENOUS
  Filled 2017-03-03: qty 6.67

## 2017-03-03 MED ORDER — METOPROLOL TARTRATE 25 MG/10 ML ORAL SUSPENSION
25.0000 mg | Freq: Two times a day (BID) | ORAL | Status: DC
  Administered 2017-03-03: 25 mg
  Filled 2017-03-03 (×2): qty 10

## 2017-03-03 MED ORDER — FUROSEMIDE 10 MG/ML IJ SOLN
40.0000 mg | Freq: Once | INTRAMUSCULAR | Status: AC
  Administered 2017-03-03: 40 mg via INTRAVENOUS
  Filled 2017-03-03: qty 4

## 2017-03-03 MED ORDER — VASOPRESSIN 20 UNIT/ML IV SOLN
0.0300 [IU]/min | INTRAVENOUS | Status: DC
  Filled 2017-03-03: qty 2

## 2017-03-03 MED FILL — Medication: Qty: 1 | Status: AC

## 2017-03-04 ENCOUNTER — Inpatient Hospital Stay: Payer: Medicare Other | Admitting: Acute Care

## 2017-03-04 LAB — CULTURE, RESPIRATORY W GRAM STAIN

## 2017-03-04 LAB — CULTURE, RESPIRATORY

## 2017-03-07 LAB — CULTURE, BLOOD (ROUTINE X 2)
Culture: NO GROWTH
Culture: NO GROWTH
SPECIAL REQUESTS: ADEQUATE
Special Requests: ADEQUATE

## 2017-03-11 ENCOUNTER — Telehealth: Payer: Self-pay

## 2017-03-11 NOTE — Telephone Encounter (Signed)
On 03/11/17 I received a death certificate from [facility] (original). The death certificate is for cremation. The patient is a patient of Doctor Byrum. The death certificate will be taken to [place] ([address] this pm for signature.  On 2017-03-21 I received the death certificate back from Doctor Byrum. I got the death certificate ready and called the funeral home to let them know the death certificate is ready for pickup. I also faxed a copy to the funeral home per the funeral home request.

## 2017-03-24 NOTE — Progress Notes (Signed)
Date:  March 03, 2017 Chart reviewed for concurrent status and case management needs. Will continue to follow patient progress./iv diltiazem gtt/ iv lasix, iv digoxin/remains on vent support Discharge Planning: following for needs Expected discharge date: [id number] Marcelle Smiling, BSN, [place], [place]   [phone]

## 2017-03-24 NOTE — Progress Notes (Signed)
Progress Note  Patient Name: [name] Date of Encounter: Mar 25, 2017  Primary Cardiologist: Dr Debara Pickett  Subjective   Intubated, sedated  Inpatient Medications    Scheduled Meds: . ceFEPime (MAXIPIME) IV  1 g Intravenous Q12H  . chlorhexidine gluconate (MEDLINE KIT)  15 mL Mouth Rinse BID  . Chlorhexidine Gluconate Cloth  6 each Topical Daily  . diltiazem  30 mg Per Tube Q8H  . feeding supplement (PRO-STAT SUGAR FREE 64)  30 mL Per Tube BID  . feeding supplement (VITAL HIGH PROTEIN)  1,000 mL Per Tube Q24H  . insulin aspart  0-20 Units Subcutaneous Q4H  . mouth rinse  15 mL Mouth Rinse QID  . methylPREDNISolone (SOLU-MEDROL) injection  40 mg Intravenous Daily  . potassium chloride  20 mEq Per Tube Daily  . pravastatin  20 mg Per Tube Daily  . sodium chloride flush  10-40 mL Intracatheter Q12H  . sodium chloride flush  3 mL Intravenous Q12H  . sucralfate  1 g Per Tube Q6H   Continuous Infusions: . sodium chloride 10 mL/hr at 25-Mar-2017 0800  . dexmedetomidine (PRECEDEX) IV infusion 0.498 mcg/kg/hr (03-25-17 0800)  . esmolol 125 mcg/kg/min (25-Mar-2017 0800)  . fentaNYL infusion INTRAVENOUS 75 mcg/hr (03-25-17 0900)  . phenylephrine (NEO-SYNEPHRINE) Adult infusion 10 mcg/min (Mar 25, 2017 0800)   PRN Meds: sodium chloride, Place/Maintain arterial line **AND** sodium chloride, acetaminophen (TYLENOL) oral liquid 160 mg/5 mL, bisacodyl, fentaNYL, levalbuterol, sodium chloride, sodium chloride flush, sodium chloride flush   Vital Signs    Vitals:   2017-03-25 0500 Mar 25, 2017 0600 Mar 25, 2017 0732 25-Mar-2017 0800  BP:      Pulse:      Resp: _0 Temp:    99.8 F (37.7 C)  TempSrc:    Axillary  SpO2: 98% 99% 97% 98%  Weight:      Height:        Intake/Output Summary (Last 24 hours) at 03-25-17 0903 Last data filed at 03/25/17 0800  Gross per 24 hour  Intake          3925.63 ml  Output             2170 ml  Net          1755.63 ml   Filed Weights   03/01/17 0600  03/02/17 0404 03-25-17 0442  Weight: 216 lb 0.8 oz (98 kg) 217 lb 9.5 oz (98.7 kg) 220 lb 7.4 oz (100 kg)    Telemetry    AF with VR- 90-110 - Personally Reviewed  ECG    03/02/17-AF with inferior/anterior ST depression QTc 366- Personally Reviewed  Physical Exam   GEN: Intubated Neck: JVD noted Cardiac: irreg, irreg Respiratory: Clear to auscultation bilaterally. GI: Soft, nontender, non-distended  MS: No edema; No deformity. Neuro: sedated  Labs    Chemistry Recent Labs Lab 02/25/17 0500  02/26/17 1232  03/01/17 0448 03/02/17 0400 Mar 25, 2017 0450  NA 131*  < > 134*  < > 138 140 142  K 2.9*  < > 4.2  < > 4.3 3.8 3.5  CL 78*  < > 83*  < > 101 103 105  CO2 43*  < > 35*  < > _1 GLUCOSE 111*  < > 185*  < > 118* 156* 202*  BUN 30*  < > 43*  < > 71* 84* 85*  CREATININE 1.13*  < > 1.98*  < > 2.17* 1.87* 1.73*  CALCIUM 8.8*  < > 8.6*  < >  7.0* 7.7* 7.9*  PROT 7.8  --  7.6  --   --   --  6.5  ALBUMIN 2.2*  --  2.2*  --   --   --  2.1*  AST 76*  --  185*  --   --   --  2,902*  ALT 98*  --  149*  --   --   --  1,698*  ALKPHOS 196*  --  258*  --   --   --  165*  BILITOT 0.9  --  1.1  --   --   --  1.6*  GFRNONAA 50*  < > 25*  < > 22* 27* 30*  GFRAA 57*  < > 29*  < > 26* 31* 34*  ANIONGAP 10  < > 16*  < > _0 < > = values in this interval not displayed.   Hematology Recent Labs Lab 03/01/17 0448 03/02/17 0400 10-Mar-2017 0450  WBC 18.4* 16.8* 15.4*  RBC 4.53 4.39 4.66  HGB 11.7* 11.1* 11.9*  HCT 36.2 34.9* 36.6  MCV 79.9 79.5 78.5  MCH 25.8* 25.3* 25.5*  MCHC 32.3 31.8 32.5  RDW 15.3 15.2 15.8*  PLT 327 312 218    Cardiac Enzymes Recent Labs Lab 02/26/17 0315 02/26/17 0900 02/26/17 1500  TROPONINI 0.04* 0.14* 0.15*   No results for input(s): TROPIPOC in the last 168 hours.   BNPNo results for input(s): BNP, PROBNP in the last 168 hours.   DDimer No results for input(s): DDIMER in the last 168 hours.   Radiology    Dg Chest Port 1  View  Result Date: 03/02/2017 CLINICAL DATA:  Acute onset of respiratory failure. Initial encounter. EXAM: PORTABLE CHEST 1 VIEW COMPARISON:  Chest radiograph performed 03/01/2017 FINDINGS: The patient's endotracheal tube is seen ending 6 cm above the carina. A right PICC is noted ending about the mid to distal SVC. Vascular congestion is noted. Increased interstitial markings raise concern for pulmonary edema, mildly improved from the prior study. No definite pleural effusion or pneumothorax is seen. The cardiomediastinal silhouette is mildly enlarged. No acute osseous abnormalities are identified. An external pacing pad is noted. IMPRESSION: 1. Endotracheal tube seen ending 6 cm above the carina. 2. Vascular congestion and mild cardiomegaly. Increased interstitial markings raise concern for pulmonary edema, mildly improved from the prior study. Electronically Signed   By: Garald Balding M.D.   On: 03/02/2017 04:12    Cardiac Studies   Echo 02/17/17 Impressions:  - Technically difficult; normal LV systolic function; D shaped   septum suggestive of pulmonary hypertension; biatrial   enlargement; mild RVE with moderately reduced RV function;   moderate TR with severely elevated pulmonary pressure; small   pericardial effusion.  Patient Profile     66 y.o. female, critically ill with right ventricular failure, severe pulmonary hypertension, atrial fibrillation with RVR, s/p cardiac arrest, ventricular fibrillation 2 on 02/26/17, requiring CPR and defibrillations as well multiple antiarrhythmics with CHB and Torade with Amiodarone/digoxin/ antibiotics secondary to long QT  Assessment & Plan    Ventricular fibrillation/ Torsade   - Underlying structural heart disease, RV failure. Torsade secondary to prolonged QTc-Amiodarone discontinued, Digibind given for Dig toxicity, electrolytes repleted, and antibiotics adjusted. Her QTc has improved and V ectopy has decreased. She remains in AF with RVR. IV  Esmolol started last night. She is still in IV Lidocaine. See Dr Tanna Furry note 02/28/17.   Acute combined systolic and diastolic CHF  - I/O has  drifted up last 48 hrs  Pulmonary fibrosis  - Both left heart disease as well as pulmonary fibrosis are playing a role in her mixed pulmonary hypertension. PA pressure 70 mmHg on echo.   - Amiodarone was presumed previously to be because for her pulmonary fibrosis however in review of CT scans prior to amiodarone initiation she did show evidence of fibrotic changes, perhaps from her rheumatoid arthritis/autoimmune condition.  Afib  - Rate under fair control with IV Esmolol and Diltiazem 30 mg Q8 PRN -CHA2DS2/VAS Stroke Risk Score 4 (HTN, CHF, age, female) Unable to anticoagulate with history of recent hemoptysis (and hx of anemia due to GI bleeding and pt refuses).  Transient CHB and junctional rhythm.  - This has resolved off Amiodarone.   Renal insufficiency - We had originally planned to do a Rt heart cath early last week but this was placed on hold secondary to unstable arrhythmias.   Plan- Will discuss with MD- Now that her ventricular arrhythmia is stable we are back to the problem of Rt heart failure-felt to be exacerbated by AF, and renal insufficiency. At some point will need to consider resuming diuretics.   Anticoagulation for AF was not started last week secondary to anemia, hemoptysis last week, H/O GI bleed, and pt's historical refusal.   Signed, Kerin Ransom, PA-C  Mar 15, 2017, 9:03 AM    Attending Note:   The patient was seen and examined.  Agree with assessment and plan as noted above.  Changes made to the above note as needed.  Patient seen and independently examined with Kerin Ransom, PA .   We discussed all aspects of the encounter. I agree with the assessment and plan as stated above.  1. Atrial fib:  Rate is well controlled  Continue current meds   2.  Severe pulmonary HTN:   She has evidence of RV failure.  At this  point, I do not think she is a candidate for advanced therapies for pulmonary HTN.   It seems to be mostly due to her interstitial lung disease. She is currently being weaned.  Will discuss the idea of right heart cath with PCCM but at present, I dont think it will add much .     I have spent a total of 40 minutes with patient reviewing hospital  notes , telemetry, EKGs, labs and examining patient as well as establishing an assessment and plan that was discussed with the patient. > 50% of time was spent in direct patient care.    Thayer Headings, Brooke Bonito., MD, [facility] 2017/03/15, 11:51 AM [address]  [place] Pager [phone]

## 2017-03-24 NOTE — Progress Notes (Addendum)
Interval hx  Pt developed brady arrhythmia earlier this afternoon.  -precedex had been off X 2 hrs -bb gtt stopped.  -levophed gtt started.   In spite of this the pt developed PEA arrest. She would respond briefly to epinephrine push but in spite of epi, levophed & neo gtt she would oly respond to brief epinephrine surge. This would be followed by brief tachycardia and then BP would drift down again over minutes.   We provided ACLS a total of 4 times. Each time she had ROSC w/in 2 minutes but as soon as the epinephrine bolus wore down she would again develop PEA.   Her family was summoned to the room. Her daughter requested that we no longer "put her through this".  She was made DNR at 1520.  She then again went into PEA arrest w/ family at bedside.  Family offered support. .   Impression  Refractory Cardiogenic shock w/ biventricular failure ultimately leading to cardiac arrest.   Time of death: 1534  Simonne Martinet ACNP-BC [facility] Pulmonary/Critical Care Pager # [phone] OR # [phone] if no answer

## 2017-03-24 NOTE — Progress Notes (Signed)
Chaplain providing support with extended family around pt code.  Present wit family as NP and RN report patient prognosis.  Providing support with pt's daughter in distress.  Other daughter is calling in siblings to be present at hospital in order to make decisions about [name]'s care.  She understands and is telling family that Valentina Gu may not survive the day.

## 2017-03-24 NOTE — Progress Notes (Signed)
Pharmacy Antibiotic Note  [name] is a 66 y.o. female admitted on 02/14/2017 with sepsis  Pharmacy has been consulted for cefepime dosing for HAP.  AKI and SCr worsening today warranted dose adjustment.  Today, Mar 27, 2017 Day #6 antibiotics  Renal: SCr improving  WBC slowly improving  Remains febrile  Plan:  1) Continue cefepime 1g IV q12h as renal function improves 2) continue to monitor renal function 3) Follow length of therapy and evaluate ability to stop cefepime soon  Height: _0  (167.6 cm) Weight: 220 lb 7.4 oz (100 kg) IBW/kg (Calculated) : 59.3  Temp (24hrs), Avg:101.4 F (38.6 C), Min:99.8 F (37.7 C), Max:103.1 F (39.5 C)   Recent Labs Lab 02/26/17 0318 02/26/17 0700  02/26/17 1232  02/27/17 0155 02/27/17 0445 02/27/17 0824  02/28/17 0340 02/28/17 0745 02/28/17 1800 03/01/17 0448 03/02/17 0400 03/27/2017 0450  WBC  --   --   --  25.7*  --   --   --  22.6*  --   --  20.4*  --  18.4* 16.8* 15.4*  CREATININE  --   --   < > 1.98*  < >  --   --  2.44*  < > 1.74*  --  2.39* 2.17* 1.87* 1.73*  LATICACIDVEN 10.0* 5.2*  --  5.7*  --  10.6* 7.8*  --   --   --   --   --   --   --   --   < > = values in this interval not displayed.  Estimated Creatinine Clearance: 38.2 mL/min (A) (by C-G formula based on SCr of 1.73 mg/dL (H)).    Allergies  Allergen Reactions  . Ativan [Lorazepam] Other (See Comments)    Ativan has opposite effect. She becomes agitated and confused.   . Levaquin [Levofloxacin] Hives  . Penicillins Swelling    Has patient had a PCN reaction causing immediate rash, facial/tongue/throat swelling, SOB or lightheadedness with hypotension yes Has patient had a PCN reaction causing severe rash involving mucus membranes or skin necrosis: unknown Has patient had a PCN reaction that required hospitalization yes Has patient had a PCN reaction occurring within the last 10 years: no If all of the above answers are "NO", then may proceed with  Cephalosporin use.     Antimicrobials this admission: 4/2 Zithromax/Rocephin >> 4/5 4/5 cefepime >> 4/5 vancomycin >> 4/5  Dose adjustments this admission: 4/7: due to improved SCr, changed cefepime from 1g q24 to 1g q12  Microbiology results: 4/5 BAL cx: ngtd 4/2 BCx: ngtd 3/26 MRSA PCR: neg 4/9 resp cx:(GS = GPC pairs) Cx pending 4/9 BCx: pending  Thank you for allowing pharmacy to be a part of this patient's care.  Doreene Eland, PharmD, BCPS.   Pager: [phone] 03-27-17 8:38 AM

## 2017-03-24 NOTE — Progress Notes (Signed)
PULMONARY / CRITICAL CARE MEDICINE   Name: [name] MRN: [id number] DOB: April 28, 1951    ADMISSION DATE:  01/30/2017 CONSULTATION DATE:  02/26/17  REFERRING MD:  Dr. 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 Cough x2 months with frothy sputum which became blood tinged prior to admission, with some leg edema and orthopnea. She developed Afib with RVR, CXR showed interstitial edema along with 8 lb weight increase suggestive of acute DCHF. Was put on lasix, dilt gtt and digoxin. She could not tolerate bipap, we were consulted due to hemoptysis and hypoxic resp failure.   Signed off 3/28 with plans for outpatient follow up.  Early AM 4/5, pt had V.fib arrest requiring defib x 4, epi, bicarb.  V.fib would break momentarily after defib but then recur immediately. It was not until amio was started did V.fib break fully.  She was intubated by anesthesia and PCCM to assume care.  Of note, following intubation, she had frank hemoptysis again, dark colored.  Autoimmune labs from 3/28 as follows: ESR neg, Anti Scl-70 neg, CRP elevated 19.1, dsDNA neg, CCP antibodies elevated at 36, ANCA neg, GBM Ab neg,  RF elevated at 15.7.   SUBJECTIVE:   Very sedated today    VITAL SIGNS: BP (!) 88/50   Pulse 63   Temp 99.8 F (37.7 C) (Axillary)   Resp 19   Ht _0  (1.676 m)   Wt 220 lb 7.4 oz (100 kg)   SpO2 98%   BMI 35.58 kg/m   HEMODYNAMICS: CVP:  [18 mmHg] 18 mmHg  VENTILATOR SETTINGS: Vent Mode: PRVC FiO2 (%):  [40 %] 40 % Set Rate:  [20 bmp] 20 bmp Vt Set:  [480 mL] 480 mL PEEP:  [5 cmH20-8 cmH20] 5 cmH20 Plateau Pressure:  [14 cmH20-23 cmH20] 23 cmH20  INTAKE / OUTPUT:  Intake/Output Summary (Last 24 hours) at 2017/03/13 0917 Last data filed at 03-13-2017 0800  Gross  per 24 hour  Intake          3925.63 ml  Output             2140 ml  Net          1785.63 ml   General appearance:  66 Year old  Sedated on vent Eyes: anicteric sclerae, moist conjunctivae; PERRL, EOMI bilaterally. Mouth:  membranes and no mucosal ulcerations; normal hard and soft palate, orally intubated Neck: Trachea midline; neck supple, MARKED JVD Lungs/chest: scattered rhonchi with normal respiratory effort and no intercostal retractions CV: irreg irreg w/ CVR, no MRGs  Abdomen: Soft, non-tender; no masses or HSM Extremities: + peripheral edema Skin: Normal temperature, turgor and texture; no rash, ulcers or subcutaneous nodules Neuro/Psych: heavily sedated LABS:  BMET  Recent Labs Lab 03/01/17 0448 03/02/17 0400 2017-03-13 0450  NA 138 140 142  K 4.3 3.8 3.5  CL 101 103 105  CO2 _1 BUN 71* 84* 85*  CREATININE 2.17* 1.87* 1.73*  GLUCOSE 118* 156* 202*    Electrolytes  Recent Labs Lab 02/26/17 0315  02/28/17 1800 03/01/17 0448 03/02/17 0400 03-13-2017 0450  CALCIUM 8.7*  < > 8.5* 7.0* 7.7* 7.9*  MG 2.5*  < > 3.1* 2.7* 2.9*  --   PHOS 4.6  --   --  5.3*  --  2.3*  < > = values in this interval not displayed.  CBC  Recent Labs Lab 03/01/17 0448 03/02/17 0400 03/11/2017 0450  WBC 18.4* 16.8* 15.4*  HGB 11.7* 11.1* 11.9*  HCT 36.2 34.9* 36.6  PLT 327 312 218    Coag's No results for input(s): APTT, INR in the last 168 hours.  Sepsis Markers  Recent Labs Lab 02/26/17 0315  02/26/17 1232 02/27/17 0155 02/27/17 0445 02/27/17 0824 02/28/17 0340  LATICACIDVEN  --   < > 5.7* 10.6* 7.8*  --   --   PROCALCITON 0.45  --   --   --   --  3.76 4.09  < > = values in this interval not displayed.  ABG  Recent Labs Lab 02/26/17 0300 02/27/17 0205  PHART 7.403 7.344*  PCO2ART 63.5* 43.9  PO2ART 69.3* 59.2*    Liver Enzymes  Recent Labs Lab 02/25/17 0500 02/26/17 1232 03-11-2017 0450  AST 76* 185* 2,902*  ALT 98* 149* 1,698*  ALKPHOS 196*  258* 165*  BILITOT 0.9 1.1 1.6*  ALBUMIN 2.2* 2.2* 2.1*    Cardiac Enzymes  Recent Labs Lab 02/26/17 0315 02/26/17 0900 02/26/17 1500  TROPONINI 0.04* 0.14* 0.15*    Glucose  Recent Labs Lab 03/02/17 1122 03/02/17 1555 03/02/17 1943 03/02/17 2351 Mar 11, 2017 0408 2017/03/11 0806  GLUCAP 111* 157* 176* 211* 184* 212*    Imaging No results found.personally reviewed CXR 4/9: rotated film, support lines and tubes good position. Improved aeration   STUDIES:  ECHO 3/27 >>  EF 50-55%, PA pressure 70 mmHg, biatrial enlargement CXR 3/26 >> cardiomegaly, chronicfiibrotic changes on b/l bases, mild interstitial edema Chest CT 4/3 >> b/l pulm infiltrates, b/l pleural effusions L > R.  Increased mediastinal LAD. 4/ 9 abd US>>>  CULTURES: BCx2 4/2 >> neg BAL 4/5 >> neg Sputum 4/9>>> BCX2 4/9>>>  ANTIBIOTICS: Azithromycin 4/2 > 4/5 ceftrx 4/2 > 4/5 Vanc 4/5 > 4/5 Zosyn 4/5 > 4/5 Cefepime 4/6 >>   SIGNIFICANT EVENTS: 3/26  Admit with cough, hemoptysis, pulmonary edema in setting of AF w RVR 3/27  Multiple bolus of cardizem, Cardiology following 3/28  PCCM consulted for increased O2 needs, SOB. Signed off 3/29  diuresed well, felt slightly better, HR improved 3/30 continued to diurese well with breathing better 3/31 continued to be hypoxic requiring high flow, continued to diurese well 4/02 started on abx for leukocytosis to cover CAP 4/03 lasix increased and metolazone added 4/05 cardiac arrest >VT 4/06 Cardiac arrest > VT 4/07 rhythmic movements upper body, ? Myoclonic vs lido tox?? 4/9 febrile, cultures sent; esmolol gtt started for RVR precedex started 4/10 LFTs (AST 185 -->2902) (ALT 149 -->1698) (Alk PO4 258 -->165)  (total bili 1.1-->1.6); CCB started. Rate better. Stopped fent gtt. Statin stopped.    LINES/TUBES: ETT 4/5 >> Aline 4/6 >>   ASSESSMENT / PLAN: Resolved diagnosis: Vfib arrest presumed due to hypokalemia after aggressive diuresis.  s/p defib x 4  with only brief response before recurrent V.fib;  Only broke after amio given. VT/torsades Possible mild clonus versus agitation with rhythmic intentional movements of the head and torso, possible seizures, unclear-->this has resolved and was likely d/t lidocaine tox.    Current issues  PULMONARY Acute Hypoxic Respiratory Failure - suspect multifactorial in the setting of underlying fibrosis, pulmonary edema in the setting of AFwRVR and significant right heart failure, hemoptysis. 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 Also +/- Rheumatoid (RF elevated at 15.7) Tobacco Abuse. Pulmonary HTN - PA peak 70 on ECHO  ->4/9: improved aeration on CXR.  ->to sedated to wean Plan Dc fent gtt Cont precedex for RAS 0 to -1 Reassess for weaning daily  Lasix daily (adding back)  CARDIOVASCULAR Atrial Fibrillation with RVR. Chronic Diastolic CHF - Echo from 4/69/62 with EF 50-55%, biatrial enlargement, Mod TR, PAP 70. Hypotension (4/8) h/o HTN, HLD. Digoxin Toxicity  4/10 rate much better controlled w/ addition of CCB Plan Cont CCB Cont esmolol and add scheduled BB via tube in effort to wean off.  Lasix (as above) Cont tele   RENAL AKI on CKD III-->renal fxn stable  Total body overload hypophosphatemia  Plan Lasix  Repeat am chemistry Strict I&O  GASTROINTESTINAL Hx AVM of Small Bowel. Elevated LFTs, these have markedly elevated since 4/5 Nutrition. Plan Dc statin  Get abd [place] Ck ammonia and INR  HEMATOLOGIC Hx Macrocytic Anemia. VTE prophylaxis. Plan Trend cbc  Cont PAS  INFECTIOUS Concern for HCAP - has had prolonged hospitalization. ->CXR improved but spiking temps ->prelim sputum from 4/9 growing GPC pairs.  Plan Cont day 6/x cefepime f/u pending cultures  ENDOCRINE Borderline DM.    Plan ssi   NEUROLOGIC Acute encephalopathy - due to sedation.   Hx Schizophrenia, Anxiety /  Depression.  Plan Dc fent gtt Cont precedex Check ammonia   FAMILY  - Updates: Updated sister at bedside on 4/7.  Pt remains critically ill.  Full code.    - Inter-disciplinary family meet or Palliative Care meeting due by:  03/04/17.  Discussion 43 yer old female w/ h/o ILD (? Rheumatoid related vs amio). Admitted w/ acute hypoxic resp failure d/t pulmonary edema. Course has been c/b VF arrest VT and torsades  And electrolyte disorders felt d/t aggressive diuresis which may have contributed to the above related arrest.  Her largest barrier to weaning has been AF w/ RVR and sedation.  Her rate is now better controlled on CCB and esmolol.  Of interest her LFTs have skyrocketed since 4/5. ? Drug related vs heart failure and venous congestion?? For today -add back daily lasix -check abd [place]  -ck ammonia and INR -dc fent gtt -dc statin -RAS goal 0 -reassess for readiness to wean.   My critical care time 45 minutes  Erick Colace ACNP-BC [place] Pager # [phone] OR # [phone] if no answer   Attending Note:  I have examined patient, reviewed labs, studies and notes. I have discussed the case with Jerrye Bushy, and I agree with the data and plans as amended above. 66 year old woman with a history of ILD (rheumatoid disease, amiodarone lung toxicity?), Atrial fibrillation, diastolic CHF. She was admitted with acute on chronic respiratory failure thought to be related to acute on chronic cardiogenic pulmonary edema. Aggressively diuresed, coarse then complicated by VT/VF arrest that resulted in respiratory failure and mechanical ventilation. She had further episodes of VF even after her potassium and electrolytes were corrected. Subsequently had third-degree heart block on amiodarone. Lidocaine was used, now discontinued. Most significant issue has been continued atrial fibrillation with RVR. Currently on esmolol + received diltiazem this morning. Notably her liver  function tests show significant progressive transaminitis this morning as well. Etiology for this unclear. On my evaluation she is sedated, intubated. No evidence of myoclonus. Heart rate is improved now in the 80s, irregular. Lungs coarse bilaterally. Abdomen benign. We will continue her current efforts at rate control.  Possibly wean down beta blocker over the next 24 hours. Appreciate cardiology's assistance. She is not candidate for amiodarone. We will check an abdominal ultrasound given her transaminitis, check for coagulopathy, DC statin. Continue to wean corticosteroids. Restart gentle diuresis and follow electrolytes very closely. Push toward spontaneous breathing trials as she wakes up. Independent critical care time is 40 minutes.   Baltazar Apo, MD, PhD Mar 24, 2017, 10:24 AM [facility] [phone] or if no answer [phone]

## 2017-03-24 NOTE — Progress Notes (Signed)
Nutrition Follow-up  DOCUMENTATION CODES:   Obesity unspecified  INTERVENTION:  - Continue Vital High Protein @ 30 mL/hr with 30 mL Prostat BID which will provide 920 kcal (89.5% minimum estimated kcal need), 93 grams of protein (79% estimated protein need), and 602 mL free water. - Continue PEPuP protocol.  - Will monitor for ability to wean and if unable to wean/extubate, will monitor for ability to advance TF rate.   NUTRITION DIAGNOSIS:   Inadequate oral intake related to inability to eat as evidenced by NPO status. -ongoing  GOAL:   Provide needs based on ASPEN/SCCM guidelines -met for kcal only at this time  MONITOR:   Vent status, TF tolerance, Weight trends, Labs, I & O's  ASSESSMENT:   66 y.o. female with pulmonary fibrosis (amio-related?), severe pulmonary HTN, diastolic heart failure with acute exacerbation, persistent atrial fibrillation with ongoing mild hemoptysis, HTN, HLP, mild DM, anemia and history of AVMs. Plan is for right and left heart catheterization after adequate diuresis  4/10 Pt with OGT and continues to receive TF as ordered yesterday: Vital High Protein @ 30 mL/hr with 30 mL Prostat BID. Spoke with RN yesterday and again today and informed her today of no plan to change TF regimen at this time. She reports hopeful ability to begin weaning from vent today. Weight +2.3 kg from yesterday; continue to use admission weight in estimating needs. Noted that Lasix to be added back today. Reviewed note from yesterday afternoon outlining EEG results. Pt with severely elevated LFTs and plan for abdominal US and checking ammonia and INR.   Patient is currently intubated on ventilator support MV: 11.7 L/min Temp (24hrs), Avg:101.4 F (38.6 C), Min:99.8 F (37.7 C), Max:103.1 F (39.5 C) BP: 111/60 and MAP: 72  Medications reviewed; 40 mg IV Lasix x1 dose today, sliding scale Novolog, 20 mEq KCl per OGT/day, 40 mEq KCl per OGT x1 dose today, 20 mmol IV KPhos x1 run  today, 40 mg Prednisone per OGT/day, 1 g Carafate per OGT QID. Labs reviewed; CBGs: 184 and 212 mg/dL this AM, BUN: 85 mg/dL, creatinine: 1.73 mg/dL, Ca: 7.9 mg/dL, Phos: 2.3 mg/dL, LFTs elevated, GFR: 34 mg/dL.  Drips: Neo @ 1 mcg/min, Precedex @ 0.4 mcg/kg/hr, Fentanyl @ 75 mcg/hr, Brevibloc @ 125 mcg/kg/min.    4/9 - Pt with OGT and continues on the vent.  - She is receiving Vital High Protein @ 20 mL/hr.  - Techs currently in the room to perform EEG.  - No family/visitors present.  - Weight has been trending up since 4/6.  - Will adjust TF regimen to better meet needs as pt has been without sufficient nutrition since admission.   Patient is currently intubated on ventilator support MV: 12.2 L/min Temp (24hrs), Avg:100.6 F (38.1 C), Min:98.8 F (37.1 C), Max:103 F (39.4 C) Propofol: none Mg: 2.9 mg/dL Drips: Lidocaine @ 0.5 mcg/min, Versed @ 4 mg/hr, Fentanyl @ 200 mcg/hr, Neo @ 90 mcg/min, Brevibloc @ 150 mcg/kg/min.    4/8 - Patient in room with respiratory therapist at bedside.  - Pt with codes called over the weekend.  - TF now restarted: VHP @ 20 ml/hr.   Patient is currently intubated on ventilator support MV: 9.3L/min Temp (24hrs), Avg:99.7 F (37.6 C), Min:98.3 F (36.8 C), Max:100.7 F (38.2 C) Elevated Phos, Mg   Diet Order:  Diet NPO time specified  Skin:  Reviewed, no issues  Last BM:  4/3  Height:   Ht Readings from Last 1 Encounters:  02/26/17  $'5\' 6"'W$  (1.676 m)    Weight:   Wt Readings from Last 1 Encounters:  2017-03-09 220 lb 7.4 oz (100 kg)    Ideal Body Weight:  59.09 kg  BMI:  Body mass index is 35.58 kg/m.  Estimated Nutritional Needs:   Kcal:  1027-1308 (11-14 kcal/kg)  Protein:  118 grams (2 grams/kg IBW)  Fluid:  1.2-1.5 L/day  EDUCATION NEEDS:   No education needs identified at this time    Jarome Matin, MS, RD, LDN, CNSC Inpatient Clinical Dietitian Pager # [phone] After hours/weekend pager #  [phone]

## 2017-03-24 NOTE — Progress Notes (Signed)
Providing grief support with family after pt death.  Extended family gathered in ICU waiting area.

## 2017-03-24 DEATH — deceased

## 2017-04-24 NOTE — Discharge Summary (Signed)
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 Possible amiodarone lung toxicity (doubt) Atrial fibrillation with rapid ventricular response Acute on chronic diastolic CHF Ventricular tachycardia/ventricular fibrillation arrest Third-degree A-V heart block (on amiodarone) Possible healthcare associated pneumonia Cardiogenic shock Clonus, suspected lidocaine toxicity History of tobacco abuse, possible COPD Pulmonary hypertension Digitoxin toxicity History of hypertension History of hyperlipidemia Acute on chronic(stage III) renal failure Anasarca Hypophosphatemia History of small bowel AVM Anemia of chronic disease, macrocytic anemia Acute transaminitis, etiology unclear Diabetes mellitus History of schizophrenia History of anxiety/depression Acute toxic metabolic encephalopathy   HISTORY OF PRESENT ILLNESS / HOSPITAL COURSE:   66 year old woman with a history of ILD (rheumatoid disease, possible amiodarone lung toxicity?), Atrial fibrillation, diastolic CHF. She was admitted with acute on chronic respiratory failure thought to be related to acute on chronic cardiogenic pulmonary edema. Aggressively diuresed, course then complicated by VT/VF arrest that resulted in respiratory failure and mechanical ventilation. She had further episodes of VF even after her potassium and electrolytes were corrected. Subsequently had third-degree heart block on amiodarone. Lidocaine was used, then able to be discontinued.  Treated empirically for possible healthcare associated pneumonia, noted to have gram-positive cocci in sputum. Also received empiric corticosteroids  for possible interstitial lung disease flare. She experienced continued atrial fibrillation with RVR. Treated with esmolol + received diltiazem. Notably her liver function tests show significant progressive transaminitis as well. Etiology for this unclear. On 2017-03-21 she began to experience recurrent bradycardic arrhythmias. Her beta blocker was stopped and she was started on Levothroid for involving associated shock. This evolved to a PEA arrest. She underwent ACLS but was unable to be stabilized. Decision was ultimately made to discontinue CPR attempts. She expired March 21, 2017.   Levy Pupa, MD, PhD 03/25/2017, 7:50 AM [facility] [phone] or if no answer [phone]

## 2017-05-27 IMAGING — CT CT CHEST W/O CM
2 of 4 series · 15 of 36 positions shown, 18 images · non-contrast
Comparison: 10/07/2015 CT chest.  Chest radiograph 02/23/2017

CLINICAL DATA: Pulmonary fibrosis, hypertension, GERD, atrial
fibrillation, chronic diastolic CHF, chronic kidney disease stage
III

EXAM:
CT CHEST WITHOUT CONTRAST
TECHNIQUE: Multidetector CT imaging of the chest was performed following the
standard protocol without IV contrast. Sagittal and coronal MPR
images reconstructed from axial data set.

[Series 2: thorax · axial · 0.62mm/px · z∈[-299,-63]mm · 12 of 138 slices shown, 15 images]
[im 10/138  mediastinal]
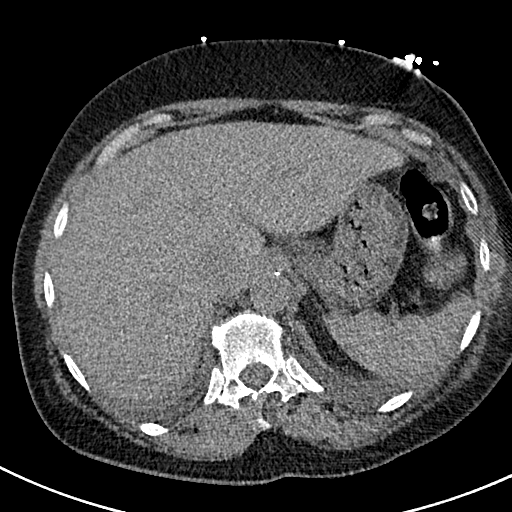
[im 10/138  lung]
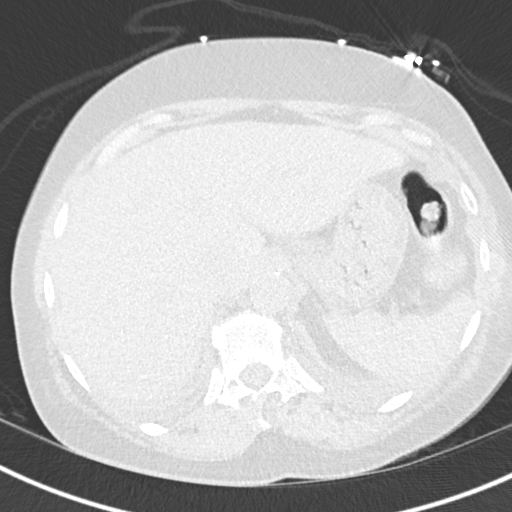
[im 20/138  lung]
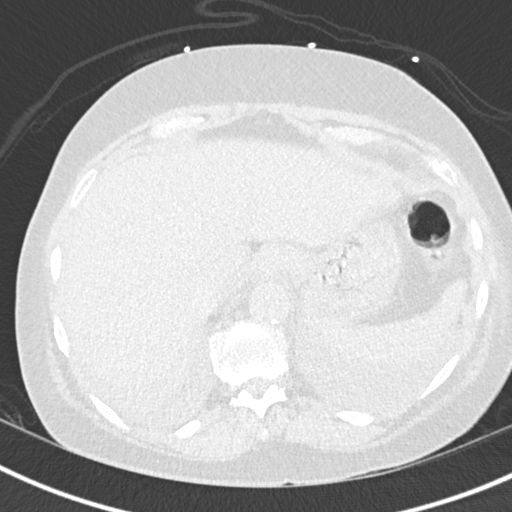
[im 30/138  lung]
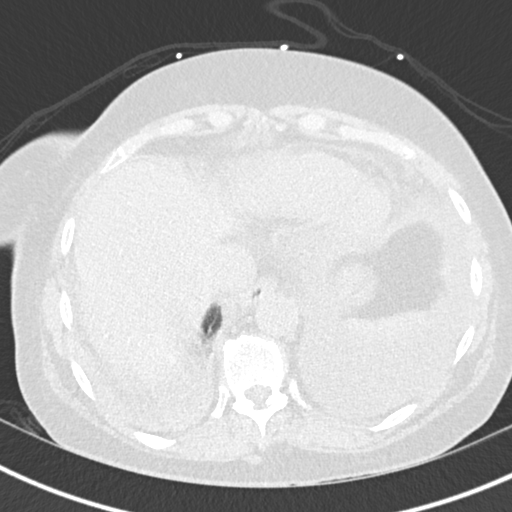
[im 40/138  lung]
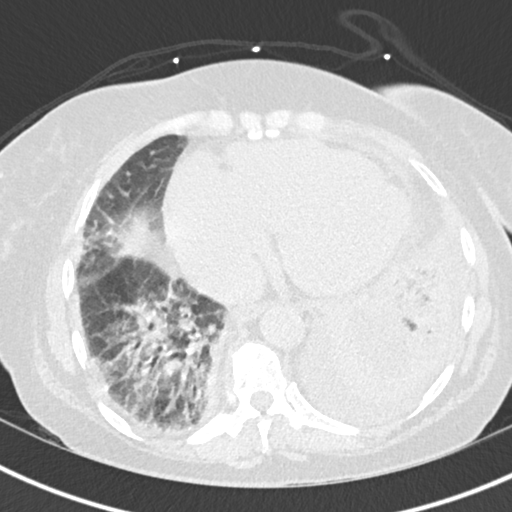
[im 49/138  mediastinal]
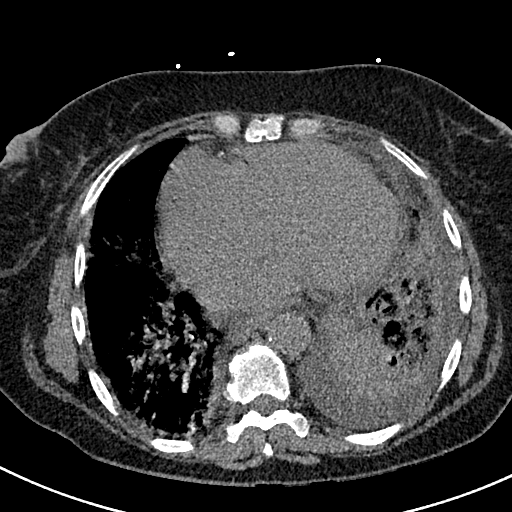
[im 49/138  lung]
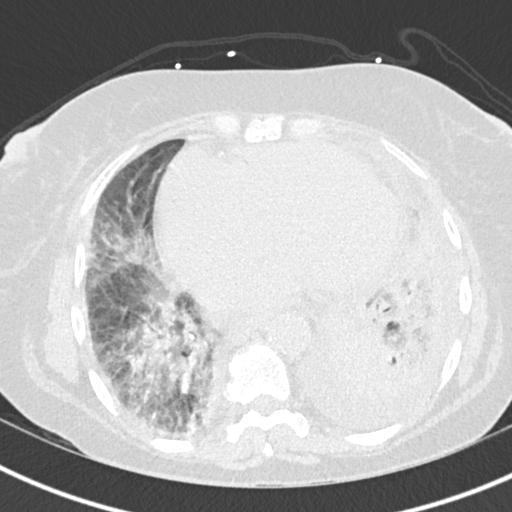
[im 59/138  lung]
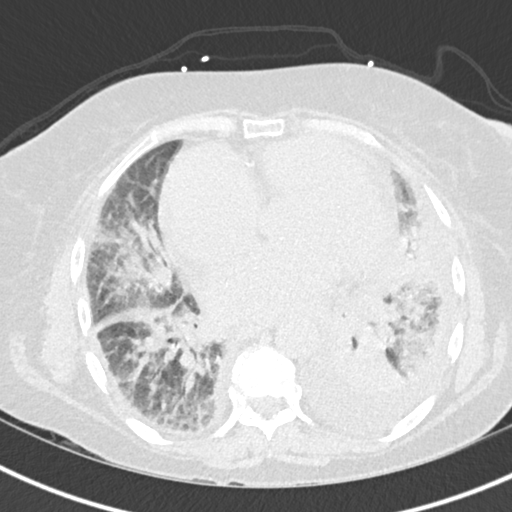
[im 79/138  lung]
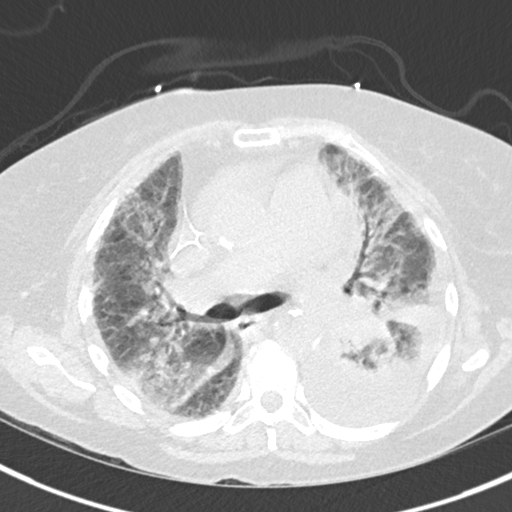
[im 89/138  lung]
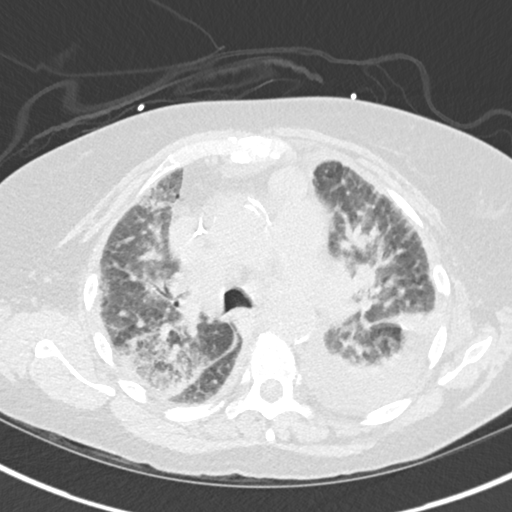
[im 98/138  mediastinal]
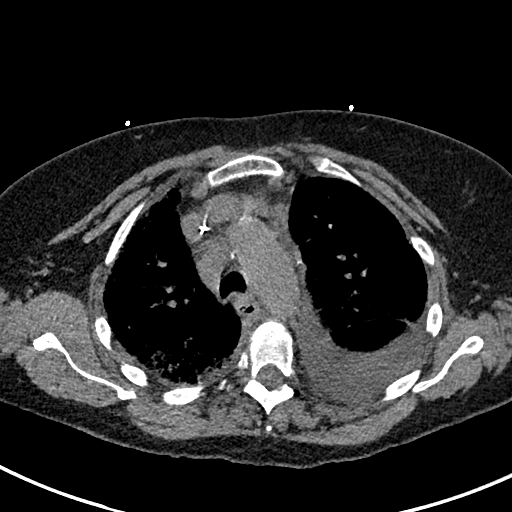
[im 98/138  lung]
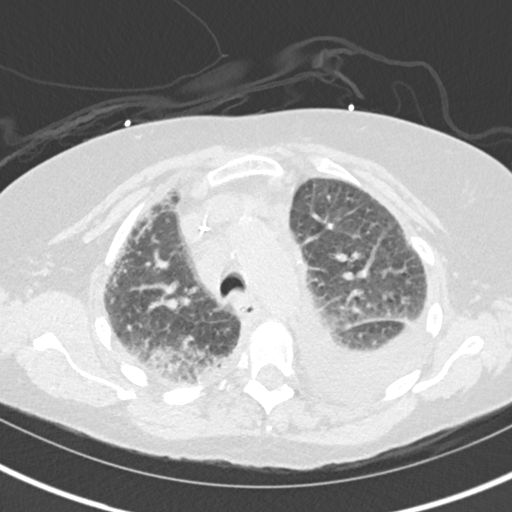
[im 108/138  lung]
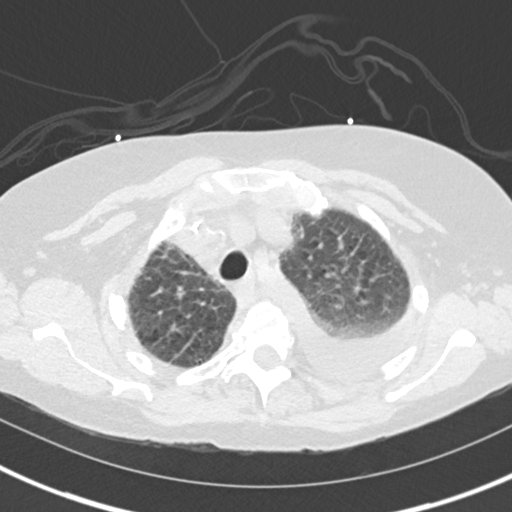
[im 118/138  lung]
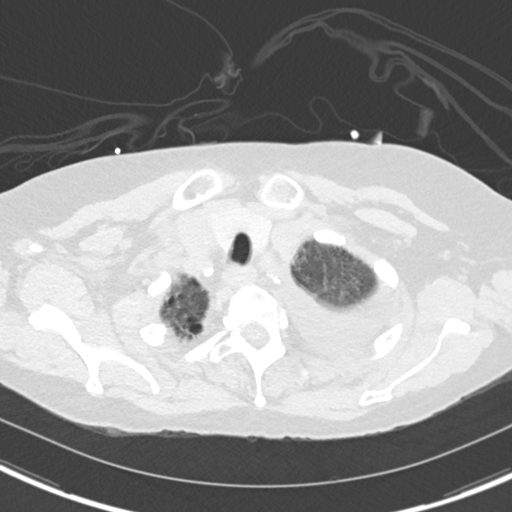
[im 128/138  lung]
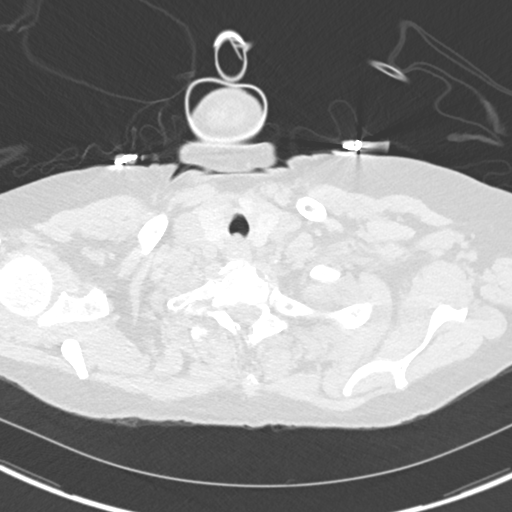

[Series 6: coronal · coronal · 0.59mm/px · 3 of 129 slices shown]
[im 26/129  lung]
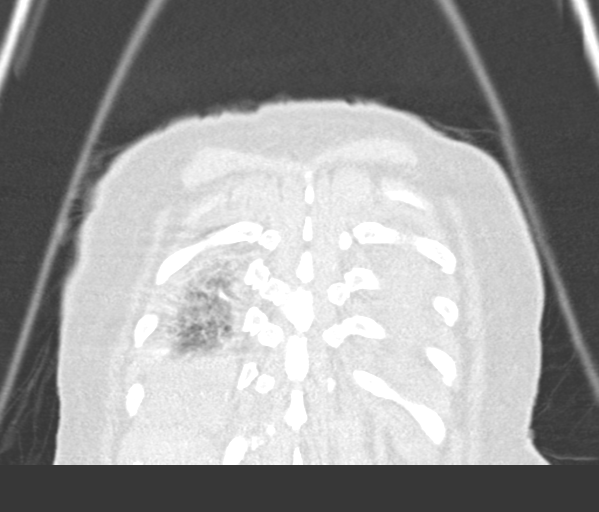
[im 52/129  lung]
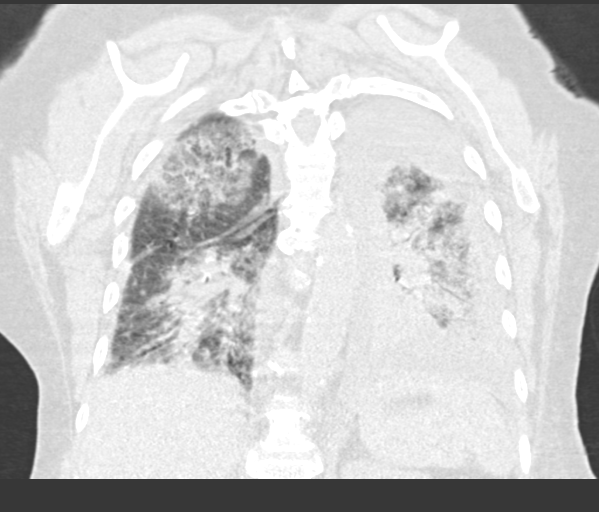
[im 77/129  lung]
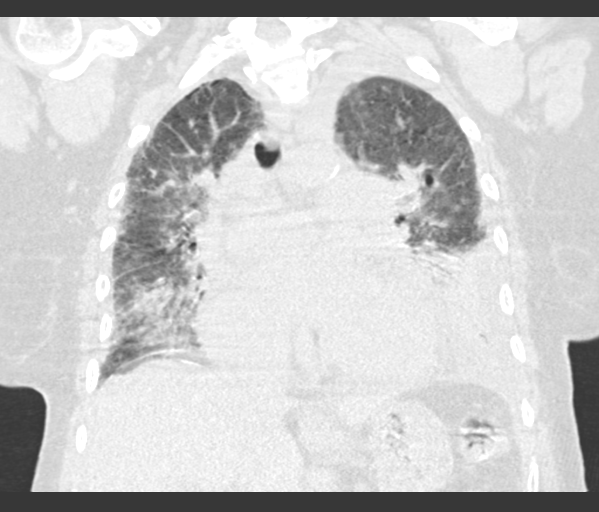

[15 of 36 positions shown; findings below may reference images not displayed]

FINDINGS: Cardiovascular: Atherosclerotic calcifications aorta, coronary
arteries, and proximal great vessels. Aorta normal caliber without
aneurysmal dilatation. Minimal pericardial effusion. Enlargement of
cardiac chambers especially the atria. RIGHT arm PICC line with tip
in SVC.

Mediastinum/Nodes: Enlarged RIGHT paratracheal node 2.3 cm image 48
previously 1.5 cm. Enlarged prevascular node 14 mm image 43, new.
Additional enlarged prevascular node 18 mm short axis image 49
previously 10 mm. Enlarged precarinal node 12 mm short axis image
53. Esophagus unremarkable. Base of cervical region normal
appearance.

Lungs/Pleura: Moderate LEFT pleural effusion. Small RIGHT pleural
effusion. Scattered respiratory motion artifacts. Extensive
infiltrates throughout both lungs which likely represent either
acute edema or infection superimposed upon a mild degree of
underlying chronic lung disease seen on the previous study.
Compressive atelectasis LEFT lower lobe. No pneumothorax.

Upper Abdomen: Unremarkable

Musculoskeletal: Unremarkable
IMPRESSION: Aortic atherosclerosis and coronary arterial calcification.

Enlargement of cardiac chambers especially atria.

BILATERAL pulmonary infiltrates likely represent combination of
superimposed edema or less likely infection on a background of mild
interstitial lung disease.

BILATERAL pleural effusions LEFT greater than RIGHT.

Increased mediastinal adenopathy since previous exam, question
reactive in the setting of acute infiltrates.

## 2017-05-30 IMAGING — DX DG CHEST 1V PORT
1 series · 1 of 1 positions shown · non-contrast
Comparison: Chest radiograph from one day prior.

CLINICAL DATA: Respiratory failure, intubated

EXAM:
PORTABLE CHEST 1 VIEW

[chest ap]
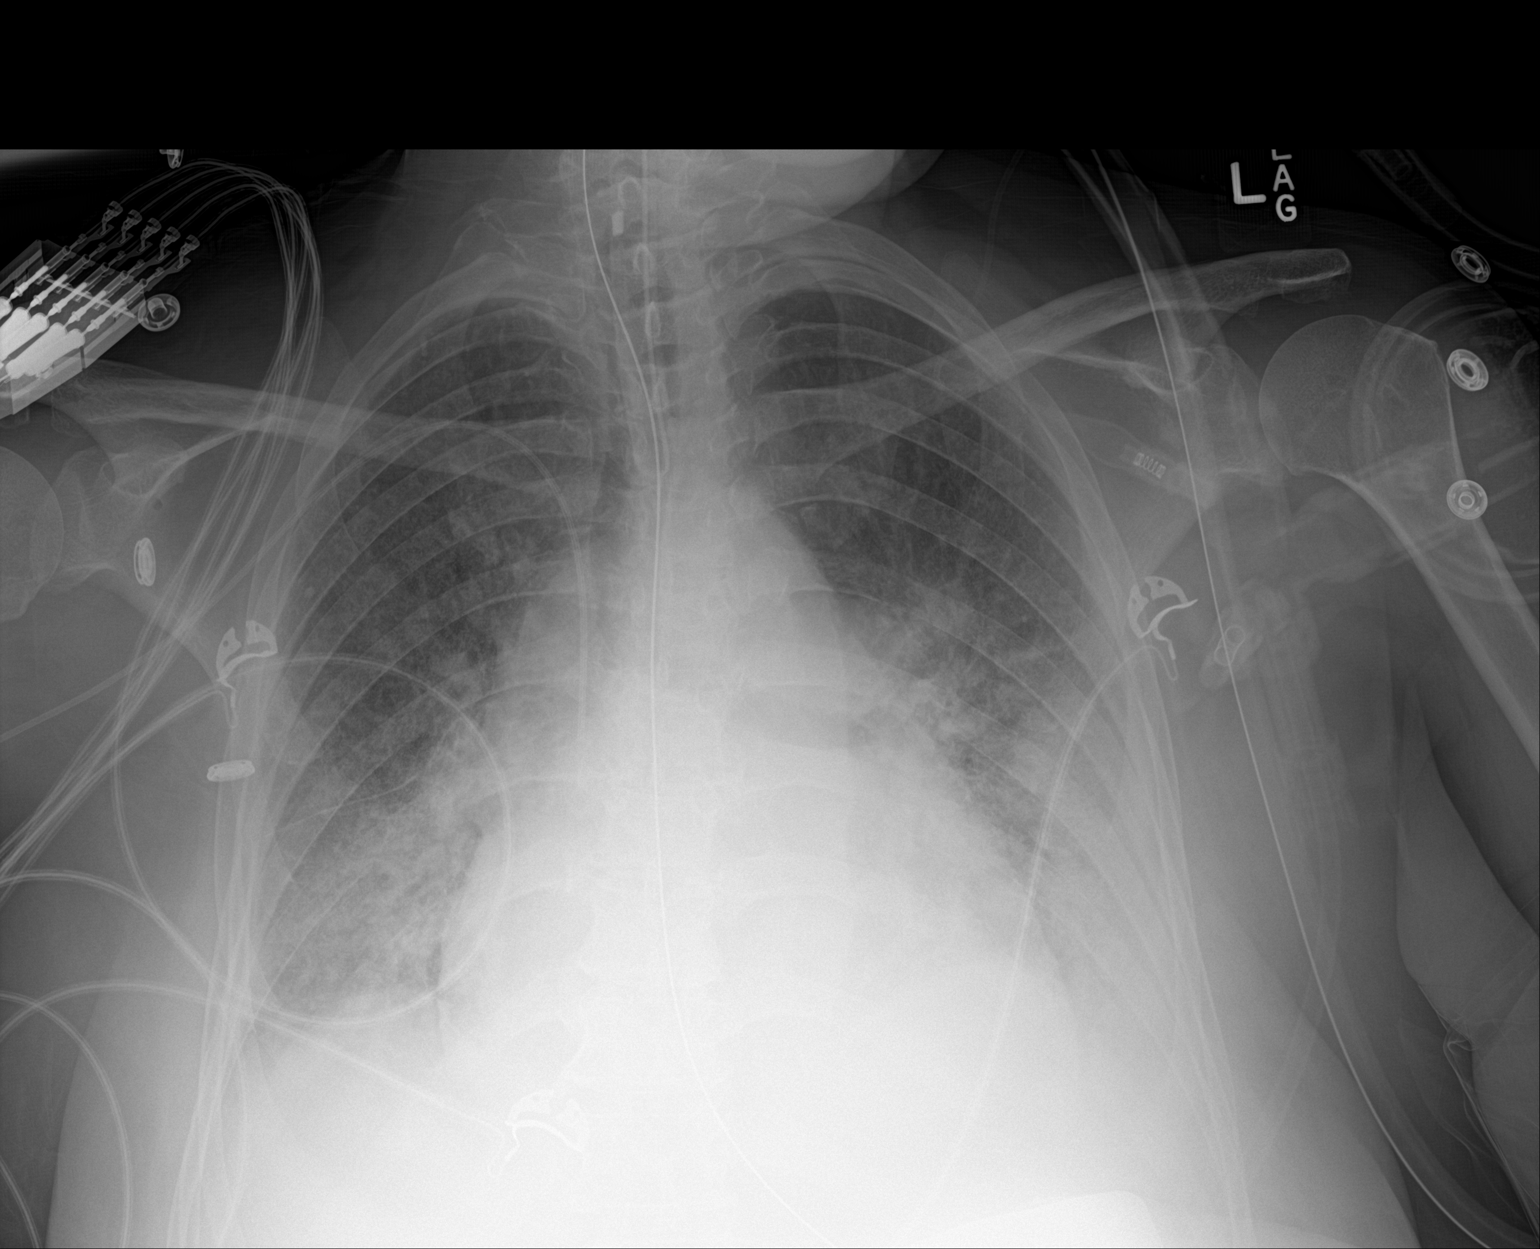

[1 of 1 positions shown; findings below may reference images not displayed]

FINDINGS: Endotracheal tube tip is 4.9 cm above the carina. Enteric tube
enters stomach with the tip not seen on this image. Right PICC
terminates in the lower third of the superior vena cava. Stable
cardiomediastinal silhouette with mild cardiomegaly. No
pneumothorax. Small bilateral pleural effusions appear stable.
Mild-to-moderate pulmonary edema appears slightly improved.
IMPRESSION: 1. Well-positioned support structures as detailed.
2. Mild-to-moderate congestive heart failure, slightly improved.
3. Stable small bilateral pleural effusions.
